# Patient Record
Sex: Female | Born: 1965 | Race: White | Hispanic: No | Marital: Married | Smoking: Never smoker
Health system: Southern US, Community
[De-identification: ages and names within clinical notes are randomized; demographics above are authoritative.]

## PROBLEM LIST (undated history)

## (undated) DIAGNOSIS — I10 Essential (primary) hypertension: Secondary | ICD-10-CM

## (undated) DIAGNOSIS — T7840XA Allergy, unspecified, initial encounter: Secondary | ICD-10-CM

## (undated) DIAGNOSIS — Z8619 Personal history of other infectious and parasitic diseases: Secondary | ICD-10-CM

## (undated) DIAGNOSIS — R9430 Abnormal result of cardiovascular function study, unspecified: Secondary | ICD-10-CM

## (undated) DIAGNOSIS — B977 Papillomavirus as the cause of diseases classified elsewhere: Secondary | ICD-10-CM

## (undated) DIAGNOSIS — E119 Type 2 diabetes mellitus without complications: Secondary | ICD-10-CM

## (undated) DIAGNOSIS — G4730 Sleep apnea, unspecified: Secondary | ICD-10-CM

## (undated) DIAGNOSIS — F988 Other specified behavioral and emotional disorders with onset usually occurring in childhood and adolescence: Secondary | ICD-10-CM

## (undated) DIAGNOSIS — F32A Depression, unspecified: Secondary | ICD-10-CM

## (undated) DIAGNOSIS — F329 Major depressive disorder, single episode, unspecified: Secondary | ICD-10-CM

## (undated) DIAGNOSIS — M8580 Other specified disorders of bone density and structure, unspecified site: Secondary | ICD-10-CM

## (undated) DIAGNOSIS — O24419 Gestational diabetes mellitus in pregnancy, unspecified control: Secondary | ICD-10-CM

## (undated) DIAGNOSIS — IMO0002 Reserved for concepts with insufficient information to code with codable children: Secondary | ICD-10-CM

## (undated) DIAGNOSIS — E785 Hyperlipidemia, unspecified: Secondary | ICD-10-CM

## (undated) DIAGNOSIS — J189 Pneumonia, unspecified organism: Secondary | ICD-10-CM

## (undated) DIAGNOSIS — R202 Paresthesia of skin: Secondary | ICD-10-CM

## (undated) DIAGNOSIS — R200 Anesthesia of skin: Secondary | ICD-10-CM

## (undated) HISTORY — DX: Essential (primary) hypertension: I10

## (undated) HISTORY — DX: Sleep apnea, unspecified: G47.30

## (undated) HISTORY — DX: Hyperlipidemia, unspecified: E78.5

## (undated) HISTORY — DX: Major depressive disorder, single episode, unspecified: F32.9

## (undated) HISTORY — DX: Papillomavirus as the cause of diseases classified elsewhere: B97.7

## (undated) HISTORY — DX: Anesthesia of skin: R20.0

## (undated) HISTORY — DX: Allergy, unspecified, initial encounter: T78.40XA

## (undated) HISTORY — DX: Anesthesia of skin: R20.2

## (undated) HISTORY — DX: Other specified behavioral and emotional disorders with onset usually occurring in childhood and adolescence: F98.8

## (undated) HISTORY — DX: Personal history of other infectious and parasitic diseases: Z86.19

## (undated) HISTORY — DX: Abnormal result of cardiovascular function study, unspecified: R94.30

## (undated) HISTORY — DX: Other specified disorders of bone density and structure, unspecified site: M85.80

## (undated) HISTORY — DX: Reserved for concepts with insufficient information to code with codable children: IMO0002

## (undated) HISTORY — DX: Depression, unspecified: F32.A

---

## 1988-09-30 HISTORY — PX: SHOULDER ARTHROSCOPY W/ ROTATOR CUFF REPAIR: SHX2400

## 1997-11-03 ENCOUNTER — Ambulatory Visit (HOSPITAL_COMMUNITY): Admission: RE | Admit: 1997-11-03 | Discharge: 1997-11-03 | Payer: Self-pay | Admitting: Obstetrics and Gynecology

## 1997-11-24 ENCOUNTER — Inpatient Hospital Stay (HOSPITAL_COMMUNITY): Admission: AD | Admit: 1997-11-24 | Discharge: 1997-11-24 | Payer: Self-pay | Admitting: Obstetrics and Gynecology

## 1997-11-25 ENCOUNTER — Inpatient Hospital Stay (HOSPITAL_COMMUNITY): Admission: AD | Admit: 1997-11-25 | Discharge: 1997-11-25 | Payer: Self-pay | Admitting: Obstetrics and Gynecology

## 1997-12-04 ENCOUNTER — Encounter: Admission: RE | Admit: 1997-12-04 | Discharge: 1998-03-04 | Payer: Self-pay | Admitting: Obstetrics and Gynecology

## 1997-12-10 ENCOUNTER — Inpatient Hospital Stay (HOSPITAL_COMMUNITY): Admission: AD | Admit: 1997-12-10 | Discharge: 1997-12-10 | Payer: Self-pay | Admitting: Obstetrics and Gynecology

## 1997-12-17 ENCOUNTER — Inpatient Hospital Stay (HOSPITAL_COMMUNITY): Admission: AD | Admit: 1997-12-17 | Discharge: 1997-12-21 | Payer: Self-pay | Admitting: Obstetrics and Gynecology

## 1997-12-21 ENCOUNTER — Encounter (HOSPITAL_COMMUNITY): Admission: RE | Admit: 1997-12-21 | Discharge: 1998-03-21 | Payer: Self-pay | Admitting: Obstetrics and Gynecology

## 1999-02-02 ENCOUNTER — Other Ambulatory Visit: Admission: RE | Admit: 1999-02-02 | Discharge: 1999-02-02 | Payer: Self-pay | Admitting: Obstetrics and Gynecology

## 1999-11-23 ENCOUNTER — Other Ambulatory Visit: Admission: RE | Admit: 1999-11-23 | Discharge: 1999-11-23 | Payer: Self-pay | Admitting: Obstetrics and Gynecology

## 2000-03-08 ENCOUNTER — Ambulatory Visit (HOSPITAL_COMMUNITY): Admission: RE | Admit: 2000-03-08 | Discharge: 2000-03-08 | Payer: Self-pay | Admitting: Obstetrics and Gynecology

## 2000-03-08 ENCOUNTER — Encounter: Payer: Self-pay | Admitting: Obstetrics and Gynecology

## 2000-05-15 ENCOUNTER — Inpatient Hospital Stay (HOSPITAL_COMMUNITY): Admission: AD | Admit: 2000-05-15 | Discharge: 2000-05-18 | Payer: Self-pay | Admitting: Obstetrics and Gynecology

## 2000-06-26 ENCOUNTER — Other Ambulatory Visit: Admission: RE | Admit: 2000-06-26 | Discharge: 2000-06-26 | Payer: Self-pay | Admitting: Obstetrics and Gynecology

## 2001-07-01 ENCOUNTER — Other Ambulatory Visit: Admission: RE | Admit: 2001-07-01 | Discharge: 2001-07-01 | Payer: Self-pay | Admitting: Obstetrics and Gynecology

## 2002-07-31 ENCOUNTER — Other Ambulatory Visit: Admission: RE | Admit: 2002-07-31 | Discharge: 2002-07-31 | Payer: Self-pay | Admitting: Obstetrics and Gynecology

## 2003-09-24 ENCOUNTER — Other Ambulatory Visit: Admission: RE | Admit: 2003-09-24 | Discharge: 2003-09-24 | Payer: Self-pay | Admitting: Obstetrics and Gynecology

## 2003-12-08 ENCOUNTER — Ambulatory Visit: Payer: Self-pay | Admitting: Internal Medicine

## 2003-12-23 ENCOUNTER — Ambulatory Visit: Payer: Self-pay | Admitting: Family Medicine

## 2004-02-02 ENCOUNTER — Ambulatory Visit: Payer: Self-pay | Admitting: Otolaryngology

## 2004-10-13 ENCOUNTER — Other Ambulatory Visit: Admission: RE | Admit: 2004-10-13 | Discharge: 2004-10-13 | Payer: Self-pay | Admitting: Obstetrics and Gynecology

## 2005-04-19 ENCOUNTER — Ambulatory Visit: Payer: Self-pay | Admitting: Family Medicine

## 2005-05-01 ENCOUNTER — Ambulatory Visit: Payer: Self-pay | Admitting: Family Medicine

## 2005-07-19 ENCOUNTER — Ambulatory Visit: Payer: Self-pay | Admitting: Family Medicine

## 2005-09-25 ENCOUNTER — Ambulatory Visit: Payer: Self-pay | Admitting: Family Medicine

## 2005-10-10 ENCOUNTER — Ambulatory Visit: Payer: Self-pay | Admitting: Family Medicine

## 2005-11-15 ENCOUNTER — Ambulatory Visit: Payer: Self-pay | Admitting: Family Medicine

## 2005-12-15 ENCOUNTER — Ambulatory Visit: Payer: Self-pay | Admitting: Family Medicine

## 2006-08-20 ENCOUNTER — Emergency Department (HOSPITAL_COMMUNITY): Admission: EM | Admit: 2006-08-20 | Discharge: 2006-08-20 | Payer: Self-pay | Admitting: Emergency Medicine

## 2006-08-20 IMAGING — CR DG CHEST 1V PORT
1 series · 1 of 1 positions shown · non-contrast
Comparison: None.

CLINICAL DATA: Chest pain, nausea, and chest tightness.
 PORTABLE CHEST - 1 VIEW:

[view not recorded]
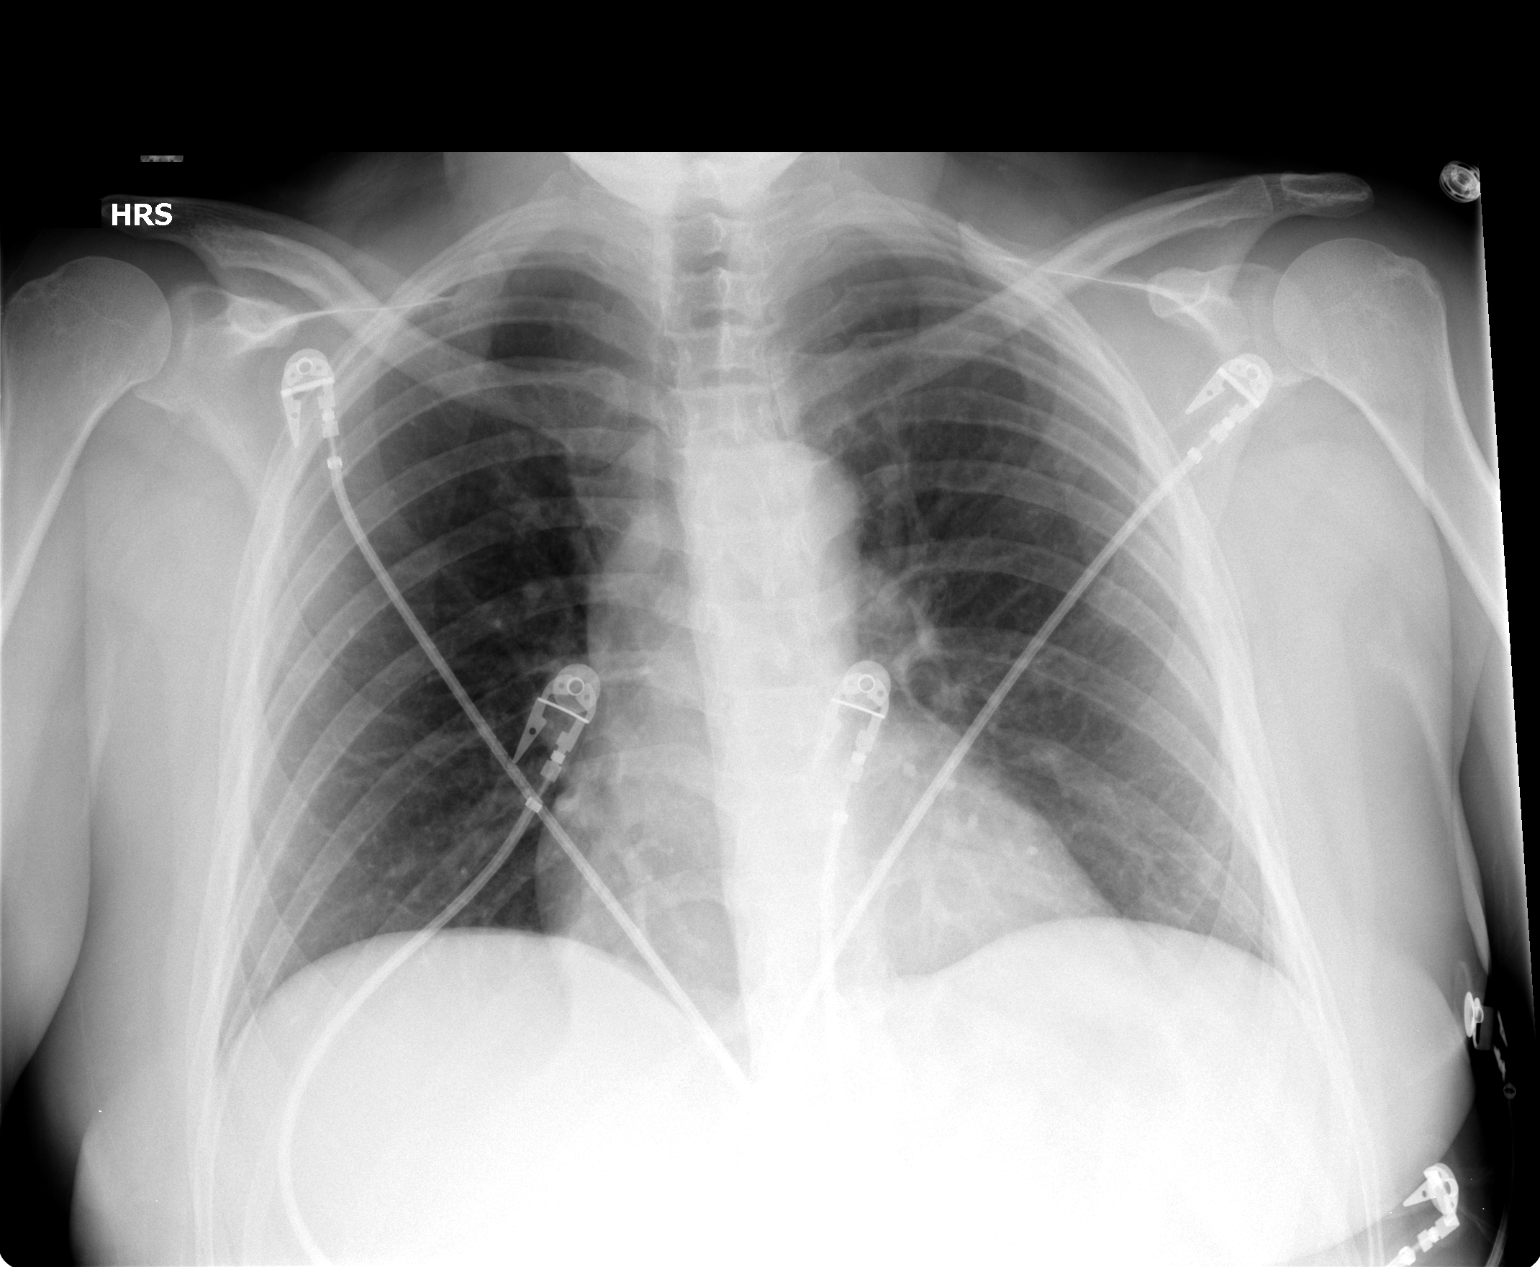

[1 of 1 positions shown; findings below may reference images not displayed]

FINDINGS: The trachea is midline. The heart size is accentuated by technique. The lungs are clear. No pleural fluid.
IMPRESSION: No acute findings.

## 2006-08-21 ENCOUNTER — Telehealth (INDEPENDENT_AMBULATORY_CARE_PROVIDER_SITE_OTHER): Payer: Self-pay | Admitting: Internal Medicine

## 2006-08-30 ENCOUNTER — Ambulatory Visit: Payer: Self-pay | Admitting: Cardiology

## 2006-08-30 LAB — CONVERTED CEMR LAB
Cholesterol: 238 mg/dL (ref 0–200)
Direct LDL: 162.1 mg/dL
HDL: 51.5 mg/dL (ref >39.0)
Total CHOL/HDL Ratio: 4.6
Triglycerides: 111 mg/dL (ref 0–149)
VLDL: 22 mg/dL (ref 0–40)

## 2006-08-31 ENCOUNTER — Ambulatory Visit: Payer: Self-pay

## 2006-08-31 ENCOUNTER — Encounter (INDEPENDENT_AMBULATORY_CARE_PROVIDER_SITE_OTHER): Payer: Self-pay | Admitting: Internal Medicine

## 2006-09-06 ENCOUNTER — Ambulatory Visit: Payer: Self-pay | Admitting: Cardiology

## 2006-09-25 ENCOUNTER — Ambulatory Visit: Payer: Self-pay | Admitting: Family Medicine

## 2006-09-25 DIAGNOSIS — F3289 Other specified depressive episodes: Secondary | ICD-10-CM | POA: Insufficient documentation

## 2006-09-25 DIAGNOSIS — J309 Allergic rhinitis, unspecified: Secondary | ICD-10-CM | POA: Insufficient documentation

## 2006-09-25 DIAGNOSIS — F329 Major depressive disorder, single episode, unspecified: Secondary | ICD-10-CM | POA: Insufficient documentation

## 2006-10-02 ENCOUNTER — Ambulatory Visit: Payer: Self-pay | Admitting: Licensed Clinical Social Worker

## 2006-10-30 ENCOUNTER — Encounter: Admission: RE | Admit: 2006-10-30 | Discharge: 2006-10-30 | Payer: Self-pay | Admitting: Obstetrics and Gynecology

## 2006-10-30 IMAGING — US UNKNOWN PR STUDY
1 series · 2 of 2 positions shown · non-contrast
Comparison: none

DG DIAGNOSTIC BILATERAL
Bilateral CC and MLO view(s) were taken.

RIGHT BREAST ULTRASOUND
DIGITAL BILATERAL DIAGNOSTIC MAMMOGRAM WITH CAD AND RIGHT BREAST ULTRASOUND:
CLINICAL DATA: Patient was feeling tenderness around the right nipple at the time of her recent 
physical exam.  Her physician palpated a lump at approximately 10-12 o'clock near the nipple.  The 
patient does not feel a lump today.

[Series 1: unknown pr study · 2 of 2 slices shown]
[im 1/2]
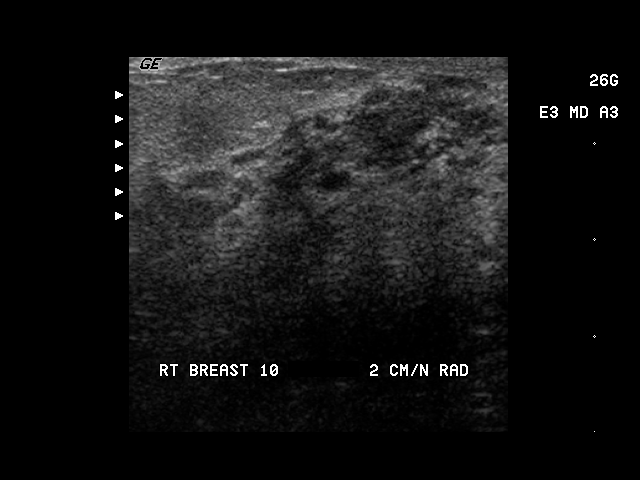
[im 2/2]
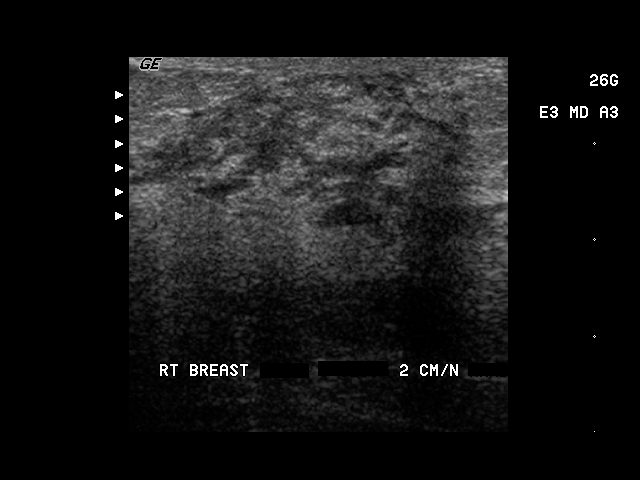

[2 of 2 positions shown; findings below may reference images not displayed]

heterogeneously dense.  There is no dominant mass, architectural distortion or calcification to 
suggest malignancy.

On physical examination, no mass is palpated in the right periareolar region from 10-12 o'clock.  
Sonography demonstrates dense fibroglandular tissue with no mass, distortion or shadowing to 
suggest malignancy.
IMPRESSION: No mammographic or sonographic evidence of malignancy.  Yearly screening mammography is suggested.

ASSESSMENT: Negative - BI-RADS 1

Screening mammogram of both breasts in 1 year.
ANALYZED BY COMPUTER AIDED DETECTION. ,

## 2006-10-30 IMAGING — MG MM DIAGNOSTIC BILATERAL
4 series · 4 of 4 positions shown · non-contrast
Comparison: none

DG DIAGNOSTIC BILATERAL
Bilateral CC and MLO view(s) were taken.

RIGHT BREAST ULTRASOUND
DIGITAL BILATERAL DIAGNOSTIC MAMMOGRAM WITH CAD AND RIGHT BREAST ULTRASOUND:
CLINICAL DATA: Patient was feeling tenderness around the right nipple at the time of her recent 
physical exam.  Her physician palpated a lump at approximately 10-12 o'clock near the nipple.  The 
patient does not feel a lump today.

[R CC]
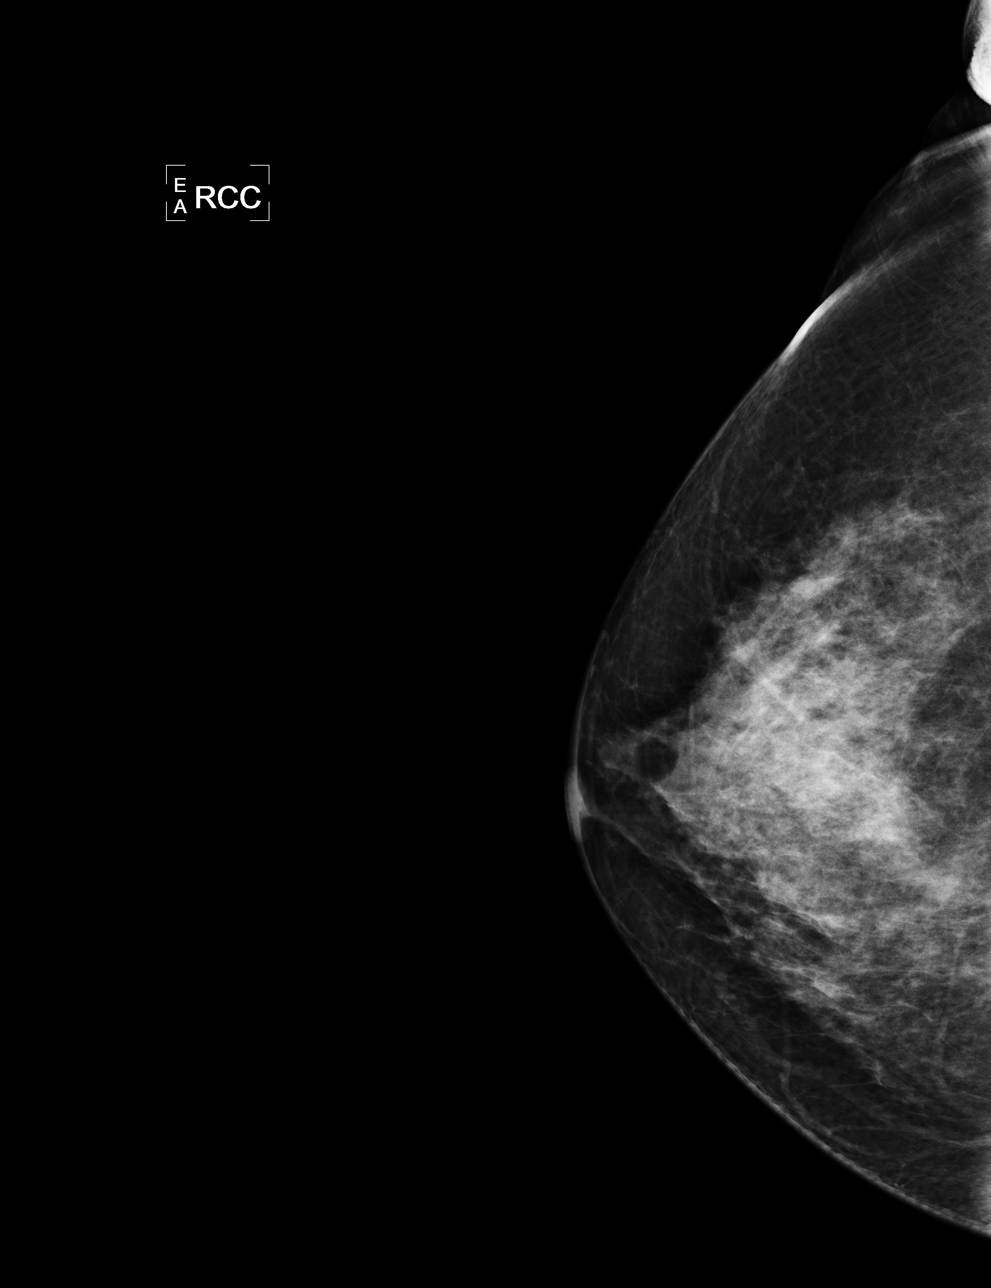

[L CC]
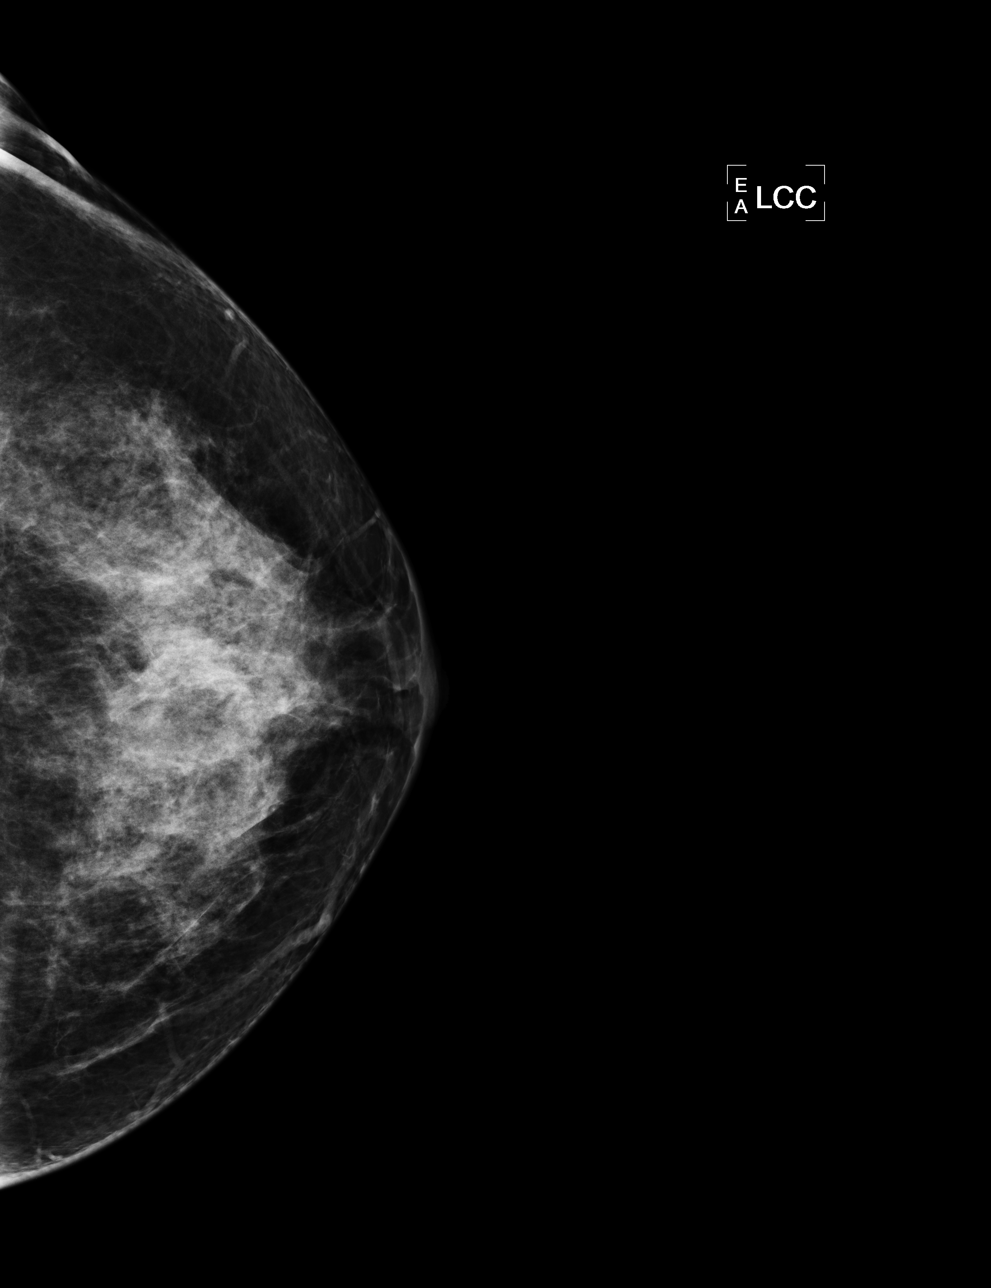

[L MLO]
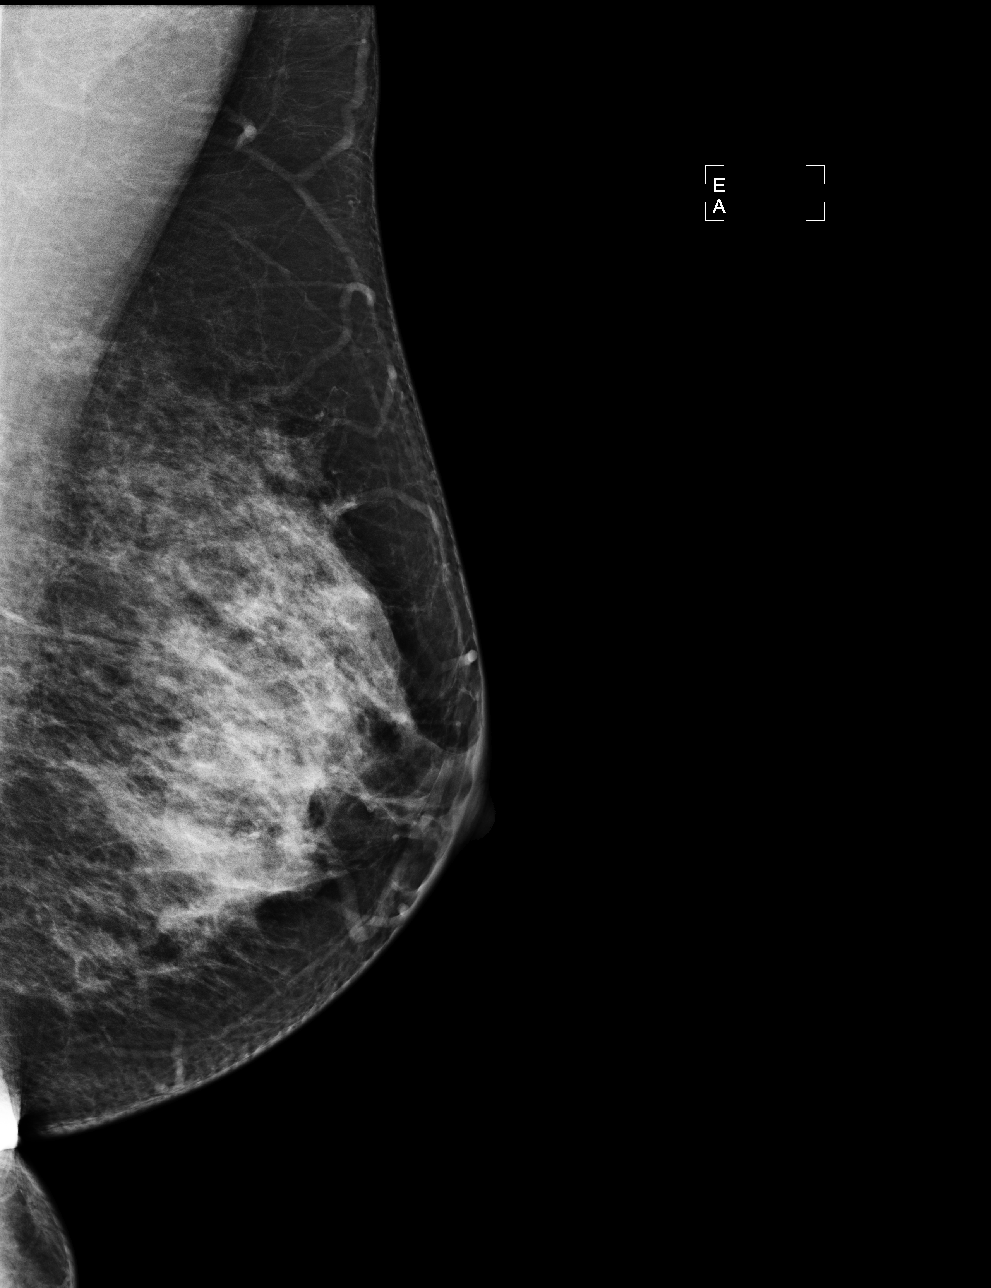

[R MLO]
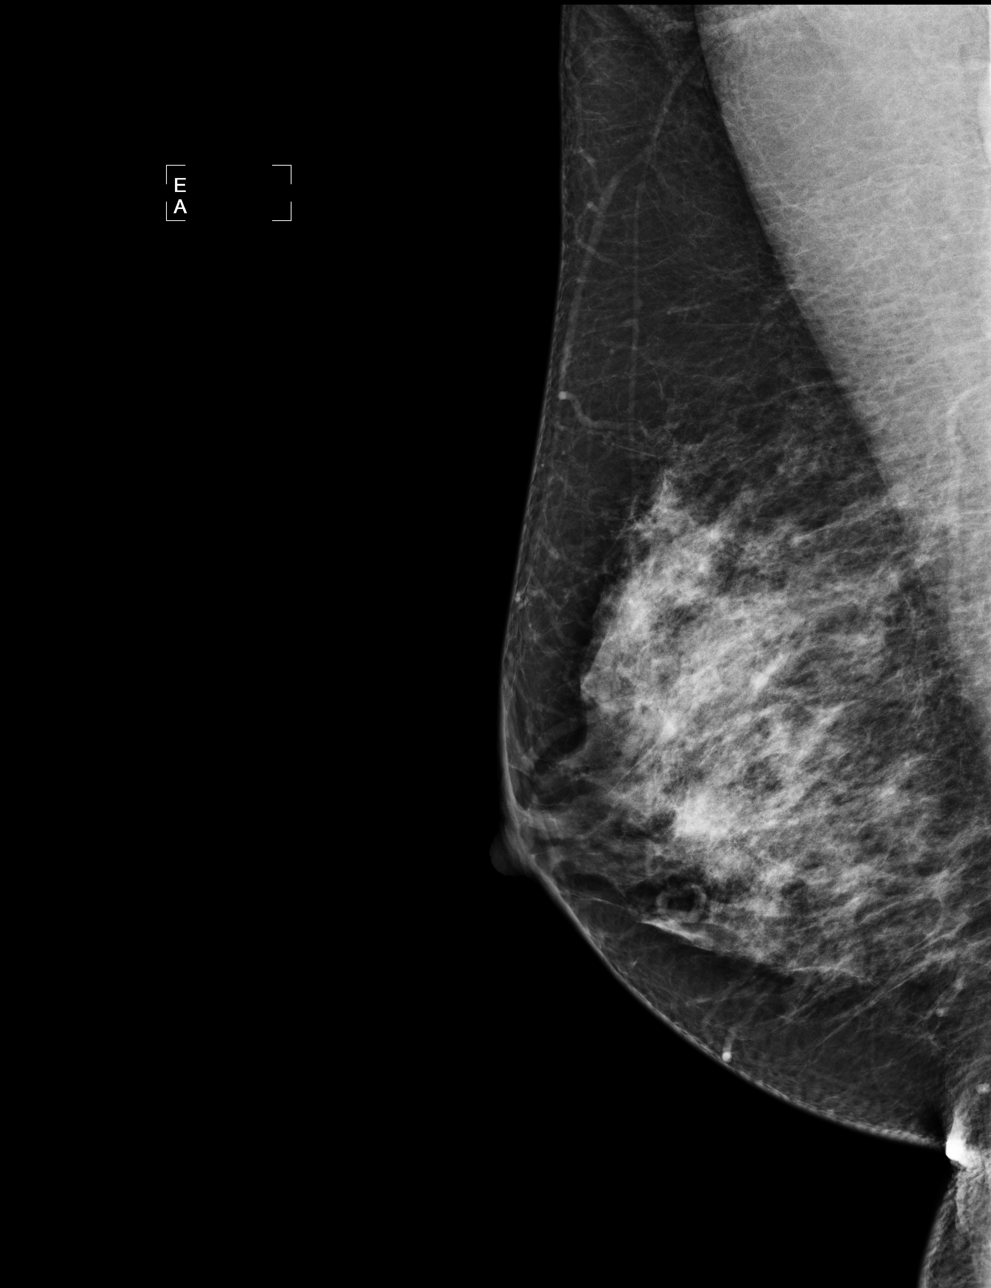

[4 of 4 positions shown; findings below may reference images not displayed]

heterogeneously dense.  There is no dominant mass, architectural distortion or calcification to 
suggest malignancy.

On physical examination, no mass is palpated in the right periareolar region from 10-12 o'clock.  
Sonography demonstrates dense fibroglandular tissue with no mass, distortion or shadowing to 
suggest malignancy.
IMPRESSION: No mammographic or sonographic evidence of malignancy.  Yearly screening mammography is suggested.

ASSESSMENT: Negative - BI-RADS 1

Screening mammogram of both breasts in 1 year.
ANALYZED BY COMPUTER AIDED DETECTION. ,

## 2006-12-26 ENCOUNTER — Ambulatory Visit: Payer: Self-pay | Admitting: Family Medicine

## 2006-12-26 ENCOUNTER — Encounter (INDEPENDENT_AMBULATORY_CARE_PROVIDER_SITE_OTHER): Payer: Self-pay | Admitting: Internal Medicine

## 2007-05-06 DIAGNOSIS — R195 Other fecal abnormalities: Secondary | ICD-10-CM | POA: Insufficient documentation

## 2007-10-15 ENCOUNTER — Encounter (INDEPENDENT_AMBULATORY_CARE_PROVIDER_SITE_OTHER): Payer: Self-pay | Admitting: Internal Medicine

## 2008-11-19 ENCOUNTER — Ambulatory Visit: Payer: Self-pay | Admitting: Family Medicine

## 2010-01-02 ENCOUNTER — Emergency Department (HOSPITAL_COMMUNITY)
Admission: EM | Admit: 2010-01-02 | Discharge: 2010-01-02 | Payer: Self-pay | Source: Home / Self Care | Admitting: Emergency Medicine

## 2010-01-02 IMAGING — CR DG CHEST 2V
2 series · 2 of 2 positions shown · non-contrast
Comparison: [DATE]

CLINICAL DATA: Shortness of breath.  Cough, cold and congestion.
Fevers.

CHEST - 2 VIEW

[w chest pa]
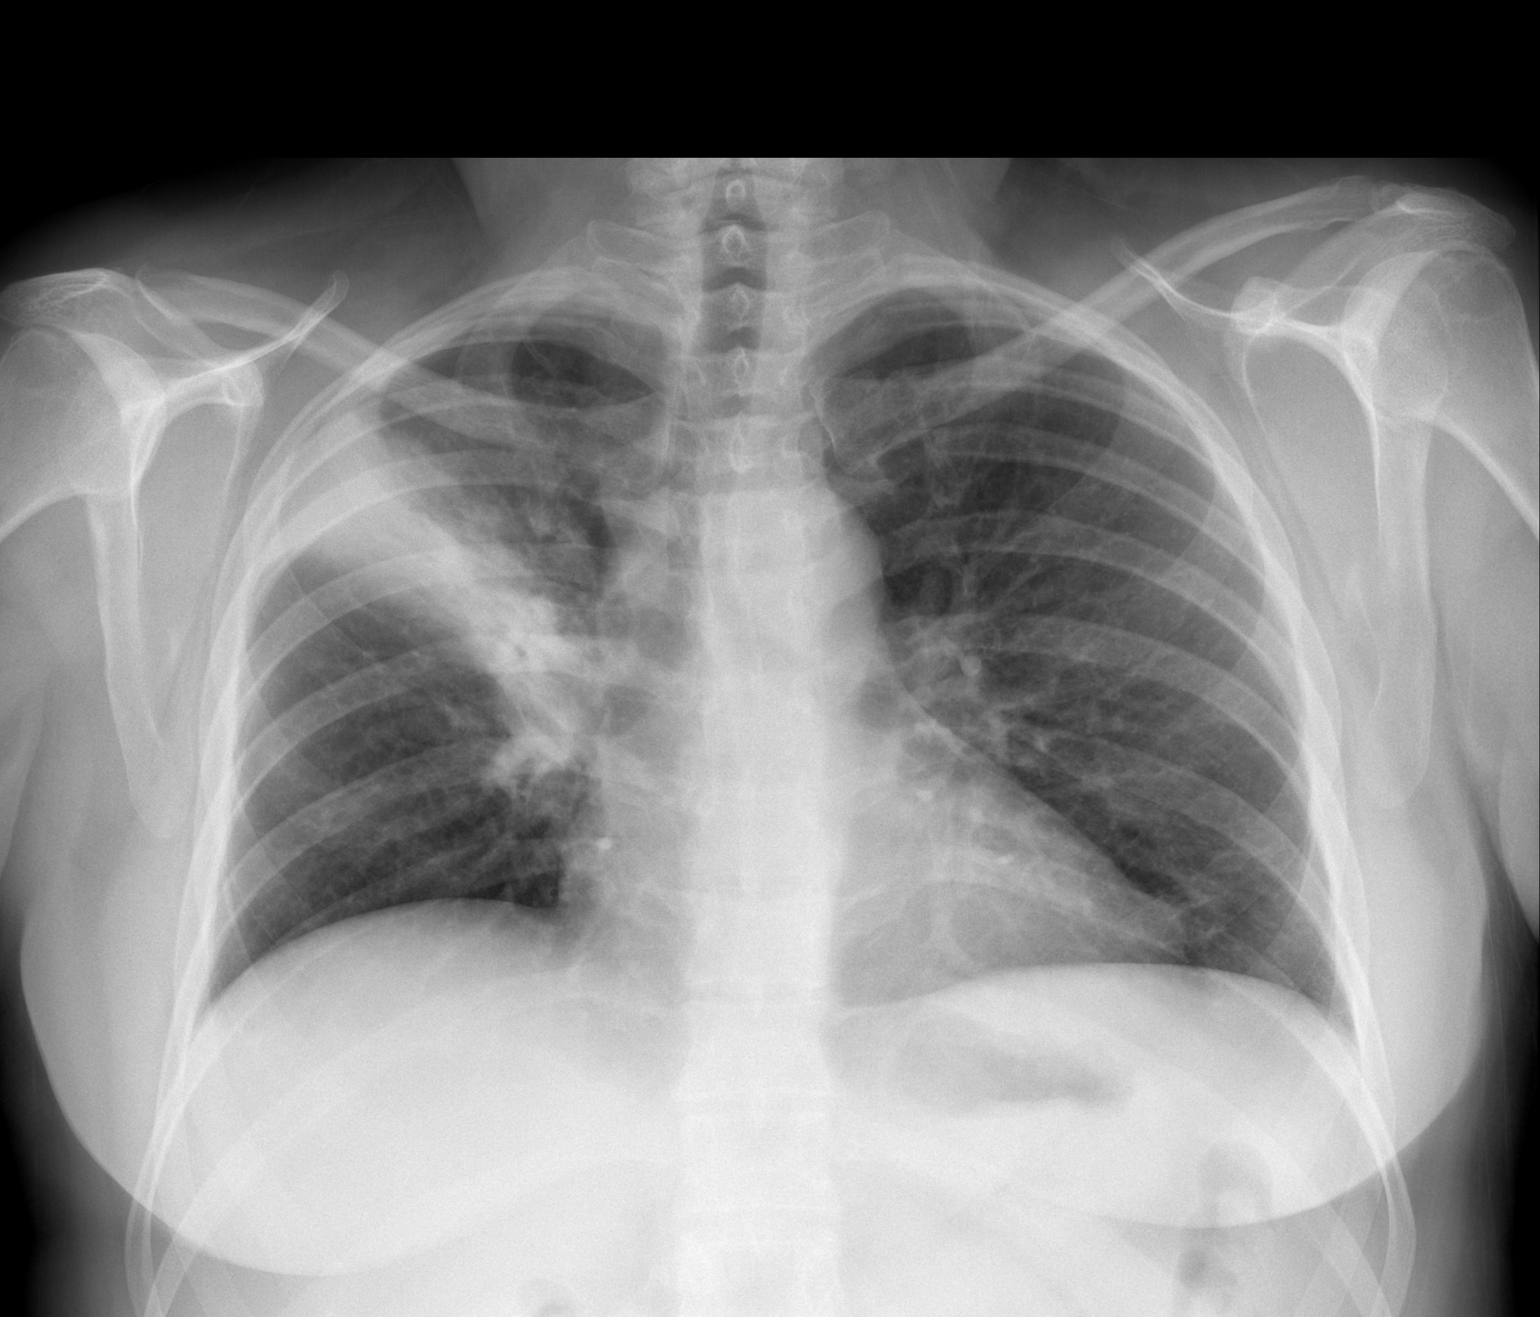

[w chest lat]
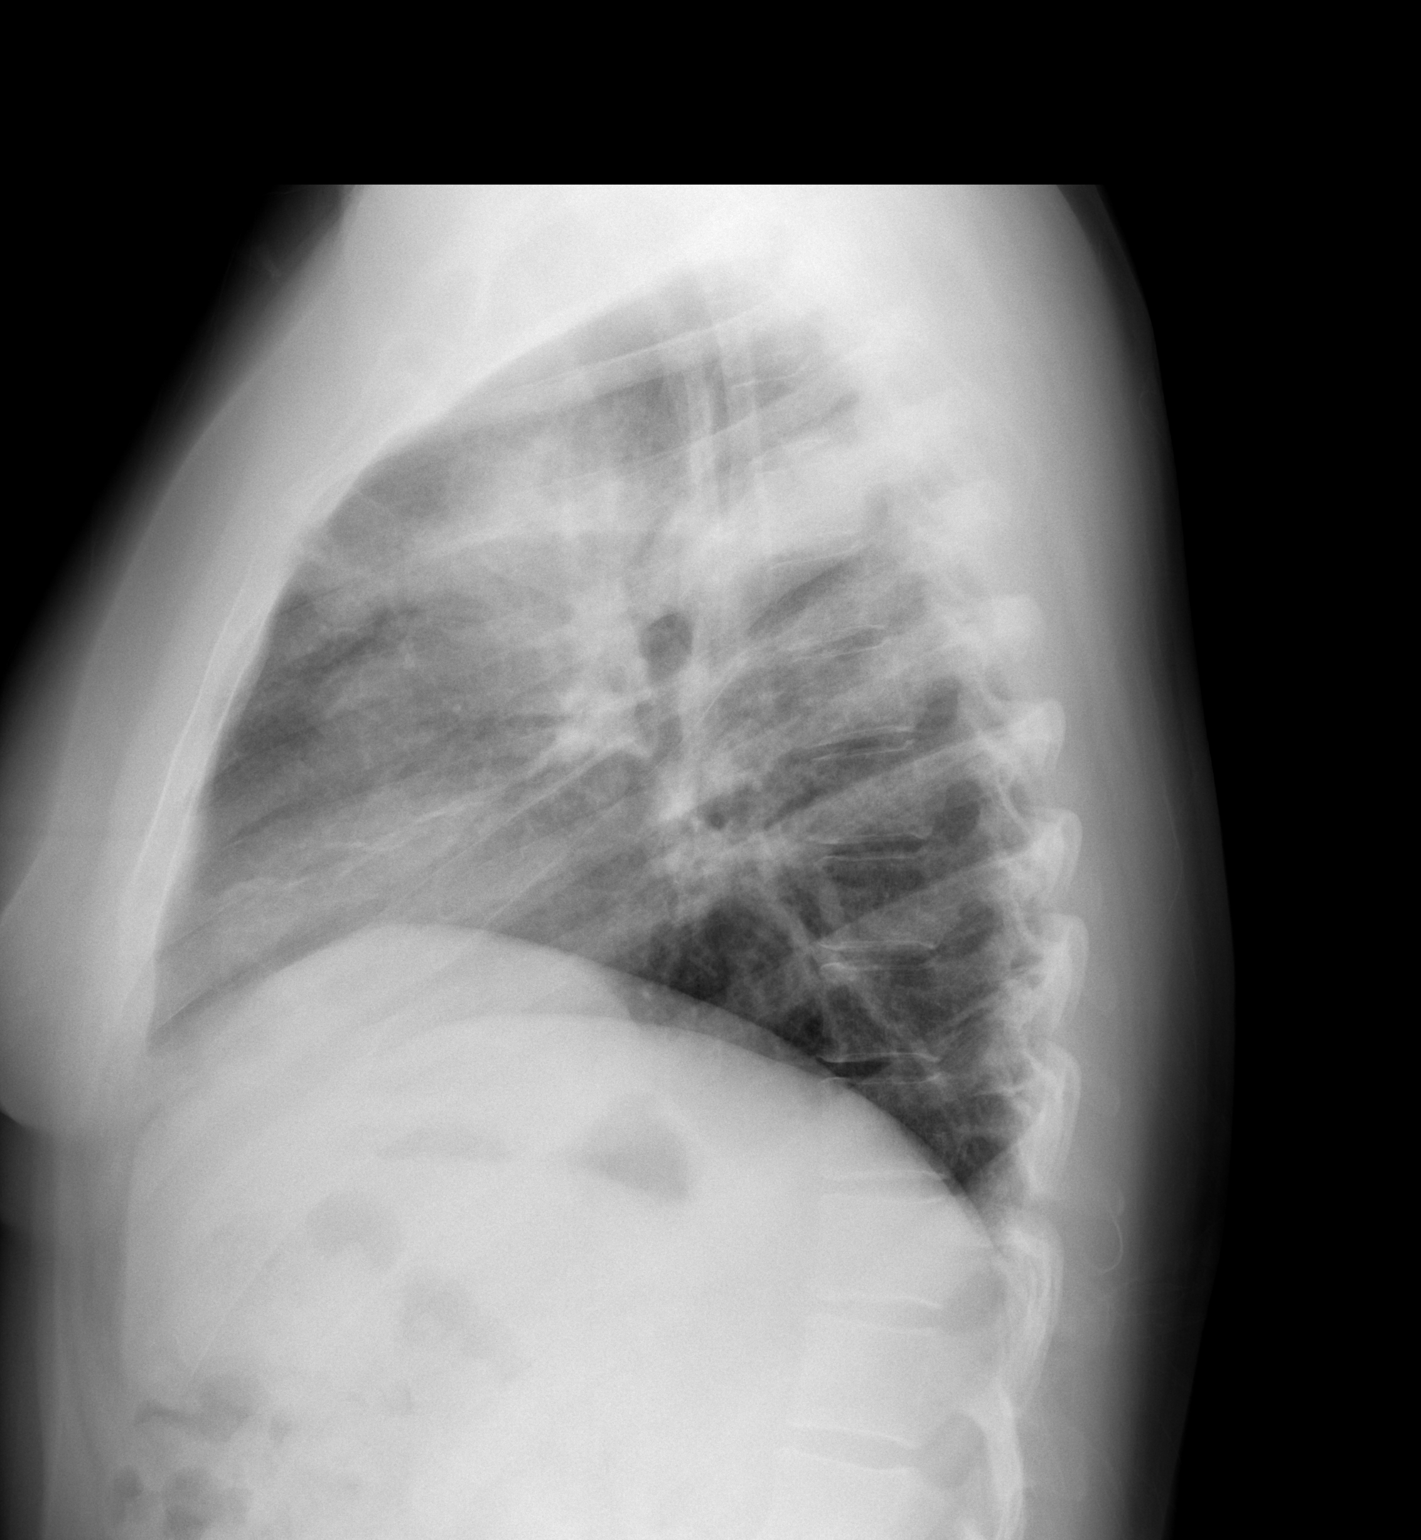

[2 of 2 positions shown; findings below may reference images not displayed]

FINDINGS: Heart size is normal.

No pleural effusion or pulmonary edema identified.

There is dense airspace consolidation identified within the right
upper lobe.

The left lung is clear.
IMPRESSION: 1.  Dense right upper lobe airspace consolidation consistent with
pneumonia.
2.  Suggest follow up imaging to ensure resolution.

## 2010-01-30 DIAGNOSIS — J189 Pneumonia, unspecified organism: Secondary | ICD-10-CM

## 2010-01-30 HISTORY — DX: Pneumonia, unspecified organism: J18.9

## 2010-03-03 NOTE — Progress Notes (Signed)
Summary: request your call  Phone Note Call from Patient Call back at Home Phone [phone] Call back at cell [phone]   Caller: Patient Call For: bean Summary of Call: pt want to speake with you, she went to er last night for cp. pt wants to know if you can refer her to her fathers cardiologist dr Myrtis Ser. Initial call taken by: Liane Comber,  August 21, 2006 9:47 AM  Follow-up for Phone Call        need her chart and get ER notes -------------------------- Was seen 7/21 /08 at [facility] for chest pain of unknown etiiology.  Has  increased risks of CAD due to family hx.    Can refer to Dr Myrtis Ser, cardio for eval -----------noted Follow-up by: Gildardo Griffes FNP,  August 21, 2006 1:40 PM  Additional Follow-up for Phone Call Additional follow up Details #1::        PT HAD ALREADY MADE HER OWN APPT WITH DR Myrtis Ser FOR AUGUST 27TH  AT 10:15. NO SOONER APPTS ARE AVAILABLE. ...................................................................Carlton Adam  August 22, 2006 11:23 AM  Additional Follow-up by: Carlton Adam,  August 22, 2006 11:23 AM

## 2010-03-03 NOTE — Assessment & Plan Note (Signed)
Summary: FLU SHOT/BILLIE/CLE  Nurse Visit    Prior Medications: ZYRTEC ALLERGY 10 MG  TABS (CETIRIZINE HCL) 1 qd Current Allergies: ! SULFA   Influenza Vaccine    Vaccine Type: Fluvax 3+    Site: left deltoid    Mfr: Sanofi Pasteur    Dose: 0.5 ml    Route: IM    Given by: Cooper Render    Exp. Date: 07/30/2007    Lot #: [id number]    VIS given: 08/23/06 version given December 26, 2006.  Flu Vaccine Consent Questions    Do you have a history of severe allergic reactions to this vaccine? no    Any prior history of allergic reactions to egg and/or gelatin? no    Do you have a sensitivity to the preservative Thimersol? no    Do you have a past history of Guillan-Barre Syndrome? no    Do you currently have an acute febrile illness? no    Have you ever had a severe reaction to latex? no    Vaccine information given and explained to patient? yes    Are you currently pregnant? no   Orders Added: 1)  Flu Vaccine 23yrs + [90658] 2)  Admin 1st Vaccine [email or web address]    ]

## 2010-03-03 NOTE — Assessment & Plan Note (Signed)
Summary: Lindsay Rios - FLU SHOT / LFW  Nurse Visit   Allergies: 1)  ! Sulfa  Immunizations Administered:  Influenza Vaccine # 1:    Vaccine Type: Fluvax 3+    Site: left deltoid    Mfr: GlaxoSmithKline    Dose: 0.5 ml    Route: IM    Given by: Mervin Hack CMA (AAMA)    Exp. Date: 07/29/2009    Lot #: [id number]    VIS given: 08/23/06 version given November 19, 2008.  Flu Vaccine Consent Questions:    Do you have a history of severe allergic reactions to this vaccine? no    Any prior history of allergic reactions to egg and/or gelatin? no    Do you have a sensitivity to the preservative Thimersol? no    Do you have a past history of Guillan-Barre Syndrome? no    Do you currently have an acute febrile illness? no    Have you ever had a severe reaction to latex? no    Vaccine information given and explained to patient? yes    Are you currently pregnant? no  Orders Added: 1)  Flu Vaccine 80yrs + [90658] 2)  Admin 1st Vaccine [08657]

## 2010-03-03 NOTE — Assessment & Plan Note (Signed)
Summary: JAW PAIN,THROAT/CLE   Vital Signs:  Patient Profile:   45 Years Old Female Weight:      158 pounds Temp:     98.3 degrees F oral Pulse rate:   67 / minute BP sitting:   143 / 106  (right arm)  Vitals Entered By: Cooper Render (September 25, 2006 12:24 PM)               Chief Complaint:  jaw pain and increased stress.  History of Present Illness: Here due to increased stress at home, marital problems since 2/08.  GYN prescribed Effexor 4-5/08 which made her drunk, so D/Ced.  Took Lexapro x 1 mo--too expensive.  Took Celexa  ~2-3wks, stopped due to mother's neg comments. " I'm about a the end of my rope."  Agrees to counseling.  Has regained most of the 20 lbs that she had talken off over the  last 6 mo or so.  Having pain in both jaws on and off.  Son told her that she is snoring loudly.  Feels like something in her throat.  Not aware of grinding her teeth, dentist did not comment last visit.  Has used Claritin in lthe past--helped.  Has been on Zyrtec most recently--helped.  Wants Rx--agreed to generic.  Understands that the insurance co may not cover.  Current Allergies (reviewed today): ! SULFA     Review of Systems      See HPI   Physical Exam  General:     alert, well-developed, well-nourished, and well-hydrated.   Head:     normocephalic and atraumatic.   Eyes:     pupils equal, pupils round, and no injection.   Ears:     TMs retracted wit increased fluid Nose:     boggy and edematus Mouth:     injected, no exudate Lungs:     normal respiratory effort, normal breath sounds, no crackles, and no wheezes.   Neurologic:     alert & oriented X3, sensation intact to light touch, and gait normal.   Skin:     turgor normal and color normal.   Axillary Nodes:     no R axillary adenopathy and no L axillary adenopathy.   Psych:     normally interactive, good eye contact, not anxious appearing, and not depressed appearing.      Impression &  Recommendations:  Problem # 1:  ALLERGIC RHINITIS (ICD-477.9) Assessment: New  Her updated medication list for this problem includes:    Zyrtec Allergy 10 Mg Tabs (Cetirizine hcl) .Marland Kitchen... 1 once daily--generic   Problem # 2:  DISORDER, DEPRESSIVE NEC (ICD-311) Assessment: New refer to Laruth Bouchard will discuss addition of med--I will be notified  Complete Medication List: 1)  Zyrtec Allergy 10 Mg Tabs (Cetirizine hcl) .Marland Kitchen.. 1 qd   Patient Instructions: 1)  refer to Judithe Modest    Prescriptions: ZYRTEC ALLERGY 10 MG  TABS (CETIRIZINE HCL) 1 qd  #30 x 6   Entered and Authorized by:   Gildardo Griffes FNP   Signed by:   Gildardo Griffes FNP on 09/25/2006   Method used:   Print then Give to Patient   RxID:   [id number]

## 2010-03-03 NOTE — Consult Note (Signed)
Summary: [facility] - OFFICE VISIT / DR. DAHARI BROOKS  [facility] - OFFICE VISIT / DR. DAHARI BROOKS   Imported By: Carin Primrose 10/17/2007 14:35:58  _____________________________________________________________________  External Attachment:    Type:   Image     Comment:   External Document

## 2010-03-03 NOTE — Miscellaneous (Signed)
  Clinical Lists Changes BP CK:  L) 119/90  P:  85

## 2010-06-14 NOTE — Assessment & Plan Note (Signed)
[facility]                            CARDIOLOGY OFFICE NOTE   NAME:WILLIAMSON, RAJVI ARMENTOR               MRN:          [id number]  DATE:09/06/2006                            DOB:          08-Feb-1965    Ms. Lindsay Rios was seen in consultation on August 30, 2006.  She had some  chest discomfort and was seen in the emergency room.  I take care of her  father, Mr. Jari Favre, who had a heart attack at a young age.  She has  high cholesterol.  She smoked in the past.  We decided to proceed with  exercise testing.  The patient had a stress Myoview scan on August 31, 2006.  She exercised for eight minutes.  There was no EKG change.  There  was no chest pain.  The nuclear images are normal.  There was no  evidence of scar or ischemia.  She had a fasting lipid profile.  Total  cholesterol was 238, triglycerides 111, HDL 51, LDL cholesterol 162.  The patient is now back for followup.  She is here with her husband.   She has not had any significant recurring pain.   PAST MEDICAL HISTORY:  All recorded in my note of August 30, 2006.   FAMILY HISTORY:  All recorded in my note of August 30, 2006.   SOCIAL HISTORY:  All recorded in my note of August 30, 2006.   I reviewed the data with the patient at length.   PHYSICAL EXAMINATION:  Today, blood pressure is 118/80 with a pulse of  73.  LUNGS:  Clear.  CARDIAC:  An S1 with an S2.  There are no clicks or significant murmurs.   PROBLEMS:  1. Strong family history of coronary disease.  2. A smoking history in the past.  3. Elevated cholesterol.  Her current LDL is 160.  4. Borderline blood pressure.  5. Chest discomfort.  I believe her chest discomfort was not cardiac      in origin.   I have strongly encouraged the patient to start an exercise program,  lose weight, and begin to watch her diet.  I have asked her to see  Everrett Coombe for early followup.  I have not started aspirin as of today,  but this can be  strongly considered.  I would strongly recommend that  the patient's LDL be watched very carefully and after the patient begins  vigorous exercise and weight loss, if her LDL is not brought down to  100, she should be started on a statin.   She does not need cardiology followup with me, but I will be happy to  see her at any time.     Luis Abed, MD, [facility]  Electronically Signed    JDK/MedQ  DD: 09/06/2006  DT: 09/06/2006  Job #: [id number]   cc:   Billie D. Bean, FNP

## 2010-06-14 NOTE — Assessment & Plan Note (Signed)
[facility]                            CARDIOLOGY OFFICE NOTE   NAME:WILLIAMSON, TONIA AVINO               MRN:          [id number]  DATE:08/30/2006                            DOB:          1965/07/08    Ms. Clinton Sawyer is referred for cardiac evaluation by Dr.  Effie Shy from the  [facility] Emergency Room. She had some chest discomfort and was seen in  the emergency room recently. There was no evidence of myocardial  infarction and she was encouraged to see a cardiologist. I take care of  her father, Mr. Luster Landsberg, who had an myocardial infarction at a young age.  She does have a history of a high cholesterol. She smoked in the past,  but quit. She is active. She is mildly overweight.   PAST MEDICAL HISTORY:   ALLERGIES:  No known drug allergies.   MEDICATIONS:  The patient is currently on no medications.   OTHER MEDICAL PROBLEMS:  See the list below.   SOCIAL HISTORY:  The patient is married with two children.   FAMILY HISTORY:  There is a positive family history of coronary disease.   REVIEW OF SYSTEMS:  The patient historically has had some allergies and  some constipation. There has also been some variation in her sugar over  time and some anxiety and depression. Otherwise, her review of systems  is negative.   PHYSICAL EXAMINATION:  Blood pressure 140/94 with a pulse of 80. The  patient is oriented to person, time and place. Affect is normal.  HEENT: Reveals no xanthelasma. She has normal extraocular motion. There  are no carotid bruits. There is no jugular venous distention.  LUNGS:  Are clear. Respiratory effort is not labored.  CARDIAC: Reveals an S1, with an S2. There are no clicks or significant  murmurs.  ABDOMEN: Soft. There are no masses or bruits.  There is no significant peripheral edema.   EKG reveals nonspecific ST-T wave changes that are very mild.   PROBLEM LIST:  1. Strong family history of coronary disease.  2. Smoking  history in the past.  3. Elevated cholesterol by history. She will have a fasting lipid      profile done today.  4. Borderline blood pressure.  5. * Recent chest discomfort. I am not convinced that this represented      significant ischemia. However, it is concerning with her family      history and her cholesterol history. We will      proceed with a fasting lipid profile and a stress Myoview scan and      then I will see her for followup.     Luis Abed, MD, [facility]  Electronically Signed    JDK/MedQ  DD: 08/30/2006  DT: 08/30/2006  Job #: [id number]   cc:   Billie D. Bean, FNP  [place]ham Jordan L. Effie Shy, M.D.

## 2010-06-17 NOTE — Discharge Summary (Signed)
[facility]  Patient:    Lindsay Rios, Lindsay Rios               MRN: [id number] Adm. Date:  [id number] Disc. Date: [id number] Attending:  Frederich Balding Dictator:   Danie Chandler, R.N.                           Discharge Summary  ADMISSION DIAGNOSIS:          Intrauterine pregnancy at 37-1/[redacted] weeks gestation and pregnancy-induced hypertension and favorable cervix.  DISCHARGE DIAGNOSIS:          Intrauterine pregnancy at 37-1/[redacted] weeks gestation and pregnancy-induced hypertension and favorable cervix with spontaneous vaginal delivery of a viable female infant with Apgars of 8 at one minute and 9 at five minutes over a second degree midline episiotomy on May 15, 2000, at 2343 hours.  PROCEDURES:                   On May 15, 2000, at 2343 hours, a spontaneous vaginal delivery of a viable female infant over a second degree midline episiotomy with Apgars of 8 at one minute and 9 at five minutes.  REASON FOR ADMISSION:         The patient is a 45 year old, gravida 2, para 1, with an estimated date of confinement of Jun 02, 2000, placing her at approximately 37-1/[redacted] weeks gestation.  The patient was sent to triage for increased blood pressure.  In triage, her blood pressure was 160-172/80-90 despite rest.  She was admitted with PIH.  HOSPITAL COURSE:              The patient had artificial rupture of membranes carried out, revealing clear fluid.  The patient denied headache, visual change, or right upper quadrant pain.  Her cervix was 2-3 cm, 50%, vertex, and -1.  Her deep tendon reflexes were 2+ without clonus.  The fetal heart rate was reactive.  The SGOT was 22 and SGPT 20.  Platelets 148,000.  HDL 148. Uric acid 6.9.  ___________.  The patient received Pitocin for augmentation of labor.  She was also started on magnesium.  She had a spontaneous vaginal delivery on May 15, 2000, at 2343 hours of a viable female infant with Apgars of 8 at one  minute and 9 at five minutes over a second degree midline episiotomy.  The patient was continued on magnesium sulfate and on postpartum day #1, she was without complaint.  Urine output was 325 cc/hr.  The deep tendon reflexes were 1+ without clonus.  Her labs were within normal limits.  She was continued on magnesium sulfate for 24 hours. On postpartum day #1, the patients blood pressures were 150-170/80-90.  Her urine output was good.  Deep tendon reflexes were w+ without clonus.  She was transferred to the mother/baby unit.  On postpartum day #3, her blood pressures were 152-162/82-90.  She denied headache, visual change, or right upper quadrant pain.  She was discharged home on this day.  CONDITION ON DISCHARGE:       Good.  DIET:                         Regular as tolerated.  ACTIVITY:                     As tolerated.  FOLLOW-UP:  She is to follow up in the office in one week for a blood pressure check and in six weeks for her postpartum exam.  She is to call for temperature greater than 100 degrees, persistent nausea or vomiting, heavy vaginal bleeding, and/or any signs or symptoms of PIH.  DISCHARGE MEDICATIONS:        1. Prenatal vitamins one p.o. q.d.                               2. Darvocet as directed by M.D. DD:  05/18/00 TD:  05/19/00 Job: [id number]

## 2010-09-13 ENCOUNTER — Ambulatory Visit: Payer: Self-pay | Admitting: Family Medicine

## 2010-11-14 LAB — I-STAT 8, (EC8 V) (CONVERTED LAB)
Acid-Base Excess: 1
BUN: 14
Bicarbonate: 26 — ABNORMAL HIGH
Chloride: 105
Glucose, Bld: 92
HCT: 42
Hemoglobin: 14.3
Operator id: 257131
Potassium: 3.7
Sodium: 139
TCO2: 27
pCO2, Ven: 41.1 — ABNORMAL LOW
pH, Ven: 7.409 — ABNORMAL HIGH

## 2010-11-14 LAB — POCT I-STAT CREATININE
Creatinine, Ser: 0.9
Operator id: 257131

## 2010-11-14 LAB — POCT CARDIAC MARKERS
CKMB, poc: <1 — ABNORMAL LOW
Myoglobin, poc: 45.5
Operator id: 257131
Troponin i, poc: <0.05

## 2010-11-14 LAB — D-DIMER, QUANTITATIVE: D-Dimer, Quant: <0.22

## 2011-01-31 HISTORY — PX: VAGINAL HYSTERECTOMY: SUR661

## 2011-03-31 ENCOUNTER — Other Ambulatory Visit: Payer: Self-pay | Admitting: Obstetrics and Gynecology

## 2011-03-31 DIAGNOSIS — R928 Other abnormal and inconclusive findings on diagnostic imaging of breast: Secondary | ICD-10-CM

## 2011-04-10 ENCOUNTER — Ambulatory Visit
Admission: RE | Admit: 2011-04-10 | Discharge: 2011-04-10 | Disposition: A | Discharge status: Home / Self Care | Payer: 59 | Source: Ambulatory Visit | Attending: Obstetrics and Gynecology | Admitting: Obstetrics and Gynecology

## 2011-04-10 DIAGNOSIS — R928 Other abnormal and inconclusive findings on diagnostic imaging of breast: Secondary | ICD-10-CM

## 2011-08-31 LAB — BASIC METABOLIC PANEL
BUN: 17 mg/dL (ref 4–21)
Creatinine: 0.8 mg/dL (ref 0.5–1.1)
Glucose: 95 mg/dL
Potassium: 3.9 mmol/L (ref 3.4–5.3)
Sodium: 140 mmol/L (ref 137–147)

## 2011-08-31 LAB — LIPID PANEL
Cholesterol: 156 mg/dL (ref 0–200)
HDL: 47 mg/dL (ref 35–70)
LDL Cholesterol: 79 mg/dL
Triglycerides: 150 mg/dL (ref 40–160)

## 2011-08-31 LAB — TSH: TSH: 0.75 u[IU]/mL (ref 0.41–5.90)

## 2011-08-31 LAB — HEPATIC FUNCTION PANEL
ALT: 19 U/L (ref 7–35)
AST: 15 U/L (ref 13–35)
Alkaline Phosphatase: 69 U/L (ref 25–125)
Bilirubin, Total: 0.6 mg/dL

## 2011-08-31 LAB — CBC AND DIFFERENTIAL
HCT: 42 % (ref 36–46)
Hemoglobin: 14.5 g/dL (ref 12.0–16.0)
Platelets: 301 10*3/uL (ref 150–399)
WBC: 10 10^3/mL

## 2012-05-15 ENCOUNTER — Encounter: Payer: Self-pay | Admitting: *Deleted

## 2012-05-23 ENCOUNTER — Encounter: Payer: Self-pay | Admitting: Family Medicine

## 2012-05-23 ENCOUNTER — Ambulatory Visit (INDEPENDENT_AMBULATORY_CARE_PROVIDER_SITE_OTHER): Payer: 59 | Admitting: Family Medicine

## 2012-05-23 VITALS — BP 128/86 | HR 71 | Ht 62.0 in | Wt 155.0 lb

## 2012-05-23 DIAGNOSIS — Z23 Encounter for immunization: Secondary | ICD-10-CM

## 2012-05-23 DIAGNOSIS — Z0000 Encounter for general adult medical examination without abnormal findings: Secondary | ICD-10-CM

## 2012-05-23 LAB — POCT URINALYSIS DIPSTICK
Bilirubin, UA: NEGATIVE
Blood, UA: NEGATIVE
Glucose, UA: NEGATIVE
Ketones, UA: NEGATIVE
Leukocytes, UA: NEGATIVE
Nitrite, UA: NEGATIVE
Protein, UA: NEGATIVE
Spec Grav, UA: <=1.005
Urobilinogen, UA: NEGATIVE
pH, UA: 7

## 2012-05-23 MED ORDER — TETANUS-DIPHTH-ACELL PERTUSSIS 5-2.5-18.5 LF-MCG/0.5 IM SUSP
0.5000 mL | Freq: Once | INTRAMUSCULAR | Status: AC
  Administered 2012-05-23: 0.5 mL via INTRAMUSCULAR

## 2012-05-23 NOTE — Progress Notes (Signed)
Subjective:     Patient ID: Lindsay Rios, female   DOB: 05-Jun-1965, 46 y.o.   MRN: [id number]  HPI Lindsay Rios is here today for her annual CPE.  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  She has done well since her last visit.  She has a follow up appointment with her GYN for a pap. She is doing fine on all of her medications.       Review of Systems  Constitutional: Negative for activity change, appetite change, fatigue and unexpected weight change.  HENT: Negative for hearing loss, ear pain, congestion, trouble swallowing, neck pain, dental problem and voice change.   Eyes: Negative for pain, redness and visual disturbance.  Respiratory: Negative for cough and shortness of breath.   Cardiovascular: Negative for chest pain, palpitations and leg swelling.  Gastrointestinal: Negative for nausea, vomiting, abdominal pain, diarrhea, constipation and blood in stool.  Endocrine: Negative for cold intolerance, heat intolerance, polydipsia, polyphagia and polyuria.  Genitourinary: Negative for dysuria, urgency, frequency, hematuria, vaginal discharge and pelvic pain.  Musculoskeletal: Negative for myalgias, back pain, joint swelling and arthralgias.  Skin: Negative for rash.  Neurological: Negative for dizziness, weakness and headaches.  Hematological: Negative for adenopathy. Does not bruise/bleed easily.  Psychiatric/Behavioral: Negative for sleep disturbance, dysphoric mood and decreased concentration. The patient is not nervous/anxious.    Past Medical History  Diagnosis Date  . Hypertension   . Depression   . Hyperlipidemia   . ADD (attention deficit disorder)   . Hx gestational diabetes   . Osteopenia   . 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 Hyperlipidemia Mother   . Hyperlipidemia Father   . Hypertension Father   . Diabetes Father   . Heart disease Father   . Cancer Maternal Grandmother      Colon Cancer  . Alzheimer's disease Maternal Grandmother    History   Social History Narrative   Marital Status: Separated Psychologist, sport and exercise) Significant Other Secretary/administrator)    Children:  Son (1) Daughter (1)    Pets: Dogs (2), Cat (1)    Living Situation: Lives with children.   Occupation: Full- Time Student    Education: [employer] (CMA Program)    Tobacco Use: She has never smoked.     Alcohol Use:  Rarely   Drug Use:  None   Diet:  Regular   Exercise:  None   Hobbies: Shopping             Objective:   Physical Exam  Constitutional: She is oriented to person, place, and time. She appears well-developed and well-nourished.  HENT:  Head: Normocephalic and atraumatic.  Right Ear: External ear normal.  Left Ear: External ear normal.  Nose: Nose normal.  Mouth/Throat: Oropharynx is clear and moist.  Eyes: Conjunctivae and EOM are normal. Pupils are equal, round, and reactive to light.  Neck: Normal range of motion. No thyromegaly present.  Cardiovascular: Normal rate, regular rhythm, normal heart sounds and intact distal pulses.  Exam reveals no gallop and no friction rub.   No murmur heard. Pulmonary/Chest: Effort normal and breath sounds normal.  Abdominal: Soft. Bowel sounds are normal.  Musculoskeletal: Normal range of motion. She exhibits no edema and no tenderness.  Lymphadenopathy:    She has no cervical adenopathy.  Neurological: She is alert and oriented to person, place, and time. She has normal reflexes.  Skin: Skin is warm and dry.  Psychiatric: She has a normal mood and affect. Her behavior is normal. Judgment and thought content normal.       Assessment:     Well Woman Exam    Plan:     1)  Tdap was given. 2)  PPD will be placed on Monday.

## 2012-05-23 NOTE — Patient Instructions (Addendum)
Preventive Care for Adults, Female A healthy lifestyle and preventive care can promote health and wellness. Preventive health guidelines for women include the following key practices.  A routine yearly physical is a good way to check with your caregiver about your health and preventive screening. It is a chance to share any concerns and updates on your health, and to receive a thorough exam.  Visit your dentist for a routine exam and preventive care every 6 months. Brush your teeth twice a day and floss once a day. Good oral hygiene prevents tooth decay and gum disease.  The frequency of eye exams is based on your age, health, family medical history, use of contact lenses, and other factors. Follow your caregiver's recommendations for frequency of eye exams.  Eat a healthy diet. Foods like vegetables, fruits, whole grains, low-fat dairy products, and lean protein foods contain the nutrients you need without too many calories. Decrease your intake of foods high in solid fats, added sugars, and salt. Eat the right amount of calories for you.Get information about a proper diet from your caregiver, if necessary.  Regular physical exercise is one of the most important things you can do for your health. Most adults should get at least 150 minutes of moderate-intensity exercise (any activity that increases your heart rate and causes you to sweat) each week. In addition, most adults need muscle-strengthening exercises on 2 or more days a week.  Maintain a healthy weight. The body mass index (BMI) is a screening tool to identify possible weight problems. It provides an estimate of body fat based on height and weight. Your caregiver can help determine your BMI, and can help you achieve or maintain a healthy weight.For adults 20 years and older:  A BMI below 18.5 is considered underweight.  A BMI of 18.5 to 24.9 is normal.  A BMI of 25 to 29.9 is considered overweight.  A BMI of 30 and above is  considered obese.  Maintain normal blood lipids and cholesterol levels by exercising and minimizing your intake of saturated fat. Eat a balanced diet with plenty of fruit and vegetables. Blood tests for lipids and cholesterol should begin at age 20 and be repeated every 5 years. If your lipid or cholesterol levels are high, you are over 50, or you are at high risk for heart disease, you may need your cholesterol levels checked more frequently.Ongoing high lipid and cholesterol levels should be treated with medicines if diet and exercise are not effective.  If you smoke, find out from your caregiver how to quit. If you do not use tobacco, do not start.  If you are pregnant, do not drink alcohol. If you are breastfeeding, be very cautious about drinking alcohol. If you are not pregnant and choose to drink alcohol, do not exceed 1 drink per day. One drink is considered to be 12 ounces (355 mL) of beer, 5 ounces (148 mL) of wine, or 1.5 ounces (44 mL) of liquor.  Avoid use of street drugs. Do not share needles with anyone. Ask for help if you need support or instructions about stopping the use of drugs.  High blood pressure causes heart disease and increases the risk of stroke. Your blood pressure should be checked at least every 1 to 2 years. Ongoing high blood pressure should be treated with medicines if weight loss and exercise are not effective.  If you are 55 to 45 years old, ask your caregiver if you should take aspirin to prevent strokes.  Diabetes   screening involves taking a blood sample to check your fasting blood sugar level. This should be done once every 3 years, after age 45, if you are within normal weight and without risk factors for diabetes. Testing should be considered at a younger age or be carried out more frequently if you are overweight and have at least 1 risk factor for diabetes.  Breast cancer screening is essential preventive care for women. You should practice "breast  self-awareness." This means understanding the normal appearance and feel of your breasts and may include breast self-examination. Any changes detected, no matter how small, should be reported to a caregiver. Women in their 20s and 30s should have a clinical breast exam (CBE) by a caregiver as part of a regular health exam every 1 to 3 years. After age 40, women should have a CBE every year. Starting at age 40, women should consider having a mammography (breast X-ray test) every year. Women who have a family history of breast cancer should talk to their caregiver about genetic screening. Women at a high risk of breast cancer should talk to their caregivers about having magnetic resonance imaging (MRI) and a mammography every year.  The Pap test is a screening test for cervical cancer. A Pap test can show cell changes on the cervix that might become cervical cancer if left untreated. A Pap test is a procedure in which cells are obtained and examined from the lower end of the uterus (cervix).  Women should have a Pap test starting at age 21.  Between ages 21 and 29, Pap tests should be repeated every 2 years.  Beginning at age 30, you should have a Pap test every 3 years as long as the past 3 Pap tests have been normal.  Some women have medical problems that increase the chance of getting cervical cancer. Talk to your caregiver about these problems. It is especially important to talk to your caregiver if a new problem develops soon after your last Pap test. In these cases, your caregiver may recommend more frequent screening and Pap tests.  The above recommendations are the same for women who have or have not gotten the vaccine for human papillomavirus (HPV).  If you had a hysterectomy for a problem that was not cancer or a condition that could lead to cancer, then you no longer need Pap tests. Even if you no longer need a Pap test, a regular exam is a good idea to make sure no other problems are  starting.  If you are between ages 65 and 70, and you have had normal Pap tests going back 10 years, you no longer need Pap tests. Even if you no longer need a Pap test, a regular exam is a good idea to make sure no other problems are starting.  If you have had past treatment for cervical cancer or a condition that could lead to cancer, you need Pap tests and screening for cancer for at least 20 years after your treatment.  If Pap tests have been discontinued, risk factors (such as a new sexual partner) need to be reassessed to determine if screening should be resumed.  The HPV test is an additional test that may be used for cervical cancer screening. The HPV test looks for the virus that can cause the cell changes on the cervix. The cells collected during the Pap test can be tested for HPV. The HPV test could be used to screen women aged 30 years and older, and should   be used in women of any age who have unclear Pap test results. After the age of 30, women should have HPV testing at the same frequency as a Pap test.  Colorectal cancer can be detected and often prevented. Most routine colorectal cancer screening begins at the age of 50 and continues through age 75. However, your caregiver may recommend screening at an earlier age if you have risk factors for colon cancer. On a yearly basis, your caregiver may provide home test kits to check for hidden blood in the stool. Use of a small camera at the end of a tube, to directly examine the colon (sigmoidoscopy or colonoscopy), can detect the earliest forms of colorectal cancer. Talk to your caregiver about this at age 50, when routine screening begins. Direct examination of the colon should be repeated every 5 to 10 years through age 75, unless early forms of pre-cancerous polyps or small growths are found.  Hepatitis C blood testing is recommended for all people born from 1945 through 1965 and any individual with known risks for hepatitis C.  Practice  safe sex. Use condoms and avoid high-risk sexual practices to reduce the spread of sexually transmitted infections (STIs). STIs include gonorrhea, chlamydia, syphilis, trichomonas, herpes, HPV, and human immunodeficiency virus (HIV). Herpes, HIV, and HPV are viral illnesses that have no cure. They can result in disability, cancer, and death. Sexually active women aged 25 and younger should be checked for chlamydia. Older women with new or multiple partners should also be tested for chlamydia. Testing for other STIs is recommended if you are sexually active and at increased risk.  Osteoporosis is a disease in which the bones lose minerals and strength with aging. This can result in serious bone fractures. The risk of osteoporosis can be identified using a bone density scan. Women ages 65 and over and women at risk for fractures or osteoporosis should discuss screening with their caregivers. Ask your caregiver whether you should take a calcium supplement or vitamin D to reduce the rate of osteoporosis.  Menopause can be associated with physical symptoms and risks. Hormone replacement therapy is available to decrease symptoms and risks. You should talk to your caregiver about whether hormone replacement therapy is right for you.  Use sunscreen with sun protection factor (SPF) of 30 or more. Apply sunscreen liberally and repeatedly throughout the day. You should seek shade when your shadow is shorter than you. Protect yourself by wearing long sleeves, pants, a wide-brimmed hat, and sunglasses year round, whenever you are outdoors.  Once a month, do a whole body skin exam, using a mirror to look at the skin on your back. Notify your caregiver of new moles, moles that have irregular borders, moles that are larger than a pencil eraser, or moles that have changed in shape or color.  Stay current with required immunizations.  Influenza. You need a dose every fall (or winter). The composition of the flu vaccine  changes each year, so being vaccinated once is not enough.  Pneumococcal polysaccharide. You need 1 to 2 doses if you smoke cigarettes or if you have certain chronic medical conditions. You need 1 dose at age 65 (or older) if you have never been vaccinated.  Tetanus, diphtheria, pertussis (Tdap, Td). Get 1 dose of Tdap vaccine if you are younger than age 65, are over 65 and have contact with an infant, are a healthcare worker, are pregnant, or simply want to be protected from whooping cough. After that, you need a Td   booster dose every 10 years. Consult your caregiver if you have not had at least 3 tetanus and diphtheria-containing shots sometime in your life or have a deep or dirty wound.  HPV. You need this vaccine if you are a woman age 26 or younger. The vaccine is given in 3 doses over 6 months.  Measles, mumps, rubella (MMR). You need at least 1 dose of MMR if you were born in 1957 or later. You may also need a second dose.  Meningococcal. If you are age 19 to 21 and a first-year college student living in a residence hall, or have one of several medical conditions, you need to get vaccinated against meningococcal disease. You may also need additional booster doses.  Zoster (shingles). If you are age 60 or older, you should get this vaccine.  Varicella (chickenpox). If you have never had chickenpox or you were vaccinated but received only 1 dose, talk to your caregiver to find out if you need this vaccine.  Hepatitis A. You need this vaccine if you have a specific risk factor for hepatitis A virus infection or you simply wish to be protected from this disease. The vaccine is usually given as 2 doses, 6 to 18 months apart.  Hepatitis B. You need this vaccine if you have a specific risk factor for hepatitis B virus infection or you simply wish to be protected from this disease. The vaccine is given in 3 doses, usually over 6 months. Preventive Services / Frequency Ages 19 to 39  Blood  pressure check.** / Every 1 to 2 years.  Lipid and cholesterol check.** / Every 5 years beginning at age 20.  Clinical breast exam.** / Every 3 years for women in their 20s and 30s.  Pap test.** / Every 2 years from ages 21 through 29. Every 3 years starting at age 30 through age 65 or 70 with a history of 3 consecutive normal Pap tests.  HPV screening.** / Every 3 years from ages 30 through ages 65 to 70 with a history of 3 consecutive normal Pap tests.  Hepatitis C blood test.** / For any individual with known risks for hepatitis C.  Skin self-exam. / Monthly.  Influenza immunization.** / Every year.  Pneumococcal polysaccharide immunization.** / 1 to 2 doses if you smoke cigarettes or if you have certain chronic medical conditions.  Tetanus, diphtheria, pertussis (Tdap, Td) immunization. / A one-time dose of Tdap vaccine. After that, you need a Td booster dose every 10 years.  HPV immunization. / 3 doses over 6 months, if you are 26 and younger.  Measles, mumps, rubella (MMR) immunization. / You need at least 1 dose of MMR if you were born in 1957 or later. You may also need a second dose.  Meningococcal immunization. / 1 dose if you are age 19 to 21 and a first-year college student living in a residence hall, or have one of several medical conditions, you need to get vaccinated against meningococcal disease. You may also need additional booster doses.  Varicella immunization.** / Consult your caregiver.  Hepatitis A immunization.** / Consult your caregiver. 2 doses, 6 to 18 months apart.  Hepatitis B immunization.** / Consult your caregiver. 3 doses usually over 6 months. Ages 40 to 64  Blood pressure check.** / Every 1 to 2 years.  Lipid and cholesterol check.** / Every 5 years beginning at age 20.  Clinical breast exam.** / Every year after age 40.  Mammogram.** / Every year beginning at age 40   and continuing for as long as you are in good health. Consult with your  caregiver.  Pap test.** / Every 3 years starting at age 30 through age 65 or 70 with a history of 3 consecutive normal Pap tests.  HPV screening.** / Every 3 years from ages 30 through ages 65 to 70 with a history of 3 consecutive normal Pap tests.  Fecal occult blood test (FOBT) of stool. / Every year beginning at age 50 and continuing until age 75. You may not need to do this test if you get a colonoscopy every 10 years.  Flexible sigmoidoscopy or colonoscopy.** / Every 5 years for a flexible sigmoidoscopy or every 10 years for a colonoscopy beginning at age 50 and continuing until age 75.  Hepatitis C blood test.** / For all people born from 1945 through 1965 and any individual with known risks for hepatitis C.  Skin self-exam. / Monthly.  Influenza immunization.** / Every year.  Pneumococcal polysaccharide immunization.** / 1 to 2 doses if you smoke cigarettes or if you have certain chronic medical conditions.  Tetanus, diphtheria, pertussis (Tdap, Td) immunization.** / A one-time dose of Tdap vaccine. After that, you need a Td booster dose every 10 years.  Measles, mumps, rubella (MMR) immunization. / You need at least 1 dose of MMR if you were born in 1957 or later. You may also need a second dose.  Varicella immunization.** / Consult your caregiver.  Meningococcal immunization.** / Consult your caregiver.  Hepatitis A immunization.** / Consult your caregiver. 2 doses, 6 to 18 months apart.  Hepatitis B immunization.** / Consult your caregiver. 3 doses, usually over 6 months. Ages 65 and over  Blood pressure check.** / Every 1 to 2 years.  Lipid and cholesterol check.** / Every 5 years beginning at age 20.  Clinical breast exam.** / Every year after age 40.  Mammogram.** / Every year beginning at age 40 and continuing for as long as you are in good health. Consult with your caregiver.  Pap test.** / Every 3 years starting at age 30 through age 65 or 70 with a 3  consecutive normal Pap tests. Testing can be stopped between 65 and 70 with 3 consecutive normal Pap tests and no abnormal Pap or HPV tests in the past 10 years.  HPV screening.** / Every 3 years from ages 30 through ages 65 or 70 with a history of 3 consecutive normal Pap tests. Testing can be stopped between 65 and 70 with 3 consecutive normal Pap tests and no abnormal Pap or HPV tests in the past 10 years.  Fecal occult blood test (FOBT) of stool. / Every year beginning at age 50 and continuing until age 75. You may not need to do this test if you get a colonoscopy every 10 years.  Flexible sigmoidoscopy or colonoscopy.** / Every 5 years for a flexible sigmoidoscopy or every 10 years for a colonoscopy beginning at age 50 and continuing until age 75.  Hepatitis C blood test.** / For all people born from 1945 through 1965 and any individual with known risks for hepatitis C.  Osteoporosis screening.** / A one-time screening for women ages 65 and over and women at risk for fractures or osteoporosis.  Skin self-exam. / Monthly.  Influenza immunization.** / Every year.  Pneumococcal polysaccharide immunization.** / 1 dose at age 65 (or older) if you have never been vaccinated.  Tetanus, diphtheria, pertussis (Tdap, Td) immunization. / A one-time dose of Tdap vaccine if you are over   65 and have contact with an infant, are a healthcare worker, or simply want to be protected from whooping cough. After that, you need a Td booster dose every 10 years.  Varicella immunization.** / Consult your caregiver.  Meningococcal immunization.** / Consult your caregiver.  Hepatitis A immunization.** / Consult your caregiver. 2 doses, 6 to 18 months apart.  Hepatitis B immunization.** / Check with your caregiver. 3 doses, usually over 6 months. ** Family history and personal history of risk and conditions may change your caregiver's recommendations. Document Released: 03/14/2001 Document Revised: 04/10/2011  Document Reviewed: 06/13/2010 [employer] Patient Information 2013 [employer].  

## 2012-05-25 ENCOUNTER — Encounter: Payer: Self-pay | Admitting: Family Medicine

## 2012-05-25 DIAGNOSIS — Z0000 Encounter for general adult medical examination without abnormal findings: Secondary | ICD-10-CM | POA: Insufficient documentation

## 2012-05-25 DIAGNOSIS — Z23 Encounter for immunization: Secondary | ICD-10-CM | POA: Insufficient documentation

## 2012-05-26 NOTE — Addendum Note (Signed)
Addended by: Birdena Jubilee on: 05/26/2012 12:53 PM   Modules accepted: Level of Service

## 2012-05-27 ENCOUNTER — Ambulatory Visit (INDEPENDENT_AMBULATORY_CARE_PROVIDER_SITE_OTHER): Payer: 59 | Admitting: *Deleted

## 2012-05-27 DIAGNOSIS — Z111 Encounter for screening for respiratory tuberculosis: Secondary | ICD-10-CM

## 2012-05-27 MED ORDER — TUBERCULIN PPD 5 UNIT/0.1ML ID SOLN
5.0000 [IU] | Freq: Once | INTRADERMAL | Status: AC
  Administered 2012-05-27: 5 [IU] via INTRADERMAL

## 2012-05-29 ENCOUNTER — Encounter: Payer: Self-pay | Admitting: Family Medicine

## 2012-05-29 ENCOUNTER — Ambulatory Visit (INDEPENDENT_AMBULATORY_CARE_PROVIDER_SITE_OTHER): Payer: 59 | Admitting: Family Medicine

## 2012-05-29 VITALS — BP 130/85 | HR 76 | Ht 62.0 in | Wt 155.0 lb

## 2012-05-29 DIAGNOSIS — Z111 Encounter for screening for respiratory tuberculosis: Secondary | ICD-10-CM

## 2012-05-29 DIAGNOSIS — R41840 Attention and concentration deficit: Secondary | ICD-10-CM

## 2012-05-29 LAB — READ PPD: TB Skin Test: NEGATIVE

## 2012-05-29 NOTE — Progress Notes (Deleted)
Subjective:     Patient ID: Lindsay Rios, female   DOB: 1965/03/12, 47 y.o.   MRN: [id number]  HPI   Review of Systems     Objective:   Physical Exam     Assessment:     ***    Plan:     ***

## 2012-05-29 NOTE — Progress Notes (Signed)
  Subjective:    Patient ID: Lindsay Rios, female    DOB: Oct 01, 1965, 47 y.o.   MRN: [id number]  HPI  Lindsay Rios is in today to have her ADD medication (Vyvanse) refilled. She is doing well on her current dosage. She is eating and sleeping fine.  The medication seems to be controlling her concentration issues.  She also needs to have her PPD read.    Review of Systems  Constitutional: Positive for appetite change (Less hungry on ADD medication ). Negative for fatigue and unexpected weight change.  Cardiovascular: Negative for chest pain and palpitations.  Neurological: Negative for dizziness and headaches.  Psychiatric/Behavioral: Positive for decreased concentration (Improved on ADD medication ). Negative for behavioral problems, sleep disturbance, dysphoric mood and agitation. The patient is not nervous/anxious and is not hyperactive.        Objective:   Physical Exam  Constitutional: She appears well-nourished. No distress.  HENT:  Head: Normocephalic.  Eyes: No scleral icterus.  Neck: No thyromegaly present.  Cardiovascular: Normal rate, regular rhythm and normal heart sounds.   Pulmonary/Chest: Effort normal and breath sounds normal.  Abdominal: There is no tenderness.  Musculoskeletal: She exhibits no edema and no tenderness.  Neurological: She is alert.  Skin: Skin is warm and dry.  Psychiatric: She has a normal mood and affect. Her behavior is normal. Judgment and thought content normal.      Assessment & Plan:   1)  ADD - Refilled her Vyvanse for 3 months.    2)  PPD- Negative

## 2012-06-02 ENCOUNTER — Encounter: Payer: Self-pay | Admitting: Family Medicine

## 2012-06-02 DIAGNOSIS — R41840 Attention and concentration deficit: Secondary | ICD-10-CM | POA: Insufficient documentation

## 2012-06-02 MED ORDER — LISDEXAMFETAMINE DIMESYLATE 40 MG PO CAPS: 40.0000 mg | ORAL_CAPSULE | ORAL | Status: DC

## 2012-06-02 NOTE — Patient Instructions (Addendum)
Tuberculin Skin Test  he PPD skin test is a method used to help with the diagnosis of a disease called tuberculosis (TB). HOW THE TEST IS DONE  The test site (usually the forearm) is cleansed. The PPD extract is then injected under the top layer of skin, causing a blister to form on the skin. The reaction will take 48 - 72 hours to develop. You must return to your health care provider within that time to have the area checked. This will determine whether you have had a significant reaction to the PPD test. A reaction is measured in millimeters of hard swelling (induration) at the site. PREPARATION FOR TEST  There is no special preparation for this test. People with a skin rash or other skin irritations on their arms may need to have the test performed at a different spot on the body. Tell your health care provider if you have ever had a positive PPD skin test. If so, you should not have a repeat PPD test. Tell your doctor if you have a medical condition or if you take certain drugs, such as steroids, that can affect your immune system. These situations may lead to inaccurate test results. NORMAL FINDINGS -  YOUR RESULT WAS NORMAL (05/29/12)  A negative reaction (no induration) or a level of hard swelling that falls below a certain cutoff may mean that a person has not been infected with the bacteria that cause TB. There are different cutoffs for children, people with HIV, and other risk groups. Unfortunately, this is not a perfect test, and up to 20% of people infected with tuberculosis may not have a reaction on the PPD skin test. In addition, certain conditions that affect the immune system (cancer, recent chemotherapy, late-stage AIDS) may cause a false-negative test result.  The reaction will take 48 - 72 hours to develop. You must return to your health care provider within that time to have the area checked. Follow your caregiver's instructions as to where and when to report for this to be  done. Ranges for normal findings may vary among different laboratories and hospitals. You should always check with your doctor after having lab work or other tests done to discuss the meaning of your test results and whether your values are considered within normal limits. WHAT ABNORMAL RESULTS MEAN  The results of the test depend on the size of the skin reaction and on the person being tested.  A small reaction (5 mm of hard swelling at the site) is considered to be positive in people who have HIV, who are taking steroid therapy, or who have been in close contact with a person who has active tuberculosis. Larger reactions (greater than or equal to 10 mm) are considered positive in people with diabetes or kidney failure, and in health care workers, among others. In people with no known risks for tuberculosis, a positive reaction requires 15 mm or more of hard swelling at the site. RISKS AND COMPLICATIONS There is a very small risk of severe redness and swelling of the arm in people who have had a previous positive PPD test and who have the test again. There also have been a few rare cases of this reaction in people who have not been tested before. CONSIDERATIONS  A positive skin test does not necessarily mean that a person has active tuberculosis. More tests will be done to check whether active disease is present. Many people who were born outside the [place] may have had a vaccine  called "BCG," which can lead to a false-positive test result. MEANING OF TEST  Your caregiver will go over the test results with you and discuss the importance and meaning of your results, as well as treatment options and the need for additional tests if necessary. OBTAINING THE TEST RESULTS It is your responsibility to obtain your test results. Ask the lab or department performing the test when and how you will get your results. Document Released: 10/26/2004 Document Revised: 04/10/2011 Document Reviewed:  12/29/2007 [facility] Patient Information 2013 [place], [place].

## 2012-06-03 MED ORDER — TUBERCULIN PPD 5 UNIT/0.1ML ID SOLN
5.0000 [IU] | Freq: Once | INTRADERMAL | Status: AC
  Administered 2012-05-27: 5 [IU] via INTRADERMAL

## 2012-06-03 NOTE — Addendum Note (Signed)
Addended by: Clint Bolder D on: 06/03/2012 03:23 PM   Modules accepted: Orders

## 2012-06-05 NOTE — Addendum Note (Signed)
Addended by: Clint Bolder D on: 06/05/2012 02:25 PM   Modules accepted: Orders

## 2012-07-04 ENCOUNTER — Ambulatory Visit: Payer: 59

## 2012-07-11 ENCOUNTER — Ambulatory Visit: Payer: 59 | Admitting: *Deleted

## 2012-07-11 DIAGNOSIS — E663 Overweight: Secondary | ICD-10-CM

## 2012-07-12 ENCOUNTER — Other Ambulatory Visit: Payer: Self-pay | Admitting: Obstetrics and Gynecology

## 2012-07-12 DIAGNOSIS — R928 Other abnormal and inconclusive findings on diagnostic imaging of breast: Secondary | ICD-10-CM

## 2012-07-18 ENCOUNTER — Encounter: Payer: 59 | Admitting: Family Medicine

## 2012-07-25 ENCOUNTER — Encounter: Payer: 59 | Admitting: Family Medicine

## 2012-07-25 ENCOUNTER — Ambulatory Visit (INDEPENDENT_AMBULATORY_CARE_PROVIDER_SITE_OTHER): Payer: 59 | Admitting: *Deleted

## 2012-07-25 VITALS — Wt 157.0 lb

## 2012-07-25 DIAGNOSIS — E663 Overweight: Secondary | ICD-10-CM

## 2012-07-29 ENCOUNTER — Ambulatory Visit
Admission: RE | Admit: 2012-07-29 | Discharge: 2012-07-29 | Disposition: A | Discharge status: Home / Self Care | Payer: 59 | Source: Ambulatory Visit | Attending: Obstetrics and Gynecology | Admitting: Obstetrics and Gynecology

## 2012-07-29 DIAGNOSIS — R928 Other abnormal and inconclusive findings on diagnostic imaging of breast: Secondary | ICD-10-CM

## 2012-07-31 ENCOUNTER — Ambulatory Visit: Payer: 59 | Admitting: Family Medicine

## 2012-10-02 ENCOUNTER — Ambulatory Visit: Payer: 59 | Admitting: Internal Medicine

## 2012-11-28 ENCOUNTER — Encounter: Payer: Self-pay | Admitting: *Deleted

## 2013-01-01 ENCOUNTER — Encounter: Payer: Self-pay | Admitting: Family Medicine

## 2013-01-01 ENCOUNTER — Ambulatory Visit (INDEPENDENT_AMBULATORY_CARE_PROVIDER_SITE_OTHER): Payer: 59 | Admitting: Family Medicine

## 2013-01-01 VITALS — BP 125/86 | HR 71 | Resp 16 | Ht 62.0 in | Wt 176.0 lb

## 2013-01-01 DIAGNOSIS — IMO0001 Reserved for inherently not codable concepts without codable children: Secondary | ICD-10-CM

## 2013-01-01 DIAGNOSIS — E669 Obesity, unspecified: Secondary | ICD-10-CM

## 2013-01-01 DIAGNOSIS — R41840 Attention and concentration deficit: Secondary | ICD-10-CM

## 2013-01-01 DIAGNOSIS — E785 Hyperlipidemia, unspecified: Secondary | ICD-10-CM

## 2013-01-01 DIAGNOSIS — F411 Generalized anxiety disorder: Secondary | ICD-10-CM

## 2013-01-01 DIAGNOSIS — I10 Essential (primary) hypertension: Secondary | ICD-10-CM

## 2013-01-01 MED ORDER — PHENTERMINE-TOPIRAMATE ER 7.5-46 MG PO CP24: 1.0000 | ORAL_CAPSULE | Freq: Every day | ORAL | Status: DC

## 2013-01-01 MED ORDER — LISDEXAMFETAMINE DIMESYLATE 20 MG PO CAPS: 20.0000 mg | ORAL_CAPSULE | Freq: Every day | ORAL | Status: DC

## 2013-01-01 MED ORDER — ROSUVASTATIN CALCIUM 40 MG PO TABS: ORAL_TABLET | ORAL | Status: DC

## 2013-01-01 MED ORDER — FLUOXETINE HCL 20 MG PO CAPS: 20.0000 mg | ORAL_CAPSULE | Freq: Every day | ORAL | Status: DC

## 2013-01-01 MED ORDER — OLMESARTAN MEDOXOMIL-HCTZ 40-25 MG PO TABS: 1.0000 | ORAL_TABLET | Freq: Every day | ORAL | Status: DC

## 2013-01-01 MED ORDER — PHENTERMINE-TOPIRAMATE ER 3.75-23 MG PO CP24: 1.0000 | ORAL_CAPSULE | Freq: Every day | ORAL | Status: DC

## 2013-01-01 NOTE — Progress Notes (Signed)
Subjective:    Patient ID: Lindsay Rios, female    DOB: [birth year], 47 y.o.   MRN: [id number]  HPI  Lindsay Rios is here today for medication refills and to discuss the conditions listed below:   1)  ADD - She is currently taking Vyvanse 20 mg (1/2 of a 40 mg).  She feels that this dosage helps her in her school work and it does not make her shaky.  She would prefer to have a prescription for 20 mg capsule so she does not have to split it.    2)  Hyperlipidemia - She has been taking Crestor 20 mg (1/2 of a 40 mg) for her cholesterol and needs to have it refilled.    3)  Hypertension - Her pressure is controlled on Benicar HCT 40/25.    4)  Weight - She continues to struggle with her weight.  She has done the HCG diet a couple of times.  She has been reading about Belviq and would like to know what we think about this medication.     Review of Systems  Constitutional: Positive for unexpected weight change (weight gain ). Negative for activity change, appetite change and fatigue.  HENT: Negative for mouth sores.   Respiratory: Negative.   Cardiovascular: Negative for chest pain and palpitations.  Musculoskeletal: Negative for myalgias.  Neurological: Negative for dizziness and headaches.  Psychiatric/Behavioral: Negative for behavioral problems, sleep disturbance, dysphoric mood, decreased concentration and agitation. The patient is not nervous/anxious and is not hyperactive.   All other systems reviewed and are negative.     Past Medical History  Diagnosis Date  . Hypertension   . Depression   . Hyperlipidemia   . ADD (attention deficit disorder)   . Hx gestational diabetes   . Osteopenia   . 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 Abdominal hysterectomy      HPV/Cervical Dysplasia     History   Social History Narrative   Marital Status: Separated Arlys John) Significant Other Duanne Guess)    Children:  Son (1)  Daughter (1)    Pets: Dogs (2), Cat (1)    Living Situation: Lives with children.   Occupation: Full- Time Student    Education: [employer] (C[employer] Program)    Tobacco Use: She has never smoked.     Alcohol Use:  Rarely   Drug Use:  None   Diet:  Regular   Exercise:  None   Hobbies: Shopping           Family History  Problem Relation Age of Onset  . Hyperlipidemia Mother   . Hyperlipidemia Father   . Hypertension Father   . Diabetes Father   . Heart disease Father   . Cancer Maternal Grandmother     Colon Cancer  . Alzheimer's disease Maternal Grandmother      Current Outpatient Prescriptions on File Prior to Visit  Medication Sig Dispense Refill  . lisdexamfetamine (VYVANSE) 40 MG capsule Take 1 capsule (40 mg total) by mouth every morning.  90 capsule  0   No current facility-administered medications on file prior to visit.     Allergies  Allergen Reactions  . Sulfonamide Derivatives     REACTION: nausea     Immunization History  Administered Date(s) Administered  . Influenza Whole 12/26/2006, 11/19/2008  . Influenza-Unspecified 11/27/2012  . PPD Test 05/27/2012, 05/27/2012  . Tdap 05/23/2012  Objective:   Physical Exam  Nursing note and vitals reviewed. Constitutional: She is oriented to person, place, and time.  Eyes: Conjunctivae are normal. No scleral icterus.  Neck: Neck supple. No thyromegaly present.  Cardiovascular: Normal rate, regular rhythm and normal heart sounds.   Pulmonary/Chest: Effort normal and breath sounds normal.  Musculoskeletal: She exhibits no edema and no tenderness.  Lymphadenopathy:    She has no cervical adenopathy.  Neurological: She is alert and oriented to person, place, and time.  Skin: Skin is warm and dry.  Psychiatric: She has a normal mood and affect. Her behavior is normal. Judgment and thought content normal.      Assessment & Plan:    Lindsay Rios was seen today for medication management.  Diagnoses and  associated orders for this visit:  Attention/concentration deficit, non-ADHD - lisdexamfetamine (VYVANSE) 20 MG capsule; Take 1 capsule (20 mg total) by mouth daily. (Refilled x 3 months)   Other and unspecified hyperlipidemia - rosuvastatin (CRESTOR) 40 MG tablet; Take 1/2 - 1 tab daily  Essential hypertension, benign - olmesartan-hydrochlorothiazide (BENICAR HCT) 40-25 MG per tablet; Take 1 tablet by mouth daily.  Anxiety state, unspecified - FLUoxetine (PROZAC) 20 MG capsule; Take 1 capsule (20 mg total) by mouth daily.  Obesity, unspecified - Phentermine-Topiramate (QSYMIA) 7.5-46 MG CP24; Take 1 capsule by mouth daily.   TIME SPENT "FACE TO FACE" WITH PATIENT -  30 MINS

## 2013-03-17 ENCOUNTER — Encounter: Payer: Self-pay | Admitting: Family Medicine

## 2013-03-26 ENCOUNTER — Encounter: Payer: Self-pay | Admitting: Family Medicine

## 2013-03-26 ENCOUNTER — Ambulatory Visit (INDEPENDENT_AMBULATORY_CARE_PROVIDER_SITE_OTHER): Payer: 59 | Admitting: Family Medicine

## 2013-03-26 VITALS — BP 114/80 | HR 90 | Resp 16 | Wt 170.0 lb

## 2013-03-26 DIAGNOSIS — E785 Hyperlipidemia, unspecified: Secondary | ICD-10-CM

## 2013-03-26 DIAGNOSIS — IMO0001 Reserved for inherently not codable concepts without codable children: Secondary | ICD-10-CM

## 2013-03-26 DIAGNOSIS — E559 Vitamin D deficiency, unspecified: Secondary | ICD-10-CM

## 2013-03-26 DIAGNOSIS — R5383 Other fatigue: Principal | ICD-10-CM

## 2013-03-26 DIAGNOSIS — F988 Other specified behavioral and emotional disorders with onset usually occurring in childhood and adolescence: Secondary | ICD-10-CM

## 2013-03-26 DIAGNOSIS — R7301 Impaired fasting glucose: Secondary | ICD-10-CM

## 2013-03-26 DIAGNOSIS — R079 Chest pain, unspecified: Secondary | ICD-10-CM

## 2013-03-26 DIAGNOSIS — R5381 Other malaise: Secondary | ICD-10-CM

## 2013-03-26 LAB — COMPLETE METABOLIC PANEL WITH GFR
ALT: 74 U/L — ABNORMAL HIGH (ref 0–35)
AST: 54 U/L — ABNORMAL HIGH (ref 0–37)
Albumin: 4.6 g/dL (ref 3.5–5.2)
Alkaline Phosphatase: 63 U/L (ref 39–117)
BUN: 16 mg/dL (ref 6–23)
CO2: 29 mEq/L (ref 19–32)
Calcium: 10 mg/dL (ref 8.4–10.5)
Chloride: 100 mEq/L (ref 96–112)
Creat: 0.81 mg/dL (ref 0.50–1.10)
GFR, Est African American: >89 mL/min
GFR, Est Non African American: 88 mL/min
Glucose, Bld: 90 mg/dL (ref 70–99)
Potassium: 3.9 mEq/L (ref 3.5–5.3)
Sodium: 139 mEq/L (ref 135–145)
Total Bilirubin: 0.7 mg/dL (ref 0.2–1.2)
Total Protein: 7.6 g/dL (ref 6.0–8.3)

## 2013-03-26 LAB — CBC WITH DIFFERENTIAL/PLATELET
Basophils Absolute: 0 10*3/uL (ref 0.0–0.1)
Basophils Relative: 0 % (ref 0–1)
Eosinophils Absolute: 0.1 10*3/uL (ref 0.0–0.7)
Eosinophils Relative: 1 % (ref 0–5)
HCT: 44.7 % (ref 36.0–46.0)
Hemoglobin: 15.6 g/dL — ABNORMAL HIGH (ref 12.0–15.0)
Lymphocytes Relative: 24 % (ref 12–46)
Lymphs Abs: 2.4 10*3/uL (ref 0.7–4.0)
MCH: 29.3 pg (ref 26.0–34.0)
MCHC: 34.9 g/dL (ref 30.0–36.0)
MCV: 83.9 fL (ref 78.0–100.0)
Monocytes Absolute: 0.7 10*3/uL (ref 0.1–1.0)
Monocytes Relative: 7 % (ref 3–12)
Neutro Abs: 6.7 10*3/uL (ref 1.7–7.7)
Neutrophils Relative %: 68 % (ref 43–77)
Platelets: 306 10*3/uL (ref 150–400)
RBC: 5.33 MIL/uL — ABNORMAL HIGH (ref 3.87–5.11)
RDW: 13.8 % (ref 11.5–15.5)
WBC: 9.9 10*3/uL (ref 4.0–10.5)

## 2013-03-26 MED ORDER — LISDEXAMFETAMINE DIMESYLATE 20 MG PO CAPS: 20.0000 mg | ORAL_CAPSULE | Freq: Every day | ORAL | Status: DC

## 2013-03-26 NOTE — Progress Notes (Signed)
Subjective:    Patient ID: Lindsay Rios, female    DOB: 28-Nov-1965, 48 y.o.   MRN: [id number]  HPI  Lindsay Rios is here today to discuss a couple of issues.   1)  ADD - She needs a refill on her ADD medication (Vyvanse 20 mg).  She has not been taking it as often as before because she is not in school this semester.  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    2)  Cardiac Evaluation - She is concerned about her cardiac risk factors.  Her 45 year old brother recently had a heart attack which has her very worried and she would like to be referred to a cardiologist to see what type of evaluation she should have.  She says that she has had some mild chest discomfort in the past.    3)  Blood Sugar - She is concerned about her blood sugar and would like to have it checked.  She did have gestational diabetes.      Review of Systems  Constitutional: Negative for activity change, fatigue and unexpected weight change.  HENT: Negative.   Eyes: Negative.   Respiratory: Negative for shortness of breath.   Cardiovascular: Negative for chest pain, palpitations and leg swelling.  Gastrointestinal: Negative for diarrhea and constipation.  Endocrine: Negative.   Genitourinary: Negative for difficulty urinating.  Musculoskeletal: Negative.   Skin: Negative.   Neurological: Negative.   Hematological: Negative for adenopathy. Does not bruise/bleed easily.  Psychiatric/Behavioral: Negative for sleep disturbance, dysphoric mood and decreased concentration. The patient is not nervous/anxious.      Past Medical History  Diagnosis Date  . Hypertension   . Depression   . Hyperlipidemia   . ADD (attention deficit disorder)   . Hx gestational diabetes   . Osteopenia   . 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 Abdominal hysterectomy      HPV/Cervical Dysplasia     History   Social History Narrative   Marital Status: Separated [name]) Significant Other [name])    Children:  Son (1) Daughter (1)    Pets: Dogs (2), Cat (1)    Living Situation: Lives with children.   Occupation: Full- Time Student    Education: [employer] ([place])    Tobacco Use: She has never smoked.     Alcohol Use:  Rarely   Drug Use:  None   Diet:  Regular   Exercise:  None   Hobbies: Shopping           Family History  Problem Relation Age of Onset  . Hyperlipidemia Mother   . Hyperlipidemia Father   . Hypertension Father   . Diabetes Father   . Heart disease Father   . Cancer Maternal Grandmother     Colon Cancer  . Alzheimer's disease Maternal Grandmother   . Heart disease Brother 40    Myocardial Infarction     Current Outpatient Prescriptions on File Prior to Visit  Medication Sig Dispense Refill  . FLUoxetine (PROZAC) 20 MG capsule Take 1 capsule (20 mg total) by mouth daily.  90 capsule  1  . olmesartan-hydrochlorothiazide (BENICAR HCT) 40-25 MG per tablet Take 1 tablet by mouth daily.  [place]  tablet  11  . rosuvastatin (CRESTOR) 40 MG tablet Take 1/2 - 1 tab daily  30 tablet  5   No current facility-administered medications on file prior to visit.     Allergies  Allergen Reactions  . Sulfonamide Derivatives     REACTION: nausea     Immunization History  Administered Date(s) Administered  . Influenza Whole 12/26/2006, 11/19/2008  . Influenza-Unspecified 11/27/2012  . PPD Test 05/27/2012, 05/27/2012  . Tdap 05/23/2012        Objective:   Physical Exam  Vitals reviewed. Constitutional: She is oriented to person, place, and time.  Eyes: Conjunctivae are normal. No scleral icterus.  Neck: Neck supple. No thyromegaly present.  Cardiovascular: Normal rate, regular rhythm and normal heart sounds.   Pulmonary/Chest: Effort normal and breath sounds  normal.  Musculoskeletal: She exhibits no edema and no tenderness.  Lymphadenopathy:    She has no cervical adenopathy.  Neurological: She is alert and oriented to person, place, and time.  Skin: Skin is warm and dry.  Psychiatric: She has a normal mood and affect. Her behavior is normal. Judgment and thought content normal.      Assessment & Plan:    Raven was seen today for medication management.  Diagnoses and associated orders for this visit:  Other malaise and fatigue - CBC w/Diff - TSH  Other and unspecified hyperlipidemia - NMR Lipoprofile with Lipids  Unspecified vitamin D deficiency - Vit D  25 hydroxy (rtn osteoporosis monitoring)  Impaired fasting glucose - COMPLETE METABOLIC PANEL WITH GFR - Hemoglobin A1C  Chest pain, unspecified - CRP High sensitivity - Ambulatory referral to Cardiology  Attention/concentration deficit, non-ADHD - lisdexamfetamine (VYVANSE) 20 MG capsule; Take 1 capsule (20 mg total) by mouth daily.   TIME SPENT "FACE TO FACE" WITH PATIENT -  30 MINS

## 2013-03-27 ENCOUNTER — Encounter: Payer: Self-pay | Admitting: Family Medicine

## 2013-03-27 LAB — HIGH SENSITIVITY CRP: CRP, High Sensitivity: 5.6 mg/L — ABNORMAL HIGH

## 2013-03-27 LAB — VITAMIN D 25 HYDROXY (VIT D DEFICIENCY, FRACTURES): Vit D, 25-Hydroxy: 36 ng/mL (ref 30–89)

## 2013-03-27 LAB — HEMOGLOBIN A1C
Hgb A1c MFr Bld: 6.5 % — ABNORMAL HIGH (ref <5.7)
Mean Plasma Glucose: 140 mg/dL — ABNORMAL HIGH (ref <117)

## 2013-03-27 LAB — TSH: TSH: 0.75 u[IU]/mL (ref 0.350–4.500)

## 2013-03-27 NOTE — Patient Instructions (Signed)
1)  Cardiac Risk Factors - We are checking several labs to help better evaluate Lindsay Rios's cardiac risk factors and am referring her to Dr. Stanford Breed to see what if any evaluation she needs to do.     Cardiac Risk Assessment Your doctor may do a number of procedures to determine if you are at risk for having a heart attack or having other problems with your heart. If you are aware of the risks, you can learn how to lower them. YOUR PERSONAL HEALTH HISTORY  Factors that you cannot change include:  Your age.  Your gender.  Your ethnicity.  Family history (people in your family who have health problems).  Problems you were born with.  Factors that you can change include:  How active you are.  Blood pressure.  Your weight.  Smoking.  Alcohol or drug abuse.  How well you control your blood sugar (if you are diabetic).  Lipid profile. PHYSICAL EXAM MAY INCLUDE:  Listening to your heart and lungs with a stethoscope.  Looking into your ears and mouth.  Touching pulses at your wrists, ankles and feet.  Feeling your belly.  Testing your reflexes with a little rubber hammer. NON-INVASIVE TESTS MAY INCLUDE:  EKG (cardiogram).  Chest x-ray.  Stress test.  CT scan.  Echocardiogram.  Weight and height measurements. INVASIVE TESTS MAY INCLUDE:  Blood analysis.  Angiogram or arteriogram. (Sometimes called cardiac cath).  Stress test with IV (in the vein) medicine. IMPORTANT LAB TESTS  Desired fats (lipids) results:  Total cholesterol less than 200 mg.  HDL (good cholesterol) greater than 50 mg. Inherited trait. May be raised through exercise and some medicines.  LDL (bad cholesterol) less than 100 mg.  Triglycerides less than 150 mg.  Uric acid level. (An early warning sign).  Special CRP. (Believed by some to indicate higher risk). TREATMENT  Treatment will be based on personal history and test results.  Most people will benefit from eating a healthy  diet, low in saturated fat and high in fiber.  Exercise may lower blood pressure and positively impact lipid levels.  There are also drugs that may be effective in lipid management. FOR MORE INFORMATION VISIT: [email or web address].org Document Released: 02/09/2004 Document Revised: 04/10/2011 Document Reviewed: 03/26/2007 [facility] Patient Information 2014 [place], [place].

## 2013-03-28 LAB — NMR LIPOPROFILE WITH LIPIDS
Cholesterol, Total: 132 mg/dL (ref <200)
HDL Particle Number: 40 umol/L (ref >=30.5)
HDL Size: 8.7 nm — ABNORMAL LOW (ref >=9.2)
HDL-C: 47 mg/dL (ref >=40)
LDL (calc): 58 mg/dL (ref <100)
LDL Particle Number: 1161 nmol/L — ABNORMAL HIGH (ref <1000)
LDL Size: 20 nm — ABNORMAL LOW (ref >20.5)
LP-IR Score: 71 — ABNORMAL HIGH (ref <=45)
Large HDL-P: 1.9 umol/L — ABNORMAL LOW (ref >=4.8)
Large VLDL-P: 4.7 nmol/L — ABNORMAL HIGH (ref <=2.7)
Small LDL Particle Number: 776 nmol/L — ABNORMAL HIGH (ref <=527)
Triglycerides: 135 mg/dL (ref <150)
VLDL Size: 48.6 nm — ABNORMAL HIGH (ref <=46.6)

## 2013-04-08 ENCOUNTER — Encounter: Payer: Self-pay | Admitting: Family Medicine

## 2013-04-08 ENCOUNTER — Ambulatory Visit (INDEPENDENT_AMBULATORY_CARE_PROVIDER_SITE_OTHER): Payer: 59 | Admitting: Family Medicine

## 2013-04-08 ENCOUNTER — Ambulatory Visit: Payer: 59 | Admitting: Family Medicine

## 2013-04-08 VITALS — BP 111/81 | HR 93 | Resp 16 | Wt 172.0 lb

## 2013-04-08 DIAGNOSIS — R740 Nonspecific elevation of levels of transaminase and lactic acid dehydrogenase [LDH]: Secondary | ICD-10-CM

## 2013-04-08 DIAGNOSIS — R7402 Elevation of levels of lactic acid dehydrogenase (LDH): Secondary | ICD-10-CM

## 2013-04-08 DIAGNOSIS — R7301 Impaired fasting glucose: Secondary | ICD-10-CM

## 2013-04-08 DIAGNOSIS — F4329 Adjustment disorder with other symptoms: Secondary | ICD-10-CM

## 2013-04-08 DIAGNOSIS — R7401 Elevation of levels of liver transaminase levels: Secondary | ICD-10-CM

## 2013-04-08 DIAGNOSIS — F438 Other reactions to severe stress: Secondary | ICD-10-CM

## 2013-04-08 DIAGNOSIS — F4389 Other reactions to severe stress: Secondary | ICD-10-CM

## 2013-04-08 NOTE — Progress Notes (Signed)
Subjective:    Patient ID: Lindsay Rios, female    DOB: 1965/06/17, 48 y.o.   MRN: [id number]  HPI  Lindsay Rios is here today to go over her most recent lab results.  She is doing well since her last office visit.    1)  Stress - She has a lot of stress in her life and feels that she is becoming more depressed.  She takes fluoxetine and does not feel that it is helping her very much.  She admits to not taking Fluoxetine every day because she feels that she is taking too many medications.    2)  Blood Sugar - She has a history of gestational diabetes and has been worried about her blood sugar since she has regained weight.    Review of Systems  Constitutional: Positive for unexpected weight change.  Musculoskeletal:       Left hip pain.   Psychiatric/Behavioral:       Depressed     Past Medical History  Diagnosis Date  . Hypertension   . Depression   . Hyperlipidemia   . ADD (attention deficit disorder)   . Hx gestational diabetes   . Osteopenia   . 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 Abdominal hysterectomy      HPV/Cervical Dysplasia     History   Social History Narrative   Marital Status: Separated Lindsay Rios) Significant Other Lindsay Rios)    Children:  Son (1) Daughter (1)    Pets: Dogs (2), Cat (1)    Living Situation: Lives with children.   Occupation: Full- Time Student    Education: [place] ([place]    Tobacco Use: She has never smoked.     Alcohol Use:  Rarely   Drug Use:  None   Diet:  Regular   Exercise:  None   Hobbies: Shopping           Family History  Problem Relation Age of Onset  . Hyperlipidemia Mother   . Hyperlipidemia Father   . Hypertension Father   . Diabetes Father   . Heart disease Father   . Cancer Maternal Grandmother     Colon Cancer  . Alzheimer's disease Maternal Grandmother   . Heart disease Brother 40    Myocardial Infarction     Current Outpatient  Prescriptions on File Prior to Visit  Medication Sig Dispense Refill  . FLUoxetine (PROZAC) 20 MG capsule Take 1 capsule (20 mg total) by mouth daily.  90 capsule  1  . lisdexamfetamine (VYVANSE) 20 MG capsule Take 1 capsule (20 mg total) by mouth daily.  30 capsule  0  . olmesartan-hydrochlorothiazide (BENICAR HCT) 40-25 MG per tablet Take 1 tablet by mouth daily.  30 tablet  11  . rosuvastatin (CRESTOR) 40 MG tablet Take 1/2 - 1 tab daily  30 tablet  5   No current facility-administered medications on file prior to visit.     Allergies  Allergen Reactions  . Sulfonamide Derivatives     REACTION: nausea     Immunization History  Administered Date(s) Administered  . Influenza Whole 12/26/2006, 11/19/2008  . Influenza-Unspecified 11/27/2012  . PPD Test 05/27/2012, 05/27/2012  . Tdap 05/23/2012       Objective:   Physical Exam  Constitutional: She appears well-nourished. No distress.  HENT:  Head: Normocephalic.  Eyes: No scleral icterus.  Neck: No thyromegaly present.  Cardiovascular: Normal  rate, regular rhythm and normal heart sounds.   Pulmonary/Chest: Effort normal and breath sounds normal.  Abdominal: There is no tenderness.  Musculoskeletal: She exhibits no edema and no tenderness.  Neurological: She is alert.  Skin: Skin is warm and dry.  Psychiatric: She has a normal mood and affect. Her behavior is normal. Judgment and thought content normal.      Assessment & Plan:    Lindsay Rios was seen today for lab results.  Diagnoses and associated orders for this visit:  IFG (impaired fasting glucose) Comments: Her A1c is elevated at 6.5%.  She is going to start on metformin.   - Hemoglobin A1c; Future - metFORMIN (GLUCOPHAGE-XR) 500 MG 24 hr tablet; Take 2 tablets (1,000 mg total) by mouth at bedtime.  Elevated transaminase level Comments: Her LFTs are elevated. She is to start on Vitamin E and is to lower her intake of fat.  - COMPLETE METABOLIC PANEL WITH GFR;  Future  Stress and adjustment reaction Comments: She is to take her Prozac daily.     TIME SPENT "FACE TO FACE" WITH PATIENT -  30 MINS

## 2013-04-08 NOTE — Patient Instructions (Signed)
1)  Blood Sugar   Cinnamon/Chromium Exercise Low Carb/Low Fat  "The End of Diabetes" - Dr. Excell Seltzer Netflix - "Fat, Sick & Nearly Dead 1 & 2" Metformin ER 500 - 1000 mg per day    2)  Liver Enzymes -   Vitamin E 800 IU  Low Carb  3)  High CRP - Baby ASA     Insulin Resistance Blood sugar (glucose) levels are controlled by a hormone called insulin. Insulin is made by your pancreas. When your blood glucose goes up, insulin is released into your blood. Insulin is required for your body to function normally. However, your body can become resistant to your own insulin or to insulin given to treat diabetes. In either case, insulin resistance can lead to serious problems. These problems include:  Type 2 diabetes.  Heart disease.  High blood pressure.  Stroke.  Polycystic ovary syndrome.  Fatty liver. CAUSES  Insulin resistance can develop for many different reasons. It is more likely to happen in people with these conditions or characteristics:  Obesity.  Inactivity.  Pregnancy.  High blood pressure.  Stress.  Steroid use.  Infection or severe illness.  Increased levels of cholesterol and triglycerides. SYMPTOMS  There are no symptoms. You may have symptoms related to the various complications of insulin resistance.  DIAGNOSIS  Several different things can make your caregiver suspect you have insulin resistance. These include:  High blood glucose (hyperglycemia).  Abnormal cholesterol levels.  High uric acid levels.  Changes related to blood pressure.  Changes related to inflammation. Insulin resistance can be determined with blood tests. An elevated insulin level when you have not eaten might suggest resistance. Other more complicated tests are sometimes necessary. TREATMENT  Lifestyle changes are the most important treatment for insulin resistance.   If you are overweight and you have insulin resistance, you can improve your insulin sensitivity by  losing weight.  Moderate exercise for 30 40 minutes, 4 days a week, can improve insulin sensitivity. Some medicines can also help improve your insulin sensitivity. Your caregiver can discuss these with you if they are appropriate.  HOME CARE INSTRUCTIONS   Do not smoke.  Keep your weight at a healthy level.  Get exercise.  If you have diabetes, follow your caregiver's directions.  If you have high blood pressure, follow your caregiver's directions.  Only take prescription medicines for pain, fever, or discomfort as directed by your caregiver. SEEK MEDICAL CARE IF:   You are diabetic and you are having problems keeping your blood glucose levels at target range.  You are having episodes of low blood glucose (hypoglycemia).  You feel you might be having side effects from your medicines.  You have symptoms of an illness that is not improving after 3 4 days.  You have a sore or wound that is not healing.  You notice a change in vision or a new problem with your vision. SEEK IMMEDIATE MEDICAL CARE IF:   Your blood glucose goes below 70, especially if you have confusion, lightheadedness, or other symptoms with it.  Your blood glucose is very high (as advised by your caregiver) twice in a row.  You pass out.  You have chest pain or trouble breathing.  You have a sudden, severe headache.  You have sudden weakness in one arm or one leg.  You have sudden difficulty speaking or swallowing.  You develop vomiting or diarrhea that is getting worse or not improving after 1 day. Document Released: 03/07/2005 Document Revised:  07/18/2011 Document Reviewed: 06/27/2012 [employer] Patient Information 2014 [place], [place].

## 2013-04-09 MED ORDER — METFORMIN HCL ER 500 MG PO TB24: 1000.0000 mg | ORAL_TABLET | Freq: Every day | ORAL | Status: DC

## 2013-05-21 ENCOUNTER — Institutional Professional Consult (permissible substitution): Payer: 59 | Admitting: Cardiology

## 2013-06-14 DIAGNOSIS — R7301 Impaired fasting glucose: Secondary | ICD-10-CM | POA: Insufficient documentation

## 2013-06-14 DIAGNOSIS — F32A Depression, unspecified: Secondary | ICD-10-CM | POA: Insufficient documentation

## 2013-06-14 DIAGNOSIS — R7401 Elevation of levels of liver transaminase levels: Secondary | ICD-10-CM | POA: Insufficient documentation

## 2013-06-14 DIAGNOSIS — F329 Major depressive disorder, single episode, unspecified: Secondary | ICD-10-CM | POA: Insufficient documentation

## 2013-06-14 DIAGNOSIS — F4329 Adjustment disorder with other symptoms: Secondary | ICD-10-CM | POA: Insufficient documentation

## 2013-06-14 DIAGNOSIS — R740 Nonspecific elevation of levels of transaminase and lactic acid dehydrogenase [LDH]: Secondary | ICD-10-CM

## 2013-07-31 ENCOUNTER — Other Ambulatory Visit: Payer: Self-pay | Admitting: *Deleted

## 2013-07-31 DIAGNOSIS — R7301 Impaired fasting glucose: Secondary | ICD-10-CM

## 2013-07-31 DIAGNOSIS — R7401 Elevation of levels of liver transaminase levels: Secondary | ICD-10-CM

## 2013-07-31 DIAGNOSIS — R740 Nonspecific elevation of levels of transaminase and lactic acid dehydrogenase [LDH]: Principal | ICD-10-CM

## 2013-08-04 ENCOUNTER — Other Ambulatory Visit: Payer: 59

## 2013-08-04 LAB — COMPLETE METABOLIC PANEL WITH GFR
ALT: 68 U/L — ABNORMAL HIGH (ref 0–35)
AST: 63 U/L — ABNORMAL HIGH (ref 0–37)
Albumin: 4.3 g/dL (ref 3.5–5.2)
Alkaline Phosphatase: 59 U/L (ref 39–117)
BUN: 13 mg/dL (ref 6–23)
CO2: 30 mEq/L (ref 19–32)
Calcium: 9.6 mg/dL (ref 8.4–10.5)
Chloride: 99 mEq/L (ref 96–112)
Creat: 0.79 mg/dL (ref 0.50–1.10)
GFR, Est African American: >89 mL/min
GFR, Est Non African American: >89 mL/min
Glucose, Bld: 119 mg/dL — ABNORMAL HIGH (ref 70–99)
Potassium: 4 mEq/L (ref 3.5–5.3)
Sodium: 137 mEq/L (ref 135–145)
Total Bilirubin: 0.7 mg/dL (ref 0.2–1.2)
Total Protein: 6.9 g/dL (ref 6.0–8.3)

## 2013-08-04 LAB — HEMOGLOBIN A1C
Hgb A1c MFr Bld: 6.9 % — ABNORMAL HIGH (ref <5.7)
Mean Plasma Glucose: 151 mg/dL — ABNORMAL HIGH (ref <117)

## 2013-08-11 ENCOUNTER — Encounter: Payer: Self-pay | Admitting: Family Medicine

## 2013-08-11 ENCOUNTER — Ambulatory Visit (INDEPENDENT_AMBULATORY_CARE_PROVIDER_SITE_OTHER): Payer: 59 | Admitting: Family Medicine

## 2013-08-11 VITALS — BP 124/81 | HR 81 | Resp 16 | Ht 62.0 in | Wt 174.0 lb

## 2013-08-11 DIAGNOSIS — F411 Generalized anxiety disorder: Secondary | ICD-10-CM

## 2013-08-11 DIAGNOSIS — E119 Type 2 diabetes mellitus without complications: Secondary | ICD-10-CM

## 2013-08-11 DIAGNOSIS — I10 Essential (primary) hypertension: Secondary | ICD-10-CM

## 2013-08-11 DIAGNOSIS — E785 Hyperlipidemia, unspecified: Secondary | ICD-10-CM

## 2013-08-11 DIAGNOSIS — IMO0001 Reserved for inherently not codable concepts without codable children: Secondary | ICD-10-CM

## 2013-08-11 DIAGNOSIS — F988 Other specified behavioral and emotional disorders with onset usually occurring in childhood and adolescence: Secondary | ICD-10-CM

## 2013-08-11 MED ORDER — LISDEXAMFETAMINE DIMESYLATE 20 MG PO CAPS: 20.0000 mg | ORAL_CAPSULE | Freq: Every day | ORAL | Status: DC

## 2013-08-11 MED ORDER — METFORMIN HCL ER 500 MG PO TB24: 1000.0000 mg | ORAL_TABLET | Freq: Every day | ORAL | Status: DC

## 2013-08-11 MED ORDER — FLUOXETINE HCL 20 MG PO CAPS: 20.0000 mg | ORAL_CAPSULE | Freq: Every day | ORAL | Status: DC

## 2013-08-11 MED ORDER — EMPAGLIFLOZIN-LINAGLIPTIN 25-5 MG PO TABS: 1.0000 | ORAL_TABLET | Freq: Every day | ORAL | Status: AC

## 2013-08-11 MED ORDER — ROSUVASTATIN CALCIUM 40 MG PO TABS: ORAL_TABLET | ORAL | Status: DC

## 2013-08-11 NOTE — Progress Notes (Signed)
Subjective:    Patient ID: Lindsay Rios, female    DOB: 05-Dec-1965, 48 y.o.   MRN: [id number]  HPI  Vincy is here today to follow up on her recent labs. She is needing to get medication refills.  1)  Hypertension:  Her blood pressure is well controlled on the Benicar.  2)  Hyperlipidemia:  She is doing well on the Crestor.  3)  Type II DM:  She is taking the metformin and is doing well on it.  4)  ADD:  She is needing a refill on her Vyvanse. She is doing well on the current dose. She is eating and sleeping well.   5)  Mood:  She is doing well on the Prozac. She is needing a refill.   Review of Systems  Constitutional: Negative for activity change, appetite change and fatigue.  Cardiovascular: Negative for chest pain, palpitations and leg swelling.  Endocrine: Negative for polydipsia, polyphagia and polyuria.  Psychiatric/Behavioral: Negative for behavioral problems. The patient is not nervous/anxious.   All other systems reviewed and are negative.    Past Medical History  Diagnosis Date  . Hypertension   . Depression   . Hyperlipidemia   . ADD (attention deficit disorder)   . Hx gestational diabetes   . Osteopenia   . 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 Abdominal hysterectomy      HPV/Cervical Dysplasia     History   Social History Narrative   Marital Status: Separated [name]) Significant Other [name])    Children:  Son (1) Daughter (1)    Pets: Dogs (2), Cat (1)    Living Situation: Lives with children.   Occupation: Full- Time Student    Education: [place]    Tobacco Use: She has never smoked.     Alcohol Use:  Rarely   Drug Use:  None   Diet:  Regular   Exercise:  None   Hobbies: Shopping           Family History  Problem Relation Age of Onset  . Hyperlipidemia Mother   . Hyperlipidemia Father   . Hypertension Father   . Diabetes Father   . Heart disease  Father   . Cancer Maternal Grandmother     Colon Cancer  . Alzheimer's disease Maternal Grandmother   . Heart disease Brother 40    Myocardial Infarction     Current Outpatient Prescriptions on File Prior to Visit  Medication Sig Dispense Refill  . olmesartan-hydrochlorothiazide (BENICAR HCT) 40-25 MG per tablet Take 1 tablet by mouth daily.  30 tablet  11   No current facility-administered medications on file prior to visit.     Allergies  Allergen Reactions  . Sulfonamide Derivatives     REACTION: nausea     Immunization History  Administered Date(s) Administered  . Influenza Whole 12/26/2006, 11/19/2008  . Influenza-Unspecified 11/27/2012  . PPD Test 05/27/2012, 05/27/2012  . Tdap 05/23/2012       Objective:   Physical Exam  Vitals reviewed. Constitutional: She is oriented to person, place, and time. She appears well-nourished.  HENT:  Head: Normocephalic.  Eyes: Pupils are equal, round, and reactive to light.  Neck: Normal range of motion.  Cardiovascular: Normal rate.   Pulmonary/Chest: Effort normal.  Musculoskeletal: Normal range of motion.  Neurological: She is alert and oriented to person, place, and time.  Skin: Skin is warm  and dry.  Psychiatric: She has a normal mood and affect. Her behavior is normal. Judgment and thought content normal.       Assessment & Plan:    Alvetta was seen today for medication management.  Diagnoses and associated orders for this visit:  Essential hypertension, benign  Other and unspecified hyperlipidemia - rosuvastatin (CRESTOR) 40 MG tablet; Take 1/2 - 1 tab daily  Type II or unspecified type diabetes mellitus without mention of complication, not stated as uncontrolled - metFORMIN (GLUCOPHAGE-XR) 500 MG 24 hr tablet; Take 2 tablets (1,000 mg total) by mouth at bedtime. - Empagliflozin-Linagliptin (GLYXAMBI) 25-5 MG TABS; Take 1 tablet by mouth daily.  Anxiety state, unspecified - FLUoxetine (PROZAC) 20 MG  capsule; Take 1 capsule (20 mg total) by mouth daily.  Attention/concentration deficit, non-ADHD - Discontinue: lisdexamfetamine (VYVANSE) 20 MG capsule; Take 1 capsule (20 mg total) by mouth daily. - Discontinue: lisdexamfetamine (VYVANSE) 20 MG capsule; Take 1 capsule (20 mg total) by mouth daily. - lisdexamfetamine (VYVANSE) 20 MG capsule; Take 1 capsule (20 mg total) by mouth daily.   TIME SPENT "FACE TO FACE" WITH PATIENT -  30 MINS

## 2013-12-19 ENCOUNTER — Emergency Department (HOSPITAL_COMMUNITY): Payer: 59

## 2013-12-19 ENCOUNTER — Observation Stay (HOSPITAL_COMMUNITY)
Admission: EM | Admit: 2013-12-19 | Discharge: 2013-12-20 | Disposition: A | Discharge status: Home / Self Care | Payer: 59 | Attending: Cardiology | Admitting: Cardiology

## 2013-12-19 ENCOUNTER — Encounter (HOSPITAL_COMMUNITY): Payer: Self-pay | Admitting: *Deleted

## 2013-12-19 DIAGNOSIS — Z8619 Personal history of other infectious and parasitic diseases: Secondary | ICD-10-CM | POA: Insufficient documentation

## 2013-12-19 DIAGNOSIS — Z79899 Other long term (current) drug therapy: Secondary | ICD-10-CM | POA: Insufficient documentation

## 2013-12-19 DIAGNOSIS — R072 Precordial pain: Secondary | ICD-10-CM | POA: Diagnosis present

## 2013-12-19 DIAGNOSIS — F329 Major depressive disorder, single episode, unspecified: Secondary | ICD-10-CM | POA: Diagnosis not present

## 2013-12-19 DIAGNOSIS — M8580 Other specified disorders of bone density and structure, unspecified site: Secondary | ICD-10-CM | POA: Diagnosis not present

## 2013-12-19 DIAGNOSIS — M549 Dorsalgia, unspecified: Secondary | ICD-10-CM | POA: Diagnosis present

## 2013-12-19 DIAGNOSIS — I10 Essential (primary) hypertension: Secondary | ICD-10-CM | POA: Diagnosis not present

## 2013-12-19 DIAGNOSIS — Z7982 Long term (current) use of aspirin: Secondary | ICD-10-CM | POA: Insufficient documentation

## 2013-12-19 DIAGNOSIS — R110 Nausea: Secondary | ICD-10-CM | POA: Insufficient documentation

## 2013-12-19 DIAGNOSIS — R6884 Jaw pain: Secondary | ICD-10-CM | POA: Diagnosis not present

## 2013-12-19 DIAGNOSIS — F909 Attention-deficit hyperactivity disorder, unspecified type: Secondary | ICD-10-CM | POA: Diagnosis not present

## 2013-12-19 DIAGNOSIS — Z8632 Personal history of gestational diabetes: Secondary | ICD-10-CM | POA: Diagnosis not present

## 2013-12-19 DIAGNOSIS — R079 Chest pain, unspecified: Secondary | ICD-10-CM | POA: Diagnosis not present

## 2013-12-19 DIAGNOSIS — E785 Hyperlipidemia, unspecified: Secondary | ICD-10-CM | POA: Insufficient documentation

## 2013-12-19 HISTORY — DX: Gestational diabetes mellitus in pregnancy, unspecified control: O24.419

## 2013-12-19 HISTORY — DX: Type 2 diabetes mellitus without complications: E11.9

## 2013-12-19 HISTORY — DX: Pneumonia, unspecified organism: J18.9

## 2013-12-19 LAB — CBC WITH DIFFERENTIAL/PLATELET
Basophils Absolute: 0.1 10*3/uL (ref 0.0–0.1)
Basophils Relative: 1 % (ref 0–1)
Eosinophils Absolute: 0.1 10*3/uL (ref 0.0–0.7)
Eosinophils Relative: 1 % (ref 0–5)
HCT: 41.6 % (ref 36.0–46.0)
Hemoglobin: 14.1 g/dL (ref 12.0–15.0)
Lymphocytes Relative: 24 % (ref 12–46)
Lymphs Abs: 2.2 10*3/uL (ref 0.7–4.0)
MCH: 28 pg (ref 26.0–34.0)
MCHC: 33.9 g/dL (ref 30.0–36.0)
MCV: 82.7 fL (ref 78.0–100.0)
Monocytes Absolute: 0.5 10*3/uL (ref 0.1–1.0)
Monocytes Relative: 5 % (ref 3–12)
Neutro Abs: 6.2 10*3/uL (ref 1.7–7.7)
Neutrophils Relative %: 69 % (ref 43–77)
Platelets: 286 10*3/uL (ref 150–400)
RBC: 5.03 MIL/uL (ref 3.87–5.11)
RDW: 12.8 % (ref 11.5–15.5)
WBC: 9 10*3/uL (ref 4.0–10.5)

## 2013-12-19 LAB — BASIC METABOLIC PANEL
Anion gap: 15 (ref 5–15)
BUN: 11 mg/dL (ref 6–23)
CO2: 28 mEq/L (ref 19–32)
Calcium: 9.7 mg/dL (ref 8.4–10.5)
Chloride: 97 mEq/L (ref 96–112)
Creatinine, Ser: 0.72 mg/dL (ref 0.50–1.10)
GFR calc Af Amer: >90 mL/min (ref >90)
GFR calc non Af Amer: >90 mL/min (ref >90)
Glucose, Bld: 158 mg/dL — ABNORMAL HIGH (ref 70–99)
Potassium: 3.2 mEq/L — ABNORMAL LOW (ref 3.7–5.3)
Sodium: 140 mEq/L (ref 137–147)

## 2013-12-19 LAB — I-STAT TROPONIN, ED: Troponin i, poc: 0.04 ng/mL (ref 0.00–0.08)

## 2013-12-19 LAB — TROPONIN I: Troponin I: <0.3 ng/mL (ref <0.30)

## 2013-12-19 IMAGING — DX DG CHEST 2V
2 series · 2 of 2 positions shown · non-contrast
Comparison: [DATE] and [DATE]

CLINICAL DATA: Intermittent chest pain 2 months. Mid to upper back
pain between shoulder blades 2 weeks ago. Shortness of breath.

EXAM:
CHEST  2 VIEW

[chest pa]
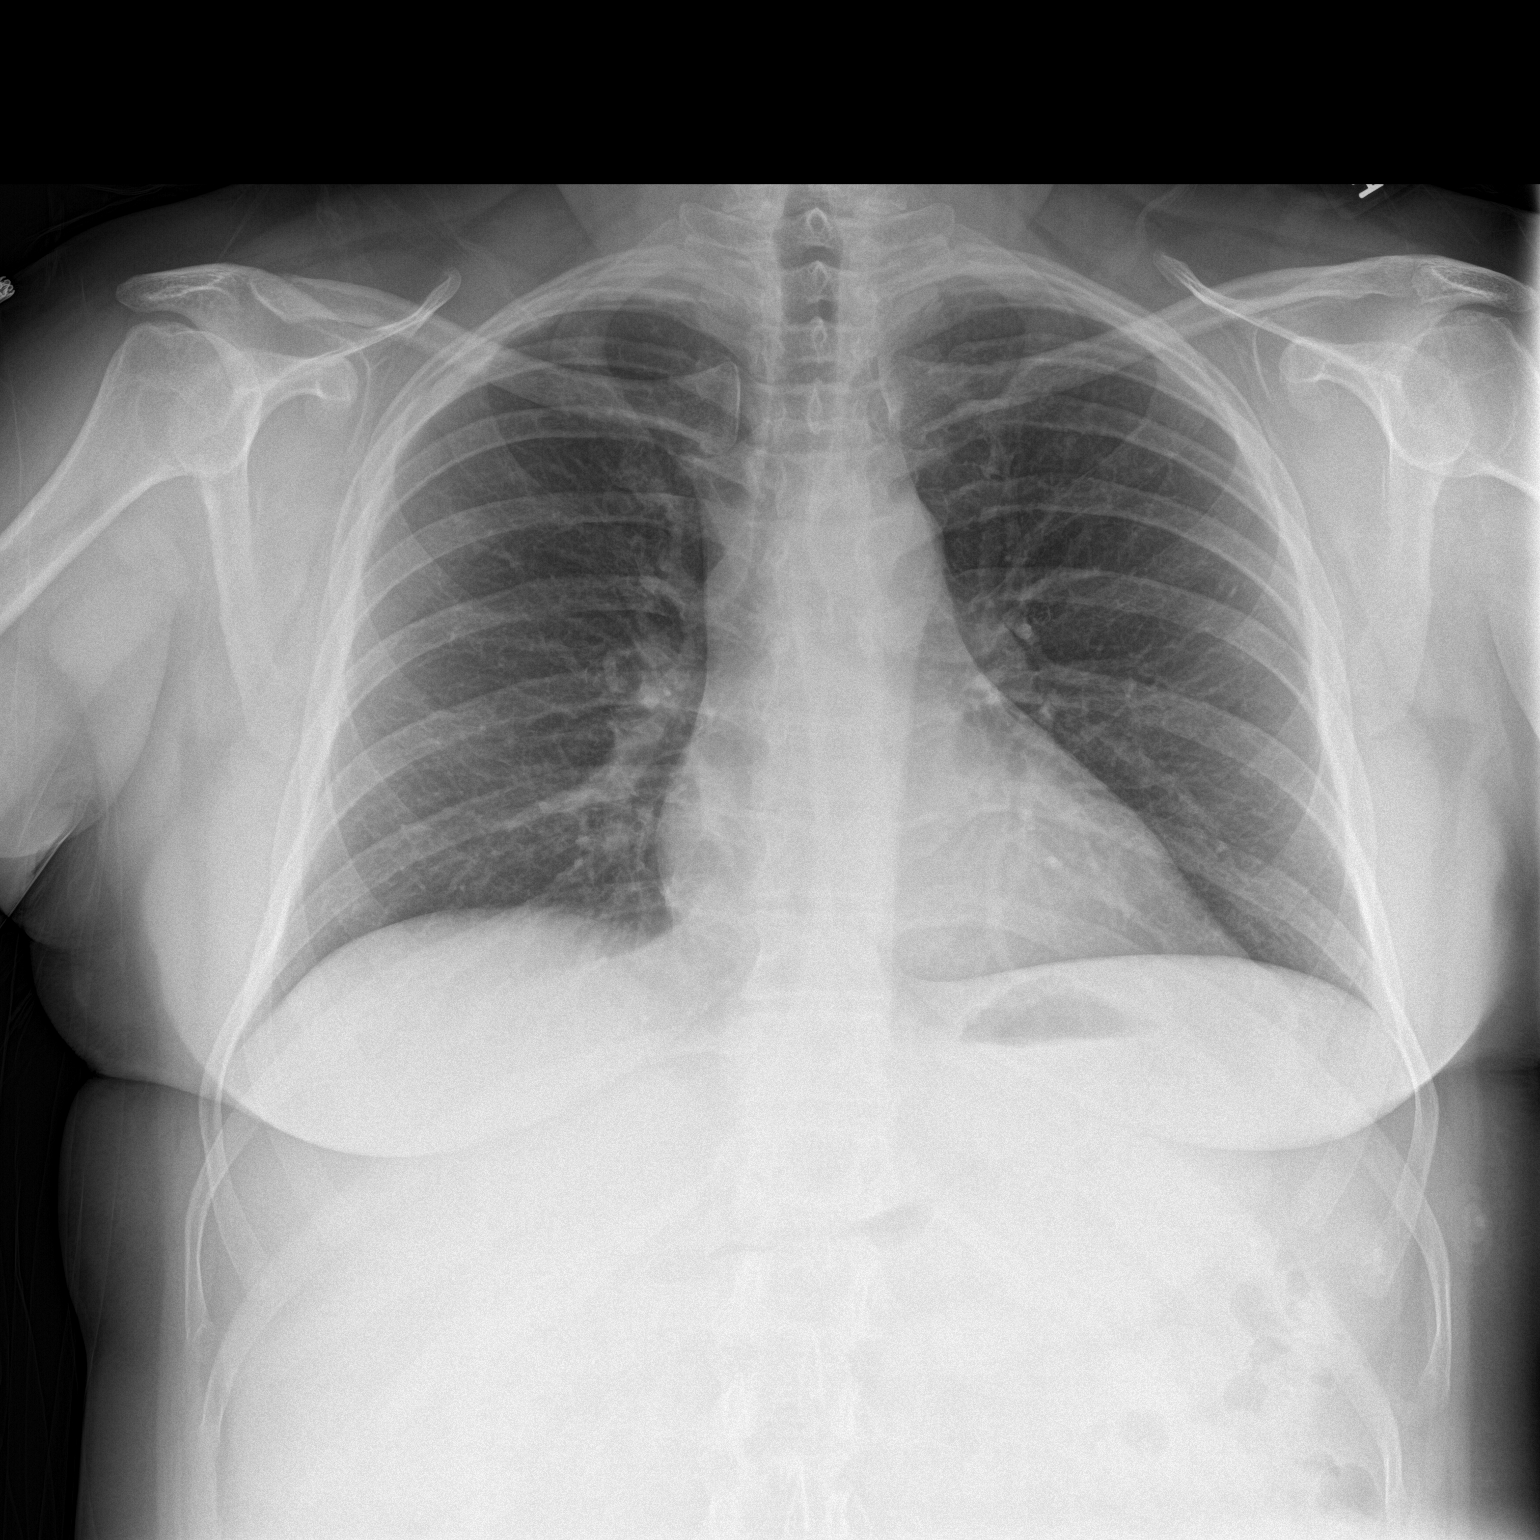

[chest lat]
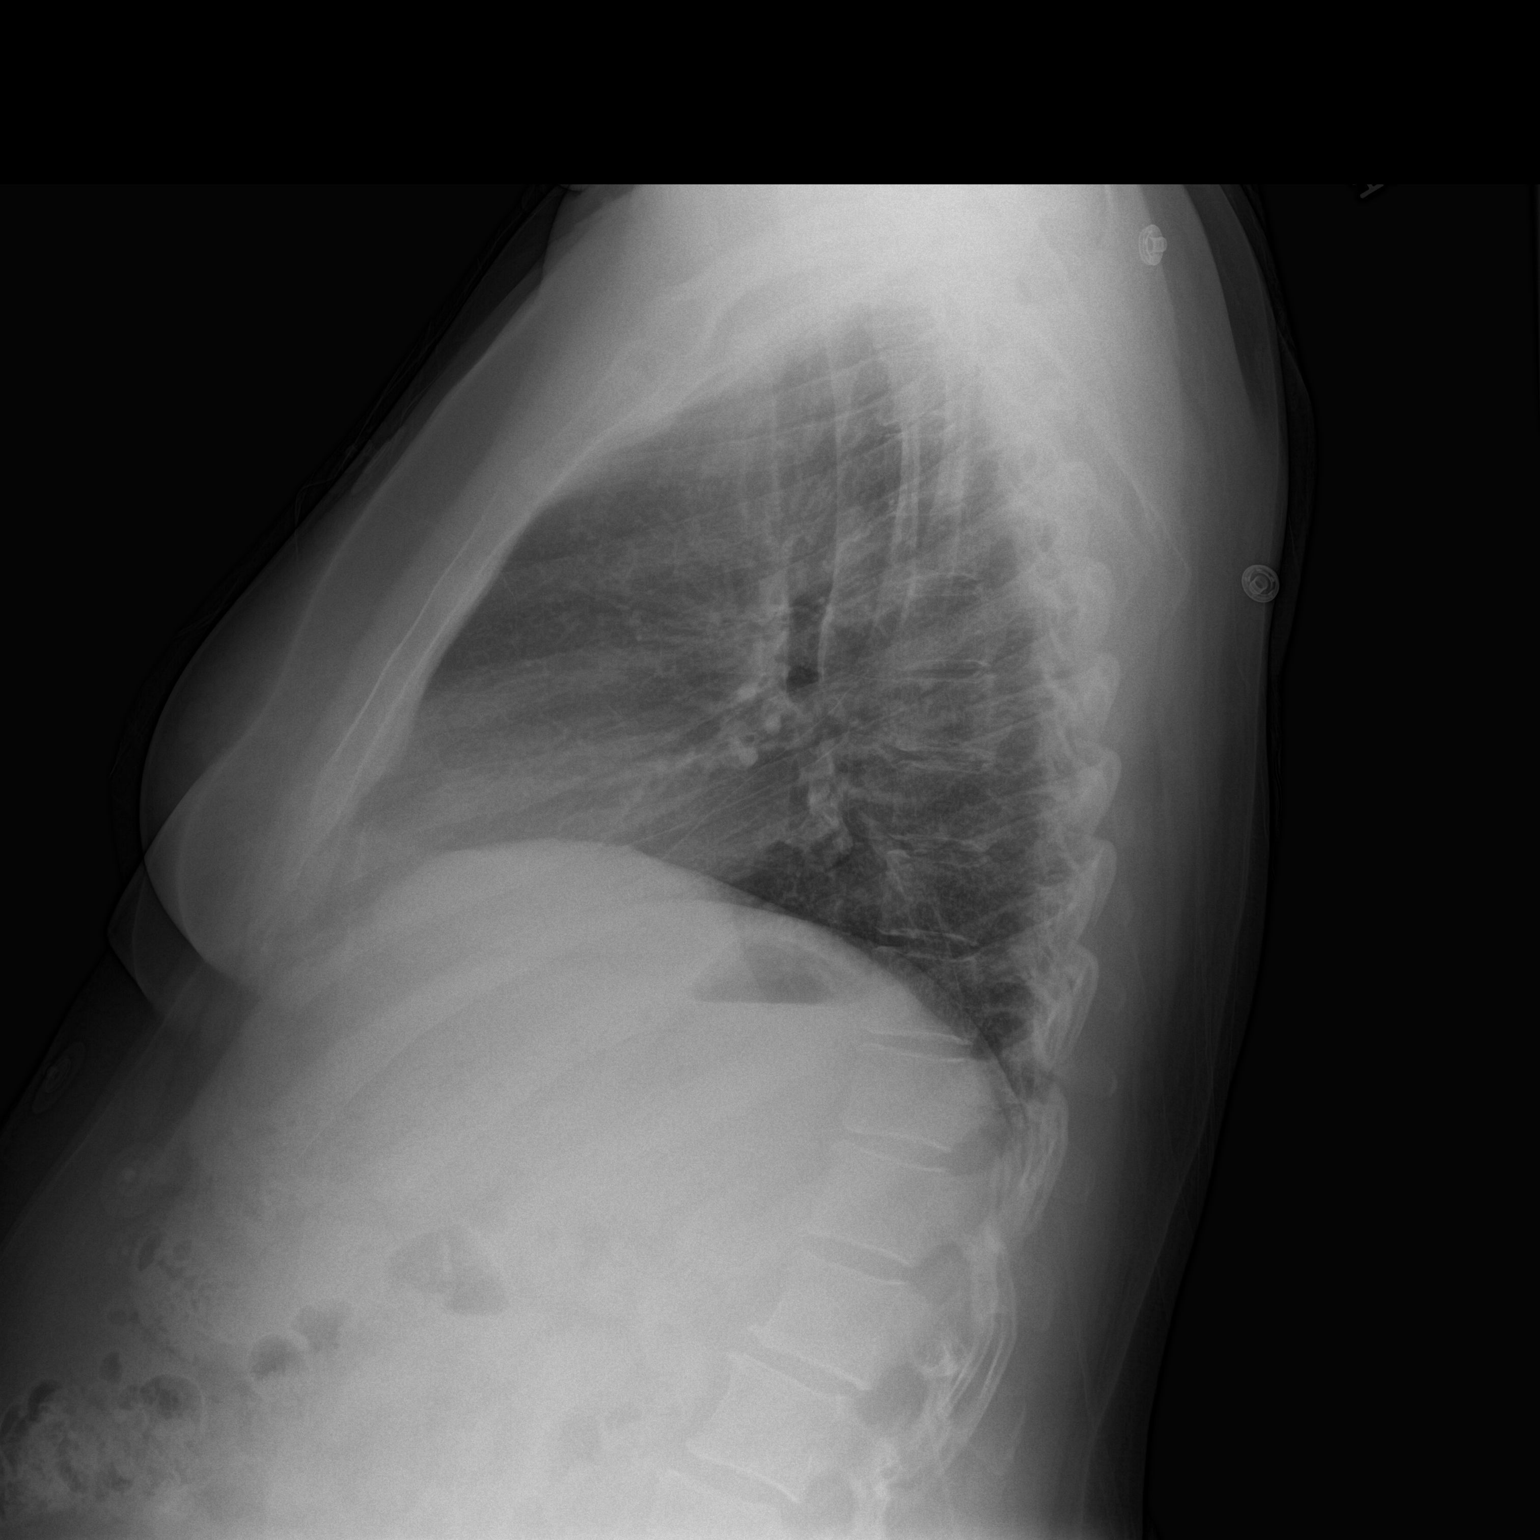

[2 of 2 positions shown; findings below may reference images not displayed]

FINDINGS: Lungs are somewhat hypoinflated without consolidation or effusion.
Cardiomediastinal silhouette and remainder of the exam is unchanged.
IMPRESSION: No active cardiopulmonary disease.

## 2013-12-19 MED ORDER — ASPIRIN 81 MG PO CHEW
324.0000 mg | CHEWABLE_TABLET | Freq: Once | ORAL | Status: AC
  Administered 2013-12-19: 324 mg via ORAL
  Filled 2013-12-19: qty 4

## 2013-12-19 MED ORDER — ACETAMINOPHEN 325 MG PO TABS
650.0000 mg | ORAL_TABLET | ORAL | Status: DC | PRN
  Administered 2013-12-19: 650 mg via ORAL
  Filled 2013-12-19: qty 2

## 2013-12-19 MED ORDER — ASPIRIN 81 MG PO CHEW: 81.0000 mg | CHEWABLE_TABLET | Freq: Every day | ORAL | Status: DC | PRN

## 2013-12-19 MED ORDER — IRBESARTAN 150 MG PO TABS
300.0000 mg | ORAL_TABLET | Freq: Every day | ORAL | Status: DC
  Administered 2013-12-20: 300 mg via ORAL
  Filled 2013-12-19: qty 2

## 2013-12-19 MED ORDER — LISDEXAMFETAMINE DIMESYLATE 20 MG PO CAPS: 20.0000 mg | ORAL_CAPSULE | Freq: Every day | ORAL | Status: DC

## 2013-12-19 MED ORDER — HEPARIN SODIUM (PORCINE) 5000 UNIT/ML IJ SOLN
5000.0000 [IU] | Freq: Three times a day (TID) | INTRAMUSCULAR | Status: DC
  Administered 2013-12-19 – 2013-12-20 (×2): 5000 [IU] via SUBCUTANEOUS
  Filled 2013-12-19 (×2): qty 1

## 2013-12-19 MED ORDER — HYDROCHLOROTHIAZIDE 25 MG PO TABS
25.0000 mg | ORAL_TABLET | Freq: Every day | ORAL | Status: DC
  Administered 2013-12-20: 25 mg via ORAL
  Filled 2013-12-19: qty 1

## 2013-12-19 MED ORDER — ONDANSETRON HCL 4 MG/2ML IJ SOLN: 4.0000 mg | Freq: Four times a day (QID) | INTRAMUSCULAR | Status: DC | PRN

## 2013-12-19 MED ORDER — NITROGLYCERIN 0.4 MG SL SUBL
0.4000 mg | SUBLINGUAL_TABLET | SUBLINGUAL | Status: DC | PRN
  Filled 2013-12-19: qty 1

## 2013-12-19 MED ORDER — OLMESARTAN MEDOXOMIL-HCTZ 40-25 MG PO TABS: 1.0000 | ORAL_TABLET | Freq: Every day | ORAL | Status: DC

## 2013-12-19 MED ORDER — ROSUVASTATIN CALCIUM 10 MG PO TABS: 40.0000 mg | ORAL_TABLET | Freq: Every day | ORAL | Status: DC

## 2013-12-19 MED ORDER — INSULIN ASPART 100 UNIT/ML ~~LOC~~ SOLN
0.0000 [IU] | Freq: Three times a day (TID) | SUBCUTANEOUS | Status: DC
  Administered 2013-12-20: 7 [IU] via SUBCUTANEOUS

## 2013-12-19 MED ORDER — FLUOXETINE HCL 20 MG PO CAPS
20.0000 mg | ORAL_CAPSULE | Freq: Every day | ORAL | Status: DC
  Administered 2013-12-20: 20 mg via ORAL
  Filled 2013-12-19: qty 1

## 2013-12-19 NOTE — ED Notes (Signed)
Report attempt x 1 made

## 2013-12-19 NOTE — H&P (Signed)
Patient ID: Lindsay Rios MRN: [id number], DOB/AGE: [birth year]   Admit date: 12/19/2013   Primary Physician: Ival Bible, MD Primary Cardiologist: New  Pt. Profile:  obese 48 year old Caucasian female with past medical history of hypertension, hyperlipidemia, diabetes, ADD presented with CP. Of note, she has no prior cardiac history, however has significant family history of early coronary disease. Her chest discomfort is concerning. She feels a tight sensation across her shoulder blades. This is followed by radiation to the jaw. When it is severe she feels it in her neck. This can occur both at rest and with stress. She says that it will occur repeatedly with any significant exercise.  Problem List  Past Medical History  Diagnosis Date  . Hypertension   . Depression   . Hyperlipidemia   . ADD (attention deficit disorder)   . Hx gestational diabetes   . Osteopenia   . 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 Abdominal hysterectomy      HPV/Cervical Dysplasia     Allergies  Allergies  Allergen Reactions  . Sulfonamide Derivatives     REACTION: nausea    HPI  The patient is obese 48 year old Caucasian Caucasian female with past medical history of hypertension, hyperlipidemia, diabetes, ADD, however no prior cardiac history. She has significant family history of early coronary disease with her father had first MI at age 67 and her brother had MI in her early 28s as well. 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 She denies any recent fever, chill, lower extremity edema, orthopnea or paroxysmal nocturnal dyspnea. She states for the past 2 month, she's been having intermittent pain between her shoulder blade, chest pressure radiating to bilateral neck and jaw. She states the symptoms usually last from a few minutes up to a hour. Associated symptoms  including nausea and diaphoresis. She states the chest discomfort occur both at rest and with exertion, however more common during exertion. She denies any significant exacerbating or alleviating factors. She states 2 weeks ago, while holding a friend setting up a curb side stand, she had significant chest pain, diaphoresis and dizziness where she had to lay on the side of the road for several minutes. She did not seek medical attention at that time as it was late at night.  Patient presented to [facility] in the morning of 12/19/2013 for further evaluation of recurrent chest pain. On arrival patient's blood pressure is 110/70. O2 saturation 95% on room air. Heart rate in the 80 to 90s. Significant laboratory finding include potassium of 3.2, creatinine of 0.72, negative initial troponin, normal CBC. Chest x-ray was negative for acute process. EKG showed normal sinus rhythm without significant ST-T wave changes. Cardiology has been consulted for chest pain.  Home Medications  Prior to Admission medications   Medication Sig Start Date End Date Taking? Authorizing Provider  aspirin 81 MG chewable tablet Chew 81 mg by mouth daily as needed for mild pain.   Yes Historical Provider, MD  FLUoxetine (PROZAC) 20 MG capsule Take 1 capsule (20 mg total) by mouth daily. 08/11/13 08/11/14 Yes Jonathon Resides, MD  lisdexamfetamine (VYVANSE) 20 MG capsule Take 1 capsule (20 mg total) by mouth daily. 08/11/13  Yes Jonathon Resides, MD  metFORMIN (GLUCOPHAGE-XR) 500 MG 24 hr tablet Take 500 mg by mouth daily.  Yes Historical Provider, MD  olmesartan-hydrochlorothiazide (BENICAR HCT) 40-25 MG per tablet Take 1 tablet by mouth daily. 01/01/13 01/01/14 Yes Jonathon Resides, MD  rosuvastatin (CRESTOR) 40 MG tablet Take 1/2 - 1 tab daily 08/11/13 08/11/14 Yes Jonathon Resides, MD  Empagliflozin-Linagliptin (GLYXAMBI) 25-5 MG TABS Take 1 tablet by mouth daily. Patient not taking: Reported on 12/19/2013 08/11/13 08/12/14  Jonathon Resides, MD  metFORMIN (GLUCOPHAGE-XR) 500 MG 24 hr tablet Take 2 tablets (1,000 mg total) by mouth at bedtime. 08/11/13 12/13/13  Jonathon Resides, MD    Family History  Family History  Problem Relation Age of Onset  . Hyperlipidemia Mother   . Hyperlipidemia Father   . Hypertension Father   . Diabetes Father   . Heart disease Father   . Cancer Maternal Grandmother     Colon Cancer  . Alzheimer's disease Maternal Grandmother   . Heart disease Brother 43    Myocardial Infarction    Social History  History   Social History  . Marital Status: Married    Spouse Name: [name] (Separated)     Number of Children: 2  . Years of Education: 13+    Occupational History  . Homemaker    . Student      [employer]    Social History Main Topics  . Smoking status: Never Smoker   . Smokeless tobacco: Never Used  . Alcohol Use: Yes     Comment: Rarely   . Drug Use: No  . Sexual Activity:    Partners: Male   Other Topics Concern  . Not on file   Social History Narrative   Marital Status: Separated [name]) Significant Other [name])    Children:  Son (1) Daughter (1)    Pets: Dogs (2), Cat (1)    Living Situation: Lives with children.   Occupation: Full- Time Student    Education: [employer]    Tobacco Use: She has never smoked.     Alcohol Use:  Rarely   Drug Use:  None   Diet:  Regular   Exercise:  None   Hobbies: Shopping           Review of Systems General:  No chills, fever, night sweats or weight changes.  Cardiovascular:  No dyspnea on exertion, edema, orthopnea, palpitations, paroxysmal nocturnal dyspnea.  +chest pain, diaphoresis, dizziness Dermatological: No rash, lesions/masses Respiratory: No cough, dyspnea Urologic: No hematuria, dysuria Abdominal:   No vomiting, diarrhea, bright red blood per rectum, melena, or hematemesis +nausea Neurologic:  No visual changes, wkns, changes in mental status. All other systems reviewed and are otherwise negative except as  noted above.  Physical Exam  Blood pressure 107/63, pulse 81, temperature 97.6 F (36.4 C), temperature source Oral, resp. rate 18, SpO2 92 %.  General: Pleasant, NAD Psych: Normal affect. Neuro: Alert and oriented X 3. Moves all extremities spontaneously. HEENT: Normal  Neck: Supple without bruits or JVD. Lungs:  Resp regular and unlabored, CTA. Heart: RRR no s3, s4, or murmurs. Abdomen: Soft, non-tender, non-distended, BS + x 4.  Extremities: No clubbing, cyanosis or edema. DP/PT/Radials 2+ and equal bilaterally.  Labs  Troponin (Point of Care Test)  Recent Labs  12/19/13 1120  TROPIPOC 0.04   No results for input(s): CKTOTAL, CKMB, TROPONINI in the last 72 hours. Lab Results  Component Value Date   WBC 9.0 12/19/2013   HGB 14.1 12/19/2013   HCT 41.6 12/19/2013   MCV 82.7 12/19/2013   PLT 286  12/19/2013    Recent Labs Lab 12/19/13 1034  NA 140  K 3.2*  CL 97  CO2 28  BUN 11  CREATININE 0.72  CALCIUM 9.7  GLUCOSE 158*   Lab Results  Component Value Date   CHOL 156 08/31/2011   HDL 47 08/31/2011   LDLCALC 58 03/26/2013   TRIG 135 03/26/2013   Lab Results  Component Value Date   DDIMER  08/20/2006    <0.22        AT THE INHOUSE ESTABLISHED CUTOFF VALUE OF 0.48 ug/mL FEU, THIS ASSAY HAS BEEN DOCUMENTED IN THE LITERATURE TO HAVE     Radiology/Studies  Dg Chest 2 View  12/19/2013   CLINICAL DATA:  Intermittent chest pain 2 months. Mid to upper back pain between shoulder blades 2 weeks ago. Shortness of breath.  EXAM: CHEST  2 VIEW  COMPARISON:  01/02/2010 and 08/20/2006  FINDINGS: Lungs are somewhat hypoinflated without consolidation or effusion. Cardiomediastinal silhouette and remainder of the exam is unchanged.  IMPRESSION: No active cardiopulmonary disease.   Electronically Signed   By: Marin Olp M.D.   On: 12/19/2013 11:33    ECG  Normal sinus rhythm. There are nonspecific ST-T wave changes.   ASSESSMENT AND PLAN  1. Chest pain  -  Cardiac risk factors include hypertension, hyperlipidemia, diabetes, early family history of coronary artery disease  - Presentation concerning for progressive angina, given cardiac risk factors, will discuss with Dr. Ron Parker regarding cardiac catheterization. (KATZ: I discussed fully with Mr. Eulas Post. I feel that we should proceed with cardiac catheterization.  - contacted cath lab, no opening for cath today. Dicussed with Dr. Ron Parker, will admit overnight, if serial enzyme neg, will need to set outpatient cath early next week. If serial enzyme positive, add IV heparin and cath on Monday. (KATZ: Please continue to read my extended note below.  KATZ  2. Hypertension 3. Hyperlipidemia 4. DM 5. ADD  Signed, Almyra Deforest, PA-C 12/19/2013, 2:55 PM Patient seen and examined. I agree with the assessment and plan as detailed above. See also my additional thoughts below.   I spent a long time talking with the patient. I am concerned about her symptoms. Her troponins are normal so far. She has a significantly positive family history and other significant risk factors. I decided to admit her overnight to check her enzymes further. Also cardiac meds will be added to her regimen. If her enzymes are negative and she is completely stable tomorrow morning, I feel she can be discharged with plans for her to return for cardiac catheterization on Monday, December 22, 2013. I have spoken with Wannetta Sender to make arrangements with the cath lab. I will call the team Saturday morning to be sure that they are aware of this plan.  Dola Argyle, MD, [facility] 12/19/2013 4:42 PM

## 2013-12-19 NOTE — ED Notes (Signed)
Pt reports episodes of back pain between her shoulder blades, mid chest tightness and pain into her jaw, generalized fatigue.  No acute distress noted at triage, ekg done.

## 2013-12-19 NOTE — ED Provider Notes (Signed)
CSN: [id number]     Arrival date & time 12/19/13  1025 History   First MD Initiated Contact with Patient 12/19/13 1036     Chief Complaint  Patient presents with  . Back Pain  . Jaw Pain     (Consider location/radiation/quality/duration/timing/severity/associated sxs/prior Treatment) HPI Comments: Patient states her symptoms have been waxing and waning over the last 2 months however they are becoming more frequent. She had an appointment to see her doctor for further evaluation however the appointment was canceled and she cannot see them until next week and the pain has worsened. She doesn't get it at night it will wake her up but she denies any relationship to eating or food. She denies any cough or fever. The pain always starts in her back but will radiate to her chest and into her jaw bilaterally. At various times shortness of breath and nausea. Approximate 2 weeks as she was helping a friend move and got severe pain at that time causing her to be near syncopal, diaphoretic and short of breath.  Patient is a 48 y.o. female presenting with chest pain. The history is provided by the patient.  Chest Pain Chest pain location: starts between the shoulder blades and will radiate to the front of chest  Pain quality: aching and pressure   Pain radiates to:  L jaw, R jaw and mid back Pain radiates to the back: yes   Pain severity:  Moderate Onset quality:  Gradual Duration:  30 minutes Timing:  Intermittent Progression:  Partially resolved Chronicity:  New Context comment:  Approximately about 2 months ago patient started to have symptoms of pain between her shoulder blades which would radiate to her chest and in her jaw. These episodes were infrequent but they have been becoming more regular. Relieved by:  Aspirin Worsened by:  Exertion Ineffective treatments:  None tried Associated symptoms: back pain, dizziness, nausea and shortness of breath   Associated symptoms: no abdominal pain, no  anorexia, no headache, no lower extremity edema and not vomiting   Risk factors: diabetes mellitus, high cholesterol and hypertension   Risk factors: no immobilization, no smoking and no surgery   Risk factors comment:  Family hx of dad with MI at 22 and brother with MI at 50 requiring 2 stents   Past Medical History  Diagnosis Date  . Hypertension   . Depression   . Hyperlipidemia   . ADD (attention deficit disorder)   . Hx gestational diabetes   . Osteopenia   . Human papilloma virus     High Risk    Past Surgical History  Procedure Laterality Date  . Abdominal hysterectomy      HPV/Cervical Dysplasia   Family History  Problem Relation Age of Onset  . Hyperlipidemia Mother   . Hyperlipidemia Father   . Hypertension Father   . Diabetes Father   . Heart disease Father   . Cancer Maternal Grandmother     Colon Cancer  . Alzheimer's disease Maternal Grandmother   . Heart disease Brother 40    Myocardial Infarction   History  Substance Use Topics  . Smoking status: Never Smoker   . Smokeless tobacco: Never Used  . Alcohol Use: Yes     Comment: Rarely    OB History    No data available     Review of Systems  Respiratory: Positive for shortness of breath.   Cardiovascular: Positive for chest pain.  Gastrointestinal: Positive for nausea. Negative for vomiting, abdominal pain  and anorexia.  Musculoskeletal: Positive for back pain.  Neurological: Positive for dizziness. Negative for headaches.  All other systems reviewed and are negative.     Allergies  Sulfonamide derivatives  Home Medications   Prior to Admission medications   Medication Sig Start Date End Date Taking? Authorizing Provider  aspirin 81 MG chewable tablet Chew 81 mg by mouth daily as needed for mild pain.   Yes Historical Provider, MD  FLUoxetine (PROZAC) 20 MG capsule Take 1 capsule (20 mg total) by mouth daily. 08/11/13 08/11/14 Yes Jonathon Resides, MD  lisdexamfetamine (VYVANSE) 20 MG capsule  Take 1 capsule (20 mg total) by mouth daily. 08/11/13  Yes Jonathon Resides, MD  metFORMIN (GLUCOPHAGE-XR) 500 MG 24 hr tablet Take 500 mg by mouth daily.   Yes Historical Provider, MD  olmesartan-hydrochlorothiazide (BENICAR HCT) 40-25 MG per tablet Take 1 tablet by mouth daily. 01/01/13 01/01/14 Yes Jonathon Resides, MD  rosuvastatin (CRESTOR) 40 MG tablet Take 1/2 - 1 tab daily 08/11/13 08/11/14 Yes Jonathon Resides, MD  Empagliflozin-Linagliptin (GLYXAMBI) 25-5 MG TABS Take 1 tablet by mouth daily. Patient not taking: Reported on 12/19/2013 08/11/13 08/12/14  Jonathon Resides, MD  metFORMIN (GLUCOPHAGE-XR) 500 MG 24 hr tablet Take 2 tablets (1,000 mg total) by mouth at bedtime. 08/11/13 12/13/13  Jonathon Resides, MD   BP 112/72 mmHg  Pulse 81  Temp(Src) 97.6 F (36.4 C) (Oral)  Resp 17  SpO2 94% Physical Exam  Constitutional: She is oriented to person, place, and time. She appears well-developed and well-nourished. No distress.  HENT:  Head: Normocephalic and atraumatic.  Mouth/Throat: Oropharynx is clear and moist.  Eyes: Conjunctivae and EOM are normal. Pupils are equal, round, and reactive to light.  Neck: Normal range of motion. Neck supple.  Cardiovascular: Normal rate, regular rhythm and intact distal pulses.   No murmur heard. Pulmonary/Chest: Effort normal and breath sounds normal. No respiratory distress. She has no wheezes. She has no rales. She exhibits no tenderness.  Abdominal: Soft. She exhibits no distension. There is no tenderness. There is no rebound and no guarding.  Musculoskeletal: Normal range of motion. She exhibits no edema or tenderness.  Neurological: She is alert and oriented to person, place, and time.  Skin: Skin is warm and dry. No rash noted. No erythema.  Psychiatric: She has a normal mood and affect. Her behavior is normal.  Nursing note and vitals reviewed.   ED Course  Procedures (including critical care time) Labs Review Labs Reviewed  BASIC METABOLIC PANEL -  Abnormal; Notable for the following:    Potassium 3.2 (*)    Glucose, Bld 158 (*)    All other components within normal limits  CBC WITH DIFFERENTIAL  Randolm Idol, ED    Imaging Review Dg Chest 2 View  12/19/2013   CLINICAL DATA:  Intermittent chest pain 2 months. Mid to upper back pain between shoulder blades 2 weeks ago. Shortness of breath.  EXAM: CHEST  2 VIEW  COMPARISON:  01/02/2010 and 08/20/2006  FINDINGS: Lungs are somewhat hypoinflated without consolidation or effusion. Cardiomediastinal silhouette and remainder of the exam is unchanged.  IMPRESSION: No active cardiopulmonary disease.   Electronically Signed   By: Marin Olp M.D.   On: 12/19/2013 11:33     EKG Interpretation   Date/Time:  Friday December 19 2013 10:28:06 EST Ventricular Rate:  98 PR Interval:  168 QRS Duration: 84 QT Interval:  360 QTC Calculation: 459 R Axis:   72 Text  Interpretation:  Normal sinus rhythm Low voltage QRS Nonspecific T  wave abnormality No significant change since last tracing Confirmed by  Maryan Rued  MD, Loree Fee ([id number]) on 12/19/2013 10:35:15 AM      MDM   Final diagnoses:  Chest pain    Pt with symptoms concerning for ACS.  Heart score of 4 making her moderate risk. Associated symptoms include nausea, SOB and breaking out in a cold sweat occassionally.  PERC neg. Currently having some jaw tightness and back pain here.  ASA and NTG given. EKG with nonspecific t-wave change, CXR wnl, CBC, BMP, troponin pending.  1:52 PM Pt with normal labs however given hx and concerning story will have cards see.  Currently pt pain free from ASA.   Blanchie Dessert, MD 12/19/13 1353

## 2013-12-19 NOTE — ED Notes (Signed)
Transporting patient to new room assignment. 

## 2013-12-19 NOTE — ED Notes (Signed)
Dr. Plunkett at bedside.  

## 2013-12-19 NOTE — Progress Notes (Signed)
Pt will be ruled out for MI over night. If neg trops and pain free pt will be discharged home and return on Monday for outpt cath . If rules in, pt will stay in the hospital  for cath on Monday. Pt is schedule for a Left Heart cath on Monday 12/22/13 @1300  with Dr Irish Lack. If pt is discharged over the weekend, she will need to return to the [facility] on Monday at 11am. Dr Ron Parker has discussed the cath including the risk and benefits with the pt. Pt agrees to proceed with cath on Monday either as an inpt or outpt.  Daryel November, MD

## 2013-12-20 DIAGNOSIS — R110 Nausea: Secondary | ICD-10-CM | POA: Diagnosis not present

## 2013-12-20 DIAGNOSIS — F329 Major depressive disorder, single episode, unspecified: Secondary | ICD-10-CM | POA: Diagnosis not present

## 2013-12-20 DIAGNOSIS — I10 Essential (primary) hypertension: Secondary | ICD-10-CM | POA: Diagnosis not present

## 2013-12-20 DIAGNOSIS — R079 Chest pain, unspecified: Secondary | ICD-10-CM | POA: Diagnosis not present

## 2013-12-20 LAB — BASIC METABOLIC PANEL
Anion gap: 14 (ref 5–15)
BUN: 15 mg/dL (ref 6–23)
CO2: 29 mEq/L (ref 19–32)
Calcium: 9.5 mg/dL (ref 8.4–10.5)
Chloride: 95 mEq/L — ABNORMAL LOW (ref 96–112)
Creatinine, Ser: 0.82 mg/dL (ref 0.50–1.10)
GFR calc Af Amer: >90 mL/min (ref >90)
GFR calc non Af Amer: 85 mL/min — ABNORMAL LOW (ref >90)
Glucose, Bld: 127 mg/dL — ABNORMAL HIGH (ref 70–99)
Potassium: 3.7 mEq/L (ref 3.7–5.3)
Sodium: 138 mEq/L (ref 137–147)

## 2013-12-20 LAB — GLUCOSE, CAPILLARY: Glucose-Capillary: 207 mg/dL — ABNORMAL HIGH (ref 70–99)

## 2013-12-20 LAB — HEMOGLOBIN A1C
Hgb A1c MFr Bld: 6.4 % — ABNORMAL HIGH (ref <5.7)
Mean Plasma Glucose: 137 mg/dL — ABNORMAL HIGH (ref <117)

## 2013-12-20 LAB — TROPONIN I
Troponin I: <0.3 ng/mL (ref <0.30)
Troponin I: <0.3 ng/mL (ref <0.30)

## 2013-12-20 MED ORDER — NITROGLYCERIN 0.4 MG SL SUBL: 0.4000 mg | SUBLINGUAL_TABLET | SUBLINGUAL | Status: DC | PRN

## 2013-12-20 MED ORDER — POTASSIUM CHLORIDE 20 MEQ/15ML (10%) PO SOLN
40.0000 meq | Freq: Once | ORAL | Status: AC
  Administered 2013-12-20: 40 meq via ORAL
  Filled 2013-12-20: qty 30

## 2013-12-20 NOTE — Progress Notes (Signed)
UR comleted.

## 2013-12-20 NOTE — Progress Notes (Signed)
  Echocardiogram 2D Echocardiogram has been performed.  Lindsay Rios 12/20/2013, 11:00 AM

## 2013-12-20 NOTE — Progress Notes (Addendum)
Subjective:  No chest pain overnight.  All troponins are negative.  Objective:  Vital Signs in the last 24 hours: BP 113/62 mmHg  Pulse 71  Temp(Src) 98.5 F (36.9 C) (Oral)  Resp 18  Ht 5\' 2"  (1.575 m)  Wt 76.755 kg (169 lb 3.4 oz)  BMI 30.94 kg/m2  SpO2 93%  Physical Exam: Pleasant female in no acute distress Lungs:  Clear  Cardiac:  Regular rhythm, normal S1 and S2, no S3  Intake/Output from previous day:   Weight Filed Weights   12/19/13 2007 12/20/13 0434  Weight: 76.839 kg (169 lb 6.4 oz) 76.755 kg (169 lb 3.4 oz)    Lab Results: Basic Metabolic Panel:  Recent Labs  12/19/13 1034  NA 140  K 3.2*  CL 97  CO2 28  GLUCOSE 158*  BUN 11  CREATININE 0.72    CBC:  Recent Labs  12/19/13 1034  WBC 9.0  NEUTROABS 6.2  HGB 14.1  HCT 41.6  MCV 82.7  PLT 286   Telemetry: Sinus rhythm  Assessment/Plan:  1.  Chest pain-possible angina 2.  Hypertension. 3.  Diabetes mellitus. 4.  Hypokalemia  Recommendations:  Okay for discharge with plans to return for catheterization on Monday.  Will replete potassium today and may need potassium on discharge.      Kerry Hough  MD [facility]  12/20/2013, 8:35 AM

## 2013-12-20 NOTE — Plan of Care (Signed)
Problem: Phase I Progression Outcomes Goal: Hemodynamically stable Outcome: Completed/Met Date Met:  12/20/13 Goal: Anginal pain relieved Outcome: Completed/Met Date Met:  12/20/13

## 2013-12-20 NOTE — Discharge Summary (Signed)
Physician Discharge Summary  Patient ID: Lindsay Rios MRN: [id number] DOB/AGE: Oct 30, 1965 48 y.o.  Admit date: 12/19/2013 Discharge date: 12/20/2013  Primary Discharge Diagnosis: 1. Unstable Angina    A: Plan for cath on 12/22/2013 2. Hypertension:   Secondary Discharge Diagnosis 1. Diabetes 2. Hypokalemia  Primary Cardiologist: Cleatis Polka MD  Significant Diagnostic Studies: None-Cath planned for 12/22/2013   Consults: None  Hospital Course:   Mrs.Williamson is a 48 y/o obese female with no prior cardiac history but multiple CVRF to include diabetes, hypertension, hyperlipidemia, and FH who presented to ER with complaints of chest tightness across shoulder blades with radiation to the jaw and neck. This occurred at rest. Cardiac enzymes were cycled and heparin was started with plan to schedule cardiac cath on Monday 12/22/2013.    Cardiac enzymes were found to be normal X 3. EKG demonstrated NSR with prolonged QT interval of 448 ms Potassium was 3.7. Mg+ not evaluated. CXR was found to be negative for CM, CHF, or pneumonia. She was kept overnight for observation and was pain free. Seen by Dr. Wynonia Lawman and found to be stable for discharge. She is pre-scheduled for cardiac cath with Dr.Varanosi on Monday 12/22/2013 at 1pm. She will be sent home with SL NTG tablets. Educated that if chest pain returns and becomes severe to return to hospital. She will need to hold metformin.   Discharge Exam: Blood pressure 113/62, pulse 71, temperature 98.5 F (36.9 C), temperature source Oral, resp. rate 18, height 5\' 2"  (1.575 m), weight 169 lb 3.4 oz (76.755 kg), SpO2 93 %.   Labs:   Lab Results  Component Value Date   WBC 9.0 12/19/2013   HGB 14.1 12/19/2013   HCT 41.6 12/19/2013   MCV 82.7 12/19/2013   PLT 286 12/19/2013     Recent Labs Lab 12/20/13 0740  NA 138  K 3.7  CL 95*  CO2 29  BUN 15  CREATININE 0.82  CALCIUM 9.5  GLUCOSE 127*   Lab Results    Component Value Date   TROPONINI <0.30 12/20/2013    Lab Results  Component Value Date   CHOL 156 08/31/2011   CHOL 238* 08/30/2006   Lab Results  Component Value Date   HDL 47 08/31/2011   HDL 51.5 08/30/2006   Lab Results  Component Value Date   LDLCALC 58 03/26/2013   LDLCALC 79 08/31/2011   Lab Results  Component Value Date   TRIG 135 03/26/2013   TRIG 150 08/31/2011   TRIG 111 08/30/2006   Lab Results  Component Value Date   CHOLHDL 4.6 CALC 08/30/2006   Lab Results  Component Value Date   LDLDIRECT 162.1 08/30/2006      Radiology: Dg Chest 2 View  12/19/2013   CLINICAL DATA:  Intermittent chest pain 2 months. Mid to upper back pain between shoulder blades 2 weeks ago. Shortness of breath.  EXAM: CHEST  2 VIEW  COMPARISON:  01/02/2010 and 08/20/2006  FINDINGS: Lungs are somewhat hypoinflated without consolidation or effusion. Cardiomediastinal silhouette and remainder of the exam is unchanged.  IMPRESSION: No active cardiopulmonary disease.   Electronically Signed   By: Marin Olp M.D.   On: 12/19/2013 11:33    EKG:NSR with prolonged QTc.   FOLLOW UP PLANS AND APPOINTMENTS    Medication List    ASK your doctor about these medications        aspirin 81 MG chewable tablet  Chew 81 mg by mouth daily as needed for  mild pain.     Empagliflozin-Linagliptin 25-5 MG Tabs  Commonly known as:  GLYXAMBI  Take 1 tablet by mouth daily.     FLUoxetine 20 MG capsule  Commonly known as:  PROZAC  Take 1 capsule (20 mg total) by mouth daily.     lisdexamfetamine 20 MG capsule  Commonly known as:  VYVANSE  Take 1 capsule (20 mg total) by mouth daily.     metFORMIN 500 MG 24 hr tablet  Commonly known as:  GLUCOPHAGE-XR  Take 500 mg by mouth daily.     metFORMIN 500 MG 24 hr tablet  Commonly known as:  GLUCOPHAGE-XR  Take 2 tablets (1,000 mg total) by mouth at bedtime.     olmesartan-hydrochlorothiazide 40-25 MG per tablet  Commonly known as:  BENICAR HCT   Take 1 tablet by mouth daily.     rosuvastatin 40 MG tablet  Commonly known as:  CRESTOR  Take 1/2 - 1 tab daily       Follow-up Information    Follow up with Tina On 12/22/2013.   Why:  11 am. Nothing to eat or drink after midnight prior to cath procedure. DO NOT take metformin that am.    Contact information:   [address] [id number] [place] [phone]      Time spent with patient to include physician time: 35 minutes  Signed: Phill Myron. Omesha Bowerman NP [facility]  12/20/2013, 9:42 AM Co-Sign MD

## 2013-12-22 ENCOUNTER — Ambulatory Visit (HOSPITAL_COMMUNITY)
Admission: RE | Admit: 2013-12-22 | Discharge: 2013-12-22 | Disposition: A | Discharge status: Home / Self Care | Payer: 59 | Source: Ambulatory Visit | Attending: Interventional Cardiology | Admitting: Interventional Cardiology

## 2013-12-22 ENCOUNTER — Encounter (HOSPITAL_COMMUNITY)
Admission: RE | Disposition: A | Discharge status: Home / Self Care | Payer: Self-pay | Source: Ambulatory Visit | Attending: Interventional Cardiology

## 2013-12-22 DIAGNOSIS — R079 Chest pain, unspecified: Secondary | ICD-10-CM | POA: Insufficient documentation

## 2013-12-22 DIAGNOSIS — Z8249 Family history of ischemic heart disease and other diseases of the circulatory system: Secondary | ICD-10-CM | POA: Insufficient documentation

## 2013-12-22 DIAGNOSIS — I200 Unstable angina: Secondary | ICD-10-CM | POA: Diagnosis not present

## 2013-12-22 DIAGNOSIS — I2510 Atherosclerotic heart disease of native coronary artery without angina pectoris: Secondary | ICD-10-CM

## 2013-12-22 HISTORY — PX: LEFT HEART CATHETERIZATION WITH CORONARY ANGIOGRAM: SHX5451

## 2013-12-22 LAB — PROTIME-INR
INR: 1.01 (ref 0.00–1.49)
Prothrombin Time: 13.4 seconds (ref 11.6–15.2)

## 2013-12-22 LAB — CK TOTAL AND CKMB (NOT AT ARMC)
CK, MB: <1 ng/mL (ref 0.3–4.0)
Total CK: 54 U/L (ref 7–177)

## 2013-12-22 LAB — GLUCOSE, CAPILLARY: Glucose-Capillary: 88 mg/dL (ref 70–99)

## 2013-12-22 SURGERY — LEFT HEART CATHETERIZATION WITH CORONARY ANGIOGRAM
Anesthesia: LOCAL

## 2013-12-22 MED ORDER — HEPARIN (PORCINE) IN NACL 2-0.9 UNIT/ML-% IJ SOLN
INTRAMUSCULAR | Status: AC
  Filled 2013-12-22: qty 1000

## 2013-12-22 MED ORDER — ACETAMINOPHEN 325 MG PO TABS: 650.0000 mg | ORAL_TABLET | ORAL | Status: DC | PRN

## 2013-12-22 MED ORDER — OXYCODONE-ACETAMINOPHEN 5-325 MG PO TABS: 1.0000 | ORAL_TABLET | ORAL | Status: DC | PRN

## 2013-12-22 MED ORDER — SODIUM CHLORIDE 0.9 % IV SOLN: 1.0000 mL/kg/h | INTRAVENOUS | Status: DC

## 2013-12-22 MED ORDER — MIDAZOLAM HCL 2 MG/2ML IJ SOLN
INTRAMUSCULAR | Status: AC
  Filled 2013-12-22: qty 2

## 2013-12-22 MED ORDER — SODIUM CHLORIDE 0.9 % IJ SOLN: 3.0000 mL | Freq: Two times a day (BID) | INTRAMUSCULAR | Status: DC

## 2013-12-22 MED ORDER — SODIUM CHLORIDE 0.9 % IJ SOLN
3.0000 mL | INTRAMUSCULAR | Status: DC | PRN
Start: 2013-12-22 — End: 2013-12-22

## 2013-12-22 MED ORDER — ONDANSETRON HCL 4 MG/2ML IJ SOLN: 4.0000 mg | Freq: Four times a day (QID) | INTRAMUSCULAR | Status: DC | PRN

## 2013-12-22 MED ORDER — ASPIRIN 81 MG PO CHEW
81.0000 mg | CHEWABLE_TABLET | ORAL | Status: AC
  Administered 2013-12-22: 81 mg via ORAL

## 2013-12-22 MED ORDER — LIDOCAINE HCL (PF) 1 % IJ SOLN
INTRAMUSCULAR | Status: AC
  Filled 2013-12-22: qty 30

## 2013-12-22 MED ORDER — ASPIRIN 81 MG PO CHEW
CHEWABLE_TABLET | ORAL | Status: AC
  Administered 2013-12-22: 81 mg via ORAL
  Filled 2013-12-22: qty 1

## 2013-12-22 MED ORDER — VERAPAMIL HCL 2.5 MG/ML IV SOLN
INTRAVENOUS | Status: AC
  Filled 2013-12-22: qty 2

## 2013-12-22 MED ORDER — SODIUM CHLORIDE 0.9 % IV SOLN
INTRAVENOUS | Status: DC
  Administered 2013-12-22: 12:00:00 via INTRAVENOUS

## 2013-12-22 MED ORDER — FENTANYL CITRATE 0.05 MG/ML IJ SOLN
INTRAMUSCULAR | Status: AC
  Filled 2013-12-22: qty 2

## 2013-12-22 MED ORDER — SODIUM CHLORIDE 0.9 % IV SOLN
250.0000 mL | INTRAVENOUS | Status: DC | PRN
Start: 2013-12-22 — End: 2013-12-22

## 2013-12-22 MED ORDER — NITROGLYCERIN 1 MG/10 ML FOR IR/CATH LAB
INTRA_ARTERIAL | Status: AC
  Filled 2013-12-22: qty 10

## 2013-12-22 MED ORDER — HEPARIN SODIUM (PORCINE) 1000 UNIT/ML IJ SOLN
INTRAMUSCULAR | Status: AC
  Filled 2013-12-22: qty 1

## 2013-12-22 NOTE — Interval H&P Note (Signed)
History and Physical Interval Note:  12/22/2013 1:55 PM  Lindsay Rios  has presented today for surgery, with the diagnosis of unstable angina/cp  The various methods of treatment have been discussed with the patient and family. After consideration of risks, benefits and other options for treatment, the patient has consented to  Procedure(s): LEFT HEART CATHETERIZATION WITH CORONARY ANGIOGRAM (N/A) as a surgical intervention .  The patient's history has been reviewed, patient examined, no change in status, stable for surgery.  I have reviewed the patient's chart and labs.  Questions were answered to the patient's satisfaction.     Sherren Mocha

## 2013-12-22 NOTE — Progress Notes (Signed)
TR BAND REMOVAL  LOCATION:    right radial  DEFLATED PER PROTOCOL:    Yes.    TIME BAND OFF / DRESSING APPLIED:    1545   SITE UPON ARRIVAL:    Level 0  SITE AFTER BAND REMOVAL:    Level 0  CIRCULATION SENSATION AND MOVEMENT:    Within Normal Limits   Yes.    COMMENTS:   TRB removed/ tegaderm dsg applied

## 2013-12-22 NOTE — CV Procedure (Signed)
    Cardiac Catheterization Procedure Note  Name: Lindsay Rios MRN: [id number] DOB: 04-Oct-1965  Procedure: Left Heart Cath, Selective Coronary Angiography, LV angiography  Indication: Chest pain, multiple CV risk factors with very strong family hx of premature CAD   Procedural Details: The right wrist was prepped, draped, and anesthetized with 1% lidocaine. Using the modified Seldinger technique, a 5/6 French Slender sheath was introduced into the right radial artery. 3 mg of verapamil was administered through the sheath, weight-based unfractionated heparin was administered intravenously. Standard Judkins catheters were used for selective coronary angiography and left ventriculography. Catheter exchanges were performed over an exchange length guidewire. There were no immediate procedural complications. A TR band was used for radial hemostasis at the completion of the procedure.  The patient was transferred to the post catheterization recovery area for further monitoring.  Procedural Findings: Hemodynamics: AO 110/64 LV 109/11  Coronary angiography: Coronary dominance: right  Left mainstem: Widely patent left main without obstructive  Left anterior descending (LAD): Patent to the LV apex. The proximal vessel and first diagonal are widely patent. The mid-vessel has very mild narrowing that I suspect is related to an intramyocardial segment but could also be atherosclerotic with approximately 40% associated stenosis.  Left circumflex (LCx): Widely patent, smooth vessel without obstruction. The first OM is widely patent.  Right coronary artery (RCA): Minor proximal irregularity without stenosis. The vessel is large, dominant, without significant stenosis. The PDA and PLA branches are patent.   Left ventriculography: Left ventricular systolic function is normal, LVEF is estimated at 55-65%, there is no significant mitral regurgitation   Estimated Blood Loss: minimal  Final  Conclusions:   1. Patent coronary arteries with nonobstructive stenosis of the mid-LAD, minimal plaque in the proximal RCA, and otherwise no disease identified. 2. Normal LV systolic function  Recommendations: Suspect noncardiac chest pain. Risk reduction measures.  Sherren Mocha MD, [facility] 12/22/2013, 1:55 PM

## 2013-12-22 NOTE — Research (Signed)
Bioflow Study Informed Consent   Subject Name: Lindsay Rios  Subject met inclusion and exclusion criteria.  The informed consent form, study requirements and expectations were reviewed with the subject and questions and concerns were addressed prior to the signing of the consent form.  The subject verbalized understanding of the trail requirements.  The subject agreed to participate in the Bioflow trial and signed the informed consent.  The informed consent was obtained prior to performance of any protocol-specific procedures for the subject.  A copy of the signed informed consent was given to the subject and a copy was placed in the subject's medical record.  Sandie Ano 12/22/2013, 11:24

## 2013-12-22 NOTE — H&P (View-Only) (Signed)
Pt will be ruled out for MI over night. If neg trops and pain free pt will be discharged home and return on Monday for outpt cath . If rules in, pt will stay in the hospital  for cath on Monday. Pt is schedule for a Left Heart cath on Monday 12/22/13 @1300  with Dr Irish Lack. If pt is discharged over the weekend, she will need to return to the [facility] on Monday at 11am. Dr Ron Parker has discussed the cath including the risk and benefits with the pt. Pt agrees to proceed with cath on Monday either as an inpt or outpt.  Daryel November, MD

## 2013-12-22 NOTE — Discharge Instructions (Signed)
Radial Site Care °Refer to this sheet in the next few weeks. These instructions provide you with information on caring for yourself after your procedure. Your caregiver may also give you more specific instructions. Your treatment has been planned according to current medical practices, but problems sometimes occur. Call your caregiver if you have any problems or questions after your procedure. °HOME CARE INSTRUCTIONS °· You may shower the day after the procedure. Remove the bandage (dressing) and gently wash the site with plain soap and water. Gently pat the site dry. °· Do not apply powder or lotion to the site. °· Do not submerge the affected site in water for 3 to 5 days. °· Inspect the site at least twice daily. °· Do not flex or bend the affected arm for 24 hours. °· No lifting over 5 pounds (2.3 kg) for 5 days after your procedure. °· Do not drive home if you are discharged the same day of the procedure. Have someone else drive you. °· You may drive 24 hours after the procedure unless otherwise instructed by your caregiver. °· Do not operate machinery or power tools for 24 hours. °· A responsible adult should be with you for the first 24 hours after you arrive home. °What to expect: °· Any bruising will usually fade within 1 to 2 weeks. °· Blood that collects in the tissue (hematoma) may be painful to the touch. It should usually decrease in size and tenderness within 1 to 2 weeks. °SEEK IMMEDIATE MEDICAL CARE IF: °· You have unusual pain at the radial site. °· You have redness, warmth, swelling, or pain at the radial site. °· You have drainage (other than a small amount of blood on the dressing). °· You have chills. °· You have a fever or persistent symptoms for more than 72 hours. °· You have a fever and your symptoms suddenly get worse. °· Your arm becomes pale, cool, tingly, or numb. °· You have heavy bleeding from the site. Hold pressure on the site. °Document Released: 02/18/2010 Document Revised:  04/10/2011 Document Reviewed: 02/18/2010 °ExitCare® Patient Information ©2015 [employer]. This information is not intended to replace advice given to you by your health care provider. Make sure you discuss any questions you have with your health care provider. ° °

## 2014-01-08 ENCOUNTER — Encounter (HOSPITAL_COMMUNITY): Payer: Self-pay | Admitting: Interventional Cardiology

## 2014-02-06 ENCOUNTER — Encounter: Payer: Self-pay | Admitting: Cardiology

## 2014-02-06 DIAGNOSIS — R9430 Abnormal result of cardiovascular function study, unspecified: Secondary | ICD-10-CM | POA: Insufficient documentation

## 2014-02-09 ENCOUNTER — Encounter: Payer: Self-pay | Admitting: Cardiology

## 2014-02-09 ENCOUNTER — Ambulatory Visit (INDEPENDENT_AMBULATORY_CARE_PROVIDER_SITE_OTHER): Payer: 59 | Admitting: Cardiology

## 2014-02-09 VITALS — BP 100/60 | HR 88 | Ht 62.0 in | Wt 170.4 lb

## 2014-02-09 DIAGNOSIS — R9430 Abnormal result of cardiovascular function study, unspecified: Secondary | ICD-10-CM

## 2014-02-09 DIAGNOSIS — R0989 Other specified symptoms and signs involving the circulatory and respiratory systems: Secondary | ICD-10-CM

## 2014-02-09 DIAGNOSIS — Z0000 Encounter for general adult medical examination without abnormal findings: Secondary | ICD-10-CM

## 2014-02-09 DIAGNOSIS — R072 Precordial pain: Secondary | ICD-10-CM

## 2014-02-09 DIAGNOSIS — F4329 Adjustment disorder with other symptoms: Secondary | ICD-10-CM

## 2014-02-09 DIAGNOSIS — E119 Type 2 diabetes mellitus without complications: Secondary | ICD-10-CM | POA: Insufficient documentation

## 2014-02-09 NOTE — Assessment & Plan Note (Signed)
Wall motion is normal and there are no valvular abnormalities.

## 2014-02-09 NOTE — Assessment & Plan Note (Signed)
The patient is doing well. We know from catheterization that she has mild coronary disease that is not causing symptoms. However she has significant risks. She is on Crestor 40 mg. She does have diabetes. No further workup is needed now. We had a careful discussion about long-term careful secondary prevention. I will arrange to have a fasting lipid profile to be sure that she makes goal on her Crestor dosing.

## 2014-02-09 NOTE — Patient Instructions (Signed)
**Note De-Identified Lenise Jr Obfuscation** Your physician recommends that you continue on your current medications as directed. Please refer to the Current Medication list given to you today.  Your physician recommends that you return for lab work in: 02/16/14 (please do not eat or drink after midnight the night before labs are drawn).  Your physician wants you to follow-up in: 6 months. You will receive a reminder letter in the mail two months in advance. If you don't receive a letter, please call our office to schedule the follow-up appointment.

## 2014-02-09 NOTE — Progress Notes (Signed)
Patient ID: Lindsay Rios, female   DOB: Aug 15, 1965, 49 y.o.   MRN: [id number]    HPI Patient is seen to follow-up hospitalization for chest pain. She was hospitalized and her enzymes were negative. She has a strong family history and diabetes and her story was concerning. Therefore she was discharged home to be brought back as an outpatient for catheterization several days later. This study was done showing some mild nonobstructive disease. She has good left ventricular function. She is now here for follow-up. She notes that she has the discomfort in her back when she is anxious about school. She has not had any recurring symptoms other than one day on return to school when she was again anxious.  Allergies  Allergen Reactions  . Sulfonamide Derivatives     REACTION: nausea    Current Outpatient Prescriptions  Medication Sig Dispense Refill  . aspirin 81 MG chewable tablet Chew 81 mg by mouth daily.     . Empagliflozin-Linagliptin (GLYXAMBI) 25-5 MG TABS Take 1 tablet by mouth daily. (Patient taking differently: Take 1 tablet by mouth daily. BY MOUTH DAILY) 30 tablet 5  . FLUoxetine (PROZAC) 20 MG capsule Take 1 capsule (20 mg total) by mouth daily. 90 capsule 1  . lisdexamfetamine (VYVANSE) 20 MG capsule Take 1 capsule (20 mg total) by mouth daily. 30 capsule 0  . metFORMIN (GLUCOPHAGE-XR) 500 MG 24 hr tablet Take 500 mg by mouth 2 (two) times daily. BY MOUTH    . nitroGLYCERIN (NITROSTAT) 0.4 MG SL tablet Place 1 tablet (0.4 mg total) under the tongue every 5 (five) minutes as needed for chest pain. 30 tablet 12  . rosuvastatin (CRESTOR) 40 MG tablet Take 1/2 - 1 tab daily (Patient taking differently: Take 1/2 - 1 tab daily BY MOUTH) 30 tablet 5  . metFORMIN (GLUCOPHAGE-XR) 500 MG 24 hr tablet Take 2 tablets (1,000 mg total) by mouth at bedtime. 180 tablet 0  . olmesartan-hydrochlorothiazide (BENICAR HCT) 40-25 MG per tablet Take 1 tablet by mouth daily. 30 tablet 11   No current  facility-administered medications for this visit.    History   Social History  . Marital Status: Divorced    Spouse Name: [name] (Separated)     Number of Children: 2  . Years of Education: 13+    Occupational History  . Homemaker    . Student      [employer]    Social History Main Topics  . Smoking status: Never Smoker   . Smokeless tobacco: Never Used  . Alcohol Use: Yes     Comment: 12/19/2013 "might have a drink a couple times/year"  . Drug Use: No  . Sexual Activity:    Partners: Male   Other Topics Concern  . Not on file   Social History Narrative   Marital Status: Separated [name]) Significant Other [name])    Children:  Son (1) Daughter (1)    Pets: Dogs (2), Cat (1)    Living Situation: Lives with children.   Occupation: Full- Time Student    Education: [employer] ([place])    Tobacco Use: She has never smoked.     Alcohol Use:  Rarely   Drug Use:  None   Diet:  Regular   Exercise:  None   Hobbies: Shopping          Family History  Problem Relation Age of Onset  . Hyperlipidemia Mother   . Hyperlipidemia Father   . Hypertension Father   . Diabetes Father   .  Heart disease Father   . Cancer Maternal Grandmother     Colon Cancer  . Alzheimer's disease Maternal Grandmother   . Heart disease Brother 40    Myocardial Infarction    Past Medical History  Diagnosis Date  . Hypertension   . Depression   . Hyperlipidemia   . ADD (attention deficit disorder)   . Osteopenia   . 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 Pneumonia 2012  . Gestational diabetes 1999; 2002  . Type II diabetes mellitus     "borderline; I'm on Metformin"  . Ejection fraction     Past Surgical History  Procedure Laterality Date  . Shoulder arthroscopy w/ rotator cuff repair Right 1990's  . Vaginal hysterectomy  2013    HPV/Cervical Dysplasia; partial  . Left heart catheterization with coronary angiogram N/A 12/22/2013    Procedure: LEFT HEART CATHETERIZATION WITH CORONARY  ANGIOGRAM;  Surgeon: Jettie Booze, MD;  [facility] C[facility] LAB;  Service: Cardiovascular;  Laterality: N/A;    Patient Active Problem List   Diagnosis Date Noted  . Diabetes mellitus 02/09/2014  . Ejection fraction   . Chest pain at rest 12/22/2013  . Chest pain 12/19/2013  . IFG (impaired fasting glucose) 06/14/2013  . Elevated transaminase level 06/14/2013  . Stress and adjustment reaction 06/14/2013  . Concentration deficit 06/02/2012  . Routine general medical examination at a health care facility 05/25/2012  . Need for prophylactic vaccination with combined diphtheria-tetanus-pertussis (DTP) vaccine 05/25/2012  . NONSPECIFIC ABNORMAL FINDING IN STOOL CONTENTS 05/06/2007  . DISORDER, DEPRESSIVE NEC 09/25/2006  . ALLERGIC RHINITIS 09/25/2006    ROS  Patient denies fever, chills, headache, sweats, rash, change in vision, change in hearing, cough, nausea or vomiting, urinary symptoms. All other systems are reviewed and are negative.  PHYSICAL EXAM Patient is oriented to person time and place. Affect is normal. Head is atraumatic. Sclera and conjunctiva are normal. She is overweight. Lungs are clear. Respiratory effort is nonlabored. Cardiac exam reveals S1 and S2. Abdomen is soft. There is no peripheral edema. There are no musculoskeletal deformities. There are no skin rashes.  Filed Vitals:   02/09/14 1434  BP: 100/60  Pulse: 88  Height: 5\' 2"  (1.575 m)  Weight: 170 lb 6.4 oz (77.293 kg)  SpO2: 96%    ASSESSMENT & PLAN

## 2014-02-09 NOTE — Assessment & Plan Note (Signed)
The patient is on metformin. At this time she seems motivated to try to lose some weight. This may help with her overall management.

## 2014-02-09 NOTE — Assessment & Plan Note (Signed)
The patient is aware that she has her shoulder discomfort when she is stressed. At this point we feel that the shoulder discomfort is not from epicardial coronary disease.

## 2014-02-16 ENCOUNTER — Other Ambulatory Visit (INDEPENDENT_AMBULATORY_CARE_PROVIDER_SITE_OTHER): Payer: 59 | Admitting: *Deleted

## 2014-02-16 DIAGNOSIS — Z0000 Encounter for general adult medical examination without abnormal findings: Secondary | ICD-10-CM

## 2014-02-16 LAB — LIPID PANEL
Cholesterol: 153 mg/dL (ref 0–200)
HDL: 48 mg/dL (ref >39.00)
LDL Cholesterol: 75 mg/dL (ref 0–99)
NonHDL: 105
Total CHOL/HDL Ratio: 3
Triglycerides: 149 mg/dL (ref 0.0–149.0)
VLDL: 29.8 mg/dL (ref 0.0–40.0)

## 2014-02-22 ENCOUNTER — Encounter: Payer: Self-pay | Admitting: Cardiology

## 2014-02-22 DIAGNOSIS — E785 Hyperlipidemia, unspecified: Secondary | ICD-10-CM | POA: Insufficient documentation

## 2014-07-02 LAB — LIPID PANEL
Cholesterol: 194 mg/dL (ref 0–200)
HDL: 48 mg/dL (ref 35–70)
LDL Cholesterol: 107 mg/dL
Triglycerides: 193 mg/dL — AB (ref 40–160)

## 2014-07-02 LAB — HEMOGLOBIN A1C: Hgb A1c MFr Bld: 6.5 % — AB (ref 4.0–6.0)

## 2014-07-30 ENCOUNTER — Institutional Professional Consult (permissible substitution): Payer: 59 | Admitting: Pulmonary Disease

## 2014-08-11 ENCOUNTER — Ambulatory Visit (INDEPENDENT_AMBULATORY_CARE_PROVIDER_SITE_OTHER): Payer: 59

## 2014-08-11 ENCOUNTER — Ambulatory Visit (INDEPENDENT_AMBULATORY_CARE_PROVIDER_SITE_OTHER): Payer: 59 | Admitting: Podiatry

## 2014-08-11 ENCOUNTER — Encounter: Payer: Self-pay | Admitting: Podiatry

## 2014-08-11 VITALS — BP 122/78 | HR 89 | Resp 12

## 2014-08-11 DIAGNOSIS — M722 Plantar fascial fibromatosis: Secondary | ICD-10-CM | POA: Diagnosis not present

## 2014-08-11 DIAGNOSIS — M779 Enthesopathy, unspecified: Secondary | ICD-10-CM | POA: Diagnosis not present

## 2014-08-11 MED ORDER — DICLOFENAC SODIUM 75 MG PO TBEC
75.0000 mg | DELAYED_RELEASE_TABLET | Freq: Two times a day (BID) | ORAL | Status: DC
Start: 2014-08-11 — End: 2014-12-10

## 2014-08-11 MED ORDER — TRIAMCINOLONE ACETONIDE 10 MG/ML IJ SUSP
10.0000 mg | Freq: Once | INTRAMUSCULAR | Status: AC
  Administered 2014-08-11: 10 mg

## 2014-08-11 MED ORDER — DICLOFENAC SODIUM 75 MG PO TBEC: 75.0000 mg | DELAYED_RELEASE_TABLET | Freq: Two times a day (BID) | ORAL | Status: DC

## 2014-08-11 NOTE — Patient Instructions (Signed)
Plantar Fasciitis  Plantar fasciitis is a common condition that causes foot pain. It is soreness (inflammation) of the band of tough fibrous tissue on the bottom of the foot that runs from the heel bone (calcaneus) to the ball of the foot. The cause of this soreness may be from excessive standing, poor fitting shoes, running on hard surfaces, being overweight, having an abnormal walk, or overuse (this is common in runners) of the painful foot or feet. It is also common in aerobic exercise dancers and ballet dancers.  SYMPTOMS   Most people with plantar fasciitis complain of:   Severe pain in the morning on the bottom of their foot especially when taking the first steps out of bed. This pain recedes after a few minutes of walking.   Severe pain is experienced also during walking following a long period of inactivity.   Pain is worse when walking barefoot or up stairs  DIAGNOSIS    Your caregiver will diagnose this condition by examining and feeling your foot.   Special tests such as X-rays of your foot, are usually not needed.  PREVENTION    Consult a sports medicine professional before beginning a new exercise program.   Walking programs offer a good workout. With walking there is a lower chance of overuse injuries common to runners. There is less impact and less jarring of the joints.   Begin all new exercise programs slowly. If problems or pain develop, decrease the amount of time or distance until you are at a comfortable level.   Wear good shoes and replace them regularly.   Stretch your foot and the heel cords at the back of the ankle (Achilles tendon) both before and after exercise.   Run or exercise on even surfaces that are not hard. For example, asphalt is better than pavement.   Do not run barefoot on hard surfaces.   If using a treadmill, vary the incline.   Do not continue to workout if you have foot or joint problems. Seek professional help if they do not improve.  HOME CARE INSTRUCTIONS     Avoid activities that cause you pain until you recover.   Use ice or cold packs on the problem or painful areas after working out.   Only take over-the-counter or prescription medicines for pain, discomfort, or fever as directed by your caregiver.   Soft shoe inserts or athletic shoes with air or gel sole cushions may be helpful.   If problems continue or become more severe, consult a sports medicine caregiver or your own health care provider. Cortisone is a potent anti-inflammatory medication that may be injected into the painful area. You can discuss this treatment with your caregiver.  MAKE SURE YOU:    Understand these instructions.   Will watch your condition.   Will get help right away if you are not doing well or get worse.  Document Released: 10/11/2000 Document Revised: 04/10/2011 Document Reviewed: 12/11/2007  ExitCare Patient Information 2015 [employer]. This information is not intended to replace advice given to you by your health care provider. Make sure you discuss any questions you have with your health care provider.

## 2014-08-11 NOTE — Progress Notes (Signed)
   Subjective:    Patient ID: Lindsay Rios, female    DOB: 1965-11-08, 49 y.o.   MRN: [id number]  HPI  PT STATED B/L HEEL BEEN PAINFUL AND TENDER FOR 6 MONTHS. FEET ARE GETTING WORSE ESPECIALLY FIRST STEP IN THE MORNING. TRIED NO TREATMENT.  Review of Systems  Musculoskeletal: Positive for joint swelling and gait problem.  Allergic/Immunologic: Positive for environmental allergies.  All other systems reviewed and are negative.      Objective:   Physical Exam        Assessment & Plan:

## 2014-08-12 NOTE — Progress Notes (Signed)
Subjective:     Patient ID: Lindsay Rios, female   DOB: 04/26/1965, 49 y.o.   MRN: [id number]  HPI patient presents stating I been having a lot of pain in my around 6 months and the outside of my left foot has been hurting me for about that period of time. 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      Objective:   Physical Exam  Constitutional: She is oriented to person, place, and time.  Cardiovascular: Intact distal pulses.   Musculoskeletal: Normal range of motion.  Neurological: She is oriented to person, place, and time.  Skin: Skin is warm.  Nursing note and vitals reviewed.  neurovascular status intact with muscle strength adequate and range of motion subtalar midtarsal joint within normal limits. Patient's noted to have moderate depression of the arch and is found to have significant discomfort in the plantar fascia at the insertion tendon calcaneus right with inflammation the lateral side of the foot left around the peroneal insertion with fluid buildup noted     Assessment:     Inflammatory plantar fasciitis right along with tendinitis peroneal left    Plan:     H&P and x-rays performed. Injected the plantar fascia right 3 mg Kenalog 5 mg Xylocaine in the peroneal insertion left 3 mg Kenalog 5 mg Xylocaine and discussed long-term orthotic therapy which is necessary. Patient wants to orthotics made but we will wait to the next visit and make that decision and actual brace also dispensed right along with prescription for diclofenac 75 mg twice a day

## 2014-08-27 ENCOUNTER — Emergency Department (HOSPITAL_COMMUNITY): Payer: 59

## 2014-08-27 ENCOUNTER — Emergency Department (HOSPITAL_COMMUNITY)
Admission: EM | Admit: 2014-08-27 | Discharge: 2014-08-27 | Disposition: A | Discharge status: Home / Self Care | Payer: 59 | Attending: Emergency Medicine | Admitting: Emergency Medicine

## 2014-08-27 ENCOUNTER — Encounter (HOSPITAL_COMMUNITY): Payer: Self-pay | Admitting: Emergency Medicine

## 2014-08-27 DIAGNOSIS — E785 Hyperlipidemia, unspecified: Secondary | ICD-10-CM | POA: Insufficient documentation

## 2014-08-27 DIAGNOSIS — R42 Dizziness and giddiness: Secondary | ICD-10-CM | POA: Diagnosis not present

## 2014-08-27 DIAGNOSIS — Z79899 Other long term (current) drug therapy: Secondary | ICD-10-CM | POA: Diagnosis not present

## 2014-08-27 DIAGNOSIS — E119 Type 2 diabetes mellitus without complications: Secondary | ICD-10-CM | POA: Insufficient documentation

## 2014-08-27 DIAGNOSIS — Z8701 Personal history of pneumonia (recurrent): Secondary | ICD-10-CM | POA: Diagnosis not present

## 2014-08-27 DIAGNOSIS — Z7982 Long term (current) use of aspirin: Secondary | ICD-10-CM | POA: Insufficient documentation

## 2014-08-27 DIAGNOSIS — R202 Paresthesia of skin: Secondary | ICD-10-CM | POA: Diagnosis not present

## 2014-08-27 DIAGNOSIS — Z8739 Personal history of other diseases of the musculoskeletal system and connective tissue: Secondary | ICD-10-CM | POA: Insufficient documentation

## 2014-08-27 DIAGNOSIS — Z9889 Other specified postprocedural states: Secondary | ICD-10-CM | POA: Diagnosis not present

## 2014-08-27 DIAGNOSIS — I10 Essential (primary) hypertension: Secondary | ICD-10-CM | POA: Insufficient documentation

## 2014-08-27 DIAGNOSIS — Z8632 Personal history of gestational diabetes: Secondary | ICD-10-CM | POA: Diagnosis not present

## 2014-08-27 DIAGNOSIS — Z791 Long term (current) use of non-steroidal anti-inflammatories (NSAID): Secondary | ICD-10-CM | POA: Diagnosis not present

## 2014-08-27 DIAGNOSIS — Z8619 Personal history of other infectious and parasitic diseases: Secondary | ICD-10-CM | POA: Insufficient documentation

## 2014-08-27 DIAGNOSIS — R200 Anesthesia of skin: Secondary | ICD-10-CM | POA: Diagnosis present

## 2014-08-27 LAB — CBC WITH DIFFERENTIAL/PLATELET
Basophils Absolute: 0.1 10*3/uL (ref 0.0–0.1)
Basophils Relative: 1 % (ref 0–1)
Eosinophils Absolute: 0.2 10*3/uL (ref 0.0–0.7)
Eosinophils Relative: 2 % (ref 0–5)
HCT: 42.8 % (ref 36.0–46.0)
Hemoglobin: 14.6 g/dL (ref 12.0–15.0)
Lymphocytes Relative: 24 % (ref 12–46)
Lymphs Abs: 2.3 10*3/uL (ref 0.7–4.0)
MCH: 29 pg (ref 26.0–34.0)
MCHC: 34.1 g/dL (ref 30.0–36.0)
MCV: 84.9 fL (ref 78.0–100.0)
Monocytes Absolute: 0.6 10*3/uL (ref 0.1–1.0)
Monocytes Relative: 6 % (ref 3–12)
Neutro Abs: 6.4 10*3/uL (ref 1.7–7.7)
Neutrophils Relative %: 67 % (ref 43–77)
Platelets: 228 10*3/uL (ref 150–400)
RBC: 5.04 MIL/uL (ref 3.87–5.11)
RDW: 12.7 % (ref 11.5–15.5)
WBC: 9.4 10*3/uL (ref 4.0–10.5)

## 2014-08-27 LAB — COMPREHENSIVE METABOLIC PANEL
ALT: 51 U/L (ref 14–54)
AST: 31 U/L (ref 15–41)
Albumin: 3.7 g/dL (ref 3.5–5.0)
Alkaline Phosphatase: 57 U/L (ref 38–126)
Anion gap: 9 (ref 5–15)
BUN: 14 mg/dL (ref 6–20)
CO2: 28 mmol/L (ref 22–32)
Calcium: 9.4 mg/dL (ref 8.9–10.3)
Chloride: 102 mmol/L (ref 101–111)
Creatinine, Ser: 0.71 mg/dL (ref 0.44–1.00)
GFR calc Af Amer: >60 mL/min (ref >60)
GFR calc non Af Amer: >60 mL/min (ref >60)
Glucose, Bld: 112 mg/dL — ABNORMAL HIGH (ref 65–99)
Potassium: 3.7 mmol/L (ref 3.5–5.1)
Sodium: 139 mmol/L (ref 135–145)
Total Bilirubin: 0.6 mg/dL (ref 0.3–1.2)
Total Protein: 6.5 g/dL (ref 6.5–8.1)

## 2014-08-27 IMAGING — CT CT HEAD W/O CM
1 series · 16 of 30 positions shown, 20 images · non-contrast
Comparison: None.

CLINICAL DATA: Left hand numbness

EXAM:
CT HEAD WITHOUT CONTRAST
TECHNIQUE: Contiguous axial images were obtained from the base of the skull
through the vertex without intravenous contrast.

[Series 2: head 5.0 h30s · axial · 0.41mm/px · z∈[-55,+80]mm · 16 of 31 slices shown, 20 images]
[im 2/31  brain]
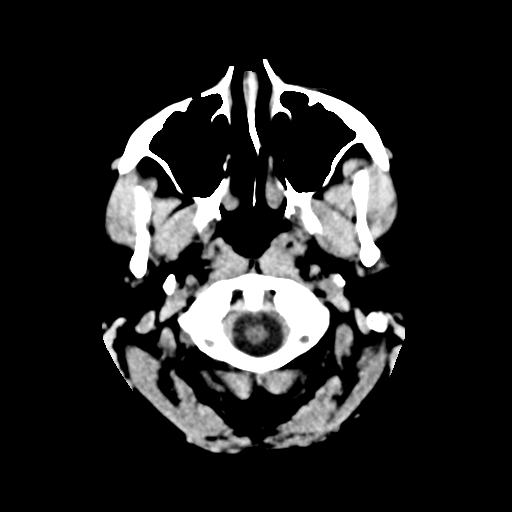
[im 2/31  bone]
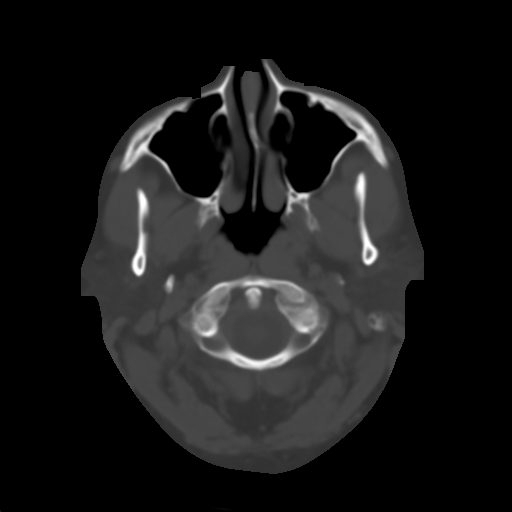
[im 4/31  brain]
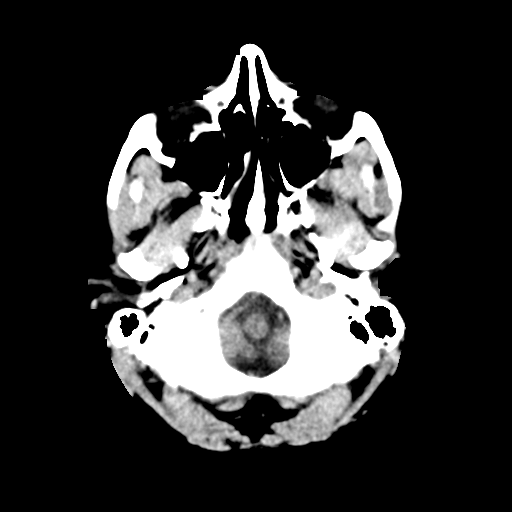
[im 6/31  brain]
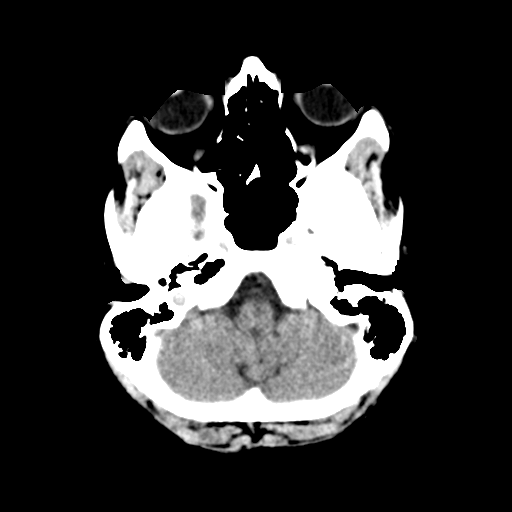
[im 8/31  brain]
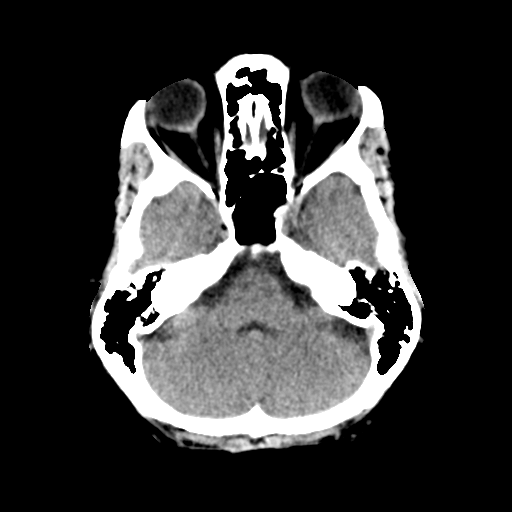
[im 9/31  brain]
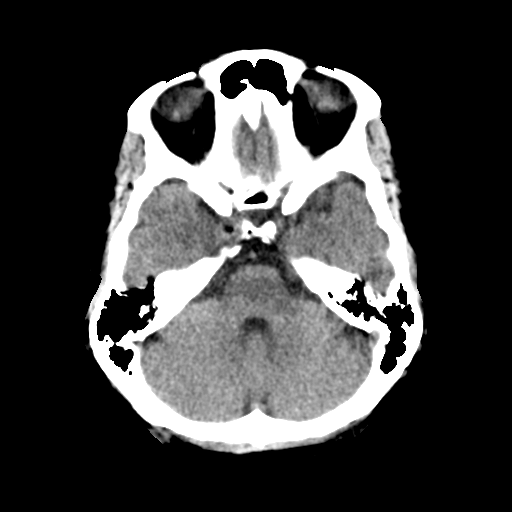
[im 9/31  bone]
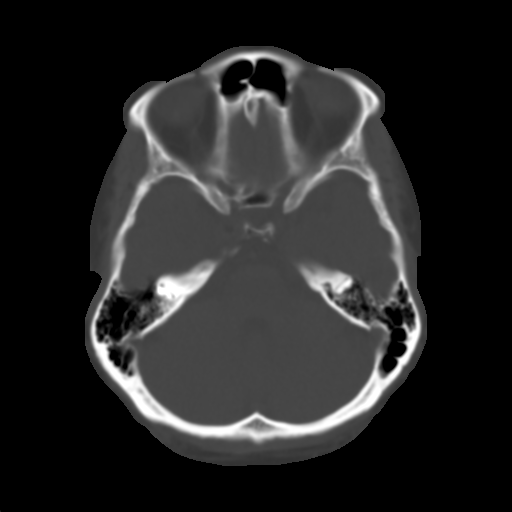
[im 11/31  brain]
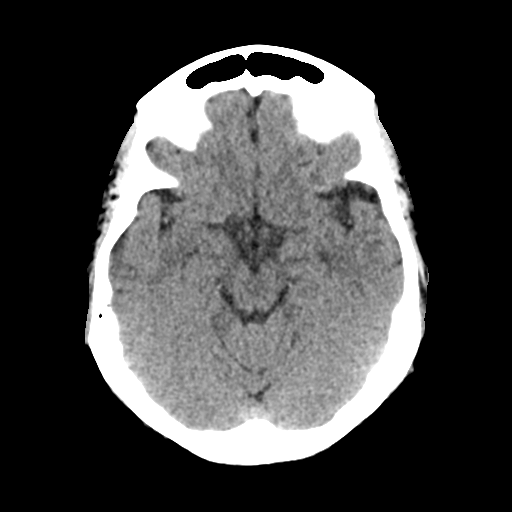
[im 13/31  brain]
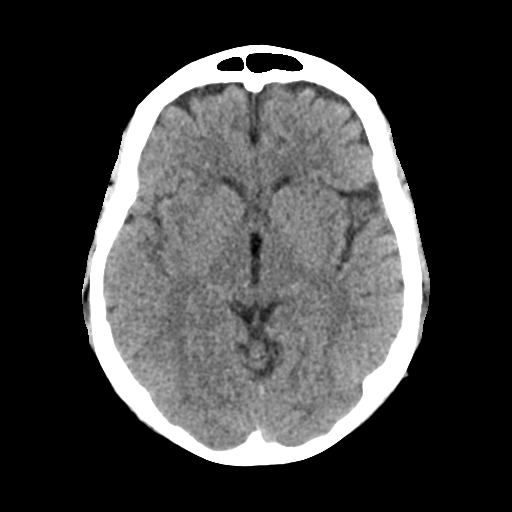
[im 15/31  brain]
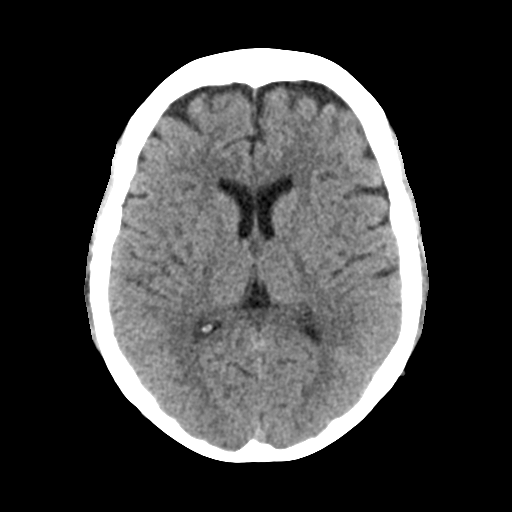
[im 16/31  brain]
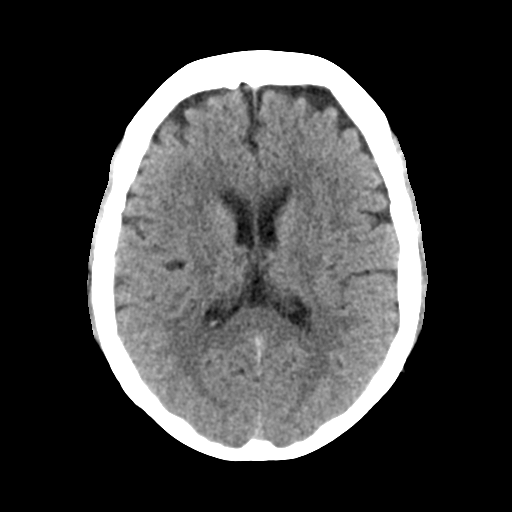
[im 16/31  bone]
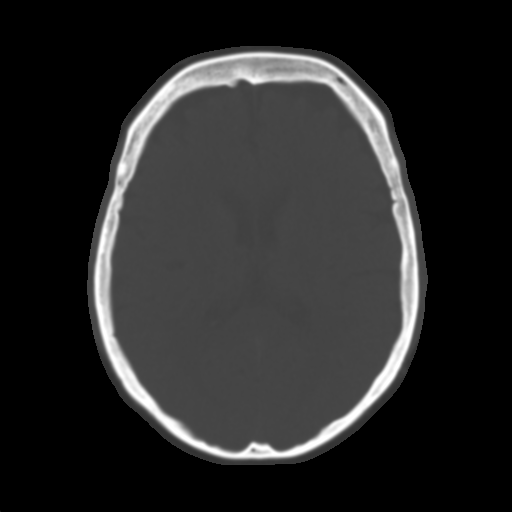
[im 18/31  brain]
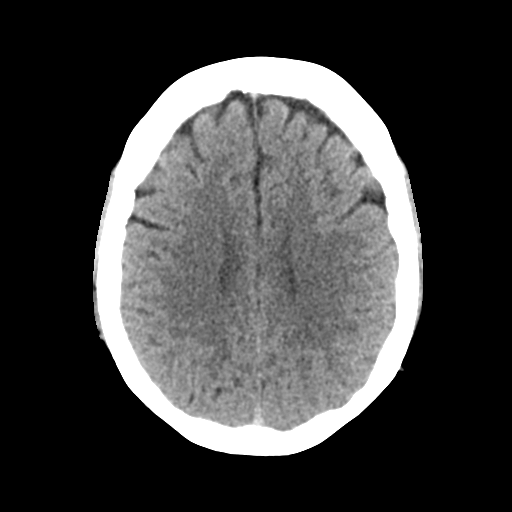
[im 20/31  brain]
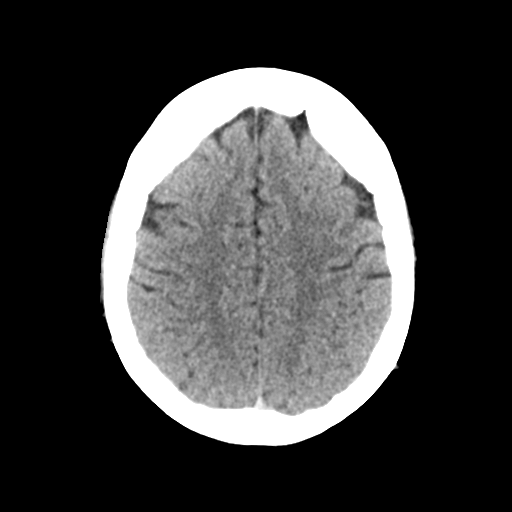
[im 22/31  brain]
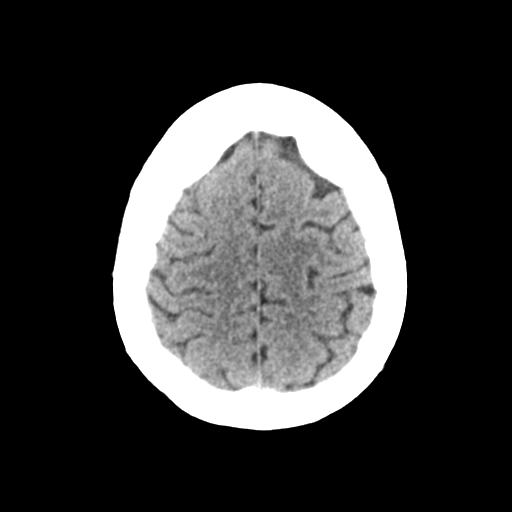
[im 23/31  brain]
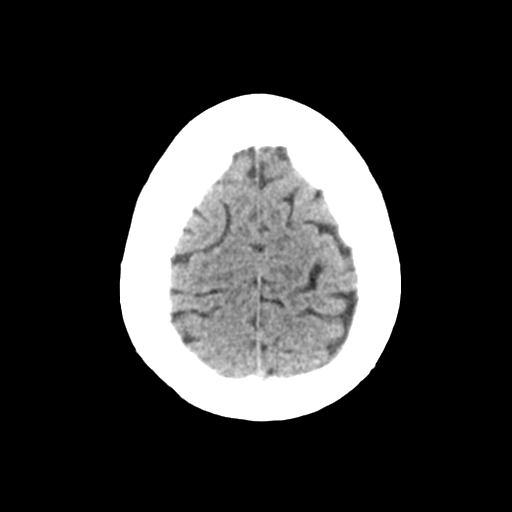
[im 23/31  bone]
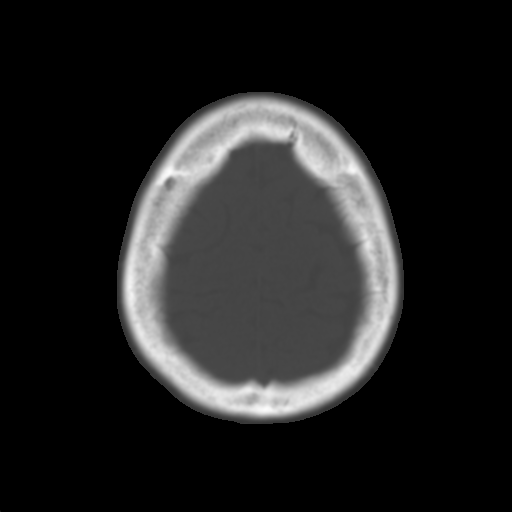
[im 25/31  brain]
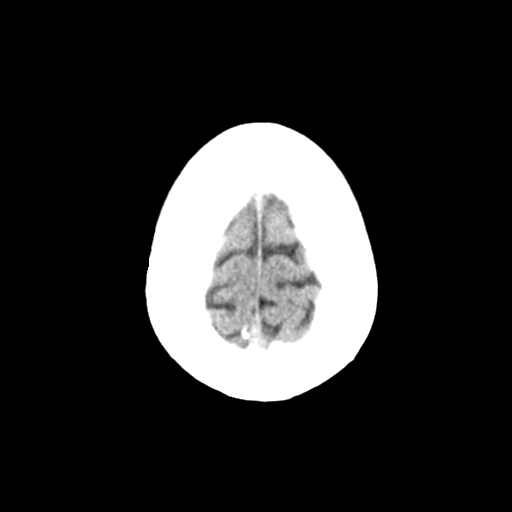
[im 27/31  brain]
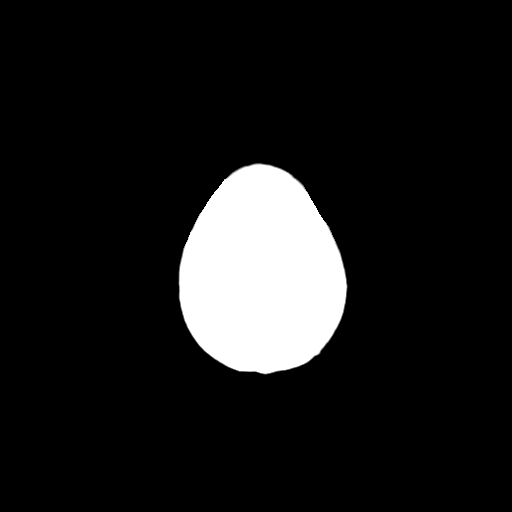
[im 29/31  brain]
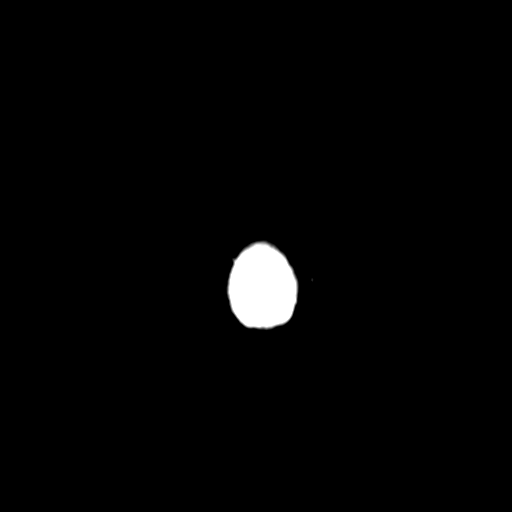

[16 of 30 positions shown; findings below may reference images not displayed]

FINDINGS: Bony calvarium is intact. No gross soft tissue abnormality is noted.
The paranasal sinuses and mastoid air cells are well aerated.

No findings to suggest acute hemorrhage, acute infarction or
space-occupying mass lesion are noted.
IMPRESSION: No acute intracranial abnormality seen.

## 2014-08-27 NOTE — ED Notes (Signed)
Pt transporting to CT in NAD 

## 2014-08-27 NOTE — ED Notes (Signed)
Pt ambulatory with steady gait on departure. A/O x4. VSS. Denies pain. Verbalizes understanding of discharge instructions.

## 2014-08-27 NOTE — Discharge Instructions (Signed)
Paresthesia Follow up with your primary care physician. Paresthesia is an abnormal burning or prickling sensation. This sensation is generally felt in the hands, arms, legs, or feet. However, it may occur in any part of the body. It is usually not painful. The feeling may be described as:  Tingling or numbness.  "Pins and needles."  Skin crawling.  Buzzing.  Limbs "falling asleep."  Itching. Most people experience temporary (transient) paresthesia at some time in their lives. CAUSES  Paresthesia may occur when you breathe too quickly (hyperventilation). It can also occur without any apparent cause. Commonly, paresthesia occurs when pressure is placed on a nerve. The feeling quickly goes away once the pressure is removed. For some people, however, paresthesia is a long-lasting (chronic) condition caused by an underlying disorder. The underlying disorder may be:  A traumatic, direct injury to nerves. Examples include a:  Broken (fractured) neck.  Fractured skull.  A disorder affecting the brain and spinal cord (central nervous system). Examples include:  Transverse myelitis.  Encephalitis.  Transient ischemic attack.  Multiple sclerosis.  Stroke.  Tumor or blood vessel problems, such as an arteriovenous malformation pressing against the brain or spinal cord.  A condition that damages the peripheral nerves (peripheral neuropathy). Peripheral nerves are not part of the brain and spinal cord. These conditions include:  Diabetes.  Peripheral vascular disease.  Nerve entrapment syndromes, such as carpal tunnel syndrome.  Shingles.  Hypothyroidism.  Vitamin B12 deficiencies.  Alcoholism.  Heavy metal poisoning (lead, arsenic).  Rheumatoid arthritis.  Systemic lupus erythematosus. DIAGNOSIS  Your caregiver will attempt to find the underlying cause of your paresthesia. Your caregiver may:  Take your medical history.  Perform a physical exam.  Order various lab  tests.  Order imaging tests. TREATMENT  Treatment for paresthesia depends on the underlying cause. HOME CARE INSTRUCTIONS  Avoid drinking alcohol.  You may consider massage or acupuncture to help relieve your symptoms.  Keep all follow-up appointments as directed by your caregiver. SEEK IMMEDIATE MEDICAL CARE IF:   You feel weak.  You have trouble walking or moving.  You have problems with speech or vision.  You feel confused.  You cannot control your bladder or bowel movements.  You feel numbness after an injury.  You faint.  Your burning or prickling feeling gets worse when walking.  You have pain, cramps, or dizziness.  You develop a rash. MAKE SURE YOU:  Understand these instructions.  Will watch your condition.  Will get help right away if you are not doing well or get worse. Document Released: 01/06/2002 Document Revised: 04/10/2011 Document Reviewed: 10/07/2010 [facility] Patient Information 2015 [place], [place]. This information is not intended to replace advice given to you by your health care provider. Make sure you discuss any questions you have with your health care provider.

## 2014-08-27 NOTE — ED Provider Notes (Signed)
CSN: [id number]     Arrival date & time 08/27/14  1020 History   First MD Initiated Contact with Patient 08/27/14 1033     Chief Complaint  Patient presents with  . Numbness     (Consider location/radiation/quality/duration/timing/severity/associated sxs/prior Treatment) The history is provided by the patient. No language interpreter was used.   Mrs. Maricela Bo is a 49 year old female with a history of hypertension, hyperlipidemia, diabetes type 2, depression who presents for left hand tingling and numbness since yesterday morning when she woke up. She states that she thought that it was due to her falling asleep on that hand but it continued today. He also states that she has been dizzy with change in position for the past 3 months. She was recently started on metformin for diabetes. No treatment prior to arrival. Nothing makes her symptoms better or worse. She denies any recent illness, allergies, tinnitus, previous inner ear problems, headache, chest pain, shortness breath, diarrhea. LMP July 12. Past Medical History  Diagnosis Date  . Hypertension   . Depression   . Hyperlipidemia   . ADD (attention deficit disorder)   . Osteopenia   . 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 Pneumonia 2012  . Gestational diabetes 1999; 2002  . Type II diabetes mellitus     "borderline; I'm on Metformin"  . Ejection fraction    Past Surgical History  Procedure Laterality Date  . Shoulder arthroscopy w/ rotator cuff repair Right 1990's  . Vaginal hysterectomy  2013    HPV/Cervical Dysplasia; partial  . Left heart catheterization with coronary angiogram N/A 12/22/2013    Procedure: LEFT HEART CATHETERIZATION WITH CORONARY ANGIOGRAM;  Surgeon: Jettie Booze, MD;  Location: [facility] CATH LAB;  Service: Cardiovascular;  Laterality: N/A;   Family History  Problem Relation Age of Onset  . Hyperlipidemia Mother   . Hyperlipidemia Father   . Hypertension Father   . Diabetes Father   . Heart  disease Father   . Cancer Maternal Grandmother     Colon Cancer  . Alzheimer's disease Maternal Grandmother   . Heart disease Brother 40    Myocardial Infarction   History  Substance Use Topics  . Smoking status: Never Smoker   . Smokeless tobacco: Never Used  . Alcohol Use: Yes     Comment: 12/19/2013 "might have a drink a couple times/year"   OB History    No data available     Review of Systems  Constitutional: Negative for fever.  Neurological: Positive for dizziness and numbness. Negative for syncope, weakness, light-headedness and headaches.  All other systems reviewed and are negative.     Allergies  Sulfa antibiotics  Home Medications   Prior to Admission medications   Medication Sig Start Date End Date Taking? Authorizing Provider  aspirin 81 MG chewable tablet Chew 81 mg by mouth daily.     Historical Provider, MD  diclofenac (VOLTAREN) 75 MG EC tablet Take 1 tablet (75 mg total) by mouth 2 (two) times daily. 08/11/14   Wallene Huh, DPM  lisdexamfetamine (VYVANSE) 20 MG capsule Take 1 capsule (20 mg total) by mouth daily. 08/11/13   Jonathon Resides, MD  metFORMIN (GLUCOPHAGE-XR) 500 MG 24 hr tablet Take 500 mg by mouth 2 (two) times daily. BY MOUTH    Historical Provider, MD  nitroGLYCERIN (NITROSTAT) 0.4 MG SL tablet Place 1 tablet (0.4 mg total) under the tongue every 5 (five) minutes as needed for chest pain. 12/20/13  Lendon Colonel, NP  rosuvastatin (CRESTOR) 40 MG tablet Take 1/2 - 1 tab daily Patient taking differently: Take 1/2 - 1 tab daily BY MOUTH 08/11/13 08/11/14  Jonathon Resides, MD   BP 118/74 mmHg  Pulse 77  Temp(Src) 99 F (37.2 C) (Oral)  Resp 16  SpO2 93% Physical Exam  Constitutional: She is oriented to person, place, and time. She appears well-developed and well-nourished.  HENT:  Head: Normocephalic and atraumatic.  Eyes: Conjunctivae are normal.  Neck: Neck supple.  Cardiovascular: Normal rate, regular rhythm and normal heart  sounds.   Pulmonary/Chest: Effort normal and breath sounds normal. No respiratory distress. She has no wheezes. She has no rales.  Abdominal: Soft.  Musculoskeletal: Normal range of motion. She exhibits no edema or tenderness.  Left arm: No snuffbox tenderness. 2+ radial pulse. Full ROM of left arm. No pain or change in symptoms with flexion or extension of the left wrist.  Neurological: She is alert and oriented to person, place, and time. She has normal strength. No sensory deficit. She displays a negative Romberg sign. GCS eye subscore is 4. GCS verbal subscore is 5. GCS motor subscore is 6.  Normal finger to nose exam. Cranial nerves III through XII intact. 5/5 strength in left arm. 2+ radial pulse. No pallor to the hand. Good grip strength.   Skin: Skin is warm and dry.  Psychiatric: She has a normal mood and affect. Her behavior is normal.  Nursing note and vitals reviewed.   ED Course  Procedures (including critical care time) Labs Review Labs Reviewed  COMPREHENSIVE METABOLIC PANEL - Abnormal; Notable for the following:    Glucose, Bld 112 (*)    All other components within normal limits  CBC WITH DIFFERENTIAL/PLATELET    Imaging Review Ct Head Wo Contrast  08/27/2014   CLINICAL DATA:  Left hand numbness  EXAM: CT HEAD WITHOUT CONTRAST  TECHNIQUE: Contiguous axial images were obtained from the base of the skull through the vertex without intravenous contrast.  COMPARISON:  None.  FINDINGS: Bony calvarium is intact. No gross soft tissue abnormality is noted. The paranasal sinuses and mastoid air cells are well aerated.  No findings to suggest acute hemorrhage, acute infarction or space-occupying mass lesion are noted.  IMPRESSION: No acute intracranial abnormality seen.   Electronically Signed   By: Inez Catalina M.D.   On: 08/27/2014 14:11     EKG Interpretation None      MDM   Final diagnoses:  Left hand paresthesia   Patient presents for 3 months of dizziness and left  hand numbness since yesterday. She has no focal neurological deficit on exam. She is resting comfortably in bed and in no acute distress. Her vitals are stable. I'm not concerned for ACS, patient has no chest pain or shortness of breath. Her labs are unremarkable. Due to her dizziness I ordered a CT head which showed no acute intracranial abnormality. I feel comfortable discharging the patient home with close follow-up. Patient verbally agrees with the plan and I discussed strict return precautions.    Ottie Glazier, PA-C 08/27/14 1438  Merrily Pew, MD 08/27/14 1735

## 2014-08-27 NOTE — ED Notes (Signed)
Pt ambulatory with steady gait, independent. Denies dizziness. Denies sob or chest pain. NAD.

## 2014-08-27 NOTE — ED Notes (Signed)
Pt to ED for evaluation of left hand numbness. Denies chest pain or shortness of breath. Recently diagnosed with diabetes. Reports this started yesterday when she woke up but she thought she may have slept on her left arm so she didn't think anything of it. However it has not resolved and has only gotten worse. A/O x4. VSS.

## 2014-08-27 NOTE — ED Provider Notes (Signed)
Medical screening examination/treatment/procedure(s) were conducted as a shared visit with non-physician practitioner(s) and myself.  I personally evaluated the patient during the encounter.  49 yo F w/ isolated left hand paresthesias. No other neuro symptoms. Started after waking up yesterday. One episode of left arm feeling heavy while walking into the hospital today but no other neurologic complaints. Exam with normal neuro. Ambulates normall. L>R strength, no clonus or hyperreflexia. Sensation to light touch intact. Lungs CTAB, Heart RRR, no m/r/g. Likely peripheral in nature however patient is overly concerned about head bleed or stroke and wants a CT scan. I feel like these etiologies are unlikely however a CT scan is not unreasonable so one was done which was unremarkable. I still think she needs to see a neurologist for her peripheral paresthesias.    EKG Interpretation None        Merrily Pew, MD 08/27/14 2030

## 2014-09-01 ENCOUNTER — Ambulatory Visit: Payer: 59 | Admitting: Podiatry

## 2014-09-02 ENCOUNTER — Ambulatory Visit: Payer: 59 | Admitting: Podiatry

## 2014-09-09 ENCOUNTER — Encounter: Payer: Self-pay | Admitting: Internal Medicine

## 2014-09-09 ENCOUNTER — Ambulatory Visit (INDEPENDENT_AMBULATORY_CARE_PROVIDER_SITE_OTHER): Payer: 59 | Admitting: Internal Medicine

## 2014-09-09 VITALS — BP 120/90 | HR 81 | Temp 98.1°F | Ht 62.0 in | Wt 167.0 lb

## 2014-09-09 DIAGNOSIS — E1165 Type 2 diabetes mellitus with hyperglycemia: Secondary | ICD-10-CM | POA: Diagnosis not present

## 2014-09-09 DIAGNOSIS — I10 Essential (primary) hypertension: Secondary | ICD-10-CM

## 2014-09-09 DIAGNOSIS — M722 Plantar fascial fibromatosis: Secondary | ICD-10-CM

## 2014-09-09 DIAGNOSIS — R202 Paresthesia of skin: Secondary | ICD-10-CM

## 2014-09-09 DIAGNOSIS — R41840 Attention and concentration deficit: Secondary | ICD-10-CM

## 2014-09-09 DIAGNOSIS — E559 Vitamin D deficiency, unspecified: Secondary | ICD-10-CM

## 2014-09-09 DIAGNOSIS — E785 Hyperlipidemia, unspecified: Secondary | ICD-10-CM

## 2014-09-09 DIAGNOSIS — F329 Major depressive disorder, single episode, unspecified: Secondary | ICD-10-CM | POA: Diagnosis not present

## 2014-09-09 DIAGNOSIS — F32A Depression, unspecified: Secondary | ICD-10-CM

## 2014-09-09 NOTE — Assessment & Plan Note (Signed)
Continue Voltaren daily

## 2014-09-09 NOTE — Assessment & Plan Note (Signed)
A1C just checked last month- will request labs from previous PCP Continue Metformin and Tanzeum at this time Reinforced low carb diet No microalbumin secondary to ARB therapy Foot exam today Eye exam UTD Encouraged her to get a flu shot in the Fall 2016 Will discuss pneumovax/prevnar at her next visit

## 2014-09-09 NOTE — Assessment & Plan Note (Signed)
She never had a true diagnosis of ADD She will continue Vyvanse for now

## 2014-09-09 NOTE — Patient Instructions (Signed)

## 2014-09-09 NOTE — Progress Notes (Signed)
HPI  Pt presents to the clinic today to establish care and for management of the conditions listed below. She is transferring care from Dr. Dion Saucier.  Flu: 2015 Tetanus: 2014 Pneumovax: never Prevnar: never Pap Smear: 07/2014 Mammogram: 07/2014 Vision Screening: yearly Dentist: biannually  She does c/o tingling in her left hand. This has been constant since 7/27. She was seen at [facility] ER 7/28. Head CT normal and advised to follow up with PCP. She is left handed and it affects her hand writing and ability to grip things. She denies any injury to the wrist. She has not taken anything OTC.  Plantar Fasciitis: She is taking Voltaren daily. It does help.  Depression: Symptoms controlled on Prozac daily. She denies SI/HI.  ADD: She was diagnosed by Dr. Dion Saucier. She was never diagnosed by a psychiatrist. Thea Alken only as needed, about 2-3 times per week.  DM2: Her last A1C was checked 07/2014. She does not remember what it was but reports it is "bad". She is prescribed Metformin 500 mg BID but reports she only takes it once daily. She does have some associated diarrhea. She was recently started on Tanzeum once weekly. She does try to consume a low carb diet but does feel like she could be better at it.  HTN: BP well controlled on Olmesasrtan-HCTZ. Her last ECG was 11/2013.  HLD: She denies myalgias on Crestor. She does consume a low fat diet.  Past Medical History  Diagnosis Date  . Hypertension   . Depression   . Hyperlipidemia   . ADD (attention deficit disorder)   . Osteopenia   . 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 Pneumonia 2012  . Gestational diabetes 1999; 2002  . Type II diabetes mellitus     "borderline; I'm on Metformin"  . Ejection fraction   . History of chicken pox   . Allergy     Current Outpatient Prescriptions  Medication Sig Dispense Refill  . aspirin 81 MG chewable tablet Chew 81 mg by mouth daily.     . diclofenac (VOLTAREN) 75 MG EC tablet Take 1 tablet  (75 mg total) by mouth 2 (two) times daily. 50 tablet 2  . FLUoxetine (PROZAC) 40 MG capsule Take 40 mg by mouth daily.    Marland Kitchen lisdexamfetamine (VYVANSE) 20 MG capsule Take 1 capsule (20 mg total) by mouth daily. 30 capsule 0  . metFORMIN (GLUCOPHAGE-XR) 500 MG 24 hr tablet Take 500 mg by mouth 2 (two) times daily. BY MOUTH    . nitroGLYCERIN (NITROSTAT) 0.4 MG SL tablet Place 1 tablet (0.4 mg total) under the tongue every 5 (five) minutes as needed for chest pain. 30 tablet 12  . olmesartan-hydrochlorothiazide (BENICAR HCT) 40-25 MG per tablet Take 1 tablet by mouth daily.    Marland Kitchen TANZEUM 50 MG PEN Inject 1 each as directed once a week.  0  . rosuvastatin (CRESTOR) 40 MG tablet Take 1/2 - 1 tab daily (Patient taking differently: Take 1/2 - 1 tab daily BY MOUTH) 30 tablet 5   No current facility-administered medications for this visit.    Allergies  Allergen Reactions  . Sulfa Antibiotics     REACTION: nausea    Family History  Problem Relation Age of Onset  . Hyperlipidemia Mother   . Hyperlipidemia Father   . Hypertension Father   . Diabetes Father   . Heart disease Father   . Stroke Father   . Cancer Maternal Grandmother  Colon Cancer  . Alzheimer's disease Maternal Grandmother   . Heart disease Brother 48    Myocardial Infarction    Social History   Social History  . Marital Status: Divorced    Spouse Name: [name] (Separated)   . Number of Children: 2  . Years of Education: 13+    Occupational History  . Homemaker    . Student      [employer]    Social History Main Topics  . Smoking status: Never Smoker   . Smokeless tobacco: Never Used  . Alcohol Use: No     Comment: 12/19/2013 "might have a drink a couple times/year"  . Drug Use: No  . Sexual Activity:    Partners: Male   Other Topics Concern  . Not on file   Social History Narrative   Marital Status: Separated [name]) Significant Other [name])    Children:  Son (1) Daughter (1)    Pets: Dogs (2), Cat (1)     Living Situation: Lives with children.   Occupation: Full- Time Student    Education: [employer]    Tobacco Use: She has never smoked.     Alcohol Use:  Rarely   Drug Use:  None   Diet:  Regular   Exercise:  None   Hobbies: Shopping          ROS:  Constitutional: Denies fever, malaise, fatigue, headache or abrupt weight changes.  HEENT: Denies eye pain, eye redness, ear pain, ringing in the ears, wax buildup, runny nose, nasal congestion, bloody nose, or sore throat. Respiratory: Denies difficulty breathing, shortness of breath, cough or sputum production.   Cardiovascular: Denies chest pain, chest tightness, palpitations or swelling in the hands or feet.  Gastrointestinal: Denies abdominal pain, bloating, constipation, diarrhea or blood in the stool.  GU: Denies frequency, urgency, pain with urination, blood in urine, odor or discharge. Musculoskeletal: Denies decrease in range of motion, difficulty with gait, muscle pain or joint pain and swelling.  Skin: Denies redness, rashes, lesions or ulcercations.  Neurological: Pt reports tinglings in left hand. Denies dizziness, difficulty with memory, difficulty with speech or problems with balance and coordination.  Psych: Pt reports depression. Denies SI/HI.  No other specific complaints in a complete review of systems (except as listed in HPI above).  PE:  BP 120/90 mmHg  Pulse 81  Temp(Src) 98.1 F (36.7 C) (Oral)  Ht 5\' 2"  (1.575 m)  Wt 167 lb (75.751 kg)  BMI 30.54 kg/m2  SpO2 97% Wt Readings from Last 3 Encounters:  09/09/14 167 lb (75.751 kg)  02/09/14 170 lb 6.4 oz (77.293 kg)  12/22/13 169 lb (76.658 kg)    General: Appears her stated age, obese well developed, well nourished in NAD. HEENT: Head: normal shape and size; Eyes: sclera white, no icterus, conjunctiva pink, PERRLA and EOMs intact;  Cardiovascular: Normal rate and rhythm. S1,S2 noted.  No murmur, rubs or gallops noted. No JVD or BLE edema. No  carotid bruits noted. Pulmonary/Chest: Normal effort and positive vesicular breath sounds. No respiratory distress. No wheezes, rales or ronchi noted.  Abdomen: Soft and nontender. Normal bowel sounds, no bruits noted. No distention or masses noted. Liver, spleen and kidneys non palpable. Musculoskeletal: Normal flexion, extension and rotation of the wrists bilaterally. Equal grip strength. No joint swelling noted. Neurological: Alert and oriented. Sensation intact to bilateral hands. Negative Tinels. Positive Phalens. Psychiatric: Mood and affect normal. Behavior is normal. Judgment and thought content normal.     BMET  Component Value Date/Time   NA 139 08/27/2014 1153   NA 140 08/31/2011   K 3.7 08/27/2014 1153   CL 102 08/27/2014 1153   CO2 28 08/27/2014 1153   GLUCOSE 112* 08/27/2014 1153   BUN 14 08/27/2014 1153   BUN 17 08/31/2011   CREATININE 0.71 08/27/2014 1153   CREATININE 0.79 08/04/2013 0955   CREATININE 0.8 08/31/2011   CALCIUM 9.4 08/27/2014 1153   GFRNONAA >60 08/27/2014 1153   GFRNONAA >89 08/04/2013 0955   GFRAA >60 08/27/2014 1153   GFRAA >89 08/04/2013 0955    Lipid Panel     Component Value Date/Time   CHOL 153 02/16/2014 1045   CHOL 132 03/26/2013 1138   TRIG 149.0 02/16/2014 1045   TRIG 135 03/26/2013 1138   HDL 48.00 02/16/2014 1045   HDL 47 03/26/2013 1138   CHOLHDL 3 02/16/2014 1045   VLDL 29.8 02/16/2014 1045   LDLCALC 75 02/16/2014 1045   LDLCALC 58 03/26/2013 1138    CBC    Component Value Date/Time   WBC 9.4 08/27/2014 1153   WBC 10.0 08/31/2011   RBC 5.04 08/27/2014 1153   HGB 14.6 08/27/2014 1153   HCT 42.8 08/27/2014 1153   PLT 228 08/27/2014 1153   MCV 84.9 08/27/2014 1153   MCH 29.0 08/27/2014 1153   MCHC 34.1 08/27/2014 1153   RDW 12.7 08/27/2014 1153   LYMPHSABS 2.3 08/27/2014 1153   MONOABS 0.6 08/27/2014 1153   EOSABS 0.2 08/27/2014 1153   BASOSABS 0.1 08/27/2014 1153    Hgb A1C Lab Results  Component Value  Date   HGBA1C 6.4* 12/19/2013     Assessment and Plan:  Paresthesias of left hand:  ? Carpal Tunnel Will check TSH, B12 and Folate Advised her to get a cock up splint If pain persist, consider referral to neurology for EMG  RTC in 3 months to follow up chronic conditions

## 2014-09-09 NOTE — Assessment & Plan Note (Signed)
Continue current medication at this time

## 2014-09-09 NOTE — Assessment & Plan Note (Signed)
Will get most recent labs from previous PCP Continue Crestor at this time

## 2014-09-09 NOTE — Progress Notes (Signed)
Pre visit review using our clinic review tool, if applicable. No additional management support is needed unless otherwise documented below in the visit note. 

## 2014-09-09 NOTE — Assessment & Plan Note (Signed)
Continue Prozac at this time

## 2014-09-10 ENCOUNTER — Telehealth: Payer: Self-pay

## 2014-09-10 LAB — VITAMIN D 25 HYDROXY (VIT D DEFICIENCY, FRACTURES): VITD: 18.48 ng/mL — ABNORMAL LOW (ref 30.00–100.00)

## 2014-09-10 LAB — TSH: TSH: 0.98 u[IU]/mL (ref 0.35–4.50)

## 2014-09-10 LAB — VITAMIN B12: Vitamin B-12: 344 pg/mL (ref 211–911)

## 2014-09-10 LAB — FOLATE: Folate: 10.9 ng/mL (ref >5.9)

## 2014-09-10 NOTE — Telephone Encounter (Signed)
Pt left v/m requesting cb when 09/09/14 lab results are done.

## 2014-09-10 NOTE — Telephone Encounter (Signed)
See result note.  

## 2014-09-11 MED ORDER — VITAMIN D (ERGOCALCIFEROL) 1.25 MG (50000 UNIT) PO CAPS: 50000.0000 [IU] | ORAL_CAPSULE | ORAL | Status: DC

## 2014-09-11 NOTE — Addendum Note (Signed)
Addended by: Lurlean Nanny on: 09/11/2014 11:09 AM   Modules accepted: Orders

## 2014-09-17 ENCOUNTER — Ambulatory Visit: Payer: 59 | Admitting: Podiatry

## 2014-09-21 ENCOUNTER — Ambulatory Visit (INDEPENDENT_AMBULATORY_CARE_PROVIDER_SITE_OTHER): Payer: 59 | Admitting: Podiatry

## 2014-09-21 ENCOUNTER — Encounter: Payer: Self-pay | Admitting: Podiatry

## 2014-09-21 VITALS — BP 106/66 | HR 77 | Resp 16

## 2014-09-21 DIAGNOSIS — M722 Plantar fascial fibromatosis: Secondary | ICD-10-CM | POA: Diagnosis not present

## 2014-09-21 MED ORDER — TRIAMCINOLONE ACETONIDE 10 MG/ML IJ SUSP
10.0000 mg | Freq: Once | INTRAMUSCULAR | Status: AC
  Administered 2014-09-21: 10 mg

## 2014-09-21 NOTE — Patient Instructions (Signed)

## 2014-09-22 NOTE — Progress Notes (Signed)
Subjective:     Patient ID: Lindsay Rios, female   DOB: [birth year], 49 y.o.   MRN: [id number]  HPI patient states my right foot is still given me some trouble and I know on getting need something to lift my arches   Review of Systems     Objective:   Physical Exam Neurovascular status intact muscle strength adequate with moderate discomfort upon palpation to the plantar fascia that has improved with previous visit but is still present    Assessment:     Plantar fasciitis right improved but painful    Plan:     Reinjected the plantar fascia 3 mg Kenalog 5 mg Xylocaine advised on physical therapy supportive shoe and scanned for custom orthotics to reduce plantar pressure. Reappoint when returned

## 2014-09-24 ENCOUNTER — Institutional Professional Consult (permissible substitution): Payer: 59 | Admitting: Pulmonary Disease

## 2014-10-02 ENCOUNTER — Encounter: Payer: Self-pay | Admitting: Internal Medicine

## 2014-10-13 ENCOUNTER — Ambulatory Visit: Payer: 59 | Admitting: *Deleted

## 2014-10-13 DIAGNOSIS — M722 Plantar fascial fibromatosis: Secondary | ICD-10-CM

## 2014-10-13 NOTE — Patient Instructions (Signed)

## 2014-10-14 NOTE — Progress Notes (Signed)
Patient ID: Lindsay Rios, female   DOB: [birth year], 49 y.o.   MRN: [id number] Patient presents for orthotic pick up.  Verbal and written break in and wear instructions given.  Patient will follow up in 4 weeks if symptoms worsen or fail to improve.

## 2014-11-05 ENCOUNTER — Institutional Professional Consult (permissible substitution): Payer: 59 | Admitting: Pulmonary Disease

## 2014-11-16 ENCOUNTER — Ambulatory Visit (INDEPENDENT_AMBULATORY_CARE_PROVIDER_SITE_OTHER): Payer: 59 | Admitting: Podiatry

## 2014-11-16 ENCOUNTER — Encounter: Payer: Self-pay | Admitting: Podiatry

## 2014-11-16 VITALS — BP 127/75 | HR 61 | Resp 16

## 2014-11-16 DIAGNOSIS — M779 Enthesopathy, unspecified: Secondary | ICD-10-CM

## 2014-11-16 MED ORDER — DICLOFENAC SODIUM 75 MG PO TBEC: 75.0000 mg | DELAYED_RELEASE_TABLET | Freq: Two times a day (BID) | ORAL | Status: DC

## 2014-11-16 MED ORDER — TRIAMCINOLONE ACETONIDE 10 MG/ML IJ SUSP
10.0000 mg | Freq: Once | INTRAMUSCULAR | Status: AC
  Administered 2014-11-16: 10 mg

## 2014-11-16 NOTE — Progress Notes (Signed)
Subjective:     Patient ID: Lindsay Rios, female   DOB: 1965/08/24, 49 y.o.   MRN: [id number]  HPI patient states that she was on her feet all day and that her left outside of her ankle started to hurt her quite a bit and make it hard for her to walk   Review of Systems     Objective:   Physical Exam Neurovascular status intact muscle strength was adequate with no other pain noted with patient having tendinitis-like symptoms left outer side ankle with minimal discomfort on the right    Assessment:     Inflammatory tendinitis left over right    Plan:     Injected the left tendon sheath 3 mg Kenalog 5 mill grams Xylocaine and advised on heat stretch and ice therapy and reappoint to recheck

## 2014-12-02 ENCOUNTER — Telehealth: Payer: Self-pay

## 2014-12-02 DIAGNOSIS — E785 Hyperlipidemia, unspecified: Secondary | ICD-10-CM

## 2014-12-02 NOTE — Telephone Encounter (Signed)
Pt left v/m requesting refill crestor 40 mg; pt was seen 09/09/14 and advised to cont crestor; pt has appt 12/10/14 scheduled; left v/m for pt to cb with which pharmacy wants med sent to .

## 2014-12-03 MED ORDER — ROSUVASTATIN CALCIUM 40 MG PO TABS: ORAL_TABLET | ORAL | Status: DC

## 2014-12-03 NOTE — Telephone Encounter (Signed)
Pt called back and notified refill crestor # 30 to [facility] and will discuss refills at next appt.

## 2014-12-07 ENCOUNTER — Encounter: Payer: Self-pay | Admitting: Podiatry

## 2014-12-07 ENCOUNTER — Ambulatory Visit (INDEPENDENT_AMBULATORY_CARE_PROVIDER_SITE_OTHER): Payer: 59 | Admitting: Podiatry

## 2014-12-07 VITALS — BP 126/85 | HR 78 | Resp 16

## 2014-12-07 DIAGNOSIS — M779 Enthesopathy, unspecified: Secondary | ICD-10-CM

## 2014-12-08 NOTE — Progress Notes (Signed)
Subjective:     Patient ID: Lindsay Rios, female   DOB: [birth year], 49 y.o.   MRN: [id number]  HPI patient states that my foot is feeling quite a bit better and still having mild pain but improved   Review of Systems     Objective:   Physical Exam Neurovascular status intact muscle strength adequate with discomfort around the peroneal brevis insertion left that's moderate in intensity with mild to moderate plantar fasciitis bilateral    Assessment:     Improving of tendinitis-like symptoms bilateral    Plan:     Dispensed orthotics with instructions on usage and advised on physical therapy stretching exercises and supportive shoe gear. Reappoint to recheck in about 6 weeks

## 2014-12-10 ENCOUNTER — Ambulatory Visit (INDEPENDENT_AMBULATORY_CARE_PROVIDER_SITE_OTHER): Payer: 59 | Admitting: Internal Medicine

## 2014-12-10 ENCOUNTER — Encounter: Payer: Self-pay | Admitting: Internal Medicine

## 2014-12-10 VITALS — BP 122/74 | HR 85 | Temp 97.7°F | Wt 166.5 lb

## 2014-12-10 DIAGNOSIS — E119 Type 2 diabetes mellitus without complications: Secondary | ICD-10-CM

## 2014-12-10 DIAGNOSIS — M722 Plantar fascial fibromatosis: Secondary | ICD-10-CM | POA: Diagnosis not present

## 2014-12-10 DIAGNOSIS — I10 Essential (primary) hypertension: Secondary | ICD-10-CM

## 2014-12-10 DIAGNOSIS — Z23 Encounter for immunization: Secondary | ICD-10-CM

## 2014-12-10 DIAGNOSIS — F329 Major depressive disorder, single episode, unspecified: Secondary | ICD-10-CM

## 2014-12-10 DIAGNOSIS — F988 Other specified behavioral and emotional disorders with onset usually occurring in childhood and adolescence: Secondary | ICD-10-CM

## 2014-12-10 DIAGNOSIS — E785 Hyperlipidemia, unspecified: Secondary | ICD-10-CM | POA: Diagnosis not present

## 2014-12-10 DIAGNOSIS — IMO0001 Reserved for inherently not codable concepts without codable children: Secondary | ICD-10-CM

## 2014-12-10 DIAGNOSIS — R202 Paresthesia of skin: Secondary | ICD-10-CM

## 2014-12-10 DIAGNOSIS — F32A Depression, unspecified: Secondary | ICD-10-CM

## 2014-12-10 DIAGNOSIS — R200 Anesthesia of skin: Secondary | ICD-10-CM

## 2014-12-10 DIAGNOSIS — F909 Attention-deficit hyperactivity disorder, unspecified type: Secondary | ICD-10-CM

## 2014-12-10 LAB — LIPID PANEL
Cholesterol: 199 mg/dL (ref 0–200)
HDL: 49.3 mg/dL (ref >39.00)
LDL Cholesterol: 122 mg/dL — ABNORMAL HIGH (ref 0–99)
NonHDL: 149.95
Total CHOL/HDL Ratio: 4
Triglycerides: 139 mg/dL (ref 0.0–149.0)
VLDL: 27.8 mg/dL (ref 0.0–40.0)

## 2014-12-10 LAB — COMPREHENSIVE METABOLIC PANEL
ALT: 64 U/L — ABNORMAL HIGH (ref 0–35)
AST: 39 U/L — ABNORMAL HIGH (ref 0–37)
Albumin: 4.4 g/dL (ref 3.5–5.2)
Alkaline Phosphatase: 57 U/L (ref 39–117)
BUN: 20 mg/dL (ref 6–23)
CO2: 32 mEq/L (ref 19–32)
Calcium: 10.4 mg/dL (ref 8.4–10.5)
Chloride: 98 mEq/L (ref 96–112)
Creatinine, Ser: 0.9 mg/dL (ref 0.40–1.20)
GFR: 71.23 mL/min (ref >60.00)
Glucose, Bld: 118 mg/dL — ABNORMAL HIGH (ref 70–99)
Potassium: 3.7 mEq/L (ref 3.5–5.1)
Sodium: 139 mEq/L (ref 135–145)
Total Bilirubin: 0.6 mg/dL (ref 0.2–1.2)
Total Protein: 7.6 g/dL (ref 6.0–8.3)

## 2014-12-10 LAB — HEMOGLOBIN A1C: Hgb A1c MFr Bld: 6 % (ref 4.6–6.5)

## 2014-12-10 MED ORDER — TANZEUM 50 MG ~~LOC~~ PEN: 1.0000 | PEN_INJECTOR | SUBCUTANEOUS | Status: DC

## 2014-12-10 MED ORDER — ROSUVASTATIN CALCIUM 40 MG PO TABS: ORAL_TABLET | ORAL | Status: DC

## 2014-12-10 MED ORDER — OLMESARTAN MEDOXOMIL-HCTZ 40-25 MG PO TABS: 1.0000 | ORAL_TABLET | Freq: Every day | ORAL | Status: DC

## 2014-12-10 MED ORDER — LISDEXAMFETAMINE DIMESYLATE 10 MG PO CAPS: 1.0000 | ORAL_CAPSULE | Freq: Every day | ORAL | Status: DC

## 2014-12-10 MED ORDER — DICLOFENAC SODIUM 75 MG PO TBEC: 75.0000 mg | DELAYED_RELEASE_TABLET | Freq: Two times a day (BID) | ORAL | Status: DC

## 2014-12-10 MED ORDER — FLUOXETINE HCL 40 MG PO CAPS: 40.0000 mg | ORAL_CAPSULE | Freq: Every day | ORAL | Status: DC

## 2014-12-10 NOTE — Progress Notes (Signed)
Pre visit review using our clinic review tool, if applicable. No additional management support is needed unless otherwise documented below in the visit note. 

## 2014-12-10 NOTE — Progress Notes (Addendum)
Subjective:    Patient ID: Lindsay Rios, female    DOB: 1965-02-04, 49 y.o.   MRN: [id number]  HPI  Pt presents to the clinic today for 3 month follow up of chronic conditions:  Plantar Fasciitis: She is taking Voltaren daily. It does help.  Depression: Symptoms controlled on Prozac daily. She denies SI/HI.  ADD: She was diagnosed by Dr. Dion Saucier. She was never diagnosed by a psychiatrist. Thea Alken only as needed, about 2-3 times per week. She took it mostly while she was in school. She has graduated and she wants to decrease stopping it.  DM2: She does not check her sugars at home. Her last A1C was checked 07/2014, 6.5%. She is prescribed Metformin 500 mg BID but reports she stopped taking it secondary to diarrhea. She is also on Tanzeum once weekly. She does try to consume a low carb diet but does feel like she could be better at it. Her last eye exam was 1 year ago. Flu 09/2013. Pneumovax, never. Prevnar 07/2014.  HTN: BP well controlled on Olmesasrtan-HCTZ. Her BP today is 122/74. Her last ECG was 11/2013.  HLD: Her last LDL was 107. She denies myalgias on Crestor, but she has been out of her medication x 1 month. She does take ASA daily. She does consume a low fat diet.  Vit D deficiency: She is on Ergocalciferol. She has 3 weeks left. She continues to feel tired and have numbness in her fingers. Her TSH and B12 are both normal.  Review of Systems      Past Medical History  Diagnosis Date  . Hypertension   . Depression   . Hyperlipidemia   . ADD (attention deficit disorder)   . Osteopenia   . 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 Pneumonia 2012  . Gestational diabetes 1999; 2002  . Type II diabetes mellitus ([place])     "borderline; I'm on Metformin"  . Ejection fraction   . History of chicken pox   . Allergy     Current Outpatient Prescriptions  Medication Sig Dispense Refill  . aspirin 81 MG chewable tablet Chew 81 mg by mouth daily.     . diclofenac  (VOLTAREN) 75 MG EC tablet Take 1 tablet (75 mg total) by mouth 2 (two) times daily. 50 tablet 2  . diclofenac (VOLTAREN) 75 MG EC tablet Take 1 tablet (75 mg total) by mouth 2 (two) times daily. 50 tablet 2  . FLUoxetine (PROZAC) 40 MG capsule Take 40 mg by mouth daily.    Marland Kitchen lisdexamfetamine (VYVANSE) 20 MG capsule Take 1 capsule (20 mg total) by mouth daily. 30 capsule 0  . metFORMIN (GLUCOPHAGE-XR) 500 MG 24 hr tablet Take 500 mg by mouth 2 (two) times daily. BY MOUTH    . nitroGLYCERIN (NITROSTAT) 0.4 MG SL tablet Place 1 tablet (0.4 mg total) under the tongue every 5 (five) minutes as needed for chest pain. 30 tablet 12  . olmesartan-hydrochlorothiazide (BENICAR HCT) 40-25 MG per tablet Take 1 tablet by mouth daily.    . rosuvastatin (CRESTOR) 40 MG tablet Take 1/2 - 1 tab daily BY MOUTH 30 tablet 0  . TANZEUM 50 MG PEN Inject 1 each as directed once a week.  0  . Vitamin D, Ergocalciferol, (DRISDOL) 50000 UNITS CAPS capsule Take 1 capsule (50,000 Units total) by mouth every 7 (seven) days. 12 capsule 0   No current facility-administered medications for this visit.    Allergies  Allergen Reactions  . Sulfa Antibiotics     REACTION: nausea    Family History  Problem Relation Age of Onset  . Hyperlipidemia Mother   . Hyperlipidemia Father   . Hypertension Father   . Diabetes Father   . Heart disease Father   . Stroke Father   . Cancer Maternal Grandmother     Colon Cancer  . Alzheimer's disease Maternal Grandmother   . Heart disease Brother 24    Myocardial Infarction    Social History   Social History  . Marital Status: Divorced    Spouse Name: [name] (Separated)   . Number of Children: 2  . Years of Education: 13+    Occupational History  . Homemaker    . Student      [employer]    Social History Main Topics  . Smoking status: Never Smoker   . Smokeless tobacco: Never Used  . Alcohol Use: No     Comment: 12/19/2013 "might have a drink a couple times/year"  . Drug  Use: No  . Sexual Activity:    Partners: Male   Other Topics Concern  . Not on file   Social History Narrative   Marital Status: Separated [name]) Significant Other [name])    Children:  Son (1) Daughter (1)    Pets: Dogs (2), Cat (1)    Living Situation: Lives with children.   Occupation: Full- Time Student    Education: [employer] ([place])    Tobacco Use: She has never smoked.     Alcohol Use:  Rarely   Drug Use:  None   Diet:  Regular   Exercise:  None   Hobbies: Shopping           Constitutional: Denies fever, malaise, fatigue, headache or abrupt weight changes.  HEENT: Denies eye pain, eye redness, ear pain, ringing in the ears, wax buildup, runny nose, nasal congestion, bloody nose, or sore throat. Respiratory: Denies difficulty breathing, shortness of breath, cough or sputum production.   Cardiovascular: Denies chest pain, chest tightness, palpitations or swelling in the hands or feet.  Gastrointestinal: Denies abdominal pain, bloating, constipation, diarrhea or blood in the stool.  GU: Denies frequency, urgency, pain with urination, blood in urine, odor or discharge. Musculoskeletal: Denies decrease in range of motion, difficulty with gait, muscle pain or joint pain and swelling.  Skin: Denies redness, rashes, lesions or ulcercations.  Neurological: Pt reports tinglings in left hand, mainly 4th and 5th fingers. Denies dizziness, difficulty with memory, difficulty with speech or problems with balance and coordination.  Psych: Pt reports depression. Denies SI/HI.   No other specific complaints in a complete review of systems (except as listed in HPI above).  Objective:   Physical Exam  BP 122/74 mmHg  Pulse 85  Temp(Src) 97.7 F (36.5 C) (Oral)  Wt 166 lb 8 oz (75.524 kg)  SpO2 98%   General: Appears her stated age, obese well developed, well nourished in NAD. HEENT: Head: normal shape and size; Eyes: sclera white, no icterus, conjunctiva pink, PERRLA and EOMs  intact;   Cardiovascular: Normal rate and rhythm. S1,S2 noted.  No murmur, rubs or gallops noted. No JVD or BLE edema. No carotid bruits noted. Pulmonary/Chest: Normal effort and positive vesicular breath sounds. No respiratory distress. No wheezes, rales or ronchi noted.  Abdomen: Soft and nontender. Normal bowel sounds, no bruits noted. No distention or masses noted. Liver, spleen and kidneys non palpable. Musculoskeletal: Normal flexion, extension and rotation of the wrists bilaterally.  Equal grip strength. No joint swelling noted. Neurological: Alert and oriented. Sensation intact to bilateral hands. Negative Tinels. Negative Phalens. Psychiatric: Mood and affect normal. Behavior is normal. Judgment and thought content normal.        Assessment & Plan:   Plantar Fasciitis: -Continue Voltaren Daily  Depression: -Continue Prozac  ADD: -Continue Vyvanse  DM 2: -Will repeat A1C today -No microalbumin secondary to ARB therapy -Advised her to make an appt for an eye exam -Flu shot today -Prevnar UTD- -Pneumovax due 07/2015 -Continue Metformin and Tanzeum  HTN:  -Continue Olmesartan-HCT  HLD: -Will check LDL and CMET today -Will adjust Crestor if needed -Advised her to consume a low fat diet -Foot exam today  Tingling in left hand: -Does not appear to be CTS -Will refer to Dr. Leonie Man for EMG  RTC in 6 months for annual exam/followup

## 2014-12-10 NOTE — Progress Notes (Signed)
Subjective:    Patient ID: Lindsay Rios, female    DOB: November 16, 1965, 49 y.o.   MRN: [id number]  HPI Lindsay Rios is a 49 year old female who presents today for 3 month follow up of her chronic conditions.  She feels very tired today.  1. HTN -  Currently takes Benicar HCT.  Blood pressures within desirable range. Todays BP is 122/74. Does not take home BP's. No chest pain.  2. DM- Currently takes Tanzeum. Quit taking Metformin on her own because she was having severe diarrhea. Does not take home blood sugars. No hypoglycemic events.  3. HL- Currently takes Crestor, hasn't taken in a month.  Takes a 81mg  ASA daily. Denies myalgias.  4. Depression - Currently on Prozac. Denies SI/HI. She feels symptoms are controlled with meds.  5. ADD - Takes Vyvanse. Has not taken it in the past few days, would like to consider weaning off of it or reducing the dose.  6. Plantar Fasciitis - Sees podiatry.  7. Vitamin D deficiency - Takes weekly vitamin D.  8. Fatigue - Feels tired all the time. TSH checked in August and was within range.  9. Carpal Tunnel - Has consistent numbness in the left hand, ring finger and pinky.  Review of Systems  Constitutional: Positive for fatigue. Negative for fever and chills.  HENT: Negative.   Respiratory: Negative for cough, shortness of breath and wheezing.   Cardiovascular: Negative for chest pain, palpitations and leg swelling.  Gastrointestinal: Negative.   Endocrine: Negative for polydipsia, polyphagia and polyuria.  Genitourinary: Negative for dysuria, frequency and hematuria.  Musculoskeletal: Negative for myalgias, joint swelling and arthralgias.  Skin: Negative.   Neurological: Negative for dizziness, light-headedness and headaches.  Psychiatric/Behavioral: Negative for self-injury and decreased concentration. The patient is not nervous/anxious.    Family History  Problem Relation Age of Onset  . Hyperlipidemia Mother   . Hyperlipidemia Father     . Hypertension Father   . Diabetes Father   . Heart disease Father   . Stroke Father   . Cancer Maternal Grandmother     Colon Cancer  . Alzheimer's disease Maternal Grandmother   . Heart disease Brother 40    Myocardial Infarction   Current Outpatient Prescriptions on File Prior to Visit  Medication Sig Dispense Refill  . aspirin 81 MG chewable tablet Chew 81 mg by mouth daily.     . diclofenac (VOLTAREN) 75 MG EC tablet Take 1 tablet (75 mg total) by mouth 2 (two) times daily. 50 tablet 2  . FLUoxetine (PROZAC) 40 MG capsule Take 40 mg by mouth daily.    Marland Kitchen lisdexamfetamine (VYVANSE) 20 MG capsule Take 1 capsule (20 mg total) by mouth daily. 30 capsule 0  . nitroGLYCERIN (NITROSTAT) 0.4 MG SL tablet Place 1 tablet (0.4 mg total) under the tongue every 5 (five) minutes as needed for chest pain. 30 tablet 12  . olmesartan-hydrochlorothiazide (BENICAR HCT) 40-25 MG per tablet Take 1 tablet by mouth daily.    . rosuvastatin (CRESTOR) 40 MG tablet Take 1/2 - 1 tab daily BY MOUTH 30 tablet 0  . TANZEUM 50 MG PEN Inject 1 each as directed once a week.  0  . Vitamin D, Ergocalciferol, (DRISDOL) 50000 UNITS CAPS capsule Take 1 capsule (50,000 Units total) by mouth every 7 (seven) days. 12 capsule 0  . metFORMIN (GLUCOPHAGE-XR) 500 MG 24 hr tablet Take 500 mg by mouth 2 (two) times daily. BY MOUTH     No  current facility-administered medications on file prior to visit.       Objective:   Physical Exam  Constitutional: She is oriented to person, place, and time. She appears well-developed and well-nourished. No distress.  HENT:  Head: Normocephalic and atraumatic.  Eyes: Pupils are equal, round, and reactive to light.  Neck: Normal range of motion. Neck supple.  Cardiovascular: Normal rate, regular rhythm, normal heart sounds and intact distal pulses.   No murmur heard. Pulmonary/Chest: Effort normal and breath sounds normal. No respiratory distress.  Abdominal: Soft. Bowel sounds are  normal.  Musculoskeletal: Normal range of motion.  Neurological: She is alert and oriented to person, place, and time.  Skin: Skin is warm and dry. She is not diaphoretic.  Psychiatric: She has a normal mood and affect.          Assessment & Plan:  1. HTN - Continue Benicar HCT. CMP today. Encouraged low salt diet.  2. DM - Will check A1C today. Continue Tanzeum. Stay off Metformin.  If A1c is high, start glipizide.  3. HL - Continue Crestor. Will check lipids today. CMP today 4. Depression - Continue Prozac.  5. ADD - Will reduce Vyvanse dose.  6. Plantar Fasciitis  - Continue with podiatry.  7. Vitamin D deficiency - Continue weekly supplement.  8. Fatigue - b12, Vitamin D within range. Encouraged her to quit taking late afternoon naps, and go to bed earlier to ensure that she gets a full nights sleep. Also encouraged her to start an exercise plan, and continue to try to lose weight.  9. Carpal Tunnel - Will refer to Neuro, wants to see Dr. Leonie Man.

## 2014-12-10 NOTE — Patient Instructions (Signed)

## 2014-12-21 ENCOUNTER — Encounter: Payer: Self-pay | Admitting: Neurology

## 2014-12-21 ENCOUNTER — Ambulatory Visit (INDEPENDENT_AMBULATORY_CARE_PROVIDER_SITE_OTHER): Payer: 59 | Admitting: Neurology

## 2014-12-21 VITALS — BP 122/85 | HR 76 | Ht 62.0 in | Wt 168.0 lb

## 2014-12-21 DIAGNOSIS — R208 Other disturbances of skin sensation: Secondary | ICD-10-CM | POA: Diagnosis not present

## 2014-12-21 DIAGNOSIS — M542 Cervicalgia: Secondary | ICD-10-CM

## 2014-12-21 DIAGNOSIS — R200 Anesthesia of skin: Secondary | ICD-10-CM | POA: Insufficient documentation

## 2014-12-21 DIAGNOSIS — M6289 Other specified disorders of muscle: Secondary | ICD-10-CM

## 2014-12-21 DIAGNOSIS — R29898 Other symptoms and signs involving the musculoskeletal system: Secondary | ICD-10-CM | POA: Insufficient documentation

## 2014-12-21 NOTE — Progress Notes (Addendum)
PATIENT: Lindsay Rios DOB: February 16, 1965  Chief Complaint  Patient presents with  . Numbness    She woke up with left-handed numbness in July.  She is now having numbness in just her left 4th and 5th fingers.  Her symptoms are constant and cause her to drop things frequently.       HISTORICAL  Lindsay Rios is a 49 years old left-handed female, seen in refer by her primary care nurse practitioner [name] in November 20 first 2016 for evaluation of left hand numbness.  She had a history of hypertension depression hyperlipidemia ADHD, woke up in August 26 2014, self left hand numbness, whole hand numbness last about 1 day, gradually disappeared, but ever since the event, she had a persistent left fourth, and fifth finger numbness, she also noticed mild weakness, drop things from her left hand,  She fell to neck muscle strain cross her bilateral shoulder, no radiating pain from neck to her left arm, she has chronic low back pain, no radiating pain to her back,  She also complains of mild unbalanced sensation, urinary and bowel urgency.  REVIEW OF SYSTEMS: Full 14 system review of systems performed and notable only for as above  ALLERGIES: Allergies  Allergen Reactions  . Sulfa Antibiotics     REACTION: nausea    HOME MEDICATIONS: Current Outpatient Prescriptions  Medication Sig Dispense Refill  . aspirin 81 MG chewable tablet Chew 81 mg by mouth daily.     . diclofenac (VOLTAREN) 75 MG EC tablet Take 1 tablet (75 mg total) by mouth 2 (two) times daily. 50 tablet 2  . FLUoxetine (PROZAC) 40 MG capsule Take 1 capsule (40 mg total) by mouth daily. 30 capsule 5  . lisdexamfetamine (VYVANSE) 20 MG capsule Take 1 capsule (20 mg total) by mouth daily. 30 capsule 0  . Lisdexamfetamine Dimesylate (VYVANSE) 10 MG CAPS Take 1 capsule by mouth daily. 30 capsule 0  . nitroGLYCERIN (NITROSTAT) 0.4 MG SL tablet Place 1 tablet (0.4 mg total) under the tongue every 5 (five)  minutes as needed for chest pain. 30 tablet 12  . olmesartan-hydrochlorothiazide (BENICAR HCT) 40-25 MG tablet Take 1 tablet by mouth daily. 30 tablet 5  . rosuvastatin (CRESTOR) 40 MG tablet Take 1/2 - 1 tab daily BY MOUTH 30 tablet 5  . TANZEUM 50 MG PEN Inject 1 each as directed once a week. 4 each 5  . Vitamin D, Ergocalciferol, (DRISDOL) 50000 UNITS CAPS capsule Take 1 capsule (50,000 Units total) by mouth every 7 (seven) days. 12 capsule 0   No current facility-administered medications for this visit.    PAST MEDICAL HISTORY: Past Medical History  Diagnosis Date  . Hypertension   . Depression   . Hyperlipidemia   . ADD (attention deficit disorder)   . Osteopenia   . 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 Pneumonia 2012  . Gestational diabetes 1999; 2002  . Type II diabetes mellitus ([place])     "borderline; I'm on Metformin"  . Ejection fraction   . History of chicken pox   . Allergy   . Numbness and tingling     PAST SURGICAL HISTORY: Past Surgical History  Procedure Laterality Date  . Shoulder arthroscopy w/ rotator cuff repair Right 1990's  . Vaginal hysterectomy  2013    HPV/Cervical Dysplasia; partial  . Left heart catheterization with coronary angiogram N/A 12/22/2013    Procedure: LEFT HEART CATHETERIZATION WITH CORONARY ANGIOGRAM;  Surgeon: Jettie Booze, MD;  Location: [facility] CATH LAB;  Service: Cardiovascular;  Laterality: N/A;    FAMILY HISTORY: Family History  Problem Relation Age of Onset  . Hyperlipidemia Mother   . Hyperlipidemia Father   . Hypertension Father   . Diabetes Father   . Heart disease Father   . Stroke Father   . Cancer Maternal Grandmother     Colon Cancer  . Alzheimer's disease Maternal Grandmother   . Heart disease Brother 49    Myocardial Infarction    SOCIAL HISTORY:  Social History   Social History  . Marital Status: Divorced    Spouse Name: [name] (Separated)   . Number of Children: 2  . Years of Education: 14    Occupational History  . 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 Smoking status: Never Smoker   . Smokeless tobacco: Never Used  . Alcohol Use: No     Comment: 12/19/2013 "might have a drink a couple times/year"  . Drug Use: No  . Sexual Activity:    Partners: Male   Other Topics Concern  . Not on file   Social History Narrative   Marital Status: Separated [name]) Significant Other [name])    Children:  Son (1) Daughter (1)    Pets: Dogs (2), Cat (1)    Living Situation: Lives with children.   Occupation: Full- Time Student    Education: [employer] ([place])    Tobacco Use: She has never smoked.     Alcohol Use:  Rarely   Drug Use:  None   Diet:  Regular   Exercise:  None   Hobbies: Shopping           PHYSICAL EXAM   Filed Vitals:   12/21/14 0749  BP: 122/85  Pulse: 76  Height: 5\' 2"  (1.575 m)  Weight: 168 lb (76.204 kg)    Not recorded      Body mass index is 30.72 kg/(m^2).  PHYSICAL EXAMNIATION:  Gen: NAD, conversant, well nourised, obese, well groomed                     Cardiovascular: Regular rate rhythm, no peripheral edema, warm, nontender. Eyes: Conjunctivae clear without exudates or hemorrhage Neck: Supple, no carotid bruise. Pulmonary: Clear to auscultation bilaterally   NEUROLOGICAL EXAM:  MENTAL STATUS: Speech:    Speech is normal; fluent and spontaneous with normal comprehension.  Cognition:     Orientation to time, place and person     Normal recent and remote memory     Normal Attention span and concentration     Normal Language, naming, repeating,spontaneous speech     Fund of knowledge   CRANIAL NERVES: CN II: Visual fields are full to confrontation. Fundoscopic exam is normal with sharp discs and no vascular changes. Pupils are round equal and briskly reactive to light. CN III, IV, VI: extraocular movement are normal. No ptosis. CN V: Facial sensation is intact to pinprick in  all 3 divisions bilaterally. Corneal responses are intact.  CN VII: Face is symmetric with normal eye closure and smile. CN VIII: Hearing is normal to rubbing fingers CN IX, X: Palate elevates symmetrically. Phonation is normal. CN XI: Head turning and shoulder shrug are intact CN XII: Tongue is midline with normal movements and no atrophy.  MOTOR: There is no pronator drift of out-stretched arms. Muscle bulk and tone are  normal. Muscle strength is normal, with exception of mild left finger abduction, grip weakness.  REFLEXES: Reflexes are 3 and symmetric at the biceps, triceps, knees, and ankles. Plantar responses are flexor.  SENSORY: Intact to light touch, pinprick, position sense, and vibration sense are intact in fingers and toes.  COORDINATION: Rapid alternating movements and fine finger movements are intact. There is no dysmetria on finger-to-nose and heel-knee-shin.    GAIT/STANCE: Posture is normal. Gait is steady with normal steps, base, arm swing, and turning. Heel and toe walking are normal. Tandem gait is normal.  Romberg is absent.   DIAGNOSTIC DATA (LABS, IMAGING, TESTING) - I reviewed patient records, labs, notes, testing and imaging myself where available.   ASSESSMENT AND PLAN  Lindsay Rios is a 49 y.o. 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 Ph.D.  [facility] [address], [place], [place] [address] Ph: [phone] Fax: [phone]  CC: [name], NP

## 2015-01-06 ENCOUNTER — Ambulatory Visit (INDEPENDENT_AMBULATORY_CARE_PROVIDER_SITE_OTHER): Payer: 59

## 2015-01-06 DIAGNOSIS — M6289 Other specified disorders of muscle: Secondary | ICD-10-CM | POA: Diagnosis not present

## 2015-01-06 DIAGNOSIS — R29898 Other symptoms and signs involving the musculoskeletal system: Secondary | ICD-10-CM

## 2015-01-06 DIAGNOSIS — R208 Other disturbances of skin sensation: Secondary | ICD-10-CM

## 2015-01-06 DIAGNOSIS — M542 Cervicalgia: Secondary | ICD-10-CM

## 2015-01-06 DIAGNOSIS — R200 Anesthesia of skin: Secondary | ICD-10-CM

## 2015-01-08 ENCOUNTER — Telehealth: Payer: Self-pay | Admitting: Neurology

## 2015-01-08 NOTE — Telephone Encounter (Signed)
MRI cervical  1. At C5-C6 there is spinal stenosis due to right greater than left uncovertebral spurring and right paramedian disc protrusion. There is moderately severe right and moderate left foraminal narrowing. This could lead to right C6 nerve root compression. 2. At C6-C7 there is broad disc bulging but there does not appear to be any nerve root impingement.  Will see patient in 01/11/2015 for EMG/NCS.

## 2015-01-11 ENCOUNTER — Ambulatory Visit (INDEPENDENT_AMBULATORY_CARE_PROVIDER_SITE_OTHER): Payer: 59 | Admitting: Neurology

## 2015-01-11 ENCOUNTER — Telehealth: Payer: Self-pay

## 2015-01-11 DIAGNOSIS — M542 Cervicalgia: Secondary | ICD-10-CM | POA: Diagnosis not present

## 2015-01-11 DIAGNOSIS — I10 Essential (primary) hypertension: Secondary | ICD-10-CM

## 2015-01-11 DIAGNOSIS — R29898 Other symptoms and signs involving the musculoskeletal system: Secondary | ICD-10-CM

## 2015-01-11 DIAGNOSIS — M4712 Other spondylosis with myelopathy, cervical region: Secondary | ICD-10-CM | POA: Diagnosis not present

## 2015-01-11 DIAGNOSIS — R200 Anesthesia of skin: Secondary | ICD-10-CM

## 2015-01-11 DIAGNOSIS — G5622 Lesion of ulnar nerve, left upper limb: Secondary | ICD-10-CM | POA: Insufficient documentation

## 2015-01-11 DIAGNOSIS — E119 Type 2 diabetes mellitus without complications: Secondary | ICD-10-CM | POA: Diagnosis not present

## 2015-01-11 NOTE — Progress Notes (Signed)
PATIENT: Lindsay Rios DOB: 1966-01-20  No chief complaint on file.    HISTORICAL  Lindsay Rios is a 49 years old left-handed female, seen in refer by her primary care nurse practitioner [name] in November 20 first 2016 for evaluation of left hand numbness.  She had a history of hypertension depression hyperlipidemia ADHD, woke up in August 26 2014, self left hand numbness, whole hand numbness last about 1 day, gradually disappeared, but ever since the event, she had a persistent left fourth, and fifth finger numbness, she also noticed mild weakness, drop things from her left hand,  She fell to neck muscle strain cross her bilateral shoulder, no radiating pain from neck to her left arm, she has chronic low back pain, no radiating pain to her back,  She also complains of mild unbalanced sensation, urinary and bowel urgency.  UPDATE Jan 11 2015: We have reviewed MRI cervical spine: At C5-C6 there is spinal stenosis due to right greater than left uncovertebral spurring and right paramedian disc protrusion. There is moderately severe right and moderate left foraminal narrowing. This could lead to right C6 nerve root compression. At C6-C7 there is broad disc bulging but there does not appear to be any nerve root impingement.  She complains of mild unsteady gait, urinary urgency, occasionally bowel urgency,  She returned for electrodiagnostic study today which demonstrate slight demyelinating left ulnar neuropathy most likely across left elbow, she does has left elbow Tinel sign  REVIEW OF SYSTEMS: Full 14 system review of systems performed and notable only for as above  ALLERGIES: Allergies  Allergen Reactions  . Sulfa Antibiotics     REACTION: nausea    HOME MEDICATIONS: Current Outpatient Prescriptions  Medication Sig Dispense Refill  . aspirin 81 MG chewable tablet Chew 81 mg by mouth daily.     . diclofenac (VOLTAREN) 75 MG EC tablet Take 1 tablet (75  mg total) by mouth 2 (two) times daily. 50 tablet 2  . FLUoxetine (PROZAC) 40 MG capsule Take 1 capsule (40 mg total) by mouth daily. 30 capsule 5  . lisdexamfetamine (VYVANSE) 20 MG capsule Take 1 capsule (20 mg total) by mouth daily. 30 capsule 0  . Lisdexamfetamine Dimesylate (VYVANSE) 10 MG CAPS Take 1 capsule by mouth daily. 30 capsule 0  . nitroGLYCERIN (NITROSTAT) 0.4 MG SL tablet Place 1 tablet (0.4 mg total) under the tongue every 5 (five) minutes as needed for chest pain. 30 tablet 12  . olmesartan-hydrochlorothiazide (BENICAR HCT) 40-25 MG tablet Take 1 tablet by mouth daily. 30 tablet 5  . rosuvastatin (CRESTOR) 40 MG tablet Take 1/2 - 1 tab daily BY MOUTH 30 tablet 5  . TANZEUM 50 MG PEN Inject 1 each as directed once a week. 4 each 5  . Vitamin D, Ergocalciferol, (DRISDOL) 50000 UNITS CAPS capsule Take 1 capsule (50,000 Units total) by mouth every 7 (seven) days. 12 capsule 0   No current facility-administered medications for this visit.    PAST MEDICAL HISTORY: Past Medical History  Diagnosis Date  . Hypertension   . Depression   . Hyperlipidemia   . ADD (attention deficit disorder)   . Osteopenia   . 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 Pneumonia 2012  . Gestational diabetes 1999; 2002  . Type II diabetes mellitus ([place])     "borderline; I'm on Metformin"  . Ejection fraction   . History of chicken pox   . Allergy   .  Numbness and tingling     PAST SURGICAL HISTORY: Past Surgical History  Procedure Laterality Date  . Shoulder arthroscopy w/ rotator cuff repair Right 1990's  . Vaginal hysterectomy  2013    HPV/Cervical Dysplasia; partial  . Left heart catheterization with coronary angiogram N/A 12/22/2013    Procedure: LEFT HEART CATHETERIZATION WITH CORONARY ANGIOGRAM;  Surgeon: Jettie Booze, MD;  Location[facility] CATH LAB;  Service: Cardiovascular;  Laterality: N/A;    FAMILY HISTORY: Family History  Problem Relation Age of Onset  . Hyperlipidemia  Mother   . Hyperlipidemia Father   . Hypertension Father   . Diabetes Father   . Heart disease Father   . Stroke Father   . Cancer Maternal Grandmother     Colon Cancer  . Alzheimer's disease Maternal Grandmother   . Heart disease Brother 1    Myocardial Infarction    SOCIAL HISTORY:  Social History   Social History  . Marital Status: Divorced    Spouse Name: [name] (Separated)   . Number of Children: 2  . Years of Education: 14   Occupational History  . 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 Smoking status: Never Smoker   . Smokeless tobacco: Never Used  . Alcohol Use: No     Comment: 12/19/2013 "might have a drink a couple times/year"  . Drug Use: No  . Sexual Activity:    Partners: Male   Other Topics Concern  . Not on file   Social History Narrative   Marital Status: Separated [name]) Significant Other [name])    Children:  Son (1) Daughter (1)    Pets: Dogs (2), Cat (1)    Living Situation: Lives with children.   Occupation: Full- Time Student    Education: [employer] ([employer])    Tobacco Use: She has never smoked.     Alcohol Use:  Rarely   Drug Use:  None   Diet:  Regular   Exercise:  None   Hobbies: Shopping           PHYSICAL EXAM   There were no vitals filed for this visit.  Not recorded      There is no weight on file to calculate BMI.  PHYSICAL EXAMNIATION:  Gen: NAD, conversant, well nourised, obese, well groomed                     Cardiovascular: Regular rate rhythm, no peripheral edema, warm, nontender. Eyes: Conjunctivae clear without exudates or hemorrhage Neck: Supple, no carotid bruise. Pulmonary: Clear to auscultation bilaterally   NEUROLOGICAL EXAM:  MENTAL STATUS: Speech:    Speech is normal; fluent and spontaneous with normal comprehension.  Cognition:     Orientation to time, place and person     Normal recent and remote memory     Normal Attention span and  concentration     Normal Language, naming, repeating,spontaneous speech     Fund of knowledge   CRANIAL NERVES: CN II: Visual fields are full to confrontation. Fundoscopic exam is normal with sharp discs and no vascular changes. Pupils are round equal and briskly reactive to light. CN III, IV, VI: extraocular movement are normal. No ptosis. CN V: Facial sensation is intact to pinprick in all 3 divisions bilaterally. Corneal responses are intact.  CN VII: Face is symmetric with normal eye closure and smile. CN VIII: Hearing is normal to rubbing  fingers CN IX, X: Palate elevates symmetrically. Phonation is normal. CN XI: Head turning and shoulder shrug are intact CN XII: Tongue is midline with normal movements and no atrophy.  MOTOR: There is no pronator drift of out-stretched arms. Muscle bulk and tone are normal. Muscle strength is normal, with exception of mild left finger abduction, grip weakness. She has left elbow Tinel sign  REFLEXES: Reflexes are 3 and symmetric at the biceps, triceps, knees, and ankles. Plantar responses are flexor.  SENSORY: Intact to light touch, pinprick, position sense, and vibration sense are intact in fingers and toes.  COORDINATION: Rapid alternating movements and fine finger movements are intact. There is no dysmetria on finger-to-nose and heel-knee-shin.    GAIT/STANCE: Posture is normal. Gait is steady with normal steps, base, arm swing, and turning. Heel and toe walking are normal. Tandem gait is normal.  Romberg is absent.   DIAGNOSTIC DATA (LABS, IMAGING, TESTING) - I reviewed patient records, labs, notes, testing and imaging myself where available.   ASSESSMENT AND PLAN  Lindsay Rios is a 49 y.o. female  left fourth and fifth finger paresthesia  Left ulnar neuropathy   Mild demyelinating nature, she has left elbow Tinel signs, not a candidate for surgical decompression  Cervical spondylitic myelopathy  MRI of cervical spine  showed evidence of mild to moderate C5-6 stenosis  Hyperreflexia on examination, she also complains of mild unsteady gait, urinary or bowel urgency  Referred to neurosurgeon for evaluation   Marcial Pacas, M.D. Ph.D.  [facility] [address] [place], [place] [address] Ph: [phone] Fax: [phone]  CC: [name], NP

## 2015-01-11 NOTE — Patient Instructions (Signed)
[facility]   [place] [address] [place], [place] [address] Phone: [phone]

## 2015-01-11 NOTE — Telephone Encounter (Signed)
Called patient to offer appt w/ NP-Megan Millikan. Patient f/u is scheduled fro 05/2015. Will remain on Dr. Rhea Belton schedule.

## 2015-01-11 NOTE — Procedures (Signed)
   NCS (NERVE CONDUCTION STUDY) WITH EMG (ELECTROMYOGRAPHY) REPORT   STUDY DATE: December 12th 2016 PATIENT NAME: Lindsay Rios DOB: 17-Dec-1965 MRN: [id number]    TECHNOLOGIST: Laretta Alstrom ELECTROMYOGRAPHER: Marcial Pacas M.D.  CLINICAL INFORMATION:  49 year old female with history of neck pain, acute onset of left fourth and fifth finger paresthesia since July 2016  On examination: She has mild left finger abduction, left fourth and fifth finger flexion weakness. Left elbow Tinel sign.  FINDINGS: NERVE CONDUCTION STUDY: Bilateral ulnar sensory and motor responses were normal. Bilateral median sensory and motor responses were normal.  NEEDLE ELECTROMYOGRAPHY: Selected needle examination was performed at left upper extremity muscles and left cervical paraspinal muscles.  Left abductor digitorum minimum: Normal insertion activity, no spontaneous activity, mild enlarged motor unit potential, with mildly decreased recruitment patterns.  Needle examination of left flexor carpi ulnaris, extensor digitorum communis, biceps, triceps, deltoid was normal.  There was no spontaneous activity at left cervical paraspinal muscles, left C5-C6 and 7.  IMPRESSION:   This is a slight abnormal study. There is evidence of slight left ulnar neuropathy demyelinating nature, most likely cross left elbow,   INTERPRETING PHYSICIAN:   Marcial Pacas M.D. Ph.D. [facility] [address], [place], [place] [address] [phone]

## 2015-01-18 ENCOUNTER — Other Ambulatory Visit: Payer: Self-pay | Admitting: Internal Medicine

## 2015-01-18 NOTE — Telephone Encounter (Signed)
Last filled 12/10/2014--please advise

## 2015-01-18 NOTE — Telephone Encounter (Signed)
Rx left in front office for pick up and pt is aware Left detailed msg on VM per HIPAA

## 2015-01-18 NOTE — Telephone Encounter (Signed)
RX printed and signed and placed in MYD box 

## 2015-01-19 ENCOUNTER — Telehealth: Payer: Self-pay

## 2015-01-19 ENCOUNTER — Other Ambulatory Visit: Payer: Self-pay | Admitting: Internal Medicine

## 2015-01-19 ENCOUNTER — Encounter: Payer: 59 | Admitting: Neurology

## 2015-01-19 MED ORDER — SIMVASTATIN 40 MG PO TABS: 40.0000 mg | ORAL_TABLET | Freq: Every day | ORAL | Status: DC

## 2015-01-19 MED ORDER — VALSARTAN-HYDROCHLOROTHIAZIDE 160-25 MG PO TABS: 1.0000 | ORAL_TABLET | Freq: Every day | ORAL | Status: DC

## 2015-01-19 MED ORDER — MELOXICAM 15 MG PO TABS: 15.0000 mg | ORAL_TABLET | Freq: Every day | ORAL | Status: DC

## 2015-01-19 NOTE — Telephone Encounter (Signed)
Pt left v/m; pt now has medicaid and [facility] told pt crestor, benicar and diclofenac are not covered. Pt request substitute meds that medicaid will pay for. Spoke with [place] at [facility] and advised to contact medicaid to see if PA needed or what med substitute might be more affordable.Pt request cb.

## 2015-01-19 NOTE — Telephone Encounter (Signed)
i looked on [place] medicaid formulary list--Preferred med tp replace Benicar is Losartan or Diovan. Preferred for Diclofenac is ibuprofen, Indocin, Meloxicam and Naproxen and preferred for Crestor is Atorvastatin, Pravastatin, Simvastatin or Lovastatin---please advise

## 2015-01-19 NOTE — Telephone Encounter (Signed)
Alternatives sent to pharmacy.

## 2015-01-20 MED ORDER — VALSARTAN-HYDROCHLOROTHIAZIDE 160-25 MG PO TABS: 1.0000 | ORAL_TABLET | Freq: Every day | ORAL | Status: DC

## 2015-01-20 NOTE — Telephone Encounter (Signed)
Amy at [facility] left v/m;valsartan HCTZ is preferred product for medicaid but does require a failure of ace inhibitor first. Can get valsartan HCTZ with appeal and office notes of why not trying ace inhibitor first. Amy request cb.

## 2015-01-20 NOTE — Telephone Encounter (Signed)
New Rx for brand name sent to pharmacy per 2016 drug formulary

## 2015-01-20 NOTE — Telephone Encounter (Signed)
Mel  Can we follow up on this. I sent in ones that you advised were alternatives.

## 2015-01-20 NOTE — Addendum Note (Signed)
Addended by: Lurlean Nanny on: 01/20/2015 12:18 PM   Modules accepted: Orders

## 2015-01-21 NOTE — Telephone Encounter (Signed)
Pt states she has been on any other medication other than Benifcar---insurance is requesting pt try and or fail an ACE inhibitor first--please advise pt only has 2 pills left

## 2015-01-22 ENCOUNTER — Other Ambulatory Visit: Payer: Self-pay | Admitting: Internal Medicine

## 2015-01-22 MED ORDER — LISINOPRIL 20 MG PO TABS: 20.0000 mg | ORAL_TABLET | Freq: Every day | ORAL | Status: DC

## 2015-01-22 MED ORDER — HYDROCHLOROTHIAZIDE 25 MG PO TABS: 25.0000 mg | ORAL_TABLET | Freq: Every day | ORAL | Status: DC

## 2015-01-22 NOTE — Telephone Encounter (Signed)
Sent Rx for Lisinopril and Rx for HCTZ to [facility]

## 2015-01-22 NOTE — Telephone Encounter (Signed)
Pt is aware.  

## 2015-01-29 ENCOUNTER — Ambulatory Visit (INDEPENDENT_AMBULATORY_CARE_PROVIDER_SITE_OTHER): Payer: 59 | Admitting: Family Medicine

## 2015-01-29 ENCOUNTER — Encounter: Payer: Self-pay | Admitting: Family Medicine

## 2015-01-29 VITALS — BP 130/80 | HR 74 | Temp 98.6°F | Ht 62.0 in | Wt 174.5 lb

## 2015-01-29 DIAGNOSIS — J069 Acute upper respiratory infection, unspecified: Secondary | ICD-10-CM | POA: Diagnosis not present

## 2015-01-29 DIAGNOSIS — B9789 Other viral agents as the cause of diseases classified elsewhere: Principal | ICD-10-CM

## 2015-01-29 MED ORDER — GUAIFENESIN-CODEINE 100-10 MG/5ML PO SYRP: 5.0000 mL | ORAL_SOLUTION | Freq: Every evening | ORAL | Status: DC | PRN

## 2015-01-29 NOTE — Patient Instructions (Signed)
Mucinex Dm during the day.  Cough suppressant prescription at night.  Call if not turning the corner in next 5-7 days. Will take likely  > 7-10 days for symptoms to resolve.

## 2015-01-29 NOTE — Assessment & Plan Note (Signed)
Symptomatic care. Cough suppressant prescription.

## 2015-01-29 NOTE — Progress Notes (Signed)
   Subjective:    Patient ID: Lindsay Rios, female    DOB: 28-Mar-1965, 49 y.o.   MRN: [id number]  Sore Throat  The current episode started in the past 7 days ( 3 days). Associated symptoms include congestion, coughing, headaches and shortness of breath. Pertinent negatives include no ear pain. She has had no exposure to strep or mono. She has tried NSAIDs for the symptoms. The treatment provided mild relief.  Cough This is a new problem. The current episode started in the past 7 days. The problem has been gradually worsening. The cough is non-productive. Associated symptoms include headaches, nasal congestion, rhinorrhea, a sore throat and shortness of breath. Pertinent negatives include no chills, ear congestion, ear pain, fever or wheezing. Associated symptoms comments: SOB when trying to sleep at night, mild. The symptoms are aggravated by lying down. Risk factors: nonsmoker. Treatments tried: nyquil. The treatment provided mild relief. There is no history of asthma, COPD, environmental allergies or pneumonia.    Social History /Family History/Past Medical History reviewed and updated if needed.   Review of Systems  Constitutional: Negative for fever and chills.  HENT: Positive for congestion, rhinorrhea and sore throat. Negative for ear pain.   Respiratory: Positive for cough and shortness of breath. Negative for wheezing.   Allergic/Immunologic: Negative for environmental allergies.  Neurological: Positive for headaches.       Objective:   Physical Exam  Constitutional: Vital signs are normal. She appears well-developed and well-nourished. She is cooperative.  Non-toxic appearance. She does not appear ill. No distress.  HENT:  Head: Normocephalic.  Right Ear: Hearing, tympanic membrane, external ear and ear canal normal. Tympanic membrane is not erythematous, not retracted and not bulging.  Left Ear: Hearing, tympanic membrane, external ear and ear canal normal. Tympanic  membrane is not erythematous, not retracted and not bulging.  Nose: Mucosal edema and rhinorrhea present. Right sinus exhibits no maxillary sinus tenderness and no frontal sinus tenderness. Left sinus exhibits no maxillary sinus tenderness and no frontal sinus tenderness.  Mouth/Throat: Uvula is midline, oropharynx is clear and moist and mucous membranes are normal.  Eyes: Conjunctivae, EOM and lids are normal. Pupils are equal, round, and reactive to light. Lids are everted and swept, no foreign bodies found.  Neck: Trachea normal and normal range of motion. Neck supple. Carotid bruit is not present. No thyroid mass and no thyromegaly present.  Cardiovascular: Normal rate, regular rhythm, S1 normal, S2 normal, normal heart sounds, intact distal pulses and normal pulses.  Exam reveals no gallop and no friction rub.   No murmur heard. Pulmonary/Chest: Effort normal and breath sounds normal. No tachypnea. No respiratory distress. She has no decreased breath sounds. She has no wheezes. She has no rhonchi. She has no rales.  Neurological: She is alert.  Skin: Skin is warm, dry and intact. No rash noted.  Psychiatric: Her speech is normal and behavior is normal. Judgment normal. Her mood appears not anxious. Cognition and memory are normal. She does not exhibit a depressed mood.          Assessment & Plan:

## 2015-01-29 NOTE — Progress Notes (Signed)
Pre visit review using our clinic review tool, if applicable. No additional management support is needed unless otherwise documented below in the visit note. 

## 2015-02-11 ENCOUNTER — Ambulatory Visit (INDEPENDENT_AMBULATORY_CARE_PROVIDER_SITE_OTHER): Payer: Medicaid Other | Admitting: Internal Medicine

## 2015-02-11 ENCOUNTER — Encounter: Payer: Self-pay | Admitting: Internal Medicine

## 2015-02-11 VITALS — BP 122/80 | HR 108 | Temp 98.2°F | Wt 176.0 lb

## 2015-02-11 DIAGNOSIS — R159 Full incontinence of feces: Secondary | ICD-10-CM

## 2015-02-11 DIAGNOSIS — R152 Fecal urgency: Secondary | ICD-10-CM

## 2015-02-11 NOTE — Patient Instructions (Signed)
Fecal Incontinence Fecal incontinence, also called accidental bowel leakage, is not being able to control your bowels. This condition happens because the nerves or muscles around the anus do not work the way they should. This affects their ability to hold stool. CAUSES  This condition may be caused by:  Damage to the muscles at the end of the rectum (sphincter).  Damage to the nerves that control bowel movements.  Diarrhea.  Chronic constipation.  Pelvic floor dysfunction. This means the muscles in the pelvis do not work well.  Loss of bowel storage capacity. RISK FACTORS This condition is more likely to develop in people who:   Are born with bowels or a pelvis that has not formed correctly.  Have had rectal surgery.  Have had radiation treatment for certain cancers.  Have irritable bowel syndrome (IBS).  Have an inflammatory bowel disease (IBD), such as Crohn disease.  Have been pregnant, had a vaginal delivery, or had surgery that damaged the pelvic floor muscles.  Have a complicated childbirth, spinal cord injury, or other trauma that causes nerve damage.  Have a condition that can affect nerve function, such as diabetes, Parkinson disease, or multiple sclerosis.  Have a condition where the rectum drops down into the anus or vagina (prolapse).  Are older. SYMPTOMS  The main symptom of this condition is not being able to control your bowels. You also might not be able get to the bathroom before a bowel movement. DIAGNOSIS  This condition is diagnosed with a medical history and physical exam. You may also have tests, including:   Blood tests.  Urine tests.  A rectal exam.  Ultrasound.  MRI.  Colonoscopy. This is an exam that looks at your large intestine (colon).  Anal manometry. This is a test that measures the strength of the anal sphincter.  Anal electromyogram (EMG). This is a test that uses small electrodes to check for nerve damage. TREATMENT    Treatment varies depending on the cause and severity of your condition. Treatment may also focus on addressing any underlying causes of this condition. Treatment may include:  Medicines. This may include medicines to:  Prevent diarrhea.  Help with constipation (laxatives).  Treat any underlying conditions.  Physical therapy.  Fiber supplements. These can help manage your bowel movements.  Nerve stimulation.  Injectable gel to promote tissue growth and better muscle control.  Surgery. You may need:  Sphincter repair surgery.  Diversion surgery. This procedure lets feces pass out of your body through a hole in your abdomen. HOME CARE INSTRUCTIONS  Diet  Follow instructions from your health care provider about any eating or drinking restrictions. Work with a dietitian to come up with a healthy diet and to help you avoid the foods that can make your condition worse. Keep a diet diary to find out which foods or drinks could be making your fecal incontinence worse.  Drink enough fluid to keep your urine clear or pale yellow. Lifestyle  If you smoke, talk to your health care provider about quitting. This may help your condition.  If you are overweight, talk to your health care provider about how to safely lose weight. This may help your condition.  Increase your physical activity as told by your health care provider. This may help your condition. Always talk to your health care provider before starting a new exercise program.  Carry a change of clothes and supplies to clean up quickly if you have an episode of fetal incontinence.  Consider joining a  fecal incontinence support group. You can find a support group online or in your local community. General Instructions  Take over-the-counter and prescription medicines only as told by your health care provider. This includes any supplements.  Apply a moisture barrier, such as petroleum jelly, to your rectum. This protects the skin  from irritation caused by ongoing leaking or diarrhea.  Tell your health care provider if you are upset or depressed about your condition.  SEEK MEDICAL CARE IF:   You have a fever.  You have redness, swelling, or pain around your rectum.  Your pain is getting worse or you lose feeling in your rectal area.  You have blood in your stool.  You feel sad or hopeless.  You avoid social or work situations. SEEK IMMEDIATE MEDICAL CARE IF:   You stop having bowel movements.  You cannot eat or drink without vomiting.  You have rectal bleeding that does not stop.  You have severe pain that is getting worse.  You have symptoms of dehydration, including:  Sleepiness or fatigue.  Producing little or no urine, tears, or sweat.  Dizziness.  Dry mouth.  Unusual irritability.  Headache.  Inability to think clearly. FOR MORE INFORMATION  American Academy of Family Physicians: www.AromatherapyParty.no International Foundation for Functional Gastrointestinal Disorders: www.iffgd.org   This information is not intended to replace advice given to you by your health care provider. Make sure you discuss any questions you have with your health care provider.   Document Released: 12/29/2003 Document Revised: 10/07/2014 Document Reviewed: 06/24/2014 Elsevier Interactive Patient Education [employer].

## 2015-02-11 NOTE — Progress Notes (Signed)
Subjective:    Patient ID: Lindsay Rios, female    DOB: [birth year], 50 y.o.   MRN: [id number]  HPI   Pt presents to the clinic today with c/o fecal urgency. She sometimes will have associated  bowel incontinence. She denies bloating or abdominal pain. She denies nausea, vomiting and reflux. She denies diarrhea or constipation. She has not really tried anything OTC. She is not taking any stool softeners or laxatives OTC. She had a colonoscopy several years ago, which was normal by her report. She does have a family history of colon cancer. She is requesting a referral to a GI doctor for further evaluation.  Review of Systems      Past Medical History  Diagnosis Date  . Hypertension   . Depression   . Hyperlipidemia   . ADD (attention deficit disorder)   . Osteopenia   . 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 Pneumonia 2012  . Gestational diabetes 1999; 2002  . Type II diabetes mellitus ([place])     "borderline; I'm on Metformin"  . Ejection fraction   . History of chicken pox   . Allergy   . Numbness and tingling     Current Outpatient Prescriptions  Medication Sig Dispense Refill  . aspirin 81 MG chewable tablet Chew 81 mg by mouth daily.     Marland Kitchen FLUoxetine (PROZAC) 40 MG capsule Take 1 capsule (40 mg total) by mouth daily. 30 capsule 5  . hydrochlorothiazide (HYDRODIURIL) 25 MG tablet Take 1 tablet (25 mg total) by mouth daily. 30 tablet 2  . lisinopril (PRINIVIL,ZESTRIL) 20 MG tablet Take 1 tablet (20 mg total) by mouth daily. 30 tablet 2  . meloxicam (MOBIC) 15 MG tablet Take 1 tablet (15 mg total) by mouth daily. 30 tablet 2  . nitroGLYCERIN (NITROSTAT) 0.4 MG SL tablet Place 1 tablet (0.4 mg total) under the tongue every 5 (five) minutes as needed for chest pain. 30 tablet 12  . simvastatin (ZOCOR) 40 MG tablet Take 1 tablet (40 mg total) by mouth at bedtime. 30 tablet 2  . TANZEUM 50 MG PEN Inject 1 each as directed once a week. 4 each 5  . VYVANSE 10 MG CAPS  TAKE ONE CAPSULE BY MOUTH DAILY 30 capsule 0   No current facility-administered medications for this visit.    Allergies  Allergen Reactions  . Sulfa Antibiotics     REACTION: nausea    Family History  Problem Relation Age of Onset  . Hyperlipidemia Mother   . Hyperlipidemia Father   . Hypertension Father   . Diabetes Father   . Heart disease Father   . Stroke Father   . Cancer Maternal Grandmother     Colon Cancer  . Alzheimer's disease Maternal Grandmother   . Heart disease Brother 53    Myocardial Infarction    Social History   Social History  . Marital Status: Divorced    Spouse Name: [name] (Separated)   . Number of Children: 2  . Years of Education: 14   Occupational History  . 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 Smoking status: Never Smoker   . Smokeless tobacco: Never Used  . Alcohol Use: No     Comment: 12/19/2013 "might have a drink a couple times/year"  . Drug Use: No  . Sexual Activity:    Partners: Male   Other  Topics Concern  . Not on file   Social History Narrative   Marital Status: Separated [name]) Significant Other [name])    Children:  Son (1) Daughter (1)    Pets: Dogs (2), Cat (1)    Living Situation: Lives with children.   Occupation: Full- Time Student    Education: [employer] [place]    Tobacco Use: She has never smoked.     Alcohol Use:  Rarely   Drug Use:  None   Diet:  Regular   Exercise:  None   Hobbies: Shopping           Constitutional: Denies fever, malaise, fatigue, headache or abrupt weight changes.  Respiratory: Denies difficulty breathing, shortness of breath, cough or sputum production.   Cardiovascular: Denies chest pain, chest tightness, palpitations or swelling in the hands or feet.  Gastrointestinal: Denies abdominal pain, bloating, constipation, diarrhea or blood in the stool.    No other specific complaints in a complete review of systems (except as  listed in HPI above).  Objective:   Physical Exam   BP 122/80 mmHg  Pulse 108  Temp(Src) 98.2 F (36.8 C) (Oral)  Wt 176 lb (79.833 kg)  SpO2 97% Wt Readings from Last 3 Encounters:  02/11/15 176 lb (79.833 kg)  01/29/15 174 lb 8 oz (79.153 kg)  01/04/15 168 lb (76.204 kg)    General: Appears her stated age, obese in NAD.Marland Kitchen  Cardiovascular: Normal rate and rhythm. S1,S2 noted.  No murmur, rubs or gallops noted.  Pulmonary/Chest: Normal effort and positive vesicular breath sounds. No respiratory distress. No wheezes, rales or ronchi noted.  Abdomen: Soft and nontender. Normal bowel sounds. Rectal: Normal external anatomy. Normal rectal tone. No internal mass noted.  BMET    Component Value Date/Time   NA 139 12/10/2014 1016   NA 140 08/31/2011   K 3.7 12/10/2014 1016   CL 98 12/10/2014 1016   CO2 32 12/10/2014 1016   GLUCOSE 118* 12/10/2014 1016   BUN 20 12/10/2014 1016   BUN 17 08/31/2011   CREATININE 0.90 12/10/2014 1016   CREATININE 0.79 08/04/2013 0955   CREATININE 0.8 08/31/2011   CALCIUM 10.4 12/10/2014 1016   GFRNONAA >60 08/27/2014 1153   GFRNONAA >89 08/04/2013 0955   GFRAA >60 08/27/2014 1153   GFRAA >89 08/04/2013 0955    Lipid Panel     Component Value Date/Time   CHOL 199 12/10/2014 1016   CHOL 132 03/26/2013 1138   TRIG 139.0 12/10/2014 1016   TRIG 135 03/26/2013 1138   HDL 49.30 12/10/2014 1016   HDL 47 03/26/2013 1138   CHOLHDL 4 12/10/2014 1016   VLDL 27.8 12/10/2014 1016   LDLCALC 122* 12/10/2014 1016   LDLCALC 58 03/26/2013 1138    CBC    Component Value Date/Time   WBC 9.4 08/27/2014 1153   WBC 10.0 08/31/2011   RBC 5.04 08/27/2014 1153   HGB 14.6 08/27/2014 1153   HCT 42.8 08/27/2014 1153   PLT 228 08/27/2014 1153   MCV 84.9 08/27/2014 1153   MCH 29.0 08/27/2014 1153   MCHC 34.1 08/27/2014 1153   RDW 12.7 08/27/2014 1153   LYMPHSABS 2.3 08/27/2014 1153   MONOABS 0.6 08/27/2014 1153   EOSABS 0.2 08/27/2014 1153   BASOSABS  0.1 08/27/2014 1153    Hgb A1C Lab Results  Component Value Date   HGBA1C 6.0 12/10/2014        Assessment & Plan:   Fecal urgency with bowel incontinence:  Referral placed to  GI per her request for further evaluation  RTC as needed

## 2015-02-11 NOTE — Progress Notes (Signed)
Pre visit review using our clinic review tool, if applicable. No additional management support is needed unless otherwise documented below in the visit note. 

## 2015-02-25 ENCOUNTER — Other Ambulatory Visit: Payer: Self-pay | Admitting: Internal Medicine

## 2015-02-26 ENCOUNTER — Other Ambulatory Visit: Payer: Self-pay | Admitting: Internal Medicine

## 2015-02-26 NOTE — Telephone Encounter (Signed)
Printed and in Kim's box 

## 2015-02-26 NOTE — Telephone Encounter (Signed)
Rx left in front office for pick up and pt is aware  

## 2015-02-26 NOTE — Telephone Encounter (Signed)
Lindsay Rios pt, R last filled 01/18/2015--please advise

## 2015-03-11 ENCOUNTER — Encounter: Payer: Self-pay | Admitting: Gastroenterology

## 2015-03-11 ENCOUNTER — Ambulatory Visit (INDEPENDENT_AMBULATORY_CARE_PROVIDER_SITE_OTHER): Payer: Medicaid Other | Admitting: Gastroenterology

## 2015-03-11 VITALS — BP 126/70 | HR 62 | Ht 62.0 in | Wt 174.0 lb

## 2015-03-11 DIAGNOSIS — R159 Full incontinence of feces: Secondary | ICD-10-CM | POA: Diagnosis not present

## 2015-03-11 NOTE — Patient Instructions (Addendum)
You have been scheduled to have an anorectal manometry at [facility] on Wednesday 03/17/15 at 8:30 am. Please arrive 30 minutes prior to your appointment time for registration (1st floor of the hospital-admissions).  Please make certain to use 1 Fleets enema 2 hours prior to coming for your appointment. You can purchase Fleets enemas from the laxative section at your drug store. You should not eat anything during the two hours prior to the procedure. You may take regular medications with small sips of water at least 2 hours prior to the study.  Anorectal manometry is a test performed to evaluate patients with constipation or fecal incontinence. This test measures the pressures of the anal sphincter muscles, the sensation in the rectum, and the neural reflexes that are needed for normal bowel movements.  THE PROCEDURE The test takes approximately 30 minutes to 1 hour. You will be asked to change into a hospital gown. A technician or nurse will explain the procedure to you, take a brief health history, and answer any questions you may have. The patient then lies on his or her left side. A small, flexible tube, about the size of a thermometer, with a balloon at the end is inserted into the rectum. The catheter is connected to a machine that measures the pressure. During the test, the small balloon attached to the catheter may be inflated in the rectum to assess the normal reflex pathways. The nurse or technician may also ask the person to squeeze, relax, and push at various times. The anal sphincter muscle pressures are measured during each of these maneuvers. To squeeze, the patient tightens the sphincter muscles as if trying to prevent anything from coming out. To push or bear down, the patient strains down as if trying to have a bowel movement. ________________________________________________________________________________________________________  We have given you a Kegel Exercise pamphlet to  look over and follow.

## 2015-03-11 NOTE — Progress Notes (Signed)
    History of Present Illness: This is a 50 year old female referred by Lindsay Fenton, NP for the evaluation of fecal incontinence. He underwent colonoscopy by Dr. Earlean Shawl 2 years ago. His records are not available. She reports the colonoscopy was normal. She did not return to Dr. Earlean Shawl as he does not accept her insurance coverage. Symptoms intermittent since childbirth 17 years ago however she has increasing frequency of episodes over the past 2 years. Occasional looser stool but mainly formed and normal bowel movements. Denies weight loss, abdominal pain, constipation, change in stool caliber, melena, hematochezia, nausea, vomiting, dysphagia, reflux symptoms, chest pain.  Review of Systems: Pertinent positive and negative review of systems were noted in the above HPI section. All other review of systems were otherwise negative.  Current Medications, Allergies, Past Medical History, Past Surgical History, Family History and Social History were reviewed in [employer] record.  Physical Exam: General: Well developed, well nourished, no acute distress Head: Normocephalic and atraumatic Eyes:  sclerae anicteric, EOMI Ears: Normal auditory acuity Mouth: No deformity or lesions Neck: Supple, no masses or thyromegaly Lungs: Clear throughout to auscultation Heart: Regular rate and rhythm; no murmurs, rubs or bruits Abdomen: Soft, non tender and non distended. No masses, hepatosplenomegaly or hernias noted. Normal Bowel sounds Rectal: slightly decreased sphincter tone, decreased anal squeeze Musculoskeletal: Symmetrical with no gross deformities  Skin: No lesions on visible extremities Pulses:  Normal pulses noted Extremities: No clubbing, cyanosis, edema or deformities noted Neurological: Alert oriented x 4, grossly nonfocal Cervical Nodes:  No significant cervical adenopathy Inguinal Nodes: No significant inguinal adenopathy Psychological:  Alert and cooperative. Normal  mood and affect  Assessment and Recommendations:  1. Fecal incontinence. Slightly decreased sphincter tone and decreased anal squeeze. Schedule anorectal manometry with Dr. Silverio Decamp for further evaluation. Begin Kegel exercises. Imodium twice a day when necessary diarrhea and loose stools. Request records from prior colonoscopy by Dr. Earlean Shawl.  cc: Lindsay Fenton, NP [address] [place], [place] [address]

## 2015-03-17 ENCOUNTER — Encounter (HOSPITAL_COMMUNITY)
Admission: RE | Disposition: A | Discharge status: Home / Self Care | Payer: Self-pay | Source: Ambulatory Visit | Attending: Gastroenterology

## 2015-03-17 ENCOUNTER — Ambulatory Visit (HOSPITAL_COMMUNITY)
Admission: RE | Admit: 2015-03-17 | Discharge: 2015-03-17 | Disposition: A | Discharge status: Home / Self Care | Payer: Medicaid Other | Source: Ambulatory Visit | Attending: Gastroenterology | Admitting: Gastroenterology

## 2015-03-17 DIAGNOSIS — R159 Full incontinence of feces: Secondary | ICD-10-CM | POA: Insufficient documentation

## 2015-03-17 DIAGNOSIS — K5900 Constipation, unspecified: Secondary | ICD-10-CM | POA: Diagnosis not present

## 2015-03-17 HISTORY — PX: ANAL RECTAL MANOMETRY: SHX6358

## 2015-03-17 SURGERY — MANOMETRY, ANORECTAL

## 2015-03-18 ENCOUNTER — Encounter (HOSPITAL_COMMUNITY): Payer: Self-pay | Admitting: Gastroenterology

## 2015-03-31 ENCOUNTER — Telehealth: Payer: Self-pay | Admitting: Gastroenterology

## 2015-03-31 DIAGNOSIS — M6289 Other specified disorders of muscle: Secondary | ICD-10-CM

## 2015-03-31 DIAGNOSIS — K5900 Constipation, unspecified: Secondary | ICD-10-CM | POA: Insufficient documentation

## 2015-03-31 NOTE — Telephone Encounter (Signed)
Dr. Silverio Decamp, have you had a chance to read this mano?  I do not see it scanned into EPIC

## 2015-03-31 NOTE — Telephone Encounter (Signed)
She has findings of pelvic floor dysfunction and weak external anal sphincter. Will need referral to pelvic floor PT to improve sphincter tone and also dyssynergia defecation

## 2015-04-01 ENCOUNTER — Ambulatory Visit: Payer: Medicaid Other | Admitting: Internal Medicine

## 2015-04-01 NOTE — Telephone Encounter (Signed)
Patient notified of the appt date and time.  I mailed her a letter also with the details.  She is scheduled for 04/08/15 2:30 arrival for 2:45 with Earlie Counts at the [facility]

## 2015-04-08 ENCOUNTER — Encounter: Payer: Self-pay | Admitting: Internal Medicine

## 2015-04-08 ENCOUNTER — Ambulatory Visit: Payer: Medicaid Other | Attending: Gastroenterology | Admitting: Physical Therapy

## 2015-04-08 ENCOUNTER — Encounter: Payer: Self-pay | Admitting: Physical Therapy

## 2015-04-08 ENCOUNTER — Ambulatory Visit (INDEPENDENT_AMBULATORY_CARE_PROVIDER_SITE_OTHER): Payer: Medicaid Other | Admitting: Internal Medicine

## 2015-04-08 VITALS — BP 132/86 | HR 92 | Temp 98.9°F | Wt 175.0 lb

## 2015-04-08 DIAGNOSIS — N8184 Pelvic muscle wasting: Secondary | ICD-10-CM | POA: Diagnosis present

## 2015-04-08 DIAGNOSIS — G4719 Other hypersomnia: Secondary | ICD-10-CM | POA: Diagnosis not present

## 2015-04-08 DIAGNOSIS — E559 Vitamin D deficiency, unspecified: Secondary | ICD-10-CM | POA: Diagnosis not present

## 2015-04-08 DIAGNOSIS — R635 Abnormal weight gain: Secondary | ICD-10-CM

## 2015-04-08 DIAGNOSIS — R0683 Snoring: Secondary | ICD-10-CM | POA: Diagnosis not present

## 2015-04-08 DIAGNOSIS — I10 Essential (primary) hypertension: Secondary | ICD-10-CM | POA: Diagnosis not present

## 2015-04-08 DIAGNOSIS — N907 Vulvar cyst: Secondary | ICD-10-CM | POA: Insufficient documentation

## 2015-04-08 DIAGNOSIS — N8189 Other female genital prolapse: Secondary | ICD-10-CM

## 2015-04-08 DIAGNOSIS — M6289 Other specified disorders of muscle: Secondary | ICD-10-CM

## 2015-04-08 LAB — VITAMIN D 25 HYDROXY (VIT D DEFICIENCY, FRACTURES): VITD: 21.38 ng/mL — ABNORMAL LOW (ref 30.00–100.00)

## 2015-04-08 NOTE — Patient Instructions (Signed)
Sleep Apnea  Sleep apnea is a sleep disorder characterized by abnormal pauses in breathing while you sleep. When your breathing pauses, the level of oxygen in your blood decreases. This causes you to move out of deep sleep and into light sleep. As a result, your quality of sleep is poor, and the system that carries your blood throughout your body (cardiovascular system) experiences stress. If sleep apnea remains untreated, the following conditions can develop:  High blood pressure (hypertension).  Coronary artery disease.  Inability to achieve or maintain an erection (impotence).  Impairment of your thought process (cognitive dysfunction). There are three types of sleep apnea: 1. Obstructive sleep apnea--Pauses in breathing during sleep because of a blocked airway. 2. Central sleep apnea--Pauses in breathing during sleep because the area of the brain that controls your breathing does not send the correct signals to the muscles that control breathing. 3. Mixed sleep apnea--A combination of both obstructive and central sleep apnea. RISK FACTORS The following risk factors can increase your risk of developing sleep apnea:  Being overweight.  Smoking.  Having narrow passages in your nose and throat.  Being of older age.  Being female.  Alcohol use.  Sedative and tranquilizer use.  Ethnicity. Among individuals younger than 35 years, African Americans are at increased risk of sleep apnea. SYMPTOMS   Difficulty staying asleep.  Daytime sleepiness and fatigue.  Loss of energy.  Irritability.  Loud, heavy snoring.  Morning headaches.  Trouble concentrating.  Forgetfulness.  Decreased interest in sex.  Unexplained sleepiness. DIAGNOSIS  In order to diagnose sleep apnea, your caregiver will perform a physical examination. A sleep study done in the comfort of your own home may be appropriate if you are otherwise healthy. Your caregiver may also recommend that you spend the  night in a sleep lab. In the sleep lab, several monitors record information about your heart, lungs, and brain while you sleep. Your leg and arm movements and blood oxygen level are also recorded. TREATMENT The following actions may help to resolve mild sleep apnea:  Sleeping on your side.   Using a decongestant if you have nasal congestion.   Avoiding the use of depressants, including alcohol, sedatives, and narcotics.   Losing weight and modifying your diet if you are overweight. There also are devices and treatments to help open your airway:  Oral appliances. These are custom-made mouthpieces that shift your lower jaw forward and slightly open your bite. This opens your airway.  Devices that create positive airway pressure. This positive pressure "splints" your airway open to help you breathe better during sleep. The following devices create positive airway pressure:  Continuous positive airway pressure (CPAP) device. The CPAP device creates a continuous level of air pressure with an air pump. The air is delivered to your airway through a mask while you sleep. This continuous pressure keeps your airway open.  Nasal expiratory positive airway pressure (EPAP) device. The EPAP device creates positive air pressure as you exhale. The device consists of single-use valves, which are inserted into each nostril and held in place by adhesive. The valves create very little resistance when you inhale but create much more resistance when you exhale. That increased resistance creates the positive airway pressure. This positive pressure while you exhale keeps your airway open, making it easier to breath when you inhale again.  Bilevel positive airway pressure (BPAP) device. The BPAP device is used mainly in patients with central sleep apnea. This device is similar to the CPAP device because   it also uses an air pump to deliver continuous air pressure through a mask. However, with the BPAP machine, the  pressure is set at two different levels. The pressure when you exhale is lower than the pressure when you inhale.  Surgery. Typically, surgery is only done if you cannot comply with less invasive treatments or if the less invasive treatments do not improve your condition. Surgery involves removing excess tissue in your airway to create a wider passage way.   This information is not intended to replace advice given to you by your health care provider. Make sure you discuss any questions you have with your health care provider.   Document Released: 01/06/2002 Document Revised: 02/06/2014 Document Reviewed: 05/25/2011 Elsevier Interactive Patient Education 2016 Elsevier Inc.   

## 2015-04-08 NOTE — Progress Notes (Signed)
Subjective:    Patient ID: Lindsay Rios, female    DOB: 11-05-1965, 50 y.o.   MRN: [id number]  HPI  Pt presents to the clinic today requesting a referral for a sleep study. She feels like a lot of her issues may be related to sleep apnea. She started noticing this when she was pregnant, but it has gotten worse over the las t 6 months. She snores heavily, even to the point where it wakes her up at night. She lives alone, so she has not had any witnessed episodes. she is extremely fatigued and sleepy during the day, even falling asleep while sitting at a stop light at times. She has gained 9 lbs in the last 6 months and does think that this may be a contributing factor. She has never been told that she has had sleep apnea.  Of note, she is due to have her Vit D level rechecked. It was 18. She took 12 weeks of Ergocalciferol. She has not been taking any OTC Vit D.  Review of Systems      Past Medical History  Diagnosis Date  . Hypertension   . Depression   . Hyperlipidemia   . ADD (attention deficit disorder)   . Osteopenia   . 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 Pneumonia 2012  . Gestational diabetes 1999; 2002  . Type II diabetes mellitus ([place])     "borderline; I'm on Metformin"  . Ejection fraction   . History of chicken pox   . Allergy   . Numbness and tingling     Current Outpatient Prescriptions  Medication Sig Dispense Refill  . aspirin 81 MG chewable tablet Chew 81 mg by mouth daily.     Marland Kitchen FLUoxetine (PROZAC) 40 MG capsule Take 1 capsule (40 mg total) by mouth daily. 30 capsule 5  . hydrochlorothiazide (HYDRODIURIL) 25 MG tablet Take 1 tablet (25 mg total) by mouth daily. 30 tablet 2  . lisinopril (PRINIVIL,ZESTRIL) 20 MG tablet Take 1 tablet (20 mg total) by mouth daily. 30 tablet 2  . meloxicam (MOBIC) 15 MG tablet Take 1 tablet (15 mg total) by mouth daily. 30 tablet 2  . nitroGLYCERIN (NITROSTAT) 0.4 MG SL tablet Place 1 tablet (0.4 mg total) under the  tongue every 5 (five) minutes as needed for chest pain. 30 tablet 12  . simvastatin (ZOCOR) 40 MG tablet Take 1 tablet (40 mg total) by mouth at bedtime. 30 tablet 2  . TANZEUM 50 MG PEN Inject 1 each as directed once a week. 4 each 5  . VYVANSE 10 MG CAPS TAKE ONE CAPSULE BY MOUTH DAILY 30 capsule 0   No current facility-administered medications for this visit.    Allergies  Allergen Reactions  . Sulfa Antibiotics     REACTION: nausea    Family History  Problem Relation Age of Onset  . Hyperlipidemia Mother   . Hyperlipidemia Father   . Hypertension Father   . Diabetes Father   . Heart disease Father   . Stroke Father   . Cancer Maternal Grandmother     Colon Cancer  . Alzheimer's disease Maternal Grandmother   . Heart disease Brother 62    Myocardial Infarction    Social History   Social History  . Marital Status: Divorced    Spouse Name: [name] (Separated)   . Number of Children: 2  . Years of Education: 14   Occupational History  . 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 Smoking status: Never Smoker   . Smokeless tobacco: Never Used  . Alcohol Use: No     Comment: 12/19/2013 "might have a drink a couple times/year"  . Drug Use: No  . Sexual Activity:    Partners: Male   Other Topics Concern  . Not on file   Social History Narrative   Marital Status: Separated [name]) Significant Other [name])    Children:  Son (1) Daughter (1)    Pets: Dogs (2), Cat (1)    Living Situation: Lives with children.   Occupation: Full- Time Student    Education: [employer] [place]    Tobacco Use: She has never smoked.     Alcohol Use:  Rarely   Drug Use:  None   Diet:  Regular   Exercise:  None   Hobbies: Shopping           Constitutional: Pt reports fatigue and weight gain. Denies fever, malaise, headache.  HEENT: Denies eye pain, eye redness, ear pain, ringing in the ears, wax buildup, runny nose, nasal  congestion, bloody nose, or sore throat. Respiratory: Denies difficulty breathing, shortness of breath, cough or sputum production.   Cardiovascular: Denies chest pain, chest tightness, palpitations or swelling in the hands or feet.   Neurological: Pt reports daytime fatigue. Denies dizziness, difficulty with memory, difficulty with speech or problems with balance and coordination.  Psych: Pt reports stress and depressoin. Denies anxiety, SI/HI.  No other specific complaints in a complete review of systems (except as listed in HPI above).  Objective:   Physical Exam   BP 132/86 mmHg  Pulse 92  Temp(Src) 98.9 F (37.2 C) (Oral)  Wt 175 lb (79.379 kg)  SpO2 98% Wt Readings from Last 3 Encounters:  04/08/15 175 lb (79.379 kg)  03/11/15 174 lb (78.926 kg)  02/11/15 176 lb (79.833 kg)    General: Appears her stated age, obese in NAD. Neck:  14.5 inches Cardiovascular: Normal rate and rhythm. S1,S2 noted.  No murmur, rubs or gallops noted.  Pulmonary/Chest: Normal effort and positive vesicular breath sounds. No respiratory distress. No wheezes, rales or ronchi noted.  Neurological: Alert and oriented.  Psychiatric: She is tearful at times during the interview.  BMET    Component Value Date/Time   NA 139 12/10/2014 1016   NA 140 08/31/2011   K 3.7 12/10/2014 1016   CL 98 12/10/2014 1016   CO2 32 12/10/2014 1016   GLUCOSE 118* 12/10/2014 1016   BUN 20 12/10/2014 1016   BUN 17 08/31/2011   CREATININE 0.90 12/10/2014 1016   CREATININE 0.79 08/04/2013 0955   CREATININE 0.8 08/31/2011   CALCIUM 10.4 12/10/2014 1016   GFRNONAA >60 08/27/2014 1153   GFRNONAA >89 08/04/2013 0955   GFRAA >60 08/27/2014 1153   GFRAA >89 08/04/2013 0955    Lipid Panel     Component Value Date/Time   CHOL 199 12/10/2014 1016   CHOL 132 03/26/2013 1138   TRIG 139.0 12/10/2014 1016   TRIG 135 03/26/2013 1138   HDL 49.30 12/10/2014 1016   HDL 47 03/26/2013 1138   CHOLHDL 4 12/10/2014 1016    VLDL 27.8 12/10/2014 1016   LDLCALC 122* 12/10/2014 1016   LDLCALC 58 03/26/2013 1138    CBC    Component Value Date/Time   WBC 9.4 08/27/2014 1153   WBC 10.0 08/31/2011   RBC 5.04 08/27/2014 1153   HGB  14.6 08/27/2014 1153   HCT 42.8 08/27/2014 1153   PLT 228 08/27/2014 1153   MCV 84.9 08/27/2014 1153   MCH 29.0 08/27/2014 1153   MCHC 34.1 08/27/2014 1153   RDW 12.7 08/27/2014 1153   LYMPHSABS 2.3 08/27/2014 1153   MONOABS 0.6 08/27/2014 1153   EOSABS 0.2 08/27/2014 1153   BASOSABS 0.1 08/27/2014 1153    Hgb A1C Lab Results  Component Value Date   HGBA1C 6.0 12/10/2014        Assessment & Plan:   Abnormal weight gain, excessive daytime sleepiness, snoring:  Scored 24 on Epworth Sleepiness Scale, scanned into chart Will refer to pulmonology for sleep study  Vit D deficiency:  Will check Vit D today

## 2015-04-08 NOTE — Therapy (Signed)
[facility] [address], [place], [place], [address] Phone: [phone]   Fax:  [phone]  Physical Therapy Evaluation  Patient Details  Name: Lindsay Rios MRN: [id number] Date of Birth: 1965/10/27 Referring Provider: Dr. Lucio Edward  Encounter Date: 04/08/2015      PT End of Session - 04/08/15 1519    Visit Number 1   Date for PT Re-Evaluation 10/09/15   Authorization Type Medicaid   PT Start Time 1445   PT Stop Time 1520   PT Time Calculation (min) 35 min   Activity Tolerance Patient tolerated treatment well   Behavior During Therapy [facility] for tasks assessed/performed      Past Medical History  Diagnosis Date  . Hypertension   . Depression   . Hyperlipidemia   . ADD (attention deficit disorder)   . Osteopenia   . 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 Pneumonia 2012  . Gestational diabetes 1999; 2002  . Type II diabetes mellitus ([place])     "borderline; I'm on Metformin"  . Ejection fraction   . History of chicken pox   . Allergy   . Numbness and tingling     Past Surgical History  Procedure Laterality Date  . Shoulder arthroscopy w/ rotator cuff repair Right 1990's  . Vaginal hysterectomy  2013    HPV/Cervical Dysplasia; partial  . Left heart catheterization with coronary angiogram N/A 12/22/2013    Procedure: LEFT HEART CATHETERIZATION WITH CORONARY ANGIOGRAM;  Surgeon: Jettie Booze, MD;  Location: [facility] CATH LAB;  Service: Cardiovascular;  Laterality: N/A;  . Anal rectal manometry N/A 03/17/2015    Procedure: ANO RECTAL MANOMETRY;  Surgeon: Mauri Pole, MD;  Location: [facility] ENDOSCOPY;  Service: Endoscopy;  Laterality: N/A;    There were no vitals filed for this visit.  Visit Diagnosis:  Pelvic floor dysfunction - Plan: PT plan of care cert/re-cert  Perineal floor weakness - Plan: PT plan of care cert/re-cert      Subjective Assessment - 04/08/15 1453    Subjective 17  years ago she tore when having her son and have accidents.  This improved over time. Last several years patient has had fecal leakage gotten worse.  Patient reports  she will be in the grocery store and get the urge to have a bowel  movement and have an accident.  Patient has thrown out many underwear.  Patient reports  during the study  she is unablle to hold it in.    Patient Stated Goals reduce fecal leakage   Currently in Pain? No/denies            [facility] PT Assessment - 04/08/15 0001    Assessment   Medical Diagnosis N81.84 pelvic floor dysfunction   Referring Provider Dr. Lucio Edward   Prior Therapy None   Precautions   Precautions None   Balance Screen   Has the patient fallen in the past 6 months No   Has the patient had a decrease in activity level because of a fear of falling?  No   Is the patient reluctant to leave their home because of a fear of falling?  No   Prior Function   Level of Independence Independent   Vocation Unemployed   ROM / Strength   AROM / PROM / Strength Strength   Strength   Overall Strength Comments abdominal strength is 2/5 and right hip abduction 3/5  Pelvic Floor Special Questions - 04/08/15 0001    Prior Pregnancies Yes   Number of Pregnancies 2   Number of Vaginal Deliveries 2   Episiotomy Performed No  bad tear   Fecal incontinence Yes  urgency   Skin Integrity Intact   Pelvic Floor Internal Exam Patient confirmed identitfication and approved physical therapy to assess muscle integrity and strength   Exam Type Rectal   Palpation thickness located on anterior rectal wall   Strength weak squeeze, no lift   Strength # of reps 3   Strength # of seconds 10                  PT Education - 04/08/15 1519    Education provided Yes   Education Details pelvic floor contraction, hip abduction, priiformis stretch   Person(s) Educated Patient   Methods Explanation;Demonstration;Verbal cues;Handout    Comprehension Returned demonstration;Verbalized understanding          PT Short Term Goals - 04/08/15 1524    PT SHORT TERM GOAL #1   Title independent with initial HeP   Time 4   Period Weeks   Status New   PT SHORT TERM GOAL #2   Title understand how to contract the anus without contracting the gluteals for 10 times   Time 4   Period Weeks   Status New   PT SHORT TERM GOAL #3   Title fecal leakage decreased >/= 25%   Time 4   Period Weeks   Status New   PT SHORT TERM GOAL #4   Title urgency to have a bowel movement improved >/= 25%   Time 4   Period Weeks   Status New           PT Long Term Goals - 04/08/15 1525    PT LONG TERM GOAL #1   Title independent with HEP and understand how to progress herself   Time 6   Period Months   Status New   PT LONG TERM GOAL #2   Title fecal leakage decreased >/= 75%   Time 6   Period Months   Status New   PT LONG TERM GOAL #3   Title urgency to have a bowel movement decresaed >/= 75%   Time 6   Period Months   Status New   PT LONG TERM GOAL #4   Title pelvic floor strength >/= 4/5   Time 6   Period Months   Status New               Plan - 04/08/15 1520    Clinical Impression Statement Patient is a 50 year old female with diagnosis of pelvic floor dysfunction for 17 years.  Patient reports in the past several years it hs been worse.  Patient reports when she had her vaginal delivery she tore and started fecal leakage.  patient reports no pain.  Pelvic floor strengt his 2/5 holding for 10 sec. 2 times.  Patient will tighten her buttocks instead of pelvic floor.  right hip abduction 3/5 and abdominal strength is 2/5.  Patient will have fecal leakage when she has the urge and unable to control gas.  Patient is not able to hold her bowel movements. Patient hs Medicaid so she is going to come 1 time per month for her exercise program to be adjusted.    Pt will benefit from skilled therapeutic intervention in order to  improve on the following deficits Decreased endurance;Decreased activity tolerance;Decreased strength;Impaired  flexibility   Rehab Potential Excellent   Clinical Impairments Affecting Rehab Potential None   PT Frequency Monthy   PT Duration Other (comment)  6 months   PT Treatment/Interventions Manual techniques;Neuromuscular re-education;Patient/family education;Therapeutic exercise;Therapeutic activities;Biofeedback   PT Next Visit Plan advance pelvic floor exercise in sitting, abdominal strength   PT Home Exercise Plan pelvic floor exercise; abdominal strength   Recommended Other Services None   Consulted and Agree with Plan of Care Patient         Problem List Patient Active Problem List   Diagnosis Date Noted  . Constipation   . Viral URI with cough 01/29/2015  . Spondylosis, cervical, with myelopathy 01/11/2015  . Ulnar neuropathy of left upper extremity 01/11/2015  . Neck pain 12/21/2014  . Numbness of left hand 12/21/2014  . Left hand weakness 12/21/2014  . HTN (hypertension) 09/09/2014  . Plantar fasciitis, bilateral 09/09/2014  . Hyperlipidemia 02/22/2014  . DM type 2 (diabetes mellitus, type 2) ([place]) 02/09/2014  . Depression 06/14/2013  . Concentration deficit 06/02/2012  . ALLERGIC RHINITIS 09/25/2006    Earlie Counts, PT 04/08/2015 3:28 PM   [facility] [address], [place], [place], [address] Phone: [phone]   Fax:  [phone]  Name: Lindsay Rios MRN: [id number] Date of Birth: 03-Jan-1966

## 2015-04-08 NOTE — Patient Instructions (Addendum)
Piriformis Stretch, Supine    Lie supine, legs bent, feet flat. Raise one bent leg and, grasping ankle with both hands, pull leg toward opposite shoulder. Hold _30__ seconds.  Repeat __2_ times per session. Do _1__ sessions per day. Perform with other leg straight.  Copyright  VHI. All rights reserved.  Strengthening: Hip Abduction (Side-Lying)    Tighten muscles on front of left thigh, then lift leg __6__ inches from surface, keeping knee locked.  Repeat _10___ times per set. Do ___1_ sets per session. Do __1__ sessions per day.  http://orth.exer.us/622   Copyright  VHI. All rights reserved.    Quick Contraction: Gravity Eliminated (Side-Lying)    Lie on left side, hips and knees slightly bent. Quickly squeeze then fully relax pelvic floor. Perform _1__ sets of _5_. Rest for _1__ seconds between sets. Do _4__ times a day.   Copyright  VHI. All rights reserved.  Slow Contraction: Gravity Eliminated (Side-Lying)    Lie on left side, hips and knees slightly bent. Slowly squeeze pelvic floor for _10__ seconds. Rest for _5__ seconds. Repeat _1__ times. Do _4__ times a day.   Copyright  VHI. All rights reserved.  [place] [address] [place], [place] [address] Phone # [phone] Fax [phone]

## 2015-04-15 ENCOUNTER — Other Ambulatory Visit: Payer: Self-pay | Admitting: *Deleted

## 2015-04-15 MED ORDER — LISDEXAMFETAMINE DIMESYLATE 10 MG PO CAPS: 1.0000 | ORAL_CAPSULE | Freq: Every day | ORAL | Status: DC

## 2015-04-15 NOTE — Telephone Encounter (Signed)
Pt left voicemail at Triage requesting refill of Rx, last refilled on 02/26/15 #30 with 0 refills

## 2015-04-15 NOTE — Telephone Encounter (Signed)
Spoke to pt. Rx up front to pickup 

## 2015-04-15 NOTE — Telephone Encounter (Signed)
RX printed and signed and given to Shannon 

## 2015-05-04 ENCOUNTER — Ambulatory Visit: Payer: Medicaid Other | Attending: Internal Medicine | Admitting: Physical Therapy

## 2015-05-04 DIAGNOSIS — N907 Vulvar cyst: Secondary | ICD-10-CM | POA: Diagnosis present

## 2015-05-04 DIAGNOSIS — N8184 Pelvic muscle wasting: Secondary | ICD-10-CM | POA: Insufficient documentation

## 2015-05-04 DIAGNOSIS — M6289 Other specified disorders of muscle: Secondary | ICD-10-CM

## 2015-05-04 DIAGNOSIS — N8189 Other female genital prolapse: Secondary | ICD-10-CM

## 2015-05-04 NOTE — Therapy (Addendum)
[facility] [address] [place], [place], [address] Phone: [phone]   Fax:  [phone]  Physical Therapy Treatment  Patient Details  Name: Lindsay Rios MRN: [id number] Date of Birth: 1965/04/17 Referring Provider: Dr. Lucio Edward  Encounter Date: 05/04/2015      PT End of Session - 05/04/15 1113    Visit Number 2   Date for PT Re-Evaluation 10/09/15   Authorization Type Medicaid   PT Start Time 8768   PT Stop Time 1105   PT Time Calculation (min) 50 min   Activity Tolerance Patient tolerated treatment well   Behavior During Therapy [facility] for tasks assessed/performed      Past Medical History  Diagnosis Date  . Hypertension   . Depression   . Hyperlipidemia   . ADD (attention deficit disorder)   . Osteopenia   . 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 Pneumonia 2012  . Gestational diabetes 1999; 2002  . Type II diabetes mellitus ([place])     "borderline; I'm on Metformin"  . Ejection fraction   . History of chicken pox   . Allergy   . Numbness and tingling     Past Surgical History  Procedure Laterality Date  . Shoulder arthroscopy w/ rotator cuff repair Right 1990's  . Vaginal hysterectomy  2013    HPV/Cervical Dysplasia; partial  . Left heart catheterization with coronary angiogram N/A 12/22/2013    Procedure: LEFT HEART CATHETERIZATION WITH CORONARY ANGIOGRAM;  Surgeon: Jettie Booze, MD;  Location: [facility] CATH LAB;  Service: Cardiovascular;  Laterality: N/A;  . Anal rectal manometry N/A 03/17/2015    Procedure: ANO RECTAL MANOMETRY;  Surgeon: Mauri Pole, MD;  Location: [facility] ENDOSCOPY;  Service: Endoscopy;  Laterality: N/A;    There were no vitals filed for this visit.  Visit Diagnosis:  Pelvic floor dysfunction  Perineal floor weakness      Subjective Assessment - 05/04/15 1018    Subjective I have been doing my exercises.  I had a fecal accident.  I had to stop and  clean up before I got here.  The bowels were more solid.    Patient Stated Goals reduce fecal leakage   Currently in Pain? No/denies                      Pelvic Floor Special Questions - 05/04/15 0001    Biofeedback rest 3.80 uv; 3 quick contractions max 16.16 uv, avg. 8.70 uv;l 10 second contraction 9.07 uv and 20 sec. contraction 11.32 uv, rest 4.19 uv.   sitting   Biofeedback sensor type Surface  rectum   Biofeedback Activity Quick contraction;10 second hold;Obturator/adductor assist  assessment           [facility] Adult PT Treatment/Exercise - 05/04/15 0001    Therapeutic Activites    Therapeutic Activities Other Therapeutic Activities  toileting technique to empty bowels fully   Lumbar Exercises: Seated   Other Seated Lumbar Exercises pelvic floor contraction hold 10 sec 10 x with pelvic EMG   Other Seated Lumbar Exercises quick pelvic floor contraction with pelvic EMG   Lumbar Exercises: Supine   Ab Set 10 reps;Other (comment)   AB Set Limitations used towel to wrap around waist, ball between legs                PT Education - 05/04/15 1110    Education provided Yes   Education Details pelvic floor  contraction in sitting and supine, toileting technique, abdominal contraction   Person(s) Educated Patient   Methods Explanation;Demonstration;Verbal cues;Handout   Comprehension Returned demonstration;Verbalized understanding          PT Short Term Goals - 05/04/15 1116    PT SHORT TERM GOAL #1   Title independent with initial HeP   Time 4   Period Weeks   Status Achieved   PT SHORT TERM GOAL #2   Title understand how to contract the anus without contracting the gluteals for 10 times   Time 4   Period Weeks   Status On-going   PT SHORT TERM GOAL #3   Title fecal leakage decreased >/= 25%   Time 4   Period Weeks   Status On-going   PT SHORT TERM GOAL #4   Title urgency to have a bowel movement improved >/= 25%   Time 4   Period Weeks    Status On-going           PT Long Term Goals - 04/08/15 1525    PT LONG TERM GOAL #1   Title independent with HEP and understand how to progress herself   Time 6   Period Months   Status New   PT LONG TERM GOAL #2   Title fecal leakage decreased >/= 75%   Time 6   Period Months   Status New   PT LONG TERM GOAL #3   Title urgency to have a bowel movement decresaed >/= 75%   Time 6   Period Months   Status New   PT LONG TERM GOAL #4   Title pelvic floor strength >/= 4/5   Time 6   Period Months   Status New               Plan - 05/04/15 1114    Clinical Impression Statement Patient is a 50 year old female with diagnosis of pelvic floor dysfunction for 17 years.  Patient will contract her gluteals and raise her chest  instead of isolating. Patient is able to do pelvic  floor contraction in sitting.  Patient has difficutly with  contraction her lower abdomen and needed assistance of towel wraped around her abdomen. Patient will benefit form physical therapy to improve strength and reduce fecal leakage.    Pt will benefit from skilled therapeutic intervention in order to improve on the following deficits Decreased endurance;Decreased activity tolerance;Decreased strength;Impaired flexibility   Rehab Potential Excellent   Clinical Impairments Affecting Rehab Potential None   PT Frequency Monthy   PT Duration Other (comment)  6 months   PT Treatment/Interventions Manual techniques;Neuromuscular re-education;Patient/family education;Therapeutic exercise;Therapeutic activities;Biofeedback   PT Next Visit Plan work on hip and core strength with pelvic floor   PT Home Exercise Plan pelvic floor exercise; abdominal strength   Consulted and Agree with Plan of Care Patient        Problem List Patient Active Problem List   Diagnosis Date Noted  . Constipation   . Spondylosis, cervical, with myelopathy 01/11/2015  . Ulnar neuropathy of left upper extremity 01/11/2015  .  HTN (hypertension) 09/09/2014  . Plantar fasciitis, bilateral 09/09/2014  . Hyperlipidemia 02/22/2014  . DM type 2 (diabetes mellitus, type 2) ([place]) 02/09/2014  . Depression 06/14/2013  . Concentration deficit 06/02/2012  . ALLERGIC RHINITIS 09/25/2006    Earlie Counts, PT 05/04/2015 11:18 AM   [facility] [address], [place], [place], [address] Phone: [phone]   Fax:  [phone]  Name: SALA TAGUE MRN: [id number] Date of Birth: 20-Apr-1965  PHYSICAL THERAPY DISCHARGE SUMMARY  Visits from Start of Care: 2  Current functional level related to goals / functional outcomes: See above. Not assessed due to patient not returning after last visit.    Remaining deficits: See above.    Education / Equipment: HEP Plan:                                                    Patient goals were not met. Patient is being discharged due to not returning since the last visit.  Thank you for the visit. Earlie Counts, PT 12/21/15 4:46 PM  ?????

## 2015-05-04 NOTE — Patient Instructions (Signed)
Quick Contraction: Gravity Resisted (Sitting)    Sitting, quickly squeeze then fully relax pelvic floor. Perform _1__ sets of _5__. Rest for __1_ seconds between sets. Do __3_ times a day.  Copyright  VHI. All rights reserved.  Slow Contraction: Gravity Resisted (Sitting)    Sitting, slowly squeeze pelvic floor for _10__ seconds. Rest for _5__ seconds. Repeat _8__ times. Do _3__ times a day.  Copyright  VHI. All rights reserved.  Diastasis Recti Correction With Towel (Hook-Lying)    Place ball between knees. Loop long towel or sheet under back and criss-cross over lower abdomen. Inhale. In one fluid movement: Pull in navel, pull towel tight across abdomen, Squeeze ball, pull up pelvic floor and Hold for __3_ seconds. Return, rest for _3__ seconds. Repeat __10_ times. Do _2__ times a day.   Copyright  VHI. All rights reserved.   Toileting Techniques for Bowel Movements (Defecation) Using your belly (abdomen) and pelvic floor muscles to have a bowel movement is usually instinctive.  Sometimes people can have problems with these muscles and have to relearn proper defecation (emptying) techniques.  If you have weakness in your muscles, organs that are falling out, decreased sensation in your pelvis, or ignore your urge to go, you may find yourself straining to have a bowel movement.  You are straining if you are: . holding your breath or taking in a huge gulp of air and holding it  . keeping your lips and jaw tensed and closed tightly . turning red in the face because of excessive pushing or forcing . developing or worsening your  hemorrhoids . getting faint while pushing . not emptying completely and have to defecate many times a day  If you are straining, you are actually making it harder for yourself to have a bowel movement.  Many people find they are pulling up with the pelvic floor muscles and closing off instead of opening the anus. Due to lack pelvic floor relaxation and  coordination the abdominal muscles, one has to work harder to push the feces out.  Many people have never been taught how to defecate efficiently and effectively.  Notice what happens to your body when you are having a bowel movement.  While you are sitting on the toilet pay attention to the following areas: . Jaw and mouth position . Angle of your hips   . Whether your feet touch the ground or not . Arm placement  . Spine position . Waist . Belly tension . Anus (opening of the anal canal)  An Evacuation/Defecation Plan   Here are the 4 basic points:  1. Lean forward enough for your elbows to rest on your knees 2. Support your feet on the floor or use a low stool if your feet don't touch the floor  3. Push out your belly as if you have swallowed a beach ball-you should feel a widening of your waist 4. Open and relax your pelvic floor muscles, rather than tightening around the anus      The following conditions my require modifications to your toileting posture:  . If you have had surgery in the past that limits your back, hip, pelvic, knee or ankle flexibility . Constipation   Your healthcare practitioner may make the following additional suggestions and adjustments:  1) Sit on the toilet  a) Make sure your feet are supported. b) Notice your hip angle and spine position-most people find it effective to lean forward or raise their knees, which can help the muscles around  the anus to relax  c) When you lean forward, place your forearms on your thighs for support  2) Relax suggestions a) Breath deeply in through your nose and out slowly through your mouth as if you are smelling the flowers and blowing out the candles. b) To become aware of how to relax your muscles, contracting and releasing muscles can be helpful.  Pull your pelvic floor muscles in tightly by using the image of holding back gas, or closing around the anus (visualize making a circle smaller) and lifting the anus up  and in.  Then release the muscles and your anus should drop down and feel open. Repeat 5 times ending with the feeling of relaxation. c) Keep your pelvic floor muscles relaxed; let your belly bulge out. d) The digestive tract starts at the mouth and ends at the anal opening, so be sure to relax both ends of the tube.  Place your tongue on the roof of your mouth with your teeth separated.  This helps relax your mouth and will help to relax the anus at the same time.  3) Empty (defecation) a) Keep your pelvic floor and sphincter relaxed, then bulge your anal muscles.  Make the anal opening wide.  b) Stick your belly out as if you have swallowed a beach ball. c) Make your belly wall hard using your belly muscles while continuing to breathe. Doing this makes it easier to open your anus. d) Breath out and give a grunt (or try using other sounds such as ahhhh, shhhhh, ohhhh or grrrrrrr).  4) Finish a) As you finish your bowel movement, pull the pelvic floor muscles up and in.  This will leave your anus in the proper place rather than remaining pushed out and down. If you leave your anus pushed out and down, it will start to feel as though that is normal and give you incorrect signals about needing to have a bowel movement.    Earlie Counts, PT [facility] [address] [place] [address] [place], [place] [address]

## 2015-05-10 ENCOUNTER — Other Ambulatory Visit: Payer: Self-pay | Admitting: Internal Medicine

## 2015-05-10 NOTE — Telephone Encounter (Signed)
Per Webb Silversmith, NP okay to fill meloxicam

## 2015-05-12 ENCOUNTER — Ambulatory Visit: Payer: 59 | Admitting: Neurology

## 2015-05-25 ENCOUNTER — Encounter: Payer: Self-pay | Admitting: Neurology

## 2015-05-25 ENCOUNTER — Ambulatory Visit (INDEPENDENT_AMBULATORY_CARE_PROVIDER_SITE_OTHER): Payer: Medicaid Other | Admitting: Neurology

## 2015-05-25 VITALS — BP 141/85 | HR 79 | Ht 62.0 in | Wt 176.8 lb

## 2015-05-25 DIAGNOSIS — F32A Depression, unspecified: Secondary | ICD-10-CM

## 2015-05-25 DIAGNOSIS — F329 Major depressive disorder, single episode, unspecified: Secondary | ICD-10-CM

## 2015-05-25 DIAGNOSIS — G5622 Lesion of ulnar nerve, left upper limb: Secondary | ICD-10-CM | POA: Insufficient documentation

## 2015-05-25 DIAGNOSIS — M4712 Other spondylosis with myelopathy, cervical region: Secondary | ICD-10-CM

## 2015-05-25 MED ORDER — LISINOPRIL 20 MG PO TABS: 20.0000 mg | ORAL_TABLET | Freq: Every day | ORAL | Status: DC

## 2015-05-25 MED ORDER — LISDEXAMFETAMINE DIMESYLATE 10 MG PO CAPS: 1.0000 | ORAL_CAPSULE | Freq: Every day | ORAL | Status: DC

## 2015-05-25 MED ORDER — MELOXICAM 15 MG PO TABS: 15.0000 mg | ORAL_TABLET | Freq: Every day | ORAL | Status: DC

## 2015-05-25 MED ORDER — HYDROCHLOROTHIAZIDE 25 MG PO TABS: 25.0000 mg | ORAL_TABLET | Freq: Every day | ORAL | Status: DC

## 2015-05-25 MED ORDER — SIMVASTATIN 40 MG PO TABS: 40.0000 mg | ORAL_TABLET | Freq: Every day | ORAL | Status: DC

## 2015-05-25 NOTE — Progress Notes (Signed)
Chief Complaint  Patient presents with  . Numbness    She is still wearing an elbow brace but continues to have numbness in her left 4th and 5th fingers. The numbness in her left hand has resolved.      PATIENT: Lindsay Rios DOB: 1965-10-12  Chief Complaint  Patient presents with  . Numbness    She is still wearing an elbow brace but continues to have numbness in her left 4th and 5th fingers. The numbness in her left hand has resolved.     HISTORICAL  Lindsay Rios is a 50 years old left-handed female, seen in refer by her primary care nurse practitioner [name] in November 48 first 2016 for evaluation of left hand numbness.  She had a history of hypertension depression hyperlipidemia ADHD, woke up in August 26 2014, self left hand numbness, whole hand numbness last about 1 day, gradually disappeared, but ever since the event, she had a persistent left fourth, and fifth finger numbness, she also noticed mild weakness, drop things from her left hand,  She fell to neck muscle strain cross her bilateral shoulder, no radiating pain from neck to her left arm, she has chronic low back pain, no radiating pain to her back,  She also complains of mild unbalanced sensation, urinary and bowel urgency.  UPDATE Jan 11 2015: We have reviewed MRI cervical spine: At C5-C6 there is spinal stenosis due to right greater than left uncovertebral spurring and right paramedian disc protrusion. There is moderately severe right and moderate left foraminal narrowing. This could lead to right C6 nerve root compression. At C6-C7 there is broad disc bulging but there does not appear to be any nerve root impingement.  She complains of mild unsteady gait, urinary urgency, occasionally bowel urgency,  She returned for electrodiagnostic study today which demonstrate slight demyelinating left ulnar neuropathy most likely across left elbow, she does has left elbow Tinel sign  UPDATE April 25th  2017: Patient was seen by neurosurgeon who have suggested elective cervical decompression surgery, patient decided to hold of surgery, because of her responsibility for her parents and her children, she complains of mild unsteady gait, worsening urinary and bowel urgency, occasionally incontinence, her left hand paresthesia has much improved, continue have persistent left fourth and half of fifth finger paresthesia, left elbow sensitivity She was seen by urologist, is receiving pelvic rehabilitation  We personally reviewed MRI cervical film in November 2016, x-ray of cervical December 2016, there is evidence of C5-6 moderate canal stenosis, moderate bilateral foraminal stenosis, left worse than right, no cord signal changes   REVIEW OF SYSTEMS: Full 14 system review of systems performed and notable only for as above  ALLERGIES: Allergies  Allergen Reactions  . Sulfa Antibiotics     REACTION: nausea    HOME MEDICATIONS: Current Outpatient Prescriptions  Medication Sig Dispense Refill  . aspirin 81 MG chewable tablet Chew 81 mg by mouth daily.     Marland Kitchen FLUoxetine (PROZAC) 40 MG capsule Take 1 capsule (40 mg total) by mouth daily. 30 capsule 5  . hydrochlorothiazide (HYDRODIURIL) 25 MG tablet TAKE 1 TABLET BY MOUTH DAILY 30 tablet 0  . Lisdexamfetamine Dimesylate (VYVANSE) 10 MG CAPS Take 1 capsule by mouth daily. 30 capsule 0  . lisinopril (PRINIVIL,ZESTRIL) 20 MG tablet TAKE 1 TABLET BY MOUTH DAILY 30 tablet 0  . meloxicam (MOBIC) 15 MG tablet TAKE 1 TABLET BY MOUTH DAILY 30 tablet 0  . nitroGLYCERIN (NITROSTAT) 0.4 MG SL tablet  Place 1 tablet (0.4 mg total) under the tongue every 5 (five) minutes as needed for chest pain. 30 tablet 12  . simvastatin (ZOCOR) 40 MG tablet TAKE ONE TABLET BY MOUTH EVERY NIGHT AT BEDTIME 30 tablet 0   No current facility-administered medications for this visit.    PAST MEDICAL HISTORY: Past Medical History  Diagnosis Date  . Hypertension   . Depression     . Hyperlipidemia   . ADD (attention deficit disorder)   . Osteopenia   . 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 Pneumonia 2012  . Gestational diabetes 1999; 2002  . Type II diabetes mellitus ([place])     "borderline; I'm on Metformin"  . Ejection fraction   . History of chicken pox   . Allergy   . Numbness and tingling     PAST SURGICAL HISTORY: Past Surgical History  Procedure Laterality Date  . Shoulder arthroscopy w/ rotator cuff repair Right 1990's  . Vaginal hysterectomy  2013    HPV/Cervical Dysplasia; partial  . Left heart catheterization with coronary angiogram N/A 12/22/2013    Procedure: LEFT HEART CATHETERIZATION WITH CORONARY ANGIOGRAM;  Surgeon: Jettie Booze, MD;  Location: [facility] CATH LAB;  Service: Cardiovascular;  Laterality: N/A;  . Anal rectal manometry N/A 03/17/2015    Procedure: ANO RECTAL MANOMETRY;  Surgeon: Mauri Pole, MD;  Location: [facility] ENDOSCOPY;  Service: Endoscopy;  Laterality: N/A;    FAMILY HISTORY: Family History  Problem Relation Age of Onset  . Hyperlipidemia Mother   . Hyperlipidemia Father   . Hypertension Father   . Diabetes Father   . Heart disease Father   . Stroke Father   . Cancer Maternal Grandmother     Colon Cancer  . Alzheimer's disease Maternal Grandmother   . Heart disease Brother 52    Myocardial Infarction    SOCIAL HISTORY:  Social History   Social History  . Marital Status: Divorced    Spouse Name: [name] (Separated)   . Number of Children: 2  . Years of Education: 14   Occupational History  . 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 Smoking status: Never Smoker   . Smokeless tobacco: Never Used  . Alcohol Use: No     Comment: 12/19/2013 "might have a drink a couple times/year"  . Drug Use: No  . Sexual Activity:    Partners: Male   Other Topics Concern  . Not on file   Social History Narrative   Marital Status: Separated [name]) Significant  Other [name])    Children:  Son (1) Daughter (1)    Pets: Dogs (2), Cat (1)    Living Situation: Lives with children.   Occupation: Full- Time Student    Education: [employer] ([place])    Tobacco Use: She has never smoked.     Alcohol Use:  Rarely   Drug Use:  None   Diet:  Regular   Exercise:  None   Hobbies: Shopping           PHYSICAL EXAM   Filed Vitals:   05/25/15 1053  BP: 141/85  Pulse: 79  Height: 5\' 2"  (1.575 m)  Weight: 176 lb 12 oz (80.173 kg)    Not recorded      Body mass index is 32.32 kg/(m^2).  PHYSICAL EXAMNIATION:  Gen: NAD, conversant, well nourised, obese, well groomed  Cardiovascular: Regular rate rhythm, no peripheral edema, warm, nontender. Eyes: Conjunctivae clear without exudates or hemorrhage Neck: Supple, no carotid bruise. Pulmonary: Clear to auscultation bilaterally   NEUROLOGICAL EXAM:  MENTAL STATUS: Speech:    Speech is normal; fluent and spontaneous with normal comprehension.  Cognition:     Orientation to time, place and person     Normal recent and remote memory     Normal Attention span and concentration     Normal Language, naming, repeating,spontaneous speech     Fund of knowledge   CRANIAL NERVES: CN II: Visual fields are full to confrontation. Fundoscopic exam is normal with sharp discs and no vascular changes. Pupils are round equal and briskly reactive to light. CN III, IV, VI: extraocular movement are normal. No ptosis. CN V: Facial sensation is intact to pinprick in all 3 divisions bilaterally. Corneal responses are intact.  CN VII: Face is symmetric with normal eye closure and smile. CN VIII: Hearing is normal to rubbing fingers CN IX, X: Palate elevates symmetrically. Phonation is normal. CN XI: Head turning and shoulder shrug are intact CN XII: Tongue is midline with normal movements and no atrophy.  MOTOR: There is no pronator drift of out-stretched arms. Muscle bulk and tone are normal.  Muscle strength is normal, with exception of mild left finger abduction, grip weakness. She has left elbow Tinel sign  REFLEXES: Reflexes are 3 and symmetric at the biceps, triceps, knees, and ankles. Plantar responses are flexor.  SENSORY: Intact to light touch, pinprick, position sense, and vibration sense are intact in fingers and toes.  COORDINATION: Rapid alternating movements and fine finger movements are intact. There is no dysmetria on finger-to-nose and heel-knee-shin.    GAIT/STANCE: Posture is normal. Gait is steady with normal steps, base, arm swing, and turning. Heel and toe walking are normal. Mild difficulty with tandem walking Romberg is absent.   DIAGNOSTIC DATA (LABS, IMAGING, TESTING) - I reviewed patient records, labs, notes, testing and imaging myself where available.   ASSESSMENT AND PLAN  TYWANDA LEHNERT is a 51 y.o. female  left fourth and fifth finger paresthesia  Left ulnar neuropathy   Mild demyelinating nature, she has left elbow Tinel signs, not a candidate for surgical decompression, continue left elbow protection  Cervical spondylitic myelopathy  Hyperreflexia on examination, she also complains of mild unsteady gait, urinary or bowel urgency, six-month history of worsening occasionally bowel incontinence,  She was evaluated by neurosurgeon, who has suggested elective cervical decompression surgery, patient decided to hold off surgery at this point,  I have advised her to avoid  Hyperextending/flexion of her neck, continue moderate exercise, pelvic strengthening exercise   I also went over her medication schedule, refilled lisinopril, hydrochlorothiazide, Zocor, meloxicam, Vyvanse for one month,     Marcial Pacas, M.D. Ph.D.  [facility] [address] [place], [place] [address] Ph: [phone] Fax: [phone]  CC: [name], NP

## 2015-05-27 ENCOUNTER — Encounter: Payer: Self-pay | Admitting: Internal Medicine

## 2015-05-27 ENCOUNTER — Ambulatory Visit (INDEPENDENT_AMBULATORY_CARE_PROVIDER_SITE_OTHER): Payer: Medicaid Other | Admitting: Internal Medicine

## 2015-05-27 VITALS — BP 132/84 | HR 85 | Ht 62.0 in | Wt 177.4 lb

## 2015-05-27 DIAGNOSIS — R0683 Snoring: Secondary | ICD-10-CM

## 2015-05-27 DIAGNOSIS — G4719 Other hypersomnia: Secondary | ICD-10-CM | POA: Diagnosis not present

## 2015-05-27 NOTE — Patient Instructions (Signed)
Sleep Apnea  Sleep apnea is a sleep disorder characterized by abnormal pauses in breathing while you sleep. When your breathing pauses, the level of oxygen in your blood decreases. This causes you to move out of deep sleep and into light sleep. As a result, your quality of sleep is poor, and the system that carries your blood throughout your body (cardiovascular system) experiences stress. If sleep apnea remains untreated, the following conditions can develop:  High blood pressure (hypertension).  Coronary artery disease.  Inability to achieve or maintain an erection (impotence).  Impairment of your thought process (cognitive dysfunction). There are three types of sleep apnea: 1. Obstructive sleep apnea--Pauses in breathing during sleep because of a blocked airway. 2. Central sleep apnea--Pauses in breathing during sleep because the area of the brain that controls your breathing does not send the correct signals to the muscles that control breathing. 3. Mixed sleep apnea--A combination of both obstructive and central sleep apnea. RISK FACTORS The following risk factors can increase your risk of developing sleep apnea:  Being overweight.  Smoking.  Having narrow passages in your nose and throat.  Being of older age.  Being female.  Alcohol use.  Sedative and tranquilizer use.  Ethnicity. Among individuals younger than 35 years, African Americans are at increased risk of sleep apnea. SYMPTOMS   Difficulty staying asleep.  Daytime sleepiness and fatigue.  Loss of energy.  Irritability.  Loud, heavy snoring.  Morning headaches.  Trouble concentrating.  Forgetfulness.  Decreased interest in sex.  Unexplained sleepiness. DIAGNOSIS  In order to diagnose sleep apnea, your caregiver will perform a physical examination. A sleep study done in the comfort of your own home may be appropriate if you are otherwise healthy. Your caregiver may also recommend that you spend the  night in a sleep lab. In the sleep lab, several monitors record information about your heart, lungs, and brain while you sleep. Your leg and arm movements and blood oxygen level are also recorded. TREATMENT The following actions may help to resolve mild sleep apnea:  Sleeping on your side.   Using a decongestant if you have nasal congestion.   Avoiding the use of depressants, including alcohol, sedatives, and narcotics.   Losing weight and modifying your diet if you are overweight. There also are devices and treatments to help open your airway:  Oral appliances. These are custom-made mouthpieces that shift your lower jaw forward and slightly open your bite. This opens your airway.  Devices that create positive airway pressure. This positive pressure "splints" your airway open to help you breathe better during sleep. The following devices create positive airway pressure:  Continuous positive airway pressure (CPAP) device. The CPAP device creates a continuous level of air pressure with an air pump. The air is delivered to your airway through a mask while you sleep. This continuous pressure keeps your airway open.  Nasal expiratory positive airway pressure (EPAP) device. The EPAP device creates positive air pressure as you exhale. The device consists of single-use valves, which are inserted into each nostril and held in place by adhesive. The valves create very little resistance when you inhale but create much more resistance when you exhale. That increased resistance creates the positive airway pressure. This positive pressure while you exhale keeps your airway open, making it easier to breath when you inhale again.  Bilevel positive airway pressure (BPAP) device. The BPAP device is used mainly in patients with central sleep apnea. This device is similar to the CPAP device because   it also uses an air pump to deliver continuous air pressure through a mask. However, with the BPAP machine, the  pressure is set at two different levels. The pressure when you exhale is lower than the pressure when you inhale.  Surgery. Typically, surgery is only done if you cannot comply with less invasive treatments or if the less invasive treatments do not improve your condition. Surgery involves removing excess tissue in your airway to create a wider passage way.   This information is not intended to replace advice given to you by your health care provider. Make sure you discuss any questions you have with your health care provider.   Document Released: 01/06/2002 Document Revised: 02/06/2014 Document Reviewed: 05/25/2011 Elsevier Interactive Patient Education 2016 Elsevier Inc.   

## 2015-05-27 NOTE — Progress Notes (Signed)
[facility] Consultation      Date: 05/27/2015,   MRN# [id number] FAITH REDDIX 30-Jan-1966 Code Status:  Code Status History    Date Active Date Inactive Code Status Order ID Comments User Context   12/22/2013  2:10 PM 12/22/2013  7:30 PM Full Code [id number]  Sherren Mocha, MD Inpatient   12/19/2013  8:28 PM 12/20/2013  3:08 PM Full Code [id number]  Almyra Deforest, [place] day:@[email or web address] @ Referring MD: @ATDPROV @     PCP:      AdmissionWeight: 177 lb 6.4 oz (80.468 kg)                 CurrentWeight: 177 lb 6.4 oz (80.468 kg) TAKEYA OKUMA is a 50 y.o. 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 Hypertension   . Depression   . Hyperlipidemia   . ADD (attention deficit disorder)   . Osteopenia   . 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 Pneumonia 2012  . Gestational diabetes 1999; 2002  . Type II diabetes mellitus ([place])     "borderline; I'm on Metformin"  . Ejection fraction   . History of chicken pox   . Allergy   . Numbness and tingling      SURGICAL HISTORY   Past Surgical History  Procedure Laterality Date  . Shoulder arthroscopy w/ rotator cuff repair Right  1990's  . Vaginal hysterectomy  2013    HPV/Cervical Dysplasia; partial  . Left heart catheterization with coronary angiogram N/A 12/22/2013    Procedure: LEFT HEART CATHETERIZATION WITH CORONARY ANGIOGRAM;  Surgeon: Jettie Booze, MD;  Location: [facility] CATH LAB;  Service: Cardiovascular;  Laterality: N/A;  . Anal rectal manometry N/A 03/17/2015    Procedure: ANO RECTAL MANOMETRY;  Surgeon: Mauri Pole, MD;  Location: [facility] ENDOSCOPY;  Service: Endoscopy;  Laterality: N/A;     FAMILY HISTORY   Family History  Problem Relation Age of Onset  . Hyperlipidemia Mother   . Hyperlipidemia Father   . Hypertension Father   . Diabetes Father   . Heart disease Father   . Stroke Father   . Cancer Maternal Grandmother     Colon Cancer  . Alzheimer's disease Maternal Grandmother   . Heart disease Brother 5    Myocardial Infarction     SOCIAL HISTORY   Social History  Substance Use Topics  . Smoking status: Never Smoker   . Smokeless tobacco: Never Used  . Alcohol Use: No     Comment: 12/19/2013 "might have a drink a couple times/year"     MEDICATIONS    Home Medication:  Current Outpatient Rx  Name  Route  Sig  Dispense  Refill  . aspirin 81 MG chewable tablet   Oral   Chew 81 mg by mouth daily.          Marland Kitchen FLUoxetine (PROZAC) 40 MG capsule   Oral   Take 1 capsule (40 mg total) by mouth daily.   30 capsule   5   . hydrochlorothiazide (HYDRODIURIL) 25 MG tablet   Oral   Take 1 tablet (25 mg total) by mouth daily.   90 tablet   4   . Lisdexamfetamine Dimesylate (VYVANSE) 10 MG CAPS   Oral   Take 1 capsule by mouth daily.   30 capsule   0   . lisinopril (PRINIVIL,ZESTRIL) 20 MG tablet   Oral   Take 1 tablet (20 mg total) by mouth daily.   90 tablet   4   . meloxicam (MOBIC) 15 MG tablet   Oral   Take 1 tablet (15 mg total) by mouth daily.   30 tablet   6   . nitroGLYCERIN (NITROSTAT) 0.4 MG SL tablet   Sublingual   Place 1 tablet (0.4 mg total)  under the tongue every 5 (five) minutes as needed for chest pain.   30 tablet   12   . simvastatin (ZOCOR) 40 MG tablet   Oral   Take 1 tablet (40 mg total) by mouth at bedtime.   90 tablet   4     Current Medication:  Current outpatient prescriptions:  .  aspirin 81 MG chewable tablet, Chew 81 mg by mouth daily. , Disp: , Rfl:  .  FLUoxetine (PROZAC) 40 MG capsule, Take 1 capsule (40 mg total) by mouth daily., Disp: 30 capsule, Rfl: 5 .  hydrochlorothiazide (HYDRODIURIL) 25 MG tablet, Take 1 tablet (25 mg total) by mouth daily., Disp: 90 tablet, Rfl: 4 .  Lisdexamfetamine Dimesylate (VYVANSE) 10 MG CAPS, Take 1 capsule by mouth daily., Disp: 30 capsule, Rfl: 0 .  lisinopril (PRINIVIL,ZESTRIL) 20 MG tablet, Take 1 tablet (20 mg total) by mouth daily., Disp: 90 tablet, Rfl: 4 .  meloxicam (MOBIC) 15 MG tablet, Take 1 tablet (15 mg total) by mouth daily., Disp: 30 tablet, Rfl: 6 .  nitroGLYCERIN (NITROSTAT) 0.4 MG SL tablet, Place 1 tablet (0.4 mg total) under the tongue every 5 (five) minutes as needed for chest pain., Disp: 30 tablet, Rfl: 12 .  simvastatin (ZOCOR) 40 MG tablet, Take 1 tablet (40 mg total) by mouth at bedtime., Disp: 90 tablet, Rfl: 4    ALLERGIES   Sulfa antibiotics     REVIEW OF SYSTEMS   Review of Systems  Constitutional: Positive for malaise/fatigue. Negative for fever, chills, weight loss and diaphoresis.  HENT: Negative for congestion and hearing loss.   Eyes: Negative for blurred vision and double vision.  Respiratory: Negative for cough, hemoptysis, sputum production, shortness of breath and wheezing.   Cardiovascular: Negative for chest pain, palpitations and orthopnea.  Gastrointestinal: Negative for heartburn, nausea, vomiting and abdominal pain.  Genitourinary: Negative for dysuria and urgency.  Musculoskeletal: Negative for myalgias, back pain and neck pain.  Skin: Negative for rash.  Neurological: Negative for dizziness, tingling, tremors,  weakness and headaches.  Endo/Heme/Allergies: Does not bruise/bleed easily.  Psychiatric/Behavioral: Negative for depression, suicidal ideas and substance abuse.  All other systems reviewed and are negative.    VS: BP 132/84 mmHg  Pulse 85  Ht 5\' 2"  (1.575 m)  Wt 177 lb 6.4 oz (80.468  kg)  BMI 32.44 kg/m2  SpO2 100%     PHYSICAL EXAM  Physical Exam  Constitutional: She is oriented to person, place, and time. She appears well-developed and well-nourished. No distress.  HENT:  Head: Normocephalic and atraumatic.  Mouth/Throat: No oropharyngeal exudate.  Eyes: EOM are normal. Pupils are equal, round, and reactive to light. No scleral icterus.  Neck: Normal range of motion. Neck supple.  Cardiovascular: Normal rate, regular rhythm and normal heart sounds.   No murmur heard. Pulmonary/Chest: No stridor. No respiratory distress. She has no wheezes.  Abdominal: Soft. Bowel sounds are normal. She exhibits no distension.  Musculoskeletal: Normal range of motion. She exhibits no edema.  Neurological: She is alert and oriented to person, place, and time. No cranial nerve deficit.  Skin: Skin is warm. She is not diaphoretic.  Psychiatric: She has a normal mood and affect.         IMAGING    CXR 11/2013 images reviewed 05/27/2015    ASSESSMENT/PLAN  50 yo white female seen today for severe signs and symptoms of sleep apnea Patient will need sleep Study ASAP  1.sleep study to be scheduled-will set up for split night study 2.weight loss advised 3.recommend GI consult for fatty liver  follow up after  sleep study completed  The Patient requires high complexity decision making for assessment and support, frequent evaluation and titration of therapies, application of advanced monitoring technologies and extensive interpretation of multiple databases.  Patient satisfied with Plan of action and management. All questions answered  Corrin Parker, M.D.  [facility]  Medical Director [place] Director [facility] Cardio-Pulmonary Department

## 2015-05-31 ENCOUNTER — Encounter: Payer: Medicaid Other | Admitting: Physical Therapy

## 2015-06-07 ENCOUNTER — Ambulatory Visit (INDEPENDENT_AMBULATORY_CARE_PROVIDER_SITE_OTHER): Payer: Medicaid Other | Admitting: Internal Medicine

## 2015-06-07 ENCOUNTER — Encounter: Payer: Self-pay | Admitting: Internal Medicine

## 2015-06-07 VITALS — BP 122/84 | HR 81 | Temp 98.9°F | Wt 176.0 lb

## 2015-06-07 DIAGNOSIS — E785 Hyperlipidemia, unspecified: Secondary | ICD-10-CM

## 2015-06-07 DIAGNOSIS — E559 Vitamin D deficiency, unspecified: Secondary | ICD-10-CM

## 2015-06-07 DIAGNOSIS — R41840 Attention and concentration deficit: Secondary | ICD-10-CM

## 2015-06-07 DIAGNOSIS — E119 Type 2 diabetes mellitus without complications: Secondary | ICD-10-CM

## 2015-06-07 DIAGNOSIS — I10 Essential (primary) hypertension: Secondary | ICD-10-CM

## 2015-06-07 DIAGNOSIS — F329 Major depressive disorder, single episode, unspecified: Secondary | ICD-10-CM

## 2015-06-07 DIAGNOSIS — F32A Depression, unspecified: Secondary | ICD-10-CM

## 2015-06-07 DIAGNOSIS — M722 Plantar fascial fibromatosis: Secondary | ICD-10-CM

## 2015-06-07 DIAGNOSIS — J301 Allergic rhinitis due to pollen: Secondary | ICD-10-CM

## 2015-06-07 LAB — COMPREHENSIVE METABOLIC PANEL
ALT: 129 U/L — ABNORMAL HIGH (ref 0–35)
AST: 93 U/L — ABNORMAL HIGH (ref 0–37)
Albumin: 4.2 g/dL (ref 3.5–5.2)
Alkaline Phosphatase: 73 U/L (ref 39–117)
BUN: 13 mg/dL (ref 6–23)
CO2: 30 mEq/L (ref 19–32)
Calcium: 9.3 mg/dL (ref 8.4–10.5)
Chloride: 100 mEq/L (ref 96–112)
Creatinine, Ser: 0.69 mg/dL (ref 0.40–1.20)
GFR: 96.59 mL/min (ref >60.00)
Glucose, Bld: 161 mg/dL — ABNORMAL HIGH (ref 70–99)
Potassium: 3.7 mEq/L (ref 3.5–5.1)
Sodium: 137 mEq/L (ref 135–145)
Total Bilirubin: 0.6 mg/dL (ref 0.2–1.2)
Total Protein: 7.2 g/dL (ref 6.0–8.3)

## 2015-06-07 LAB — CBC
HCT: 44 % (ref 36.0–46.0)
Hemoglobin: 15 g/dL (ref 12.0–15.0)
MCHC: 34 g/dL (ref 30.0–36.0)
MCV: 86.2 fl (ref 78.0–100.0)
Platelets: 275 10*3/uL (ref 150.0–400.0)
RBC: 5.11 Mil/uL (ref 3.87–5.11)
RDW: 13.3 % (ref 11.5–15.5)
WBC: 13.5 10*3/uL — ABNORMAL HIGH (ref 4.0–10.5)

## 2015-06-07 LAB — LIPID PANEL
Cholesterol: 208 mg/dL — ABNORMAL HIGH (ref 0–200)
HDL: 48.7 mg/dL (ref >39.00)
LDL Cholesterol: 128 mg/dL — ABNORMAL HIGH (ref 0–99)
NonHDL: 159.22
Total CHOL/HDL Ratio: 4
Triglycerides: 155 mg/dL — ABNORMAL HIGH (ref 0.0–149.0)
VLDL: 31 mg/dL (ref 0.0–40.0)

## 2015-06-07 LAB — HEMOGLOBIN A1C: Hgb A1c MFr Bld: 8.3 % — ABNORMAL HIGH (ref 4.6–6.5)

## 2015-06-07 LAB — VITAMIN D 25 HYDROXY (VIT D DEFICIENCY, FRACTURES): VITD: 19.99 ng/mL — ABNORMAL LOW (ref 30.00–100.00)

## 2015-06-07 MED ORDER — LISDEXAMFETAMINE DIMESYLATE 20 MG PO CAPS: 20.0000 mg | ORAL_CAPSULE | Freq: Every day | ORAL | Status: DC

## 2015-06-07 NOTE — Assessment & Plan Note (Signed)
Will check A1C today No microalbumin secondary to ACEI therapy Foot exam today Encouraged her to consume a low fat, low carb diet Encouraged yearly eye exams

## 2015-06-07 NOTE — Assessment & Plan Note (Signed)
Will increase Vyvanse to 20 mg daily Advised her that she can not be getting this filled by multiple providers, if it occurs again, I will no longer refill for her

## 2015-06-07 NOTE — Assessment & Plan Note (Signed)
Advised her to start Allegra and Flonase OTC

## 2015-06-07 NOTE — Assessment & Plan Note (Signed)
Continue Meloxicam daily 

## 2015-06-07 NOTE — Assessment & Plan Note (Signed)
Controlled on Lisinopril and HCTZ Will check CBC and CMET today

## 2015-06-07 NOTE — Progress Notes (Signed)
Pre visit review using our clinic review tool, if applicable. No additional management support is needed unless otherwise documented below in the visit note. 

## 2015-06-07 NOTE — Assessment & Plan Note (Signed)
Controlled on her current dose of Prozac She is not interested in taking this daily, I advised her it does not work prn

## 2015-06-07 NOTE — Progress Notes (Signed)
Subjective:    Patient ID: Lindsay Rios, female    DOB: 18-Jul-1965, 50 y.o.   MRN: [id number]  HPI  Pt presents to the clinic today for follow up of chronic conditions:  Plantar Fasciitis: She is taking Meloxicam daily. It does seem to help.  Depression: Symptoms controlled on Prozac. She does admit that she only takes this about 3 times per week. She denies SI/HI.  ADD: She was diagnosed by Dr. Dion Saucier. She was never diagnosed by a psychiatrist. She has started taking the Vyvanse daily. She does feel that the 10 mg dose is not effective as it used to be. She would like to try increasing it to 20 mg daily.  DM2: She does not check her sugars at home. Her last A1C was checked 12/2014, 6.0%. She is prescribed Metformin 500 mg BID but reports she stopped taking it secondary to diarrhea. She is stopped taking her Tanzeum. She does try to consume a low carb diet but does feel like she could be better at it. Her last eye exam was 1 year ago. Flu 12/2014. Pneumovax, never. Prevnar 07/2014.  HTN: BP well controlled on Lisinopril and HCTZ. Her BP today is 122/84. Her last ECG was 11/2013.  HLD: Her last LDL was 122. She denies myalgias on Simvastatin. She does take ASA daily. She does consume a low fat diet.  Vit D deficiency: She is done with her Ergocalciferol. She is not taking any Vit D supplement OTC.  She has seen the pulmonologist in reference to her concern for OSA. She is scheduled for a sleep study 07/01/15.  She also c/o a runny nose, sore throat and cough. This started 3 days ago. She is blowing clear mucous out of her nose. The cough is productive of red/brown mucous (she only noticed this when she was brushing her teeth). She denies difficulty swallowing. She denies fever, chills or body aches. She has taken Mucinex with minimal relief. She has no history of seasonal allergies. She has had sick contacts diagnosed with pneumonia.  Review of Systems      Past Medical History    Diagnosis Date  . Hypertension   . Depression   . Hyperlipidemia   . ADD (attention deficit disorder)   . Osteopenia   . 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 Pneumonia 2012  . Gestational diabetes 1999; 2002  . Type II diabetes mellitus ([place])     "borderline; I'm on Metformin"  . Ejection fraction   . History of chicken pox   . Allergy   . Numbness and tingling     Current Outpatient Prescriptions  Medication Sig Dispense Refill  . aspirin 81 MG chewable tablet Chew 81 mg by mouth daily.     Marland Kitchen FLUoxetine (PROZAC) 40 MG capsule Take 1 capsule (40 mg total) by mouth daily. 30 capsule 5  . hydrochlorothiazide (HYDRODIURIL) 25 MG tablet Take 1 tablet (25 mg total) by mouth daily. 90 tablet 4  . Lisdexamfetamine Dimesylate (VYVANSE) 10 MG CAPS Take 1 capsule by mouth daily. 30 capsule 0  . lisinopril (PRINIVIL,ZESTRIL) 20 MG tablet Take 1 tablet (20 mg total) by mouth daily. 90 tablet 4  . meloxicam (MOBIC) 15 MG tablet Take 1 tablet (15 mg total) by mouth daily. 30 tablet 6  . nitroGLYCERIN (NITROSTAT) 0.4 MG SL tablet Place 1 tablet (0.4 mg total) under the tongue every 5 (five) minutes as needed for chest pain. 30 tablet 12  .  simvastatin (ZOCOR) 40 MG tablet Take 1 tablet (40 mg total) by mouth at bedtime. 90 tablet 4   No current facility-administered medications for this visit.    Allergies  Allergen Reactions  . Sulfa Antibiotics     REACTION: nausea    Family History  Problem Relation Age of Onset  . Hyperlipidemia Mother   . Hyperlipidemia Father   . Hypertension Father   . Diabetes Father   . Heart disease Father   . Stroke Father   . Cancer Maternal Grandmother     Colon Cancer  . Alzheimer's disease Maternal Grandmother   . Heart disease Brother 65    Myocardial Infarction    Social History   Social History  . Marital Status: Divorced    Spouse Name: [name] (Separated)   . Number of Children: 2  . Years of Education: 14   Occupational History   . 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 Smoking status: Never Smoker   . Smokeless tobacco: Never Used  . Alcohol Use: No     Comment: 12/19/2013 "might have a drink a couple times/year"  . Drug Use: No  . Sexual Activity:    Partners: Male   Other Topics Concern  . Not on file   Social History Narrative   Marital Status: Separated [name]) Significant Other [name])    Children:  Son (1) Daughter (1)    Pets: Dogs (2), Cat (1)    Living Situation: Lives with children.   Occupation: Full- Time Student    Education: [place] [employer]    Tobacco Use: She has never smoked.     Alcohol Use:  Rarely   Drug Use:  None   Diet:  Regular   Exercise:  None   Hobbies: Shopping           Constitutional: Denies fever, malaise, fatigue, headache or abrupt weight changes.  HEENT: Pt reports runny nose, sore throat. Denies eye pain, eye redness, ear pain, ringing in the ears, wax buildup, nasal congestion, bloody nose. Respiratory: Denies difficulty breathing, shortness of breath, cough or sputum production.   Cardiovascular: Denies chest pain, chest tightness, palpitations or swelling in the hands or feet.  Gastrointestinal: Denies abdominal pain, bloating, constipation, diarrhea or blood in the stool.  GU: Denies frequency, urgency, pain with urination, blood in urine, odor or discharge. Musculoskeletal: Denies decrease in range of motion, difficulty with gait, muscle pain or joint pain and swelling.  Skin: Denies redness, rashes, lesions or ulcercations.  Neurological: Pt reports difficulty concentrating. Denies dizziness, difficulty with memory, difficulty with speech or problems with balance and coordination.  Psych: Pt reports depression. Denies SI/HI.   No other specific complaints in a complete review of systems (except as listed in HPI above).  Objective:   Physical Exam  BP 122/84 mmHg  Pulse 81  Temp(Src) 98.9 F  (37.2 C) (Oral)  Wt 176 lb (79.833 kg)  SpO2 98%   General: Appears her stated age, obese well developed, well nourished in NAD. HEENT: Head: normal shape and size; Eyes: sclera white, no icterus, conjunctiva pink, PERRLA and EOMs intact;  Ears: TM's grey and intact, normal light reflex, + serous effusion bilaterally. Nose: mucosa boggy and moist. Throat: + PND, mucosa pink and moist. No exudates or lesions noted. Cardiovascular: Normal rate and rhythm. S1,S2 noted.  No murmur, rubs or gallops noted. No JVD or BLE edema.  No carotid bruits noted. Pulmonary/Chest: Normal effort and positive vesicular breath sounds. No respiratory distress. No wheezes, rales or ronchi noted.  Neurological: Alert and oriented. Sensation intact to bilateral hands and feet.  Psychiatric: Mood and affect normal. Behavior is normal. Judgment and thought content normal.        Assessment & Plan:    RTC in 6 months for annual exam/followup

## 2015-06-07 NOTE — Patient Instructions (Signed)
Allergic Rhinitis Allergic rhinitis is when the mucous membranes in the nose respond to allergens. Allergens are particles in the air that cause your body to have an allergic reaction. This causes you to release allergic antibodies. Through a chain of events, these eventually cause you to release histamine into the blood stream. Although meant to protect the body, it is this release of histamine that causes your discomfort, such as frequent sneezing, congestion, and an itchy, runny nose.  CAUSES Seasonal allergic rhinitis (hay fever) is caused by pollen allergens that may come from grasses, trees, and weeds. Year-round allergic rhinitis (perennial allergic rhinitis) is caused by allergens such as house dust mites, pet dander, and mold spores. SYMPTOMS  Nasal stuffiness (congestion).  Itchy, runny nose with sneezing and tearing of the eyes. DIAGNOSIS Your health care provider can help you determine the allergen or allergens that trigger your symptoms. If you and your health care provider are unable to determine the allergen, skin or blood testing may be used. Your health care provider will diagnose your condition after taking your health history and performing a physical exam. Your health care provider may assess you for other related conditions, such as asthma, pink eye, or an ear infection. TREATMENT Allergic rhinitis does not have a cure, but it can be controlled by:  Medicines that block allergy symptoms. These may include allergy shots, nasal sprays, and oral antihistamines.  Avoiding the allergen. Hay fever may often be treated with antihistamines in pill or nasal spray forms. Antihistamines block the effects of histamine. There are over-the-counter medicines that may help with nasal congestion and swelling around the eyes. Check with your health care provider before taking or giving this medicine. If avoiding the allergen or the medicine prescribed do not work, there are many new medicines  your health care provider can prescribe. Stronger medicine may be used if initial measures are ineffective. Desensitizing injections can be used if medicine and avoidance does not work. Desensitization is when a patient is given ongoing shots until the body becomes less sensitive to the allergen. Make sure you follow up with your health care provider if problems continue. HOME CARE INSTRUCTIONS It is not possible to completely avoid allergens, but you can reduce your symptoms by taking steps to limit your exposure to them. It helps to know exactly what you are allergic to so that you can avoid your specific triggers. SEEK MEDICAL CARE IF:  You have a fever.  You develop a cough that does not stop easily (persistent).  You have shortness of breath.  You start wheezing.  Symptoms interfere with normal daily activities.   This information is not intended to replace advice given to you by your health care provider. Make sure you discuss any questions you have with your health care provider.   Document Released: 10/11/2000 Document Revised: 02/06/2014 Document Reviewed: 09/23/2012 Elsevier Interactive Patient Education 2016 Elsevier Inc.  

## 2015-06-07 NOTE — Assessment & Plan Note (Signed)
Will check Vit D today 

## 2015-06-07 NOTE — Assessment & Plan Note (Signed)
Will check CMET and Lipid Profile today Encouraged her to consume a low fat diet Advised her to continue her baby ASA daily

## 2015-06-08 ENCOUNTER — Encounter: Payer: Self-pay | Admitting: Physical Therapy

## 2015-06-09 ENCOUNTER — Ambulatory Visit: Payer: 59 | Admitting: Internal Medicine

## 2015-06-11 ENCOUNTER — Telehealth: Payer: Self-pay | Admitting: Internal Medicine

## 2015-06-11 MED ORDER — GLIPIZIDE 10 MG PO TABS
10.0000 mg | ORAL_TABLET | Freq: Two times a day (BID) | ORAL | Status: DC
Start: 2015-06-11 — End: 2015-10-22

## 2015-06-11 MED ORDER — VITAMIN D (ERGOCALCIFEROL) 1.25 MG (50000 UNIT) PO CAPS: 50000.0000 [IU] | ORAL_CAPSULE | ORAL | Status: DC

## 2015-06-11 NOTE — Telephone Encounter (Signed)
Patient went to [facility].  Patient said they didn't have the Vitamin D or Glipizide.  Please call in medication and call patient back when medication is called in to [facility].

## 2015-06-11 NOTE — Telephone Encounter (Signed)
Rx called in to pharmacy. 

## 2015-06-11 NOTE — Addendum Note (Signed)
Addended by: Lurlean Nanny on: 06/11/2015 04:42 PM   Modules accepted: Orders

## 2015-06-15 LAB — HM DIABETES EYE EXAM

## 2015-06-16 ENCOUNTER — Encounter: Payer: Self-pay | Admitting: Internal Medicine

## 2015-06-18 ENCOUNTER — Telehealth: Payer: Self-pay | Admitting: Internal Medicine

## 2015-06-18 NOTE — Telephone Encounter (Signed)
Routing this call to [employer]

## 2015-06-18 NOTE — Telephone Encounter (Signed)
Lindsay Rios came in saying the sleep study she was scheduled for tonight at [facility]) told her they don't accept Medicaid insurance. She's wondering if she can be referred to a location who accepts Medicaid. She'd like a phone call regarding this. She rescheduled her appt at the hospital for next week but said she'll cancel it since they absolutely don't accept Medicaid.   Pt's ph# [phone] Thank you.

## 2015-06-21 ENCOUNTER — Telehealth: Payer: Self-pay | Admitting: Internal Medicine

## 2015-06-21 NOTE — Telephone Encounter (Signed)
According to [facility] at [facility], what they stated [facility] was needing was a referral or NPI from her primary care physician due to pt having [place] [facility].  Pt is in the process of changing PCP and is not scheduled to see her until Nov 2017.  The NPI number and referral from PCP are no longer required by [facility] Medicaid.  Contacted patient back and explained what [facility] was telling her and that I contacted the sleep lab in [place] and [facility] no longer requires referrals or NPI from the primary care physicians either.  Pt asked if I would cancel her sleep study at [facility] and asked if I would contact [facility] and ask them to contact her, which I did.  Spoke with Terri at the [facility] and she will contact her to arrange Split Night there.  Pt is aware and nothing else needed at this point. Lindsay Rios

## 2015-06-21 NOTE — Telephone Encounter (Signed)
Patient wants to know if there is another place to refer her for a sleep study.. Patient says the sleep center and medicaid say she is not covered by insurance to have this done at [place].  Please call patient.

## 2015-06-21 NOTE — Telephone Encounter (Signed)
Per pt she received a call from [facility] stating that if she had her sleep study with [facility] that Medicaid would not cover her study there.  LMOAM for Jacki at [facility] to return my call in reference to this issue. Rhonda J Cobb

## 2015-06-22 NOTE — Telephone Encounter (Signed)
Spoke with Terri at the [place], [facility] for pt to return call to schedule study. Pt has not returned call to schedule.  Called patient and did not receive an answer. LMOAM for pt to contact Terri at the [facility] phone # [phone] to schedule study. Advised patient that currently the lab is booking into July, however, if patient ask, [facility] will also place her on a cancellation list for a sooner appointment. Rhonda J Cobb

## 2015-06-23 NOTE — Telephone Encounter (Signed)
Split Night Sleep Study scheduled for Friday 08/13/15 at [place]. No pre cert required. Appointment scheduled with patient, therefore, pt is aware of appointment date, time and location. Rhonda J Cobb

## 2015-07-09 ENCOUNTER — Other Ambulatory Visit: Payer: Self-pay | Admitting: Internal Medicine

## 2015-07-09 NOTE — Telephone Encounter (Signed)
Medication sent electronically 

## 2015-07-09 NOTE — Telephone Encounter (Signed)
Electronic refill request. Last Filled:    30 capsule 5 12/10/2014  Pt has CPE scheduled in November.  Okay to refill till then?

## 2015-07-13 ENCOUNTER — Other Ambulatory Visit: Payer: Self-pay

## 2015-07-13 NOTE — Telephone Encounter (Signed)
Pt left vm requesting rx vyvanse. Call when ready for pick up. Pt last seen and rx last printed # 30 on 06/07/15.

## 2015-07-14 MED ORDER — LISDEXAMFETAMINE DIMESYLATE 20 MG PO CAPS
20.0000 mg | ORAL_CAPSULE | Freq: Every day | ORAL | Status: DC
Start: 2015-07-14 — End: 2015-08-12

## 2015-07-14 NOTE — Telephone Encounter (Signed)
Rx left in front office for pick up and Left detailed msg on VM per HIPAA  

## 2015-07-14 NOTE — Telephone Encounter (Signed)
RX printed and signed and placed in MYD box 

## 2015-08-05 ENCOUNTER — Telehealth: Payer: Self-pay | Admitting: Internal Medicine

## 2015-08-05 ENCOUNTER — Other Ambulatory Visit: Payer: Self-pay | Admitting: Internal Medicine

## 2015-08-05 DIAGNOSIS — R748 Abnormal levels of other serum enzymes: Secondary | ICD-10-CM

## 2015-08-05 NOTE — Telephone Encounter (Signed)
Pt called asking about the ultrasound of the liver that was discussed after results of last lab visit a few months ago. Please call [phone], ok to leave detailed msg.

## 2015-08-05 NOTE — Telephone Encounter (Signed)
Ultrasound ordered, someone will call her to schedule

## 2015-08-05 NOTE — Telephone Encounter (Signed)
Spoke to pt and gave her more information to why the [place] is needed.... patient expressed understanding---pt reported that she had other things going on where it made her have to put it off---pt would like to get the [place] of liver per your recommendation from 05/2015 OV and labs

## 2015-08-12 ENCOUNTER — Other Ambulatory Visit: Payer: Self-pay

## 2015-08-12 NOTE — Telephone Encounter (Signed)
Pt left v/m requesting rx vyvanse. Call when ready for pick up. Last printed # 30 on 07/14/15. Last seen 06/07/15.

## 2015-08-13 ENCOUNTER — Ambulatory Visit (HOSPITAL_BASED_OUTPATIENT_CLINIC_OR_DEPARTMENT_OTHER): Payer: Medicaid Other | Attending: Internal Medicine | Admitting: Internal Medicine

## 2015-08-13 VITALS — Ht 62.0 in | Wt 180.0 lb

## 2015-08-13 DIAGNOSIS — G4733 Obstructive sleep apnea (adult) (pediatric): Secondary | ICD-10-CM | POA: Insufficient documentation

## 2015-08-13 DIAGNOSIS — G4719 Other hypersomnia: Secondary | ICD-10-CM | POA: Diagnosis not present

## 2015-08-13 DIAGNOSIS — R0683 Snoring: Secondary | ICD-10-CM | POA: Insufficient documentation

## 2015-08-13 MED ORDER — LISDEXAMFETAMINE DIMESYLATE 20 MG PO CAPS: 20.0000 mg | ORAL_CAPSULE | Freq: Every day | ORAL | Status: DC

## 2015-08-13 NOTE — Telephone Encounter (Signed)
RX printed and signed and placed in MYD box 

## 2015-08-16 NOTE — Telephone Encounter (Signed)
Rx left in front office for pick up and Left detailed msg on VM per HIPAA  

## 2015-08-18 ENCOUNTER — Other Ambulatory Visit: Payer: Medicaid Other

## 2015-08-21 DIAGNOSIS — R0683 Snoring: Secondary | ICD-10-CM

## 2015-08-21 DIAGNOSIS — G4719 Other hypersomnia: Secondary | ICD-10-CM

## 2015-08-21 NOTE — Procedures (Signed)
Patient Name: Lindsay Rios, Lindsay Rios Date: 08/13/2015 Gender: Female D.O.B: 08-03-1965 Age (years): 50 Referring Provider: Flora Lipps Height (inches): 62 Interpreting Physician: Baird Lyons MD, ABSM Weight (lbs): 180 RPSGT: Joni Reining BMI: 33 MRN: [id number] Neck Size: 15.50 CLINICAL INFORMATION Sleep Study Type: Split Night CPAP Indication for sleep study: Excessive Daytime Sleepiness, Snoring Epworth Sleepiness Score: 22  SLEEP STUDY TECHNIQUE As per the AASM Manual for the Scoring of Sleep and Associated Events v2.3 (April 2016) with a hypopnea requiring 4% desaturations. The channels recorded and monitored were frontal, central and occipital EEG, electrooculogram (EOG), submentalis EMG (chin), nasal and oral airflow, thoracic and abdominal wall motion, anterior tibialis EMG, snore microphone, electrocardiogram, and pulse oximetry. Continuous positive airway pressure (CPAP) was initiated when the patient met split night criteria and was titrated according to treat sleep-disordered breathing.  MEDICATIONS Medications taken by the patient : charted for review Medications administered by patient during sleep study : No sleep medicine administered.  RESPIRATORY PARAMETERS Diagnostic Total AHI (/hr): 37.3 RDI (/hr): 37.3 OA Index (/hr): 10.4 CA Index (/hr): 0.0 REM AHI (/hr): 71.2 NREM AHI (/hr): 28.1 Supine AHI (/hr): 51.4 Non-supine AHI (/hr): 35.66 Min O2 Sat (%): 71.00 Mean O2 (%): 91.44 Time below 88% (min): 14.3   Titration Optimal Pressure (cm): 8 AHI at Optimal Pressure (/hr): 1.1 Min O2 at Optimal Pressure (%): 87.0 Supine % at Optimal (%): 70 Sleep % at Optimal (%): 87    SLEEP ARCHITECTURE The recording time for the entire night was 392.6 minutes. During a baseline period of 194.4 minutes, the patient slept for 138.5 minutes in REM and nonREM, yielding a sleep efficiency of 71.3%. Sleep onset after lights out was 38.8 minutes with a REM latency of  125.0 minutes. The patient spent 1.81% of the night in stage N1 sleep, 50.18% in stage N2 sleep, 26.71% in stage N3 and 21.30% in REM. During the titration period of 195.0 minutes, the patient slept for 176.3 minutes in REM and nonREM, yielding a sleep efficiency of 90.4%. Sleep onset after CPAP initiation was 5.2 minutes with a REM latency of 67.5 minutes. The patient spent 12.19% of the night in stage N1 sleep, 29.38% in stage N2 sleep, 25.81% in stage N3 and 32.61% in REM.  CARDIAC DATA The 2 lead EKG demonstrated sinus rhythm. The mean heart rate was 80.07 beats per minute. Other EKG findings include: None.  LEG MOVEMENT DATA The total Periodic Limb Movements of Sleep (PLMS) were 13. The PLMS index was 2.48.  Arousal index 1.3 .  IMPRESSIONS - Severe obstructive sleep apnea occurred during the diagnostic portion of the study (AHI = 37.3/hour). An optimal PAP pressure was selected for this patient ( 8 cm of water) - No significant central sleep apnea occurred during the diagnostic portion of the study (CAI = 0.0/hour). - Moderate oxygen desaturation was noted during the diagnostic portion of the study (Min O2 =71.00%). - The patient snored with Moderate snoring volume during the diagnostic portion of the study. - No cardiac abnormalities were noted during this study. - Mild periodic limb movements of sleep occurred during the study.  DIAGNOSIS - Obstructive Sleep Apnea (327.23 [G47.33 ICD-10])  RECOMMENDATIONS - Trial of CPAP therapy on 8 cm H2O with a Small size Resmed Nasal Pillow Mask AirFit P10 mask and heated humidification. - Avoid alcohol, sedatives and other CNS depressants that may worsen sleep apnea and disrupt normal sleep architecture. - Sleep hygiene should be reviewed to assess factors that may improve sleep  quality. - Weight management and regular exercise should be initiated or continued.  [Electronically signed] 08/21/2015 11:38 AM  Baird Lyons MD, ABSM Diplomate,  American Board of Sleep Medicine   NPI: [id number] [place], American Board of Sleep Medicine  ELECTRONICALLY SIGNED ON:  08/21/2015, 11:36 AM [place] PH: [phone]   FX: [phone] [place]

## 2015-08-23 ENCOUNTER — Encounter: Payer: Self-pay | Admitting: Internal Medicine

## 2015-08-23 ENCOUNTER — Ambulatory Visit
Admission: RE | Admit: 2015-08-23 | Discharge: 2015-08-23 | Disposition: A | Discharge status: Home / Self Care | Payer: Medicaid Other | Source: Ambulatory Visit | Attending: Internal Medicine | Admitting: Internal Medicine

## 2015-08-23 DIAGNOSIS — R748 Abnormal levels of other serum enzymes: Secondary | ICD-10-CM

## 2015-08-23 IMAGING — US US ABDOMEN LIMITED
1 series · 14 of 25 positions shown · non-contrast
Comparison: No recent prior.

CLINICAL DATA: Elevated liver enzymes.

EXAM:
US ABDOMEN LIMITED - RIGHT UPPER QUADRANT

[Series 1: us abdomen limited · 0.28mm/px · 14 of 53 slices shown]
[im 1/53]
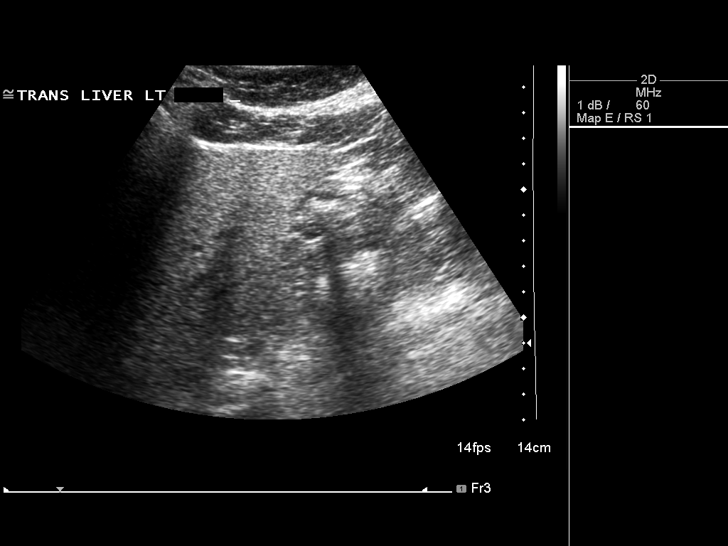
[im 5/53]
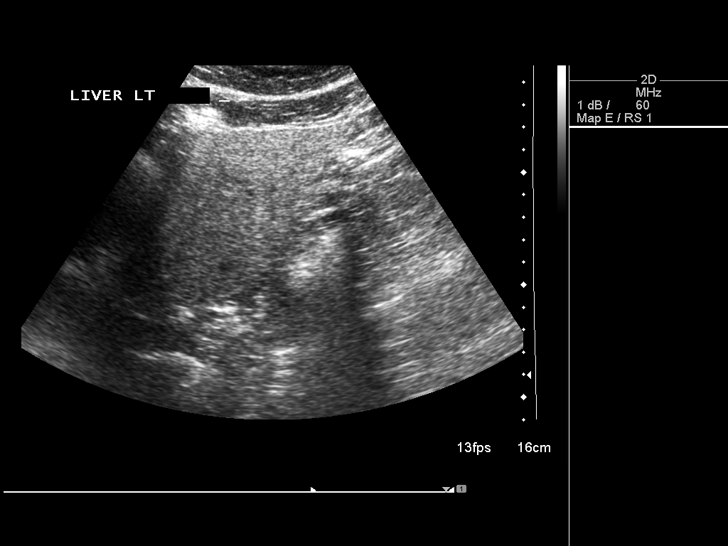
[im 9/53]
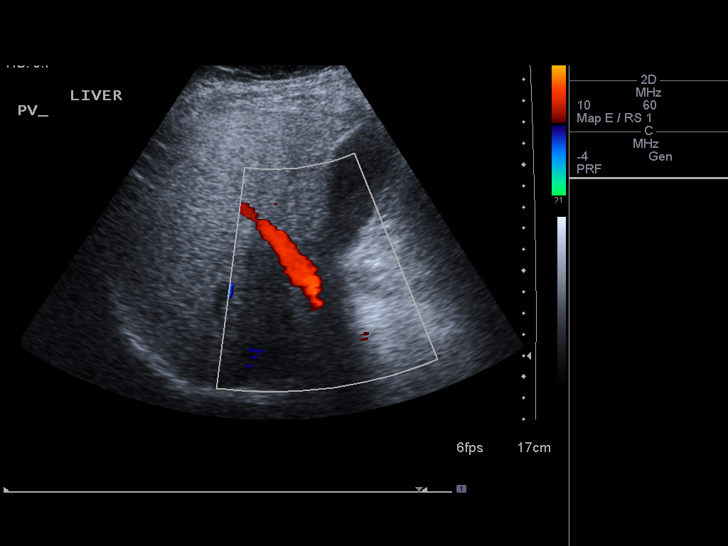
[im 14/53]
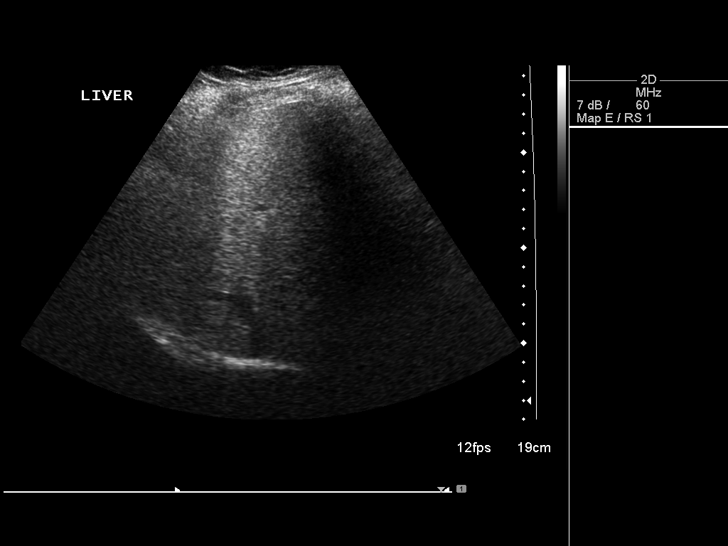
[im 18/53]
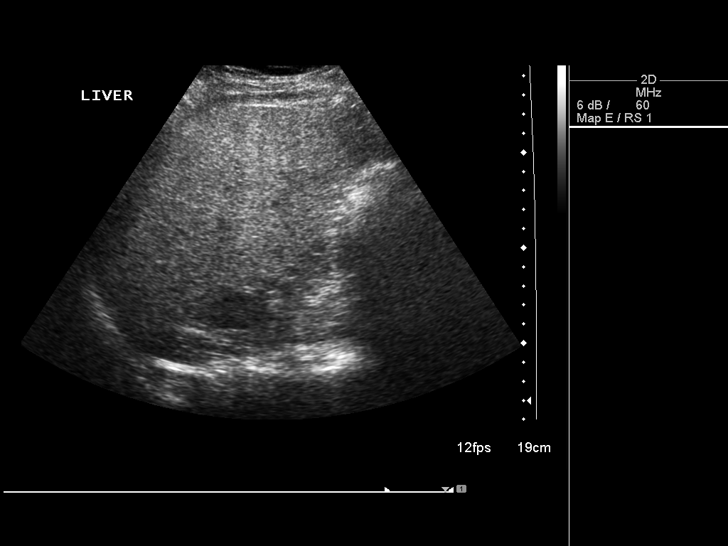
[im 20/53]
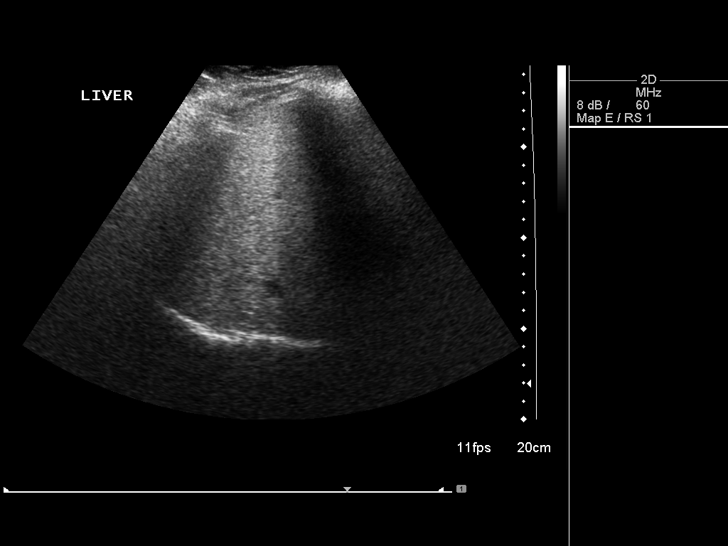
[im 24/53]
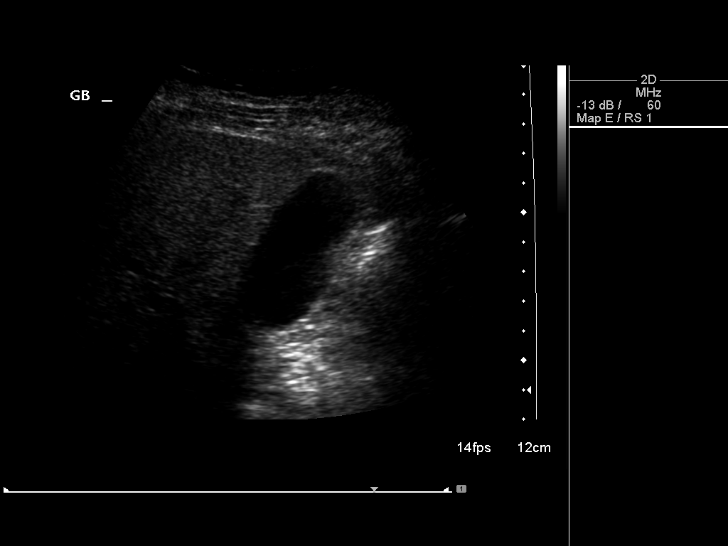
[im 29/53]
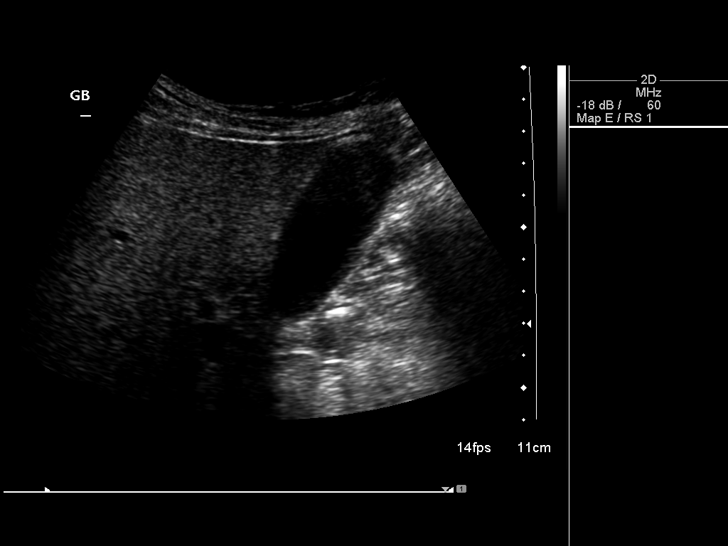
[im 33/53]
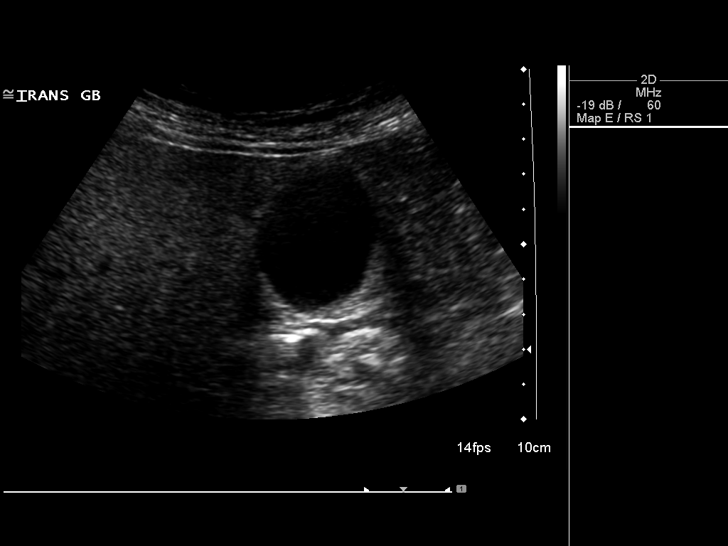
[im 35/53]
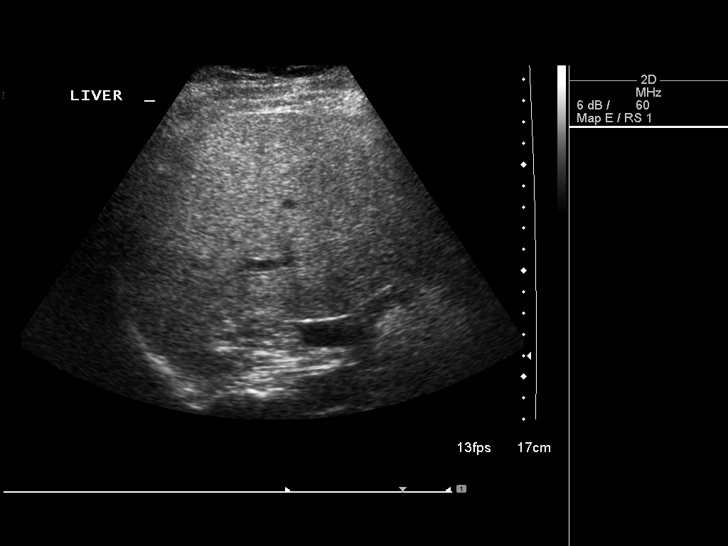
[im 40/53]
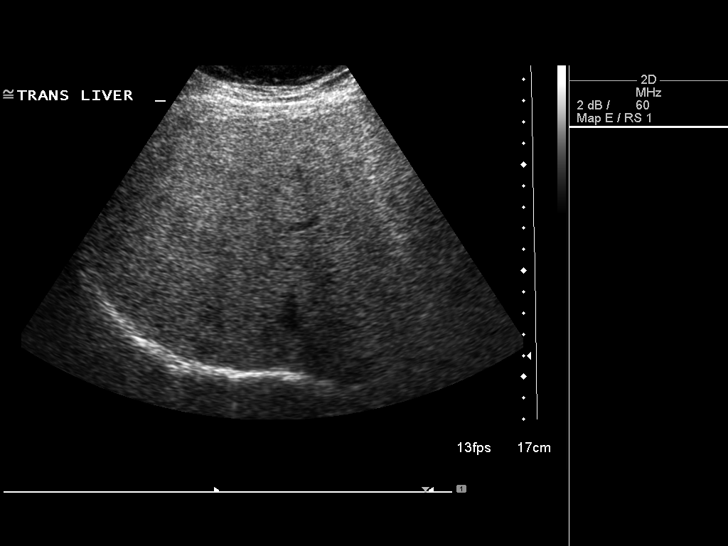
[im 44/53]
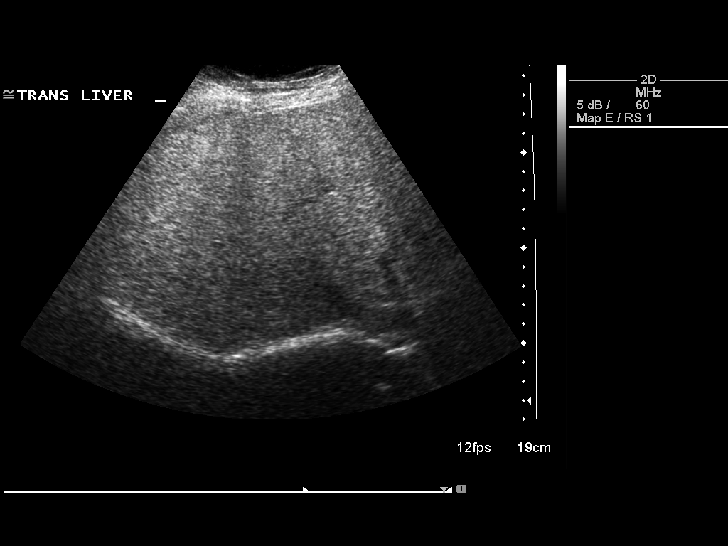
[im 48/53]
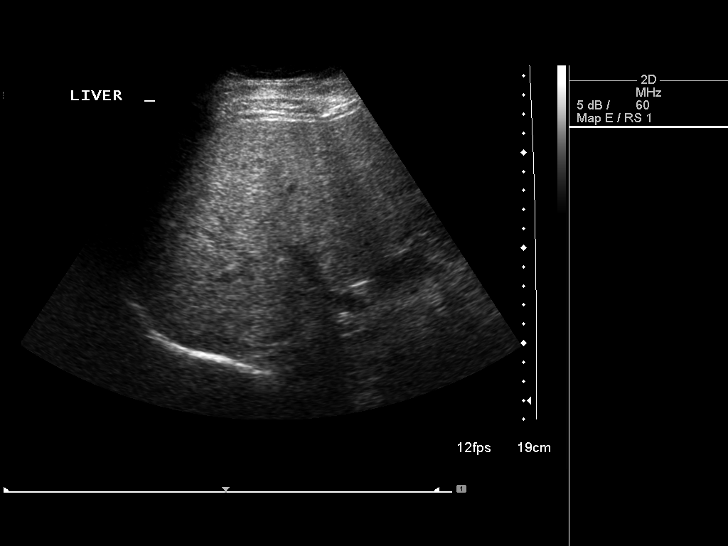
[im 53/53]
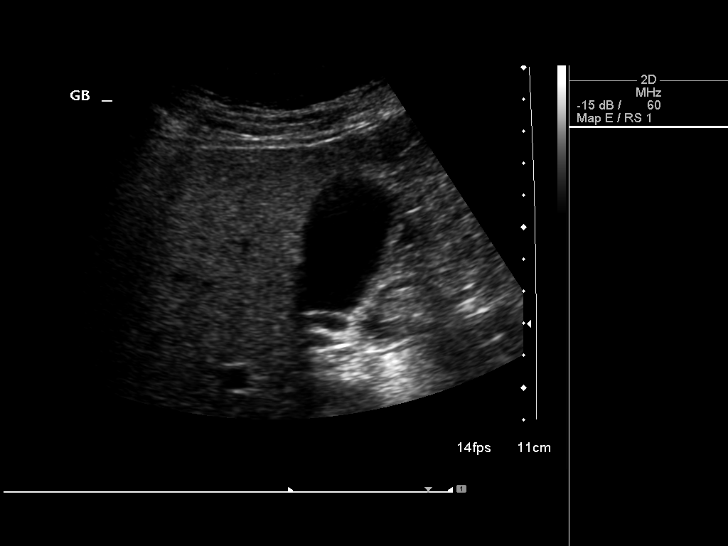

[14 of 25 positions shown; findings below may reference images not displayed]

FINDINGS: Gallbladder:

No gallstones or wall thickening visualized. No sonographic Murphy
sign noted by sonographer.

Common bile duct:

Diameter: 3.6 mm

Liver:

Liver is echogenic consistent fatty infiltration and/or
hepatocellular disease. No focal hepatic abnormality identified.
IMPRESSION: Liver is echogenic consistent with fatty infiltration and/or
hepatocellular disease. No focal abnormality identified. No
gallstones or biliary distention.

## 2015-08-25 ENCOUNTER — Telehealth: Payer: Self-pay | Admitting: Internal Medicine

## 2015-08-25 ENCOUNTER — Telehealth: Payer: Self-pay

## 2015-08-25 DIAGNOSIS — G4733 Obstructive sleep apnea (adult) (pediatric): Secondary | ICD-10-CM

## 2015-08-25 NOTE — Telephone Encounter (Signed)
Pt would like to know sleep study results. Please call. Ok to leave a detailed msg.

## 2015-08-25 NOTE — Telephone Encounter (Signed)
Pt left v/m requesting [place] of abdomen results.

## 2015-08-25 NOTE — Telephone Encounter (Signed)
LMOM letting pt know that she is positive for sleep apnea. Order placed for CPAP machine. Nothing further needed.

## 2015-08-27 NOTE — Telephone Encounter (Signed)
See result note.  

## 2015-09-07 ENCOUNTER — Encounter: Payer: Self-pay | Admitting: Internal Medicine

## 2015-09-21 ENCOUNTER — Other Ambulatory Visit: Payer: Self-pay

## 2015-09-21 MED ORDER — LISDEXAMFETAMINE DIMESYLATE 20 MG PO CAPS: 20.0000 mg | ORAL_CAPSULE | Freq: Every day | ORAL | 0 refills | Status: DC

## 2015-09-21 NOTE — Telephone Encounter (Signed)
RX printed and signed and placed in MYD box 

## 2015-09-21 NOTE — Telephone Encounter (Signed)
Pt left v/m requesting rx vyvanse; call when ready for pick up. Last printed # 30 on 08/13/15. Last seen 06/07/15.

## 2015-09-22 NOTE — Telephone Encounter (Signed)
Rx left in front office for pick up and pt is aware  

## 2015-10-05 ENCOUNTER — Encounter: Payer: Self-pay | Admitting: Internal Medicine

## 2015-10-22 ENCOUNTER — Other Ambulatory Visit: Payer: Self-pay | Admitting: Internal Medicine

## 2015-10-26 ENCOUNTER — Telehealth: Payer: Self-pay | Admitting: Internal Medicine

## 2015-10-26 NOTE — Telephone Encounter (Signed)
Pt called in to request a refill of her Vyvanse 20 mg.  She knows she has to come into the office to pick up the RX.  Please call her when its ready for pick up.  She can be reached at [phone]. Thanks.

## 2015-10-27 ENCOUNTER — Other Ambulatory Visit: Payer: Self-pay | Admitting: Internal Medicine

## 2015-10-27 MED ORDER — LISDEXAMFETAMINE DIMESYLATE 20 MG PO CAPS: 20.0000 mg | ORAL_CAPSULE | Freq: Every day | ORAL | 0 refills | Status: DC

## 2015-10-27 NOTE — Telephone Encounter (Signed)
RX printed and signed and placed in MYD box 

## 2015-10-27 NOTE — Telephone Encounter (Signed)
Left detailed msg on VM per HIPAA  

## 2015-10-28 LAB — HEMOGLOBIN A1C: Hemoglobin A1C: 9.6

## 2015-11-30 ENCOUNTER — Other Ambulatory Visit: Payer: Self-pay

## 2015-11-30 MED ORDER — LISDEXAMFETAMINE DIMESYLATE 20 MG PO CAPS: 20.0000 mg | ORAL_CAPSULE | Freq: Every day | ORAL | 0 refills | Status: DC

## 2015-11-30 NOTE — Telephone Encounter (Signed)
RX printed and signed and placed in MYD box 

## 2015-11-30 NOTE — Telephone Encounter (Signed)
Pt called and left a message for a refill of Vyvanse. Last OV 06-07-15 Next OV was cancelled 12-08-15 and has not rescheduled. Last written 10-27-15.

## 2015-12-01 NOTE — Telephone Encounter (Signed)
Left detailed msg on VM per HIPAA Rx left in front office for pick up  Also lmovm reminding pt to reschedule annual exam

## 2015-12-08 ENCOUNTER — Encounter: Payer: Medicaid Other | Admitting: Internal Medicine

## 2015-12-17 ENCOUNTER — Encounter: Payer: Self-pay | Admitting: Internal Medicine

## 2016-01-21 ENCOUNTER — Telehealth: Payer: Self-pay

## 2016-01-21 NOTE — Telephone Encounter (Signed)
Labs received from [facility]---A1C 9.6%---Regina wants to know if pt has been taking glipizide BID and prescribed  Left detailed msg on VM per HIPAA Also pt needs to schedule annual exam

## 2016-02-18 ENCOUNTER — Ambulatory Visit (HOSPITAL_COMMUNITY)
Admission: EM | Admit: 2016-02-18 | Discharge: 2016-02-18 | Disposition: A | Discharge status: Home / Self Care | Payer: Medicaid Other

## 2016-04-12 ENCOUNTER — Encounter (HOSPITAL_COMMUNITY): Payer: Self-pay | Admitting: Emergency Medicine

## 2016-04-12 ENCOUNTER — Emergency Department (HOSPITAL_COMMUNITY)
Admission: EM | Admit: 2016-04-12 | Discharge: 2016-04-12 | Disposition: A | Discharge status: Home / Self Care | Payer: No Typology Code available for payment source | Attending: Emergency Medicine | Admitting: Emergency Medicine

## 2016-04-12 ENCOUNTER — Emergency Department (HOSPITAL_COMMUNITY): Payer: No Typology Code available for payment source

## 2016-04-12 DIAGNOSIS — I10 Essential (primary) hypertension: Secondary | ICD-10-CM | POA: Insufficient documentation

## 2016-04-12 DIAGNOSIS — E119 Type 2 diabetes mellitus without complications: Secondary | ICD-10-CM | POA: Diagnosis not present

## 2016-04-12 DIAGNOSIS — G4733 Obstructive sleep apnea (adult) (pediatric): Secondary | ICD-10-CM

## 2016-04-12 DIAGNOSIS — Z7982 Long term (current) use of aspirin: Secondary | ICD-10-CM | POA: Diagnosis not present

## 2016-04-12 DIAGNOSIS — Z79899 Other long term (current) drug therapy: Secondary | ICD-10-CM | POA: Diagnosis not present

## 2016-04-12 DIAGNOSIS — Z8249 Family history of ischemic heart disease and other diseases of the circulatory system: Secondary | ICD-10-CM

## 2016-04-12 DIAGNOSIS — Z9989 Dependence on other enabling machines and devices: Secondary | ICD-10-CM

## 2016-04-12 DIAGNOSIS — R079 Chest pain, unspecified: Secondary | ICD-10-CM

## 2016-04-12 DIAGNOSIS — R0789 Other chest pain: Secondary | ICD-10-CM | POA: Insufficient documentation

## 2016-04-12 LAB — CBC
HCT: 45.8 % (ref 36.0–46.0)
Hemoglobin: 16 g/dL — ABNORMAL HIGH (ref 12.0–15.0)
MCH: 29.2 pg (ref 26.0–34.0)
MCHC: 34.9 g/dL (ref 30.0–36.0)
MCV: 83.6 fL (ref 78.0–100.0)
Platelets: 257 10*3/uL (ref 150–400)
RBC: 5.48 MIL/uL — ABNORMAL HIGH (ref 3.87–5.11)
RDW: 13 % (ref 11.5–15.5)
WBC: 9.8 10*3/uL (ref 4.0–10.5)

## 2016-04-12 LAB — I-STAT TROPONIN, ED
Troponin i, poc: 0 ng/mL (ref 0.00–0.08)
Troponin i, poc: 0 ng/mL (ref 0.00–0.08)

## 2016-04-12 LAB — BASIC METABOLIC PANEL
Anion gap: 14 (ref 5–15)
BUN: 18 mg/dL (ref 6–20)
CO2: 24 mmol/L (ref 22–32)
Calcium: 9.4 mg/dL (ref 8.9–10.3)
Chloride: 97 mmol/L — ABNORMAL LOW (ref 101–111)
Creatinine, Ser: 0.86 mg/dL (ref 0.44–1.00)
GFR calc Af Amer: >60 mL/min (ref >60)
GFR calc non Af Amer: >60 mL/min (ref >60)
Glucose, Bld: 189 mg/dL — ABNORMAL HIGH (ref 65–99)
Potassium: 3 mmol/L — ABNORMAL LOW (ref 3.5–5.1)
Sodium: 135 mmol/L (ref 135–145)

## 2016-04-12 IMAGING — CR DG CHEST 2V
2 series · 2 of 2 positions shown · non-contrast
Comparison: PA and lateral chest x-ray [DATE]

CLINICAL DATA: Pain between the shoulder blades for the past
several days with nausea and vomiting. History of hypertension,
hyperlipidemia, diabetes.

EXAM:
CHEST  2 VIEW

[chest pa]
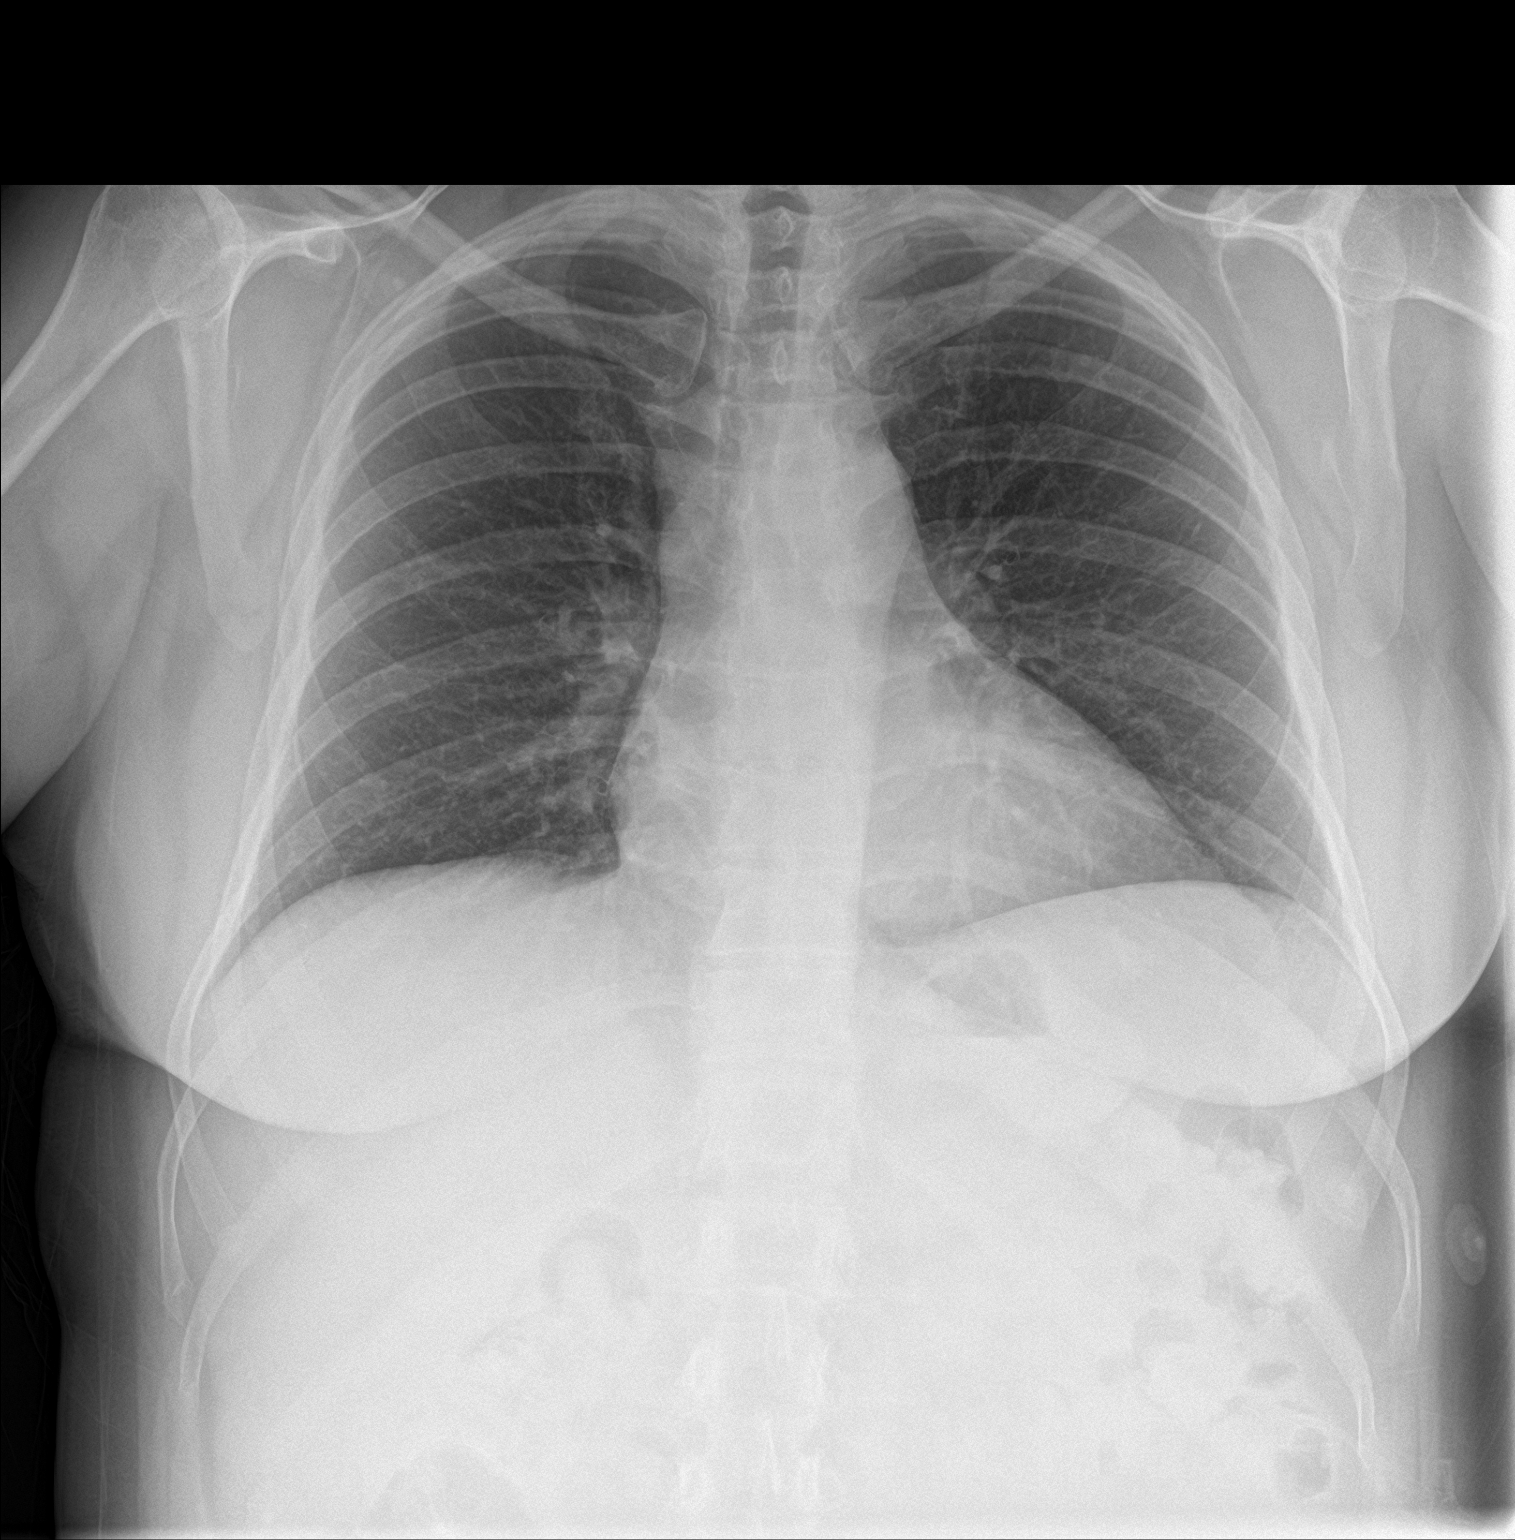

[chest lat]
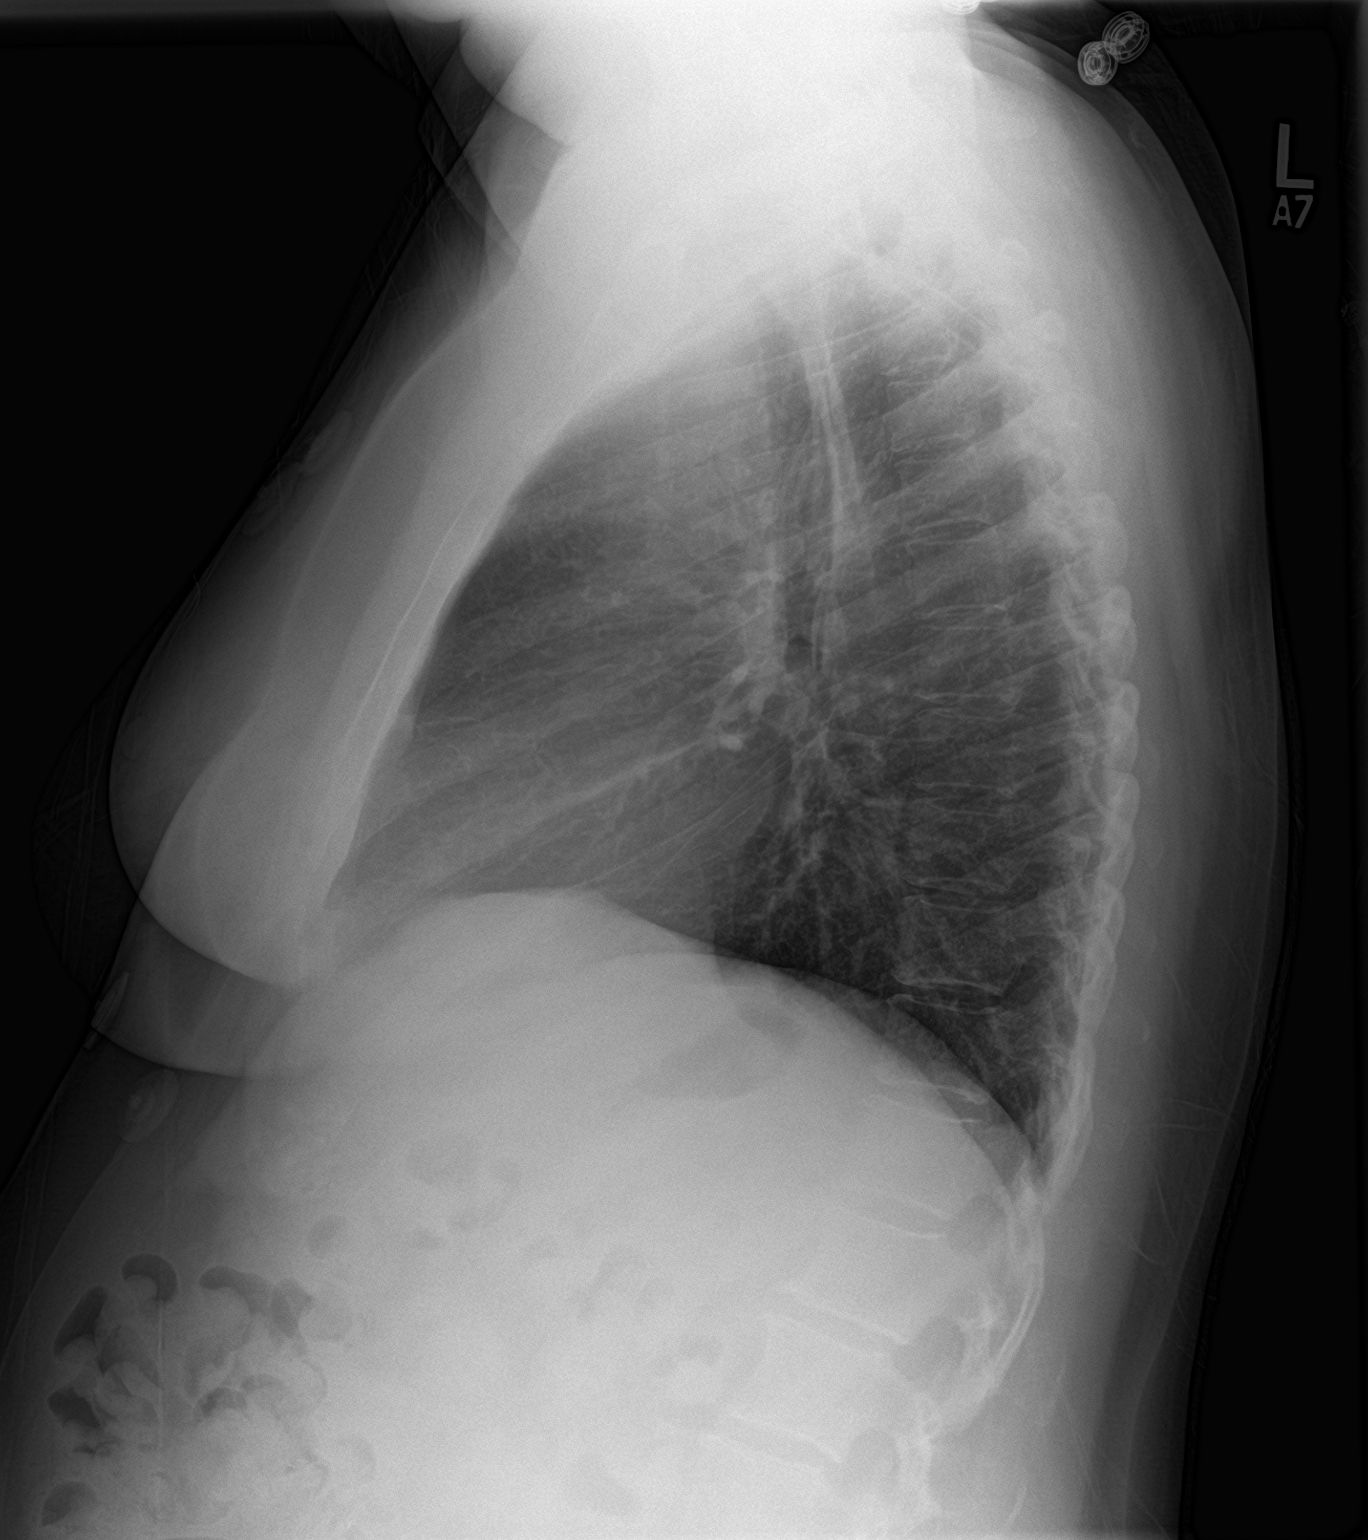

[2 of 2 positions shown; findings below may reference images not displayed]

FINDINGS: The lungs are well-expanded. There is no focal infiltrate. There is
no pleural effusion. The heart and pulmonary vascularity are normal.
The mediastinum is normal in width. There is calcification in the
wall of the aortic arch. The trachea is midline. The bony thorax
exhibits no acute abnormality.
IMPRESSION: There is no acute cardiopulmonary disease.

Thoracic aortic atherosclerosis.

## 2016-04-12 IMAGING — CT CT HEART MORP W/ CTA COR W/ SCORE W/ CA W/CM &/OR W/O CM
1 of 10 series · 1 of 20 positions shown, 2 images · IV contrast (Iodine)
Comparison: None.

CLINICAL DATA: Chest pain

EXAM:
Cardiac CTA
MEDICATIONS:
Sub lingual nitro. 4mg and lopressor 15mg
TECHNIQUE: The patient was scanned on a [employer] [REDACTED]ice scanner. Gantry
rotation speed was 270 msecs. Collimation was .9mm. A 100 kV
prospective scan was triggered in the descending thoracic aorta at
111 HU's with 5% padding centered around 78% of the R-R interval.
Average HR during the scan was bpm. The 3D data set was interpreted
on a dedicated work station using MPR, MIP and VRT modes. A total of
80cc of contrast was used.

[Series 300: locator · axial · 0.35mm/px · z∈[-129,-129]mm · 1 of 1 slices shown, 2 images]
[im 1/1  vessel]
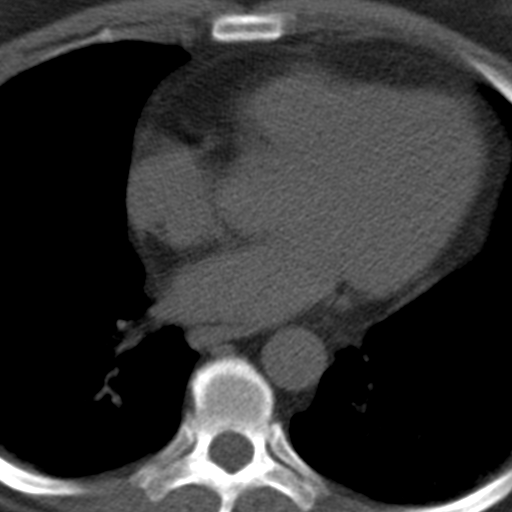
[im 1/1  lung]
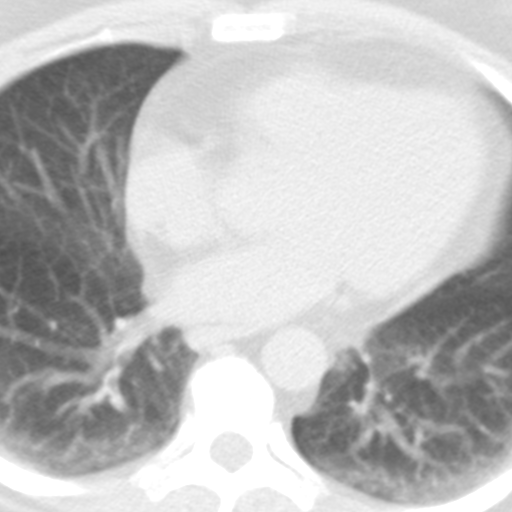

[1 of 20 positions shown; findings below may reference images not displayed]

FINDINGS: Non-cardiac: See separate report from [REDACTED]. No
significant findings on limited lung and soft tissue windows.

Calcium Score:  2 isolated calcium seen in proximal LAD

Coronary Arteries: Right dominant with no anomalies

LM: Normal

LAD: Less than 20% calcified plaque in proximal LAD. Some diffuse
narrowing in mid LAD from myocardial bridge

D1:  Normal

D2: Normal

Circumflex:  Normal

OM1:  Normal

OM2:  Normal

RCA:  Some motion artifact Dominant normal

PDA:  Normal

PLA:  Normal
IMPRESSION: 1) Essentially normal right dominant coronary arteries. LAD with
isolated less than 20% calcified plaque and mid vessel bridging

2) Calcium Score of 2 which is 89th percentile for age and sex
matched controls

3) See radiology addendum regarding low risk pulmonary nodule

DINIS FILIPE

EXAM:
OVER-READ INTERPRETATION  CT CHEST

The following report is an over-read performed by radiologist Dr.
over-read does not include interpretation of cardiac or coronary
anatomy or pathology. The coronary calcium score/coronary CTA
interpretation by the cardiologist is attached.
FINDINGS: 4 mm left lower lobe pulmonary nodule (image 27 of series 204).
Within the visualized portions of the thorax there are no larger
more suspicious appearing pulmonary nodules or masses. There is also
no acute consolidative airspace disease, pleural effusions,
pneumothorax or lymphadenopathy. Visualized portions of the upper
abdomen demonstrate diffuse low attenuation throughout the
visualized portions of the hepatic parenchyma, compatible with
hepatic steatosis. There are no aggressive appearing lytic or
blastic lesions noted in the visualized portions of the skeleton.
IMPRESSION: 1. 4 mm left lower lobe pulmonary nodule. This is nonspecific and
statistically likely benign. No follow-up needed if patient is
low-risk. Non-contrast chest CT can be considered in 12 months if
patient is high-risk. This recommendation follows the consensus
statement: Guidelines for Management of Incidental Pulmonary Nodules
Detected on CT Images: From the [HOSPITAL] [QR]; Radiology
2. Hepatic steatosis.

## 2016-04-12 MED ORDER — NITROGLYCERIN 0.4 MG SL SUBL
0.4000 mg | SUBLINGUAL_TABLET | SUBLINGUAL | Status: DC | PRN
  Administered 2016-04-12: 0.4 mg via SUBLINGUAL
  Filled 2016-04-12: qty 1

## 2016-04-12 MED ORDER — ASPIRIN 81 MG PO CHEW: 324.0000 mg | CHEWABLE_TABLET | Freq: Once | ORAL | Status: DC

## 2016-04-12 MED ORDER — ASPIRIN 81 MG PO CHEW
162.0000 mg | CHEWABLE_TABLET | Freq: Once | ORAL | Status: AC
  Administered 2016-04-12: 162 mg via ORAL
  Filled 2016-04-12: qty 2

## 2016-04-12 MED ORDER — POTASSIUM CHLORIDE CRYS ER 20 MEQ PO TBCR
40.0000 meq | EXTENDED_RELEASE_TABLET | Freq: Once | ORAL | Status: AC
  Administered 2016-04-12: 40 meq via ORAL
  Filled 2016-04-12: qty 2

## 2016-04-12 MED ORDER — METOPROLOL TARTRATE 5 MG/5ML IV SOLN
5.0000 mg | INTRAVENOUS | Status: AC | PRN
  Administered 2016-04-12 (×3): 5 mg via INTRAVENOUS
  Filled 2016-04-12: qty 5

## 2016-04-12 MED ORDER — IOPAMIDOL (ISOVUE-370) INJECTION 76%
INTRAVENOUS | Status: AC
  Administered 2016-04-12: 80 mL
  Filled 2016-04-12: qty 100

## 2016-04-12 NOTE — ED Notes (Signed)
Cardiology at bedside.

## 2016-04-12 NOTE — ED Provider Notes (Signed)
[facility] Provider Note   CSN: [id number] Arrival date & time: 04/12/16  0730     History   Chief Complaint Chief Complaint  Patient presents with  . Chest Pain    HPI Lindsay Rios is a 51 y.o. female.  Patient with PMH of DM, HTN, HL, strong family hx of heart disease presents to the emergency department with chief complaint of chest pain and back pain. She states that she has had the implant was intermittently since Sunday. She reports some associated nausea and vomiting. She describes the symptoms as a pressure in her chest.  She rates her symptoms as a 4 out of 10. She states that the pain radiates to her jaw. She denies any shortness of breath this time, but states that she does have some in her symptoms with exertion.   The history is provided by the patient. No language interpreter was used.    Past Medical History:  Diagnosis Date  . ADD (attention deficit disorder)   . Allergy   . Depression   . Ejection fraction   . Gestational diabetes 1999; 2002  . History of chicken pox   . 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 Hyperlipidemia   . Hypertension   . Numbness and tingling   . Osteopenia   . Pneumonia 2012  . Type II diabetes mellitus ([place])    "borderline; I'm on Metformin"    Patient Active Problem List   Diagnosis Date Noted  . Vitamin D deficiency 06/07/2015  . Ulnar neuropathy at elbow of left upper extremity 05/25/2015  . Constipation   . Spondylosis, cervical, with myelopathy 01/11/2015  . HTN (hypertension) 09/09/2014  . Plantar fasciitis, bilateral 09/09/2014  . Hyperlipidemia 02/22/2014  . DM type 2 (diabetes mellitus, type 2) ([place]) 02/09/2014  . Depression 06/14/2013  . Concentration deficit 06/02/2012  . Allergic rhinitis 09/25/2006    Past Surgical History:  Procedure Laterality Date  . ANAL RECTAL MANOMETRY N/A 03/17/2015   Procedure: ANO RECTAL MANOMETRY;  Surgeon: Mauri Pole, MD;  Location: [facility] ENDOSCOPY;   Service: Endoscopy;  Laterality: N/A;  . LEFT HEART CATHETERIZATION WITH CORONARY ANGIOGRAM N/A 12/22/2013   Procedure: LEFT HEART CATHETERIZATION WITH CORONARY ANGIOGRAM;  Surgeon: Jettie Booze, MD;  Location: [facility] CATH LAB;  Service: Cardiovascular;  Laterality: N/A;  . SHOULDER ARTHROSCOPY W/ ROTATOR CUFF REPAIR Right 1990's  . VAGINAL HYSTERECTOMY  2013   HPV/Cervical Dysplasia; partial    OB History    No data available       Home Medications    Prior to Admission medications   Medication Sig Start Date End Date Taking? Authorizing Provider  aspirin 81 MG chewable tablet Chew 81 mg by mouth daily.     Historical Provider, MD  FLUoxetine (PROZAC) 40 MG capsule TAKE ONE CAPSULE BY MOUTH DAILY 07/09/15   [name], NP  glipiZIDE (GLUCOTROL) 10 MG tablet TAKE ONE TABLET BY MOUTH TWO TIMES DAILYBEFORE A MEAL 10/22/15   [name], NP  hydrochlorothiazide (HYDRODIURIL) 25 MG tablet Take 1 tablet (25 mg total) by mouth daily. 05/25/15   Marcial Pacas, MD  lisdexamfetamine (VYVANSE) 20 MG capsule Take 1 capsule (20 mg total) by mouth daily. 11/30/15   [name], NP  lisinopril (PRINIVIL,ZESTRIL) 20 MG tablet Take 1 tablet (20 mg total) by mouth daily. 05/25/15   Marcial Pacas, MD  meloxicam (MOBIC) 15 MG tablet Take 1 tablet (15 mg total) by mouth daily.  05/25/15   Marcial Pacas, MD  nitroGLYCERIN (NITROSTAT) 0.4 MG SL tablet Place 1 tablet (0.4 mg total) under the tongue every 5 (five) minutes as needed for chest pain. 12/20/13   Lendon Colonel, NP  simvastatin (ZOCOR) 40 MG tablet Take 1 tablet (40 mg total) by mouth at bedtime. 05/25/15   Marcial Pacas, MD  Vitamin D, Ergocalciferol, (DRISDOL) 50000 units CAPS capsule Take 1 capsule (50,000 Units total) by mouth every 7 (seven) days. 06/11/15   [name], NP    Family History Family History  Problem Relation Age of Onset  . Hyperlipidemia Mother   . Hyperlipidemia Father   . Hypertension Father   . Diabetes Father   . Heart  disease Father   . Stroke Father   . Cancer Maternal Grandmother     Colon Cancer  . Alzheimer's disease Maternal Grandmother   . Heart disease Brother 37    Myocardial Infarction    Social History Social History  Substance Use Topics  . Smoking status: Never Smoker  . Smokeless tobacco: Never Used  . Alcohol use No     Comment: 12/19/2013 "might have a drink a couple times/year"     Allergies   Sulfa antibiotics   Review of Systems Review of Systems  Respiratory: Negative for shortness of breath.   Cardiovascular: Positive for chest pain.  Gastrointestinal: Positive for nausea and vomiting.  All other systems reviewed and are negative.    Physical Exam Updated Vital Signs BP 118/82 (BP Location: Right Arm)   Pulse 88   Temp 98.7 F (37.1 C) (Oral)   Resp 14   Ht 5' 2.5" (1.588 m)   Wt 77.1 kg   SpO2 94%   BMI 30.60 kg/m   Physical Exam  Constitutional: She is oriented to person, place, and time. She appears well-developed and well-nourished.  HENT:  Head: Normocephalic and atraumatic.  Eyes: Conjunctivae and EOM are normal. Pupils are equal, round, and reactive to light.  Neck: Normal range of motion. Neck supple.  Cardiovascular: Normal rate and regular rhythm.  Exam reveals no gallop and no friction rub.   No murmur heard. Pulmonary/Chest: Effort normal and breath sounds normal. No respiratory distress. She has no wheezes. She has no rales. She exhibits no tenderness.  Abdominal: Soft. Bowel sounds are normal. She exhibits no distension and no mass. There is no tenderness. There is no rebound and no guarding.  Musculoskeletal: Normal range of motion. She exhibits no edema or tenderness.  Neurological: She is alert and oriented to person, place, and time.  Skin: Skin is warm and dry.  Psychiatric: She has a normal mood and affect. Her behavior is normal. Judgment and thought content normal.  Nursing note and vitals reviewed.    ED Treatments / Results    Labs (all labs ordered are listed, but only abnormal results are displayed) Labs Reviewed  BASIC METABOLIC PANEL - Abnormal; Notable for the following:       Result Value   Potassium 3.0 (*)    Chloride 97 (*)    Glucose, Bld 189 (*)    All other components within normal limits  CBC - Abnormal; Notable for the following:    RBC 5.48 (*)    Hemoglobin 16.0 (*)    All other components within normal limits  Randolm Idol, ED  Randolm Idol, ED    EKG  EKG Interpretation  Date/Time:  Wednesday April 12 2016 07:40:40 EDT Ventricular Rate:  90 PR Interval:  QRS Duration: 93 QT Interval:  383 QTC Calculation: 469 R Axis:   59 Text Interpretation:  Sinus rhythm No significant change since last tracing Confirmed by Winfred Leeds  MD, SAM [phone]) on 04/12/2016 7:54:42 AM       Radiology Dg Chest 2 View  Result Date: 04/12/2016 CLINICAL DATA:  Pain between the shoulder blades for the past several days with nausea and vomiting. History of hypertension, hyperlipidemia, diabetes. EXAM: CHEST  2 VIEW COMPARISON:  PA and lateral chest x-ray of December 19, 2013 FINDINGS: The lungs are well-expanded. There is no focal infiltrate. There is no pleural effusion. The heart and pulmonary vascularity are normal. The mediastinum is normal in width. There is calcification in the wall of the aortic arch. The trachea is midline. The bony thorax exhibits no acute abnormality. IMPRESSION: There is no acute cardiopulmonary disease. Thoracic aortic atherosclerosis. Electronically Signed   By: David  Martinique M.D.   On: 04/12/2016 08:30    Procedures Procedures (including critical care time)  Medications Ordered in ED Medications - No data to display   Initial Impression / Assessment and Plan / ED Course  I have reviewed the triage vital signs and the nursing notes.  Pertinent labs & imaging results that were available during my care of the patient were reviewed by me and considered in my medical  decision making (see chart for details).     Patient with multiple risk factors for heart disease, presents with intermittent chest pain that radiates to her back and jaw. Symptoms have been gradually worsening since Sunday.  She denies any history of PE or DVT. She is not tachycardic. She denies any recent long travel. Denies any lower extremity swelling. Will check labs, chest x-ray, EKG, and will reassess.  Laboratory workup is reassuring. Patient seen by and discussed with Dr. Cathleen Fears, who recommends cardiology consultation.  Plan for admission by cardiology.  Final Clinical Impressions(s) / ED Diagnoses   Final diagnoses:  Exertional chest pain    New Prescriptions New Prescriptions   No medications on file     Montine Circle, PA-C 04/12/16 [place], MD 04/12/16 [phone]

## 2016-04-12 NOTE — ED Triage Notes (Signed)
Pt arrives from home via POV reporting jaw, upper back pain with N/V intermittently since Sunday.  Pt describes pain as "numbness and pressure".  Pt reports strong family hx of early MI.  Resp e/u, NAD noted at this time, pain 4/10.

## 2016-04-12 NOTE — ED Notes (Signed)
Patient able to ambulate independently  

## 2016-04-12 NOTE — Progress Notes (Addendum)
   CCTA findings:  1) Essentially normal right dominant coronary arteries. LAD with isolated less than 20% calcified plaque and mid vessel bridging  2) Calcium Score of 2 which is 89th percentile for age and sex matched controls  3) See radiology addendum regarding low risk pulmonary nodule  Her discomfort does not appear to be cardiac origin. Findings discussed with patient. She is encouraged to focus on risk factor reduction: BP control, Diabetes control, Cholesterol management with statin, Avoid tobacco use (fiance not to smoke around her), heart healthy diet and exercise.  She is OK to be sent home.   Cardiology follow up appointment arranged with Dr. Kyla Balzarine NP, Nell Range, for 04/20/2016 at 3:30 pm.  Daune Perch, NP

## 2016-04-12 NOTE — ED Provider Notes (Addendum)
Complains of anterior chest pain radiating to her jaw onset 2 days ago, pain is intermittent and lasts 1-2 hours at a time improved after treatment with aspirin. She is presently asymptomatic. Pain is brought on by stress. Nonexertional. Similar to pain she's had in the past. 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 On exam no distress lungs clear auscultation heart regular rate and rhythm no murmurs abdomen nondistended nontender extremities without edema   Orlie Dakin, MD 04/12/16 1206 Addendum cardiology consult reviewed. I spoke with cardiology staff who suggested the patient can be discharged home with close follow-up given cardiac CT performed today   Orlie Dakin, MD 04/12/16 [phone]

## 2016-04-12 NOTE — ED Notes (Signed)
Cardiologist at bedside.  

## 2016-04-12 NOTE — ED Notes (Signed)
ED Provider at bedside. 

## 2016-04-12 NOTE — Discharge Instructions (Signed)
Keep your scheduled appointment at [facility] health heart care office

## 2016-04-12 NOTE — H&P (Signed)
History & Physical    Patient ID: Lindsay Rios MRN: [id number], DOB/AGE: 1965-09-10   Admit date: 04/12/2016   Primary Physician: Webb Silversmith, NP Primary Cardiologist: Was Dr Ron Parker, New Dr. Johnsie Cancel   History of Present Illness    Lindsay Rios is a 51 y.o. female with past medical history of Hypertension, hyperlipidemia, Type 2 diabetes, and depression who is being evaluated for chest pain.  She developed a sharp pain between her shoulder blades and dull mid chest pressure intermittent and lasting for one to 2 hours and radiating to the jaw since Saturday night. It is associated with only mild shortness of breath that she attributes more to the anxiety of the pressure. She has mild exertional dyspnea with walking up one flight of stairs that has been unchanged for the last 4-5 years. The pain is relieved with 2 low-dose aspirin. She had similar pain in 2015 at which time she had a cardiac catheterization which showed only mild nonobstructive disease in the LAD and her chest pain was attributed to stress.  Troponins are negative. EKG is without acute ischemic changes. Her creatinine level is 0.86. Chest x-ray shows no acute cardiopulmonary disease and thoracic aortic atherosclerosis.  Cardiac cath on 01/21/2014 showed   1. Patent coronary arteries with nonobstructive stenosis of the mid-LAD, minimal plaque in the proximal RCA, and otherwise no disease identified. 2. Normal LV systolic function Recommendations: Suspect noncardiac chest pain. Risk reduction measures  Echocardiogram in 2015 showed normal LV systolic function, but was unable to evaluate for wall motion abnormalities  Past Medical History    Past Medical History:  Diagnosis Date  . ADD (attention deficit disorder)   . Allergy   . Depression   . Ejection fraction   . Gestational diabetes 1999; 2002  . History of chicken pox   . 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 Hyperlipidemia   .  Hypertension   . Numbness and tingling   . Osteopenia   . Pneumonia 2012  . Type II diabetes mellitus ([place])    "borderline; I'm on Metformin"    Past Surgical History:  Procedure Laterality Date  . ANAL RECTAL MANOMETRY N/A 03/17/2015   Procedure: ANO RECTAL MANOMETRY;  Surgeon: Mauri Pole, MD;  Location: [facility];  Service: Endoscopy;  Laterality: N/A;  . LEFT HEART CATHETERIZATION WITH CORONARY ANGIOGRAM N/A 12/22/2013   Procedure: LEFT HEART CATHETERIZATION WITH CORONARY ANGIOGRAM;  Surgeon: Jettie Booze, MD;  Location: [facility] CATH LAB;  Service: Cardiovascular;  Laterality: N/A;  . SHOULDER ARTHROSCOPY W/ ROTATOR CUFF REPAIR Right 1990's  . VAGINAL HYSTERECTOMY  2013   HPV/Cervical Dysplasia; partial     Allergies  Allergies  Allergen Reactions  . Sulfa Antibiotics Nausea Only    REACTION: nausea     Home Medications    Prior to Admission medications   Medication Sig Start Date End Date Taking? Authorizing Provider  aspirin 81 MG chewable tablet Chew 81 mg by mouth daily.    Yes Historical Provider, MD  atorvastatin (LIPITOR) 80 MG tablet Take 80 mg by mouth daily.   Yes Historical Provider, MD  cetirizine (ZYRTEC) 10 MG tablet Take 10 mg by mouth daily.   Yes Historical Provider, MD  Dulaglutide (TRULICITY) 1.5 NF/6.2ZH SOPN Inject 0.5 mLs into the skin once a week.   Yes Historical Provider, MD  hydrochlorothiazide (HYDRODIURIL) 25 MG tablet Take 1 tablet (25 mg total) by mouth daily. 05/25/15  Yes Marcial Pacas,  MD  lisdexamfetamine (VYVANSE) 20 MG capsule Take 1 capsule (20 mg total) by mouth daily. 11/30/15  Yes [name], NP  lisinopril (PRINIVIL,ZESTRIL) 20 MG tablet Take 1 tablet (20 mg total) by mouth daily. 05/25/15  Yes Marcial Pacas, MD  nitroGLYCERIN (NITROSTAT) 0.4 MG SL tablet Place 1 tablet (0.4 mg total) under the tongue every 5 (five) minutes as needed for chest pain. 12/20/13  Yes Lendon Colonel, NP  FLUoxetine (PROZAC) 40 MG capsule TAKE ONE  CAPSULE BY MOUTH DAILY Patient not taking: Reported on 04/12/2016 07/09/15   [name], NP  glipiZIDE (GLUCOTROL) 10 MG tablet TAKE ONE TABLET BY MOUTH TWO TIMES DAILYBEFORE A MEAL Patient not taking: Reported on 04/12/2016 10/22/15   [name], NP  meloxicam (MOBIC) 15 MG tablet Take 1 tablet (15 mg total) by mouth daily. Patient not taking: Reported on 04/12/2016 05/25/15   Marcial Pacas, MD  simvastatin (ZOCOR) 40 MG tablet Take 1 tablet (40 mg total) by mouth at bedtime. Patient not taking: Reported on 04/12/2016 05/25/15   Marcial Pacas, MD  Vitamin D, Ergocalciferol, (DRISDOL) 50000 units CAPS capsule Take 1 capsule (50,000 Units total) by mouth every 7 (seven) days. Patient not taking: Reported on 04/12/2016 06/11/15   [name], NP    Family History    Family History  Problem Relation Age of Onset  . Hyperlipidemia Mother   . Hyperlipidemia Father   . Hypertension Father   . Diabetes Father   . Heart disease Father   . Stroke Father   . Cancer Maternal Grandmother     Colon Cancer  . Alzheimer's disease Maternal Grandmother   . Heart disease Brother 36    Myocardial Infarction    Social History    Social History   Social History  . Marital status: Divorced    Spouse name: [name] (Separated)   . Number of children: 2  . Years of education: 14   Occupational History  . 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 Smoking status: Never Smoker  . Smokeless tobacco: Never Used  . Alcohol use No     Comment: 12/19/2013 "might have a drink a couple times/year"  . Drug use: No  . Sexual activity: Not Currently    Partners: Male   Other Topics Concern  . Not on file   Social History Narrative   Marital Status: Separated [name]) Significant Other [name])    Children:  Son (1) Daughter (1)    Pets: Dogs (2), Cat (1)    Living Situation: Lives with children.   Occupation: Full- Time Student    Education: [employer] ([place])    Tobacco Use: She has never smoked.     Alcohol Use:  Rarely   Drug Use:  None   Diet:  Regular   Exercise:  None   Hobbies: Shopping           Review of Systems   General:  No chills, fever, night sweats or weight changes.  Cardiovascular:  Positive for intermittent chest pain, mild dyspnea on exertion with climbing one flight of stairs unchanged over the last 4-5 years, no edema, orthopnea, palpitations, paroxysmal nocturnal dyspnea. Dermatological: No rash, lesions/masses Respiratory: No cough, dyspnea Urologic: No hematuria, dysuria Abdominal:   No nausea, vomiting, diarrhea, bright red blood per rectum, melena, or hematemesis Neurologic:  No visual changes, wkns, changes in mental  status. All other systems reviewed and are otherwise negative except as noted above.  Physical Exam   Blood pressure 124/89, pulse 90, temperature 98.7 F (37.1 C), temperature source Oral, resp. rate 17, height 5' 2.5" (1.588 m), weight 170 lb (77.1 kg), SpO2 94 %.  General: Well developed, well nourished,female in no acute distress. Head: Normocephalic, atraumatic, sclera non-icteric, no xanthomas, nares are without discharge. Dentition:  Neck: No carotid bruits. JVD not elevated.  Lungs: Respirations regular and unlabored, without wheezes or rales.  Heart: Regular rate and rhythm. No S3 or S4.  No murmur, no rubs, or gallops appreciated. Abdomen: Soft, non-tender, non-distended with normoactive bowel sounds. No hepatomegaly. No rebound/guarding. No obvious abdominal masses. Msk:  Strength and tone appear normal for age. No joint deformities or effusions. Extremities: No clubbing or cyanosis. No edema.  Distal pedal pulses are 2+ bilaterally. Neuro: Alert and oriented X 3. Moves all extremities spontaneously. No focal deficits noted. Psych:  Responds to questions appropriately with a normal affect. Skin: No rashes or lesions noted  Labs    Troponin (Point of Care Test)  Recent  Labs  04/12/16 1117  TROPIPOC 0.00   No results for input(s): CKTOTAL, CKMB, TROPONINI in the last 72 hours. Lab Results  Component Value Date   WBC 9.8 04/12/2016   HGB 16.0 (H) 04/12/2016   HCT 45.8 04/12/2016   MCV 83.6 04/12/2016   PLT 257 04/12/2016     Recent Labs Lab 04/12/16 0757  NA 135  K 3.0*  CL 97*  CO2 24  BUN 18  CREATININE 0.86  CALCIUM 9.4  GLUCOSE 189*   Lab Results  Component Value Date   CHOL 208 (H) 06/07/2015   HDL 48.70 06/07/2015   LDLCALC 128 (H) 06/07/2015   TRIG 155.0 (H) 06/07/2015   Lab Results  Component Value Date   DDIMER  08/20/2006    <0.22        AT THE INHOUSE ESTABLISHED CUTOFF VALUE OF 0.48 ug/mL FEU, THIS ASSAY HAS BEEN DOCUMENTED IN THE LITERATURE TO HAVE    No results found for: BNP No results found for: PROBNP No results for input(s): INR in the last 72 hours.    Radiology Studies    Dg Chest 2 View  Result Date: 04/12/2016 CLINICAL DATA:  Pain between the shoulder blades for the past several days with nausea and vomiting. History of hypertension, hyperlipidemia, diabetes. EXAM: CHEST  2 VIEW COMPARISON:  PA and lateral chest x-ray of December 19, 2013 FINDINGS: The lungs are well-expanded. There is no focal infiltrate. There is no pleural effusion. The heart and pulmonary vascularity are normal. The mediastinum is normal in width. There is calcification in the wall of the aortic arch. The trachea is midline. The bony thorax exhibits no acute abnormality. IMPRESSION: There is no acute cardiopulmonary disease. Thoracic aortic atherosclerosis. Electronically Signed   By: David  Martinique M.D.   On: 04/12/2016 08:30    EKG & Cardiac Imaging    EKG: Normal sinus rhythm at 90 bpm with no acute ischemic changes  ECHOCARDIOGRAM: 12/20/2013  Study Conclusions - Left ventricle: The cavity size was normal. Systolic function was normal. The estimated ejection fraction was in the range of 50% to 55%. Regional wall motion  abnormalities cannot be excluded. ____________________________________________________________________  Cardiac cath 12/22/2013 Final Conclusions:    1. Patent coronary arteries with nonobstructive stenosis of the mid-LAD, minimal plaque in the proximal RCA, and otherwise no disease identified. 2. Normal LV systolic function Recommendations:  Suspect noncardiac chest pain. Risk reduction measures  Assessment & Plan    Active Problems:   Chest pain radiating to jaw   OSA on CPAP   Family history of early CAD  1. Atypical chest pain  -Her pain is primarily sharp in between her shoulder blades with mild anterior chest pressure, intermittent and lasting for one to 2 hours and radiating to the jaw. No significant dyspnea. These symptoms are similar to an episode of chest pain in 2015 at which time a cardiac catheterization revealed only mild nonobstructive disease in the LAD and the CP was attributed to stress.  -She has significant risk factors including hypertension, hyperlipidemia, diabetes, strong family history of early CAD, and obstructive sleep apnea -first 2 troponins are negative and EKG is without any concerning ischemic changes -Will attempt coronary CTA with calcium score and if normal, can discharge later today.  -Continue aspirin and statin  2. HLD -Last lipid panel in [facility], 06/07/15, TC 208, LDL 128, HDL 48.7, Trig 155. -Her atorvastatin was recently increased to 80 mg by her PCP. Continue  3. HTN -Home meds include lisinopril and hydrochlorothiazide. Continue  4. Diabetes -Poorly controlled with last A1c 9.6 in 10/2015 -Treated with glipizide, trulicity. Managed by PCP. Should follow up for medication alterations.   5. OSA -Uses her CPAP inconsistently. Advised on improved compliance.    Tildon Husky, NP-C 04/12/2016, 2:35 PM Pager: [phone]  Patient examined chart reviewed. Exam benign Had jaw pain with ambulation to bathroom. Previous cath 2015 Dr  Burt Knack reviewed and no significant disease ECG is normal and enzymes negative. Discussed options with patient Favor cardiac CTA to further risk stratify. Discussed need for 18 g anticubital iv and iv lopressor . Order written and discussed with CT. Will do when HR down to 60 range. CTA should r/o significant CAD Somewhat less sensitive to coronary artery dissection but patient has negative enzymes and no ECG changes  Jenkins Rouge

## 2016-04-20 ENCOUNTER — Ambulatory Visit: Payer: PRIVATE HEALTH INSURANCE | Admitting: Physician Assistant

## 2016-04-20 ENCOUNTER — Encounter: Payer: Self-pay | Admitting: *Deleted

## 2016-05-02 ENCOUNTER — Encounter: Payer: Self-pay | Admitting: Cardiology

## 2016-05-02 ENCOUNTER — Ambulatory Visit (INDEPENDENT_AMBULATORY_CARE_PROVIDER_SITE_OTHER): Payer: PRIVATE HEALTH INSURANCE | Admitting: Cardiology

## 2016-05-02 VITALS — BP 128/82 | HR 89 | Ht 62.0 in | Wt 166.0 lb

## 2016-05-02 DIAGNOSIS — R911 Solitary pulmonary nodule: Secondary | ICD-10-CM

## 2016-05-02 DIAGNOSIS — R0789 Other chest pain: Secondary | ICD-10-CM

## 2016-05-02 NOTE — Patient Instructions (Signed)
Medication Instructions:  Your physician recommends that you continue on your current medications as directed. Please refer to the Current Medication list given to you today.   Labwork: None ordered  Testing/Procedures: None ordered  Follow-Up: Your physician wants you to follow-up in: [place] Blima Singer will receive a reminder letter in the mail two months in advance. If you don't receive a letter, please call our office to schedule the follow-up appointment.   Any Other Special Instructions Will Be Listed Below (If Applicable).     If you need a refill on your cardiac medications before your next appointment, please call your pharmacy.

## 2016-05-02 NOTE — Progress Notes (Signed)
05/02/2016 SRA[facility] [name]   Dec 23, 1965  [id number]  Primary Physician [place], PA-C Primary Cardiologist: Dr. Johnsie Cancel    Reason for Visit/CC: Post ED f/u for Chest Pain   HPI:  Mr. Lindsay Rios presents to clinic today for post ED f/u after being evaluated for CP. She is a 51 y.o. female with past medical history of Hypertension, hyperlipidemia, Type 2 diabetes, and depression.  She presented to the ED on 04/12/16 with CC of chest pain. Per ED records, She developed a sharp pain between her shoulder blades and dull mid chest pressure intermittent and lasting for one to 2 hours and radiating to the jaw since Saturday night. It is associated with only mild shortness of breath that she attributes more to the anxiety of the pressure. She has mild exertional dyspnea with walking up one flight of stairs that has been unchanged for the last 4-5 years. The pain is relieved with 2 low-dose aspirin. She had similar pain in 2015 at which time she had a cardiac catheterization which showed only mild nonobstructive disease in the LAD and her chest pain was attributed to stress.  She was seen in the ED by Dr. Johnsie Cancel for cardiac consultation. Troponins were negative x 2. EKG w/o ischemic changes. Chest x-ray shows no acute cardiopulmonary disease and thoracic aortic atherosclerosis. Coronary CT showed a calcium score of 2. Essentially normal right dominant coronary arteries. LAD with isolated less than 20% calcified plaque and mid vessel bridging. The radiologist interpretation of study also felt low risk for PE. She was however noted to have a 4 mm LLL pulmonary nodule. Based on her Coronary CT findings, no further w/u was advised.   Someone the patient was set up for cardiac f/u. She seems to think her CP is anxiety related. She often develops symptoms when she is stressed/ anxious. She is asking about medication for this. She is currently CP free.     Current Meds  Medication Sig  . aspirin 81  MG chewable tablet Chew 81 mg by mouth daily.   Marland Kitchen atorvastatin (LIPITOR) 80 MG tablet Take 80 mg by mouth daily.  . cetirizine (ZYRTEC) 10 MG tablet Take 10 mg by mouth daily.  . Dulaglutide (TRULICITY) 1.5 CH/8.8FO SOPN Inject 0.5 mLs into the skin once a week.  . hydrochlorothiazide (HYDRODIURIL) 25 MG tablet Take 1 tablet (25 mg total) by mouth daily.  Marland Kitchen lisdexamfetamine (VYVANSE) 20 MG capsule Take 1 capsule (20 mg total) by mouth daily.  Marland Kitchen lisinopril (PRINIVIL,ZESTRIL) 20 MG tablet Take 1 tablet (20 mg total) by mouth daily.  . nitroGLYCERIN (NITROSTAT) 0.4 MG SL tablet Place 1 tablet (0.4 mg total) under the tongue every 5 (five) minutes as needed for chest pain.   Allergies  Allergen Reactions  . Sulfa Antibiotics Nausea Only    REACTION: nausea   Past Medical History:  Diagnosis Date  . ADD (attention deficit disorder)   . Allergy   . Depression   . Ejection fraction   . Gestational diabetes 1999; 2002  . History of chicken pox   . 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 Hyperlipidemia   . Hypertension   . Numbness and tingling   . Osteopenia   . Pneumonia 2012  . Type II diabetes mellitus ([place])    "borderline; I'm on Metformin"   Family History  Problem Relation Age of Onset  . Hyperlipidemia Mother   . Hyperlipidemia Father   . Hypertension Father   . Diabetes  Father   . Heart disease Father   . Stroke Father   . Cancer Maternal Grandmother     Colon Cancer  . Alzheimer's disease Maternal Grandmother   . Heart disease Brother 40    Myocardial Infarction   Past Surgical History:  Procedure Laterality Date  . ANAL RECTAL MANOMETRY N/A 03/17/2015   Procedure: ANO RECTAL MANOMETRY;  Surgeon: Mauri Pole, MD;  Location: [facility];  Service: Endoscopy;  Laterality: N/A;  . LEFT HEART CATHETERIZATION WITH CORONARY ANGIOGRAM N/A 12/22/2013   Procedure: LEFT HEART CATHETERIZATION WITH CORONARY ANGIOGRAM;  Surgeon: Jettie Booze, MD;  Location: [facility] CATH  LAB;  Service: Cardiovascular;  Laterality: N/A;  . SHOULDER ARTHROSCOPY W/ ROTATOR CUFF REPAIR Right 1990's  . VAGINAL HYSTERECTOMY  2013   HPV/Cervical Dysplasia; partial   Social History   Social History  . Marital status: Divorced    Spouse name: [name] (Separated)   . Number of children: 2  . Years of education: 14   Occupational History  . 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 Smoking status: Never Smoker  . Smokeless tobacco: Never Used  . Alcohol use No     Comment: 12/19/2013 "might have a drink a couple times/year"  . Drug use: No  . Sexual activity: Not Currently    Partners: Male   Other Topics Concern  . Not on file   Social History Narrative   Marital Status: Separated [name]) Significant Other [name])    Children:  Son (1) Daughter (1)    Pets: Dogs (2), Cat (1)    Living Situation: Lives with children.   Occupation: Full- Time Student    Education: [employer] ([place])    Tobacco Use: She has never smoked.     Alcohol Use:  Rarely   Drug Use:  None   Diet:  Regular   Exercise:  None   Hobbies: Shopping           Review of Systems: General: negative for chills, fever, night sweats or weight changes.  Cardiovascular: negative for chest pain, dyspnea on exertion, edema, orthopnea, palpitations, paroxysmal nocturnal dyspnea or shortness of breath Dermatological: negative for rash Respiratory: negative for cough or wheezing Urologic: negative for hematuria Abdominal: negative for nausea, vomiting, diarrhea, bright red blood per rectum, melena, or hematemesis Neurologic: negative for visual changes, syncope, or dizziness All other systems reviewed and are otherwise negative except as noted above.   Physical Exam:  Blood pressure 128/82, pulse 89, height 5\' 2"  (1.575 m), weight 166 lb (75.3 kg), SpO2 98 %.  General appearance: alert, cooperative and no distress Neck: no carotid bruit and no  JVD Lungs: clear to auscultation bilaterally Heart: regular rate and rhythm, S1, S2 normal, no murmur, click, rub or gallop Extremities: extremities normal, atraumatic, no cyanosis or edema Pulses: 2+ and symmetric Skin: Skin color, texture, turgor normal. No rashes or lesions Neurologic: Grossly normal  EKG not performed.   ASSESSMENT AND PLAN:   1. Noncardiac Chest Pain: Pt had LHC in 2015 with Dr. Burt Knack that showed no significant disease. Recent coronary CT 04/12/16 reassuring with calcium score of 2. Essentially normal right dominant coronary arteries. LAD with isolated less than 20% calcified plaque and mid vessel bridging. The radiologist interpretation of study also felt low risk for PE. Pt reassured for unremarkable cardiac w/u. Continue control of risk factors.  Note: Pt also seems to  think her CP is anxiety related. She often develops symptoms when she is stressed/ anxious. She is asking about medication for this. I will refer her back to her PCP for management of this.   2. Pulmonary Nodule: incidental finding on chest CT. 33mm nodule noted in LLL. She is a social smoker and denies heavy use. Will refer her back to her PCP, who can decide on repeat CT scan in 1 year.   3. HTN: followed by PCP. Well controlled on current regimen.   4. HLD: on statin therapy with Lipitor. Followed by PCP.   5. DM: followed by PCP.   Given multiple cardiac risk factors, including strong family history (father and brother with CAD), we will have her f/u in 1 year for survalience  Nelida Gores 05/02/2016 3:44 PM

## 2016-08-14 ENCOUNTER — Telehealth: Payer: Self-pay | Admitting: Podiatry

## 2016-08-14 NOTE — Telephone Encounter (Signed)
Dr. Paulla Dolly has diagnosed me with plantar fasciitis and ordered me some shoe inserts. I am needing another pair and am wanting to know how much they cost. If you could, please call me back and if I do not answer you can leave a detailed voicemail message as I am at work. Thank you.

## 2016-08-17 ENCOUNTER — Encounter (HOSPITAL_COMMUNITY): Payer: Self-pay | Admitting: Emergency Medicine

## 2016-08-17 ENCOUNTER — Ambulatory Visit (HOSPITAL_COMMUNITY)
Admission: EM | Admit: 2016-08-17 | Discharge: 2016-08-17 | Disposition: A | Discharge status: Home / Self Care | Payer: Medicaid Other | Attending: Family Medicine | Admitting: Family Medicine

## 2016-08-17 DIAGNOSIS — R350 Frequency of micturition: Secondary | ICD-10-CM | POA: Insufficient documentation

## 2016-08-17 DIAGNOSIS — R310 Gross hematuria: Secondary | ICD-10-CM | POA: Diagnosis not present

## 2016-08-17 DIAGNOSIS — N390 Urinary tract infection, site not specified: Secondary | ICD-10-CM

## 2016-08-17 LAB — POCT URINALYSIS DIP (DEVICE)
Bilirubin Urine: NEGATIVE
Glucose, UA: NEGATIVE mg/dL
Ketones, ur: NEGATIVE mg/dL
Nitrite: NEGATIVE
Protein, ur: NEGATIVE mg/dL
Specific Gravity, Urine: 1.01 (ref 1.005–1.030)
Urobilinogen, UA: 0.2 mg/dL (ref 0.0–1.0)
pH: 6 (ref 5.0–8.0)

## 2016-08-17 MED ORDER — CEPHALEXIN 500 MG PO CAPS: 500.0000 mg | ORAL_CAPSULE | Freq: Four times a day (QID) | ORAL | 0 refills | Status: DC

## 2016-08-17 NOTE — Discharge Instructions (Signed)
Take antibiotic as directed. Continue taking the Azo-Standard and drink plenty of fluids stay well-hydrated.

## 2016-08-17 NOTE — ED Provider Notes (Signed)
CSN: [id number]     Arrival date & time 08/17/16  1737 History   First MD Initiated Contact with Patient 08/17/16 1851     Chief Complaint  Patient presents with  . Urinary Frequency   (Consider location/radiation/quality/duration/timing/severity/associated sxs/prior Treatment) 51 year old female with type 2 diabetes mellitus states that a couple days ago she developed urinary urgency, intermittent stream, dribbling, feeling of the need to urinate after urinating, pelvic pain when pushing to urinate and gross hematuria. Denies fever or chills, abdominal pain, back pain, nausea or vomiting.      Past Medical History:  Diagnosis Date  . ADD (attention deficit disorder)   . Allergy   . Depression   . Ejection fraction   . Gestational diabetes 1999; 2002  . History of chicken pox   . 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 Hyperlipidemia   . Hypertension   . Numbness and tingling   . Osteopenia   . Pneumonia 2012  . Type II diabetes mellitus ([place])    "borderline; I'm on Metformin"   Past Surgical History:  Procedure Laterality Date  . ANAL RECTAL MANOMETRY N/A 03/17/2015   Procedure: ANO RECTAL MANOMETRY;  Surgeon: Mauri Pole, MD;  Location: [facility];  Service: Endoscopy;  Laterality: N/A;  . LEFT HEART CATHETERIZATION WITH CORONARY ANGIOGRAM N/A 12/22/2013   Procedure: LEFT HEART CATHETERIZATION WITH CORONARY ANGIOGRAM;  Surgeon: Jettie Booze, MD;  Location: [facility] CATH LAB;  Service: Cardiovascular;  Laterality: N/A;  . SHOULDER ARTHROSCOPY W/ ROTATOR CUFF REPAIR Right 1990's  . VAGINAL HYSTERECTOMY  2013   HPV/Cervical Dysplasia; partial   Family History  Problem Relation Age of Onset  . Hyperlipidemia Mother   . Hyperlipidemia Father   . Hypertension Father   . Diabetes Father   . Heart disease Father   . Stroke Father   . Cancer Maternal Grandmother        Colon Cancer  . Alzheimer's disease Maternal Grandmother   . Heart disease Brother 40   Myocardial Infarction   Social History  Substance Use Topics  . Smoking status: Never Smoker  . Smokeless tobacco: Never Used  . Alcohol use No     Comment: 12/19/2013 "might have a drink a couple times/year"   OB History    No data available     Review of Systems  Constitutional: Negative for chills and fever.       Malaise  HENT: Negative.   Respiratory: Negative.   Gastrointestinal: Negative.   Genitourinary: Positive for difficulty urinating and frequency. Negative for vaginal pain.       As per history of present illness  All other systems reviewed and are negative.   Allergies  Sulfa antibiotics  Home Medications   Prior to Admission medications   Medication Sig Start Date End Date Taking? Authorizing Provider  aspirin 81 MG chewable tablet Chew 81 mg by mouth daily.     [provider]  atorvastatin (LIPITOR) 80 MG tablet Take 80 mg by mouth daily.    [provider]  cephALEXin (KEFLEX) 500 MG capsule Take 1 capsule (500 mg total) by mouth 4 (four) times daily. 08/17/16   Janne Napoleon, NP  cetirizine (ZYRTEC) 10 MG tablet Take 10 mg by mouth daily.    [provider]  Dulaglutide (TRULICITY) 1.5 LO/7.5IE SOPN Inject 0.5 mLs into the skin once a week.    [provider]  hydrochlorothiazide (HYDRODIURIL) 25 MG tablet Take 1 tablet (25 mg  total) by mouth daily. 05/25/15   Marcial Pacas, MD  lisdexamfetamine (VYVANSE) 20 MG capsule Take 1 capsule (20 mg total) by mouth daily. 11/30/15   [name], NP  lisinopril (PRINIVIL,ZESTRIL) 20 MG tablet Take 1 tablet (20 mg total) by mouth daily. 05/25/15   Marcial Pacas, MD  nitroGLYCERIN (NITROSTAT) 0.4 MG SL tablet Place 1 tablet (0.4 mg total) under the tongue every 5 (five) minutes as needed for chest pain. 12/20/13   Lendon Colonel, NP   Meds Ordered and Administered this Visit  Medications - No data to display  BP 126/88   Pulse 83   Temp 98.6 F (37 C) (Oral)   Resp 16   Ht 5'  3" (1.6 m)   Wt 165 lb (74.8 kg)   SpO2 97%   BMI 29.23 kg/m  No data found.   Physical Exam  Constitutional: She is oriented to person, place, and time. She appears well-developed and well-nourished. No distress.  Eyes: EOM are normal.  Neck: Neck supple.  Cardiovascular: Normal rate.   Pulmonary/Chest: Effort normal.  Neurological: She is alert and oriented to person, place, and time.  Skin: Skin is warm and dry.  Psychiatric: She has a normal mood and affect.  Nursing note and vitals reviewed.   Urgent Care Course     Procedures (including critical care time)  Labs Review Labs Reviewed  POCT URINALYSIS DIP (DEVICE) - Abnormal; Notable for the following:       Result Value   Hgb urine dipstick MODERATE (*)    Leukocytes, UA TRACE (*)    All other components within normal limits  URINE CULTURE    Imaging Review No results found.   Visual Acuity Review  Right Eye Distance:   Left Eye Distance:   Bilateral Distance:    Right Eye Near:   Left Eye Near:    Bilateral Near:         MDM   1. Lower urinary tract infectious disease    Take antibiotic as directed. Continue taking the Azo-Standard and drink plenty of fluids stay well-hydrated. Meds ordered this encounter  Medications  . cephALEXin (KEFLEX) 500 MG capsule    Sig: Take 1 capsule (500 mg total) by mouth 4 (four) times daily.    Dispense:  28 capsule    Refill:  0    Order Specific Question:   Supervising Provider    Answer:   Vanessa Kick [[id number]]       Janne Napoleon, NP 08/17/16 1901

## 2016-08-17 NOTE — ED Triage Notes (Signed)
PT reports urinary urgency, frequency, pressure, and hematuria that started yesterday.

## 2016-08-20 LAB — URINE CULTURE: Culture: >=100000 — AB

## 2016-10-04 NOTE — Telephone Encounter (Signed)
Ok with Lindsay Rios to do second pair..charge insurance [phone]

## 2016-10-04 NOTE — Telephone Encounter (Signed)
I have advised her to call us.Marland KitchenMarland KitchenLMOM  (left message on machine)

## 2017-02-08 ENCOUNTER — Other Ambulatory Visit (INDEPENDENT_AMBULATORY_CARE_PROVIDER_SITE_OTHER): Payer: Medicaid Other

## 2017-02-08 ENCOUNTER — Encounter: Payer: Self-pay | Admitting: Gastroenterology

## 2017-02-08 ENCOUNTER — Ambulatory Visit: Payer: Medicaid Other | Admitting: Gastroenterology

## 2017-02-08 VITALS — BP 152/90 | HR 80 | Ht 63.0 in | Wt 167.0 lb

## 2017-02-08 DIAGNOSIS — R159 Full incontinence of feces: Secondary | ICD-10-CM

## 2017-02-08 DIAGNOSIS — R197 Diarrhea, unspecified: Secondary | ICD-10-CM

## 2017-02-08 LAB — TSH: TSH: 0.76 u[IU]/mL (ref 0.35–4.50)

## 2017-02-08 LAB — IGA: IgA: 369 mg/dL (ref 68–378)

## 2017-02-08 MED ORDER — NA SULFATE-K SULFATE-MG SULF 17.5-3.13-1.6 GM/177ML PO SOLN: 1.0000 | Freq: Once | ORAL | 0 refills | Status: DC

## 2017-02-08 MED ORDER — NA SULFATE-K SULFATE-MG SULF 17.5-3.13-1.6 GM/177ML PO SOLN: 1.0000 | Freq: Once | ORAL | 0 refills | Status: AC

## 2017-02-08 MED ORDER — DICYCLOMINE HCL 20 MG PO TABS: 20.0000 mg | ORAL_TABLET | Freq: Three times a day (TID) | ORAL | 11 refills | Status: DC

## 2017-02-08 NOTE — Patient Instructions (Signed)
Your physician has requested that you go to the basement for lab work before leaving today.  We have sent the following medications to your pharmacy for you to pick up at your convenience: Bentyl.   You have been scheduled for a colonoscopy. Please follow written instructions given to you at your visit today.  Please pick up your prep supplies at the pharmacy within the next 1-3 days. If you use inhalers (even only as needed), please bring them with you on the day of your procedure. Your physician has requested that you go to www.startemmi.com and enter the access code given to you at your visit today. This web site gives a general overview about your procedure. However, you should still follow specific instructions given to you by our office regarding your preparation for the procedure.  Thank you for choosing me and [facility].  Pricilla Riffle. Dagoberto Ligas., MD., Marval Regal

## 2017-02-08 NOTE — Progress Notes (Signed)
    History of Present Illness: This is a 52 year old female with a history of fecal incontinence due to pelvic floor dysfunction and diarrhea diagnosed in February 2017 by anorectal manometry.  She was referred to physical therapy with Earlie Counts, PT. she feels she did not benefit from physical therapy at all and her symptoms have persisted.  It appears her last appointment was in April 2017. Last colonoscopy by Dr. Earlean Shawl for a family history of colon cancer in her MGM in 01/2012. Her son has celiac disease.  She relates worsening problems with diarrhea she states diarrhea occurs during the daytime hours not at night and occurs with meals stressful situations and at other times.  She notes mucus in her stool as well.  She does not generally have formed bowel movements.  Current Medications, Allergies, Past Medical History, Past Surgical History, Family History and Social History were reviewed in [employer] record.  Physical Exam: General: Well developed, well nourished, no acute distress Head: Normocephalic and atraumatic Eyes:  sclerae anicteric, EOMI Ears: Normal auditory acuity Mouth: No deformity or lesions Lungs: Clear throughout to auscultation Heart: Regular rate and rhythm; no murmurs, rubs or bruits Abdomen: Soft, non tender and non distended. No masses, hepatosplenomegaly or hernias noted. Normal Bowel sounds Rectal: Deferred to colonoscopy Musculoskeletal: Symmetrical with no gross deformities  Pulses:  Normal pulses noted Extremities: No clubbing, cyanosis, edema or deformities noted Neurological: Alert oriented x 4, grossly nonfocal Psychological:  Alert and cooperative. Normal mood and affect  Assessment and Recommendations:  1.  Fecal incontinence due to pelvic floor dysfunction with worsening diarrhea.  Rule out celiac disease, IBS, IBD, infection. tTG, IgA, TSH, GI pathogen panel.  Begin dicyclomine 10 mg 3 times daily ac.  Schedule colonoscopy. The  risks (including bleeding, perforation, infection, missed lesions, medication reactions and possible hospitalization or surgery if complications occur), benefits, and alternatives to colonoscopy with possible biopsy and possible polypectomy were discussed with the patient and they consent to proceed.  We discussed returning to physical therapy for further treatment of pelvic floor dysfunction and she prefers to wait on that until her diarrhea has been further evaluated

## 2017-02-08 NOTE — Addendum Note (Signed)
Addended by: Marzella Schlein on: 02/08/2017 11:38 AM   Modules accepted: Orders

## 2017-02-09 ENCOUNTER — Telehealth: Payer: Self-pay | Admitting: Gastroenterology

## 2017-02-09 LAB — TISSUE TRANSGLUTAMINASE, IGA: (tTG) Ab, IgA: 1 U/mL

## 2017-02-09 NOTE — Telephone Encounter (Signed)
I left a message that we are waiting for the second lab to result and that I will call her when it results

## 2017-02-10 ENCOUNTER — Other Ambulatory Visit: Payer: Self-pay | Admitting: Gastroenterology

## 2017-02-28 ENCOUNTER — Other Ambulatory Visit: Payer: Medicaid Other

## 2017-02-28 DIAGNOSIS — R197 Diarrhea, unspecified: Secondary | ICD-10-CM

## 2017-02-28 DIAGNOSIS — R159 Full incontinence of feces: Secondary | ICD-10-CM

## 2017-03-05 LAB — GASTROINTESTINAL PATHOGEN PANEL PCR
C. difficile Tox A/B, PCR: NOT DETECTED
Campylobacter, PCR: NOT DETECTED
Cryptosporidium, PCR: NOT DETECTED
E coli (ETEC) LT/ST PCR: NOT DETECTED
E coli (STEC) stx1/stx2, PCR: NOT DETECTED
E coli 0157, PCR: NOT DETECTED
Giardia lamblia, PCR: NOT DETECTED
Norovirus, PCR: NOT DETECTED
Rotavirus A, PCR: NOT DETECTED
Salmonella, PCR: NOT DETECTED
Shigella, PCR: NOT DETECTED

## 2017-03-14 ENCOUNTER — Other Ambulatory Visit: Payer: Self-pay

## 2017-03-14 ENCOUNTER — Ambulatory Visit (AMBULATORY_SURGERY_CENTER): Payer: Medicaid Other | Admitting: Gastroenterology

## 2017-03-14 ENCOUNTER — Encounter: Payer: Self-pay | Admitting: Gastroenterology

## 2017-03-14 VITALS — BP 131/81 | HR 76 | Temp 96.6°F | Resp 18 | Ht 63.0 in | Wt 167.0 lb

## 2017-03-14 DIAGNOSIS — R197 Diarrhea, unspecified: Secondary | ICD-10-CM | POA: Diagnosis not present

## 2017-03-14 MED ORDER — SODIUM CHLORIDE 0.9 % IV SOLN: 500.0000 mL | Freq: Once | INTRAVENOUS | Status: AC

## 2017-03-14 NOTE — Op Note (Signed)
[facility] Patient Name: Lindsay Rios Procedure Date: 03/14/2017 8:03 AM MRN: [id number] Endoscopist: Ladene Artist , MD Age: 52 Referring MD:  Date of Birth: 1965/03/16 Gender: Female Account #: [id number] Procedure:                Colonoscopy Indications:              Clinically significant diarrhea of unexplained                            origin Medicines:                Monitored Anesthesia Care Procedure:                Pre-Anesthesia Assessment:                           - Prior to the procedure, a History and Physical                            was performed, and patient medications and                            allergies were reviewed. The patient's tolerance of                            previous anesthesia was also reviewed. The risks                            and benefits of the procedure and the sedation                            options and risks were discussed with the patient.                            All questions were answered, and informed consent                            was obtained. Prior Anticoagulants: The patient has                            taken no previous anticoagulant or antiplatelet                            agents. ASA Grade Assessment: II - A patient with                            mild systemic disease. After reviewing the risks                            and benefits, the patient was deemed in                            satisfactory condition to undergo the procedure.  After obtaining informed consent, the colonoscope                            was passed under direct vision. Throughout the                            procedure, the patient's blood pressure, pulse, and                            oxygen saturations were monitored continuously. The                            Model PCF-H190DL [id number]) scope was introduced                            through the anus and advanced to the the  terminal                            ileum, with identification of the appendiceal                            orifice and IC valve. The terminal ileum, ileocecal                            valve, appendiceal orifice, and rectum were                            photographed. The quality of the bowel preparation                            was excellent. The colonoscopy was performed                            without difficulty. The patient tolerated the                            procedure well. Scope In: 8:08:12 AM Scope Out: 8:21:28 AM Scope Withdrawal Time: 0 hours 10 minutes 37 seconds  Total Procedure Duration: 0 hours 13 minutes 16 seconds  Findings:                 The perianal and digital rectal examinations were                            normal.                           The entire examined colon appeared normal on direct                            and retroflexion views. Random biopies obtained.                           The terminal ileum appeared normal. Complications:            No immediate complications.  Estimated blood loss:                            None. Estimated Blood Loss:     Estimated blood loss: none. Impression:               - The entire examined colon is normal on direct and                            retroflexion views. Random biopsies obtained.                           - The examined portion of the ileum was normal. Recommendation:           - Repeat colonoscopy in 10 years for screening                            purposes.                           - Patient has a contact number available for                            emergencies. The signs and symptoms of potential                            delayed complications were discussed with the                            patient. Return to normal activities tomorrow.                            Written discharge instructions were provided to the                            patient.                           -  Resume previous diet.                           - Continue present medications.                           - Imodium 1-2 tid prn diarrhea                           - Await pathology results.                           - Return to GI office in 6 weeks. Ladene Artist, MD 03/14/2017 8:26:15 AM This report has been signed electronically.

## 2017-03-14 NOTE — Patient Instructions (Signed)

## 2017-03-14 NOTE — Progress Notes (Signed)
Report given to PACU, vss 

## 2017-03-14 NOTE — Progress Notes (Signed)
Called to room to assist during endoscopic procedure.  Patient ID and intended procedure confirmed with present staff. Received instructions for my participation in the procedure from the performing physician.  

## 2017-03-15 ENCOUNTER — Telehealth: Payer: Self-pay

## 2017-03-15 NOTE — Telephone Encounter (Signed)
  Follow up Call-  Call back number 03/14/2017  Post procedure Call Back phone  # [phone]  Permission to leave phone message Yes  Some recent data might be hidden     Patient questions:  Do you have a fever, pain , or abdominal swelling? No. Pain Score  0 *  Have you tolerated food without any problems? Yes.    Have you been able to return to your normal activities? Yes.    Do you have any questions about your discharge instructions: Diet   No. Medications  No. Follow up visit  No.  Do you have questions or concerns about your Care? No.  Actions: * If pain score is 4 or above: No action needed, pain <4.

## 2017-03-15 NOTE — Telephone Encounter (Signed)
NO ANSWER, MESSAGE LEFT FOR PATIENT. 

## 2017-03-29 ENCOUNTER — Encounter: Payer: Self-pay | Admitting: Gastroenterology

## 2017-03-29 ENCOUNTER — Ambulatory Visit: Payer: Medicaid Other | Admitting: Gastroenterology

## 2017-03-29 VITALS — BP 104/68 | HR 72 | Ht 62.0 in | Wt 165.1 lb

## 2017-03-29 DIAGNOSIS — R159 Full incontinence of feces: Secondary | ICD-10-CM

## 2017-03-29 DIAGNOSIS — R197 Diarrhea, unspecified: Secondary | ICD-10-CM | POA: Diagnosis not present

## 2017-03-29 DIAGNOSIS — R152 Fecal urgency: Secondary | ICD-10-CM | POA: Diagnosis not present

## 2017-03-29 NOTE — Progress Notes (Signed)
    History of Present Illness: This is a 52 year old female with fecal incontinence due to pelvic floor dysfunction and diarrhea.  She still has intermittent diarrhea and soft stools which lead to incontinence.  She is able to control firmer stools.  We reviewed the results of her colonoscopy and biopsies.  She has had some reduction in symptoms taking dicyclomine before meals.  Colonoscopy 01/2017 - The entire examined colon is normal on direct and retroflexion views. Random biopsies Obtained. (normal mucosa) - The examined portion of the ileum was normal.   Current Medications, Allergies, Past Medical History, Past Surgical History, Family History and Social History were reviewed in [employer] record.  Physical Exam: General: Well developed, well nourished, no acute distress Head: Normocephalic and atraumatic Eyes:  sclerae anicteric, EOMI Ears: Normal auditory acuity Neurological: Alert oriented x 4, grossly nonfocal Psychological:  Alert and cooperative. Normal mood and affect  Assessment and Recommendations:  1.  Functional diarrhea and pelvic floor dysfunction with frequent fecal incontinence.  Continue dicyclomine 10 mg 3 times daily AC.  Begin Imodium 1 p.o. daily or twice daily to achieve firm stools.  If this is not effective trial of Kaopectate daily or twice daily.  She is advised to call us in about 1 month if she has not had success with this regimen.  If fecal incontinence continues plan for referral to a colorectal surgeon.  I spent 15 minutes of face-to-face time with the patient. Greater than 50% of the time was spent counseling and coordinating care.

## 2017-03-29 NOTE — Patient Instructions (Signed)
You can take Imodium 1-2 x daily or Kaopectate twice daily as needed.   Thank you for choosing me and [facility].  Pricilla Riffle. Dagoberto Ligas., MD., Marval Regal

## 2017-03-31 ENCOUNTER — Encounter: Payer: Self-pay | Admitting: Gastroenterology

## 2017-04-16 NOTE — Telephone Encounter (Signed)
Pt chart opened in error.

## 2017-04-25 ENCOUNTER — Encounter: Payer: Self-pay | Admitting: Cardiology

## 2017-04-25 ENCOUNTER — Ambulatory Visit: Payer: Medicaid Other | Admitting: Cardiology

## 2017-04-25 VITALS — BP 124/82 | HR 77 | Ht 62.0 in | Wt 166.0 lb

## 2017-04-25 DIAGNOSIS — E7849 Other hyperlipidemia: Secondary | ICD-10-CM

## 2017-04-25 DIAGNOSIS — R42 Dizziness and giddiness: Secondary | ICD-10-CM

## 2017-04-25 MED ORDER — EZETIMIBE 10 MG PO TABS: 10.0000 mg | ORAL_TABLET | Freq: Every day | ORAL | 6 refills | Status: DC

## 2017-04-25 NOTE — Progress Notes (Signed)
04/25/2017 Lindsay Rios   06-22-1965  [id number]  Primary Physician [name], PA-C Primary Cardiologist: Dr. Johnsie Cancel   Reason for Visit/CC: f/u for nonobstructive CAD  HPI:  52 y.o.femalewith past medical history of Hypertension, hyperlipidemia, Type 2 diabetes, and depression. She also has a h/o noncardiac chest pain. She had a cardiac cath in 2015, which showed only mild nonobstructive disease in the LAD and her chest pain was attributed to stress. She was evaluated again last year in the spring of 2018 for chest pain. She was seen in the ED by Dr. Johnsie Cancel for cardiac consultation. Troponins were negative x 2. EKG w/o ischemic changes. Chest x-ray showedno acute cardiopulmonary disease and thoracic aortic atherosclerosis. Coronary CT showed a calcium score of 2. Essentially normal right dominant coronary arteries. LAD with isolated less than 20% calcified plaque and mid vessel bridging. The radiologist interpretation of study also felt low risk for PE. She was however noted to have a 4 mm LLL pulmonary nodule. Based on her Coronary CT findings, no further w/u was advised. Pt was instructed to f/u with PCP regarding pulmonary nodule.   She presents back today for 1 year f/u. She denies CP and dyspnea. Her main concern is dizziness. Seems related to positional changes and walking. She is unable to exercise, because it triggers dizziness and causes balance/gate disturbances. She has concerns regarding possible carotid artery disease as her father had this and had strokes.   Cardiac Studies [facility] 2015 Procedural Findings: Hemodynamics: AO 110/64 LV 109/11  Coronary angiography: Coronary dominance: right  Left mainstem: Widely patent left main without obstructive  Left anterior descending (LAD): Patent to the LV apex. The proximal vessel and first diagonal are widely patent. The mid-vessel has very mild narrowing that I suspect is related to an intramyocardial segment but  could also be atherosclerotic with approximately 40% associated stenosis.  Left circumflex (LCx): Widely patent, smooth vessel without obstruction. The first OM is widely patent.  Right coronary artery (RCA): Minor proximal irregularity without stenosis. The vessel is large, dominant, without significant stenosis. The PDA and PLA branches are patent.   Left ventriculography: Left ventricular systolic function is normal, LVEF is estimated at 55-65%, there is no significant mitral regurgitation   Estimated Blood Loss: minimal  Final Conclusions:   1. Patent coronary arteries with nonobstructive stenosis of the mid-LAD, minimal plaque in the proximal RCA, and otherwise no disease identified. 2. Normal LV systolic function  Recommendations: Suspect noncardiac chest pain. Risk reduction measures.  Coronary CTA 03/2016  FINDINGS: Non-cardiac: See separate report from [facility]. No significant findings on limited lung and soft tissue windows.  Calcium Score:  2 isolated calcium seen in proximal LAD  Coronary Arteries: Right dominant with no anomalies  LM: Normal  LAD: Less than 20% calcified plaque in proximal LAD. 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 LAD with isolated less than 20% calcified plaque and mid vessel bridging  2) Calcium Score of 2 which is 89th percentile for age and sex matched controls  3) See radiology addendum regarding low risk pulmonary nodule   Current Meds  Medication Sig  . aspirin 81 MG chewable tablet Chew 81 mg by mouth daily.   Marland Kitchen atorvastatin (LIPITOR)  80 MG tablet Take 80 mg by mouth daily.  . cetirizine (ZYRTEC) 10 MG tablet Take 10 mg by mouth daily.  . Cholecalciferol  (PA VITAMIN D-3 GUMMY PO) Chew one (1) gummy by mouth daily.  Marland Kitchen dicyclomine (BENTYL) 20 MG tablet TAKE 1 TABLET(20 MG) BY MOUTH FOUR TIMES DAILY BEFORE MEALS AND AT BEDTIME  . lisdexamfetamine (VYVANSE) 20 MG capsule Take 1 capsule (20 mg total) by mouth daily.  Marland Kitchen lisinopril (PRINIVIL,ZESTRIL) 20 MG tablet Take 1 tablet (20 mg total) by mouth daily.  . Loperamide HCl (IMODIUM PO) Take 1-2 tablets by mouth daily.  . Multiple Vitamins-Minerals (ALIVE WOMENS GUMMY PO) Chew one (1) gummy by mouth daily.  . nitroGLYCERIN (NITROSTAT) 0.4 MG SL tablet Place 1 tablet (0.4 mg total) under the tongue every 5 (five) minutes as needed for chest pain.   Current Facility-Administered Medications for the 04/25/17 encounter (Office Visit) with Consuelo Pandy, PA-C  Medication  . 0.9 %  sodium chloride infusion   Allergies  Allergen Reactions  . Sulfa Antibiotics Nausea Only    REACTION: nausea   Past Medical History:  Diagnosis Date  . ADD (attention deficit disorder)   . Allergy   . Depression   . Ejection fraction   . Gestational diabetes 1999; 2002  . History of chicken pox   . 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 Hyperlipidemia   . Hypertension   . Numbness and tingling   . Osteopenia   . Pneumonia 2012  . Sleep apnea with use of continuous positive airway pressure (CPAP)   . Type II diabetes mellitus ([place])    "borderline; I'm on Metformin"   Family History  Problem Relation Age of Onset  . Hyperlipidemia Mother   . Hyperlipidemia Father   . Hypertension Father   . Diabetes Father   . Heart disease Father   . Stroke Father   . Cancer Maternal Grandmother        Colon Cancer  . Alzheimer's disease Maternal Grandmother   . Colon cancer Maternal Grandmother   . Heart disease Brother 40       Myocardial Infarction  . Esophageal cancer Neg Hx   . Stomach cancer Neg Hx   . Rectal cancer Neg Hx    Past Surgical History:  Procedure Laterality Date  . ANAL RECTAL MANOMETRY  N/A 03/17/2015   Procedure: ANO RECTAL MANOMETRY;  Surgeon: Mauri Pole, MD;  Location: [facility] ENDOSCOPY;  Service: Endoscopy;  Laterality: N/A;  . LEFT HEART CATHETERIZATION WITH CORONARY ANGIOGRAM N/A 12/22/2013   Procedure: LEFT HEART CATHETERIZATION WITH CORONARY ANGIOGRAM;  Surgeon: Jettie Booze, MD;  Location: [facility] CATH LAB;  Service: Cardiovascular;  Laterality: N/A;  . SHOULDER ARTHROSCOPY W/ ROTATOR CUFF REPAIR Right 1990's  . VAGINAL HYSTERECTOMY  2013   HPV/Cervical Dysplasia; partial   Social History   Socioeconomic History  . Marital status: Divorced    Spouse name: [name] (Separated)   . Number of children: 2  . Years of education: 45  . Highest education level: Not on file  Occupational History  . 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 Occupation: Psychologist, sport and exercise  Social Needs  . Financial resource strain: Not on file  . Food insecurity:    Worry: Not on file    Inability: Not on file  . Transportation needs:    Medical: Not on file    Non-medical: Not on file  Tobacco Use  .  Smoking status: Never Smoker  . Smokeless tobacco: Never Used  Substance and Sexual Activity  . Alcohol use: No    Alcohol/week: 0.0 oz    Comment: 12/19/2013 "might have a drink a couple times/year"  . Drug use: No  . Sexual activity: Not Currently    Partners: Male  Lifestyle  . Physical activity:    Days per week: Not on file    Minutes per session: Not on file  . Stress: Not on file  Relationships  . Social connections:    Talks on phone: Not on file    Gets together: Not on file    Attends religious service: Not on file    Active member of club or organization: Not on file    Attends meetings of clubs or organizations: Not on file    Relationship status: Not on file  . Intimate partner violence:    Fear of current or ex partner: Not on file    Emotionally abused: Not on file    Physically abused: Not on file    Forced sexual activity: Not on  file  Other Topics Concern  . Not on file  Social History Narrative   Marital Status: Separated [name]) Significant Other [name])    Children:  Son (1) Daughter (1)    Pets: Dogs (2), Cat (1)    Living Situation: Lives with children.   Occupation: Full- Time Student    Education: [employer] ([place])    Tobacco Use: She has never smoked.     Alcohol Use:  Rarely   Drug Use:  None   Diet:  Regular   Exercise:  None   Hobbies: Shopping        Review of Systems: General: negative for chills, fever, night sweats or weight changes.  Cardiovascular: negative for chest pain, dyspnea on exertion, edema, orthopnea, palpitations, paroxysmal nocturnal dyspnea or shortness of breath Dermatological: negative for rash Respiratory: negative for cough or wheezing Urologic: negative for hematuria Abdominal: negative for nausea, vomiting, diarrhea, bright red blood per rectum, melena, or hematemesis Neurologic: negative for visual changes, syncope, or dizziness All other systems reviewed and are otherwise negative except as noted above.   Physical Exam:  Blood pressure 124/82, pulse 77, height 5\' 2"  (1.575 m), weight 166 lb (75.3 kg).  General appearance: alert, cooperative and no distress Neck: no carotid bruit and no JVD Lungs: clear to auscultation bilaterally Heart: regular rate and rhythm, S1, S2 normal, no murmur, click, rub or gallop Extremities: extremities normal, atraumatic, no cyanosis or edema Pulses: 2+ and symmetric Skin: Skin color, texture, turgor normal. No rashes or lesions Neurologic: Grossly normal  EKG NSR 77 bpm -- personally reviewed   ASSESSMENT AND PLAN:   1. Nonobstructive CAD: Less than 20% calcified plaque in proximal LAD. 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 She denies CP. No dyspnea. Continue medical therapy for disease progression and continue management of cardiac risk factors. BP and DM are controlled (Hgb  A1c 6.6). We will adjust cholesterol meds for additional LDL lowering.   2. HLD: LDL 82 mg/dL. She reports full compliance with Lipitor 80 mg. Will add Zetia 10 mg. Repeat FLP and HFTs in 6 weeks. Goal LDL < 70 mg/dL.   3. Dizziness w/ Balance Problems: orthostatic VS were checked today and negative. No carotid bruits noted on exam, however given family history we will order bilateral carotid dopplers. If normal, I will recommend she follow-up  with her PCP. ? Neurology referral.   4.HTN: controlled on current regimen.   5. DM: Controlled and followed by PCP.    Follow-Up w/ Dr. Johnsie Cancel in 1 year   Brittainy Ladoris Gene, MHS [facility] 04/25/2017 12:14 PM

## 2017-04-25 NOTE — Patient Instructions (Addendum)
Medication Instructions:  Your physician has recommended you make the following change in your medication: Start Zetia 10 mg by mouth daily.   Labwork: Your physician recommends that you return for lab work in: 6 weeks.   This will be fasting.  (lipid and liver profiles).  The lab opens at 7:30 AM   Testing/Procedures: You have been referred to Nutrition management   Your physician has requested that you have a carotid duplex. This test is an ultrasound of the carotid arteries in your neck. It looks at blood flow through these arteries that supply the brain with blood. Allow one hour for this exam. There are no restrictions or special instructions.    Follow-Up:  Your physician wants you to follow-up in: 12 months with Dr. Blima Singer will receive a reminder letter in the mail two months in advance. If you don't receive a letter, please call our office to schedule the follow-up appointment.   Any Other Special Instructions Will Be Listed Below (If Applicable).   Cholesterol Cholesterol is a fat. Your body needs a small amount of cholesterol. Cholesterol (plaque) may build up in your blood vessels (arteries). That makes you more likely to have a heart attack or stroke. You cannot feel your cholesterol level. Having a blood test is the only way to find out if your level is high. Keep your test results. Work with your doctor to keep your cholesterol at a good level. What do the results mean?  Total cholesterol is how much cholesterol is in your blood.  LDL is bad cholesterol. This is the type that can build up. Try to have low LDL.  HDL is good cholesterol. It cleans your blood vessels and carries LDL away. Try to have high HDL.  Triglycerides are fat that the body can store or burn for energy. What are good levels of cholesterol?  Total cholesterol below 200.  LDL below 100 is good for people who have health risks. LDL below 70 is good for people who have very high risks.  HDL  above 40 is good. It is best to have HDL of 60 or higher.  Triglycerides below 150. How can I lower my cholesterol? Diet Follow your diet program as told by your doctor.  Choose fish, white meat chicken, or [place] that is roasted or baked. Try not to eat red meat, fried foods, sausage, or lunch meats.  Eat lots of fresh fruits and vegetables.  Choose whole grains, beans, pasta, potatoes, and cereals.  Choose olive oil, corn oil, or canola oil. Only use small amounts.  Try not to eat butter, mayonnaise, shortening, or palm kernel oils.  Try not to eat foods with trans fats.  Choose low-fat or nonfat dairy foods. ? Drink skim or nonfat milk. ? Eat low-fat or nonfat yogurt and cheeses. ? Try not to drink whole milk or cream. ? Try not to eat ice cream, egg yolks, or full-fat cheeses.  Healthy desserts include angel food cake, ginger snaps, animal crackers, hard candy, popsicles, and low-fat or nonfat frozen yogurt. Try not to eat pastries, cakes, pies, and cookies.  Exercise Follow your exercise program as told by your doctor.  Be more active. Try gardening, walking, and taking the stairs.  Ask your doctor about ways that you can be more active.  Medicine  Take over-the-counter and prescription medicines only as told by your doctor. This information is not intended to replace advice given to you by your health care provider. Make sure you  discuss any questions you have with your health care provider. Document Released: 04/14/2008 Document Revised: 08/18/2015 Document Reviewed: 07/29/2015 Elsevier Interactive Patient Education  [employer].    If you need a refill on your cardiac medications before your next appointment, please call your pharmacy.   Heart-Healthy Eating Plan Heart-healthy meal planning includes:  Limiting unhealthy fats.  Increasing healthy fats.  Making other small dietary changes.  You may need to talk with your doctor or a diet specialist  (dietitian) to create an eating plan that is right for you. What types of fat should I choose?  Choose healthy fats. These include olive oil and canola oil, flaxseeds, walnuts, almonds, and seeds.  Eat more omega-3 fats. These include salmon, mackerel, sardines, tuna, flaxseed oil, and ground flaxseeds. Try to eat fish at least twice each week.  Limit saturated fats. ? Saturated fats are often found in animal products, such as meats, butter, and cream. ? Plant sources of saturated fats include palm oil, palm kernel oil, and coconut oil.  Avoid foods with partially hydrogenated oils in them. These include stick margarine, some tub margarines, cookies, crackers, and other baked goods. These contain trans fats. What general guidelines do I need to follow?  Check food labels carefully. Identify foods with trans fats or high amounts of saturated fat.  Fill one half of your plate with vegetables and green salads. Eat 4-5 servings of vegetables per day. A serving of vegetables is: ? 1 cup of raw leafy vegetables. ?  cup of raw or cooked cut-up vegetables. ?  cup of vegetable juice.  Fill one fourth of your plate with whole grains. Look for the word "whole" as the first word in the ingredient list.  Fill one fourth of your plate with lean protein foods.  Eat 4-5 servings of fruit per day. A serving of fruit is: ? One medium whole fruit. ?  cup of dried fruit. ?  cup of fresh, frozen, or canned fruit. ?  cup of 100% fruit juice.  Eat more foods that contain soluble fiber. These include apples, broccoli, carrots, beans, peas, and barley. Try to get 20-30 g of fiber per day.  Eat more home-cooked food. Eat less restaurant, buffet, and fast food.  Limit or avoid alcohol.  Limit foods high in starch and sugar.  Avoid fried foods.  Avoid frying your food. Try baking, boiling, grilling, or broiling it instead. You can also reduce fat by: ? Removing the skin from poultry. ? Removing  all visible fats from meats. ? Skimming the fat off of stews, soups, and gravies before serving them. ? Steaming vegetables in water or broth.  Lose weight if you are overweight.  Eat 4-5 servings of nuts, legumes, and seeds per week: ? One serving of dried beans or legumes equals  cup after being cooked. ? One serving of nuts equals 1 ounces. ? One serving of seeds equals  ounce or one tablespoon.  You may need to keep track of how much salt or sodium you eat. This is especially true if you have high blood pressure. Talk with your doctor or dietitian to get more information. What foods can I eat? Grains Breads, including [place], white, pita, wheat, raisin, rye, oatmeal, and [place]. Tortillas that are neither fried nor made with lard or trans fat. Low-fat rolls, including hotdog and hamburger buns and English muffins. Biscuits. Muffins. Waffles. Pancakes. Light popcorn. Whole-grain cereals. Flatbread. Melba toast. Pretzels. Breadsticks. Rusks. Low-fat snacks. Low-fat crackers, including oyster,  saltine, matzo, graham, animal, and rye. Rice and pasta, including brown rice and pastas that are made with whole wheat. Vegetables All vegetables. Fruits All fruits, but limit coconut. Meats and Other Protein Sources Lean, well-trimmed beef, veal, pork, and lamb. Chicken and [place] without skin. All fish and shellfish. Wild duck, rabbit, pheasant, and venison. Egg whites or low-cholesterol egg substitutes. Dried beans, peas, lentils, and tofu. Seeds and most nuts. Dairy Low-fat or nonfat cheeses, including ricotta, string, and mozzarella. Skim or 1% milk that is liquid, powdered, or evaporated. Buttermilk that is made with low-fat milk. Nonfat or low-fat yogurt. Beverages Mineral water. Diet carbonated beverages. Sweets and Desserts Sherbets and fruit ices. Honey, jam, marmalade, jelly, and syrups. Meringues and gelatins. Pure sugar candy, such as hard candy, jelly beans, gumdrops, mints,  marshmallows, and small amounts of dark chocolate. [employer] Eat all sweets and desserts in moderation. Fats and Oils Nonhydrogenated (trans-free) margarines. Vegetable oils, including soybean, sesame, sunflower, olive, peanut, safflower, corn, canola, and cottonseed. Salad dressings or mayonnaise made with a vegetable oil. Limit added fats and oils that you use for cooking, baking, salads, and as spreads. Other Cocoa powder. Coffee and tea. All seasonings and condiments. The items listed above may not be a complete list of recommended foods or beverages. Contact your dietitian for more options. What foods are not recommended? Grains Breads that are made with saturated or trans fats, oils, or whole milk. Croissants. Butter rolls. Cheese breads. Sweet rolls. Donuts. Buttered popcorn. Chow mein noodles. High-fat crackers, such as cheese or butter crackers. Meats and Other Protein Sources Fatty meats, such as hotdogs, short ribs, sausage, spareribs, bacon, rib eye roast or steak, and mutton. High-fat deli meats, such as salami and bologna. Caviar. Domestic duck and goose. Organ meats, such as kidney, liver, sweetbreads, and heart. Dairy Cream, sour cream, cream cheese, and creamed cottage cheese. Whole-milk cheeses, including blue (bleu), Monterey Jack, [place], [place], American, [place], Swiss, cheddar, [place], and [place]. Whole or 2% milk that is liquid, evaporated, or condensed. Whole buttermilk. Cream sauce or high-fat cheese sauce. Yogurt that is made from whole milk. Beverages Regular sodas and juice drinks with added sugar. Sweets and Desserts Frosting. Pudding. Cookies. Cakes other than angel food cake. Candy that has milk chocolate or white chocolate, hydrogenated fat, butter, coconut, or unknown ingredients. Buttered syrups. Full-fat ice cream or ice cream drinks. Fats and Oils Gravy that has suet, meat fat, or shortening. Cocoa butter, hydrogenated oils, palm oil, coconut oil, palm  kernel oil. These can often be found in baked products, candy, fried foods, nondairy creamers, and whipped toppings. Solid fats and shortenings, including bacon fat, salt pork, lard, and butter. Nondairy cream substitutes, such as coffee creamers and sour cream substitutes. Salad dressings that are made of unknown oils, cheese, or sour cream. The items listed above may not be a complete list of foods and beverages to avoid. Contact your dietitian for more information. This information is not intended to replace advice given to you by your health care provider. Make sure you discuss any questions you have with your health care provider. Document Released: 07/18/2011 Document Revised: 06/24/2015 Document Reviewed: 07/10/2013 Elsevier Interactive Patient Education  [employer].

## 2017-04-26 ENCOUNTER — Ambulatory Visit (HOSPITAL_COMMUNITY)
Admission: RE | Admit: 2017-04-26 | Discharge: 2017-04-26 | Disposition: A | Discharge status: Home / Self Care | Payer: Medicaid Other | Source: Ambulatory Visit | Attending: Cardiology | Admitting: Cardiology

## 2017-04-26 ENCOUNTER — Other Ambulatory Visit: Payer: Self-pay | Admitting: Cardiology

## 2017-04-26 DIAGNOSIS — E119 Type 2 diabetes mellitus without complications: Secondary | ICD-10-CM | POA: Diagnosis not present

## 2017-04-26 DIAGNOSIS — E785 Hyperlipidemia, unspecified: Secondary | ICD-10-CM | POA: Insufficient documentation

## 2017-04-26 DIAGNOSIS — R42 Dizziness and giddiness: Secondary | ICD-10-CM

## 2017-04-26 DIAGNOSIS — I10 Essential (primary) hypertension: Secondary | ICD-10-CM | POA: Diagnosis not present

## 2017-04-26 DIAGNOSIS — I6523 Occlusion and stenosis of bilateral carotid arteries: Secondary | ICD-10-CM

## 2017-04-27 NOTE — Progress Notes (Signed)
Pt has been made aware of normal result and verbalized understanding.  jw 04/27/17

## 2017-06-06 ENCOUNTER — Other Ambulatory Visit: Payer: Medicaid Other

## 2018-01-21 ENCOUNTER — Other Ambulatory Visit: Payer: Self-pay | Admitting: Obstetrics and Gynecology

## 2018-01-21 DIAGNOSIS — R928 Other abnormal and inconclusive findings on diagnostic imaging of breast: Secondary | ICD-10-CM

## 2018-01-31 ENCOUNTER — Other Ambulatory Visit: Payer: Self-pay | Admitting: Obstetrics and Gynecology

## 2018-01-31 ENCOUNTER — Ambulatory Visit: Admission: RE | Admit: 2018-01-31 | Payer: Medicaid Other | Source: Ambulatory Visit

## 2018-01-31 ENCOUNTER — Ambulatory Visit
Admission: RE | Admit: 2018-01-31 | Discharge: 2018-01-31 | Disposition: A | Discharge status: Home / Self Care | Payer: Medicaid Other | Source: Ambulatory Visit | Attending: Obstetrics and Gynecology | Admitting: Obstetrics and Gynecology

## 2018-01-31 DIAGNOSIS — R928 Other abnormal and inconclusive findings on diagnostic imaging of breast: Secondary | ICD-10-CM

## 2018-01-31 IMAGING — MG DIGITAL DIAGNOSTIC UNILATERAL RIGHT MAMMOGRAM WITH TOMO AND CAD
4 series · 4 of 12 positions shown · non-contrast
Comparison: Previous exam(s).

CLINICAL DATA: Patient returns today to evaluate a possible RIGHT
breast distortion questioned on recent screening mammogram.

EXAM:
DIGITAL DIAGNOSTIC UNILATERAL RIGHT MAMMOGRAM WITH CAD AND TOMO

[R MLO synth-2D]
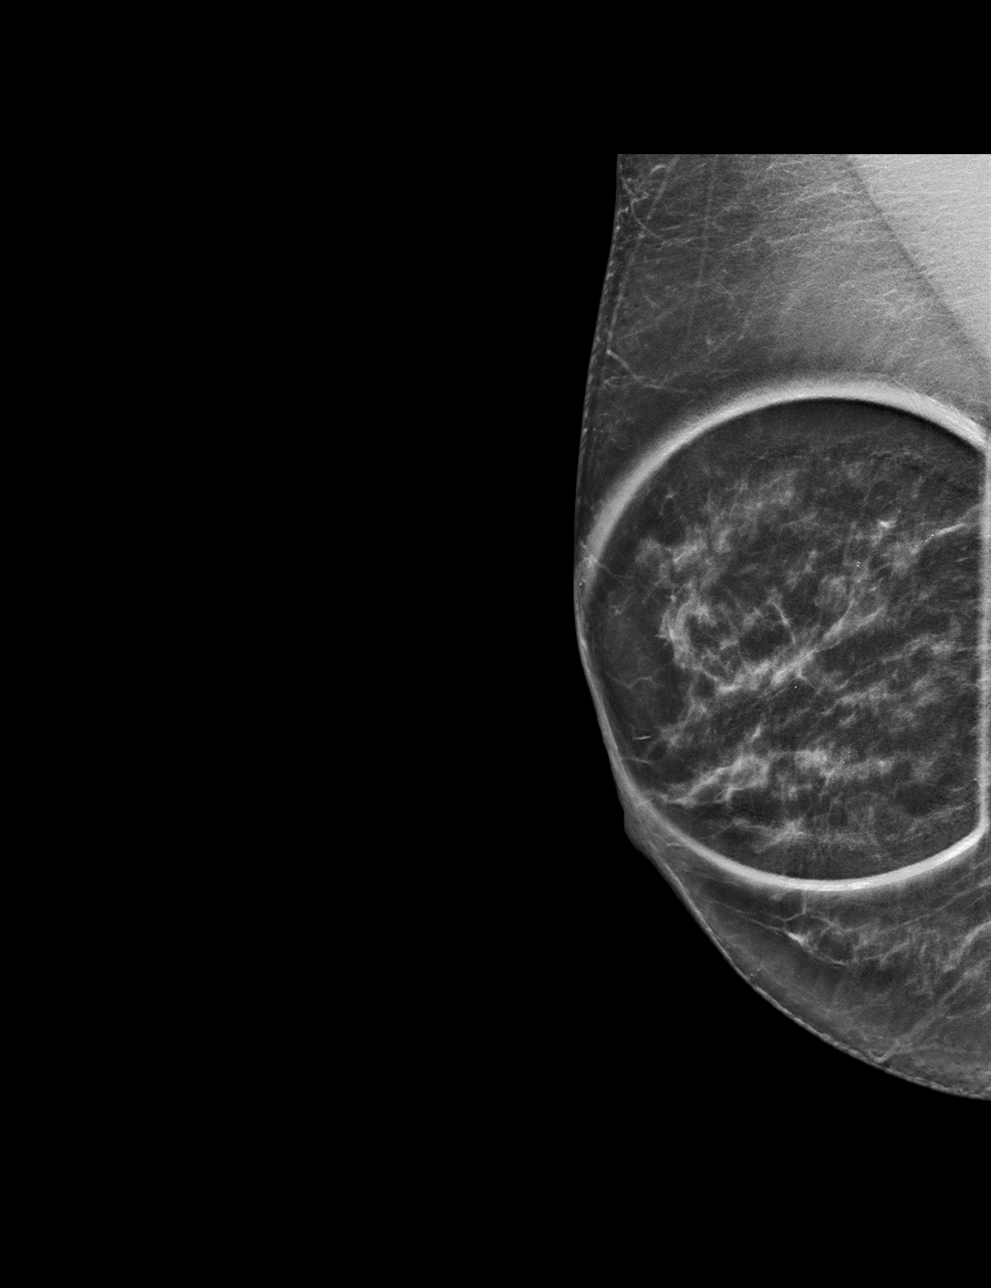

[R ML synth-2D]
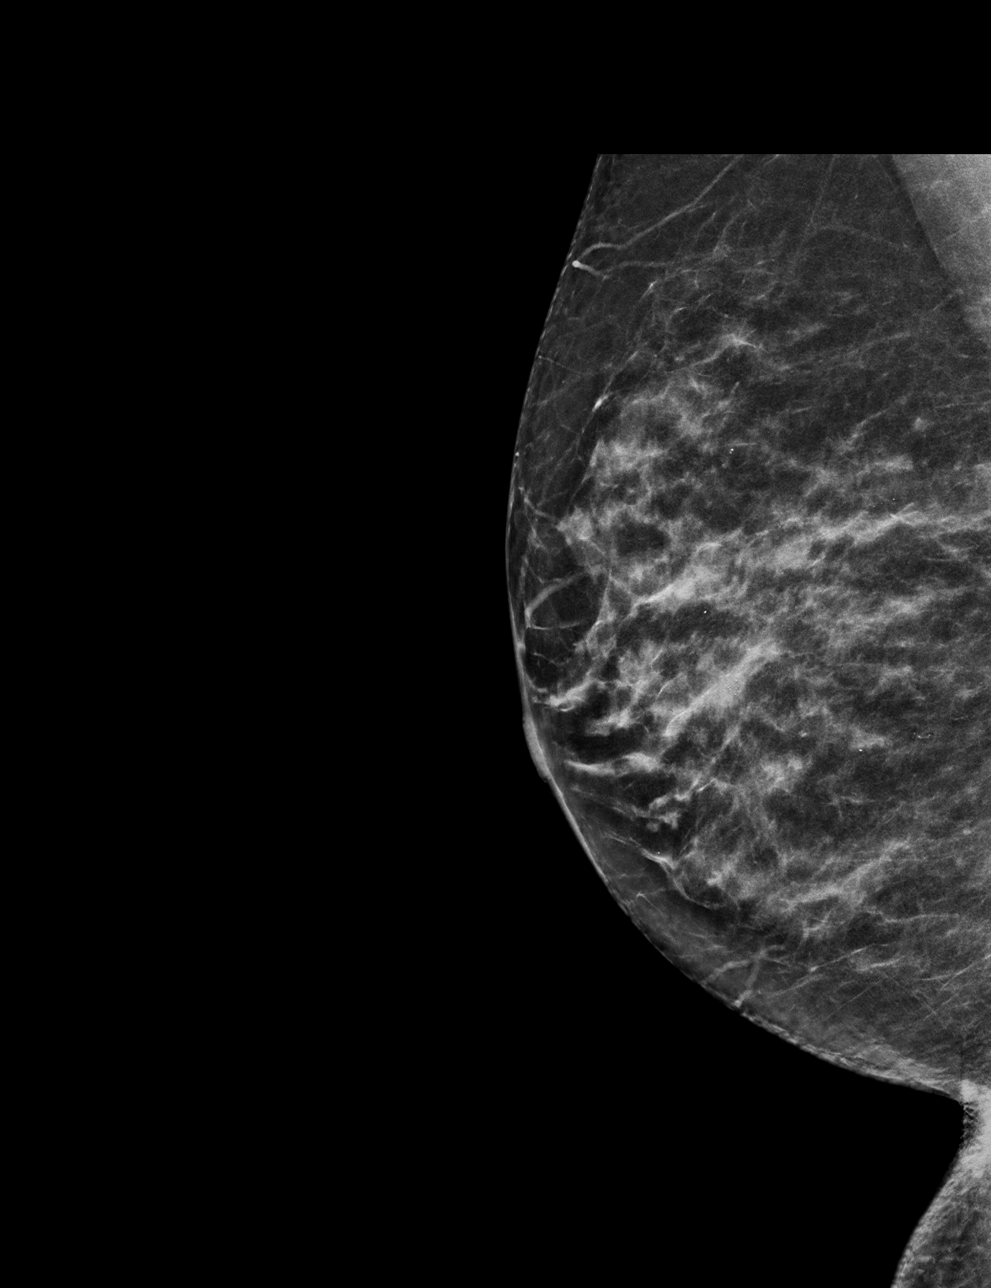

[R ML tomo · tomo slice 33/64.0]
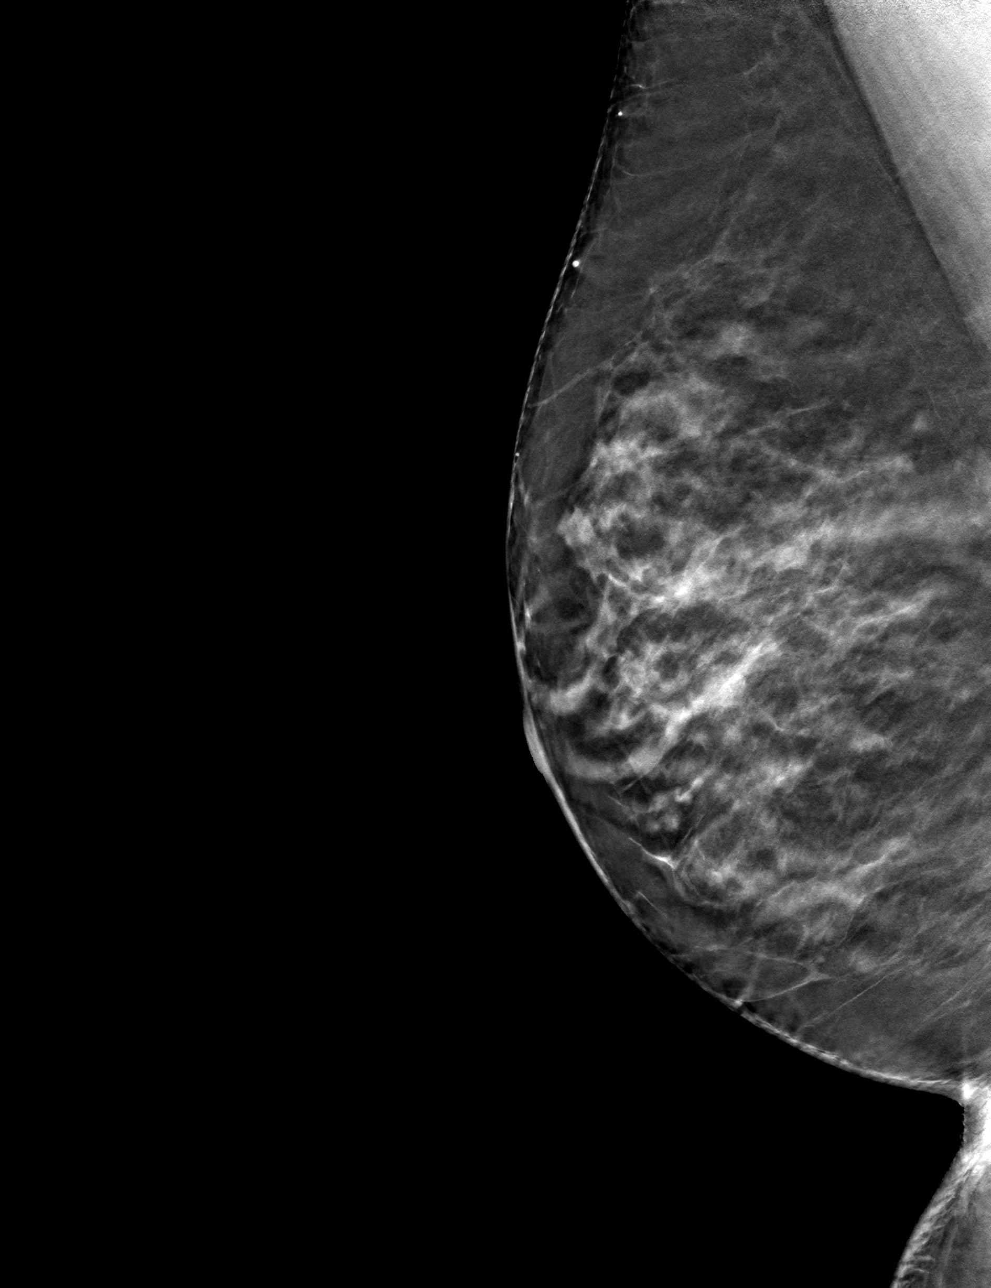

[R MLO tomo · tomo slice 33/66.0]
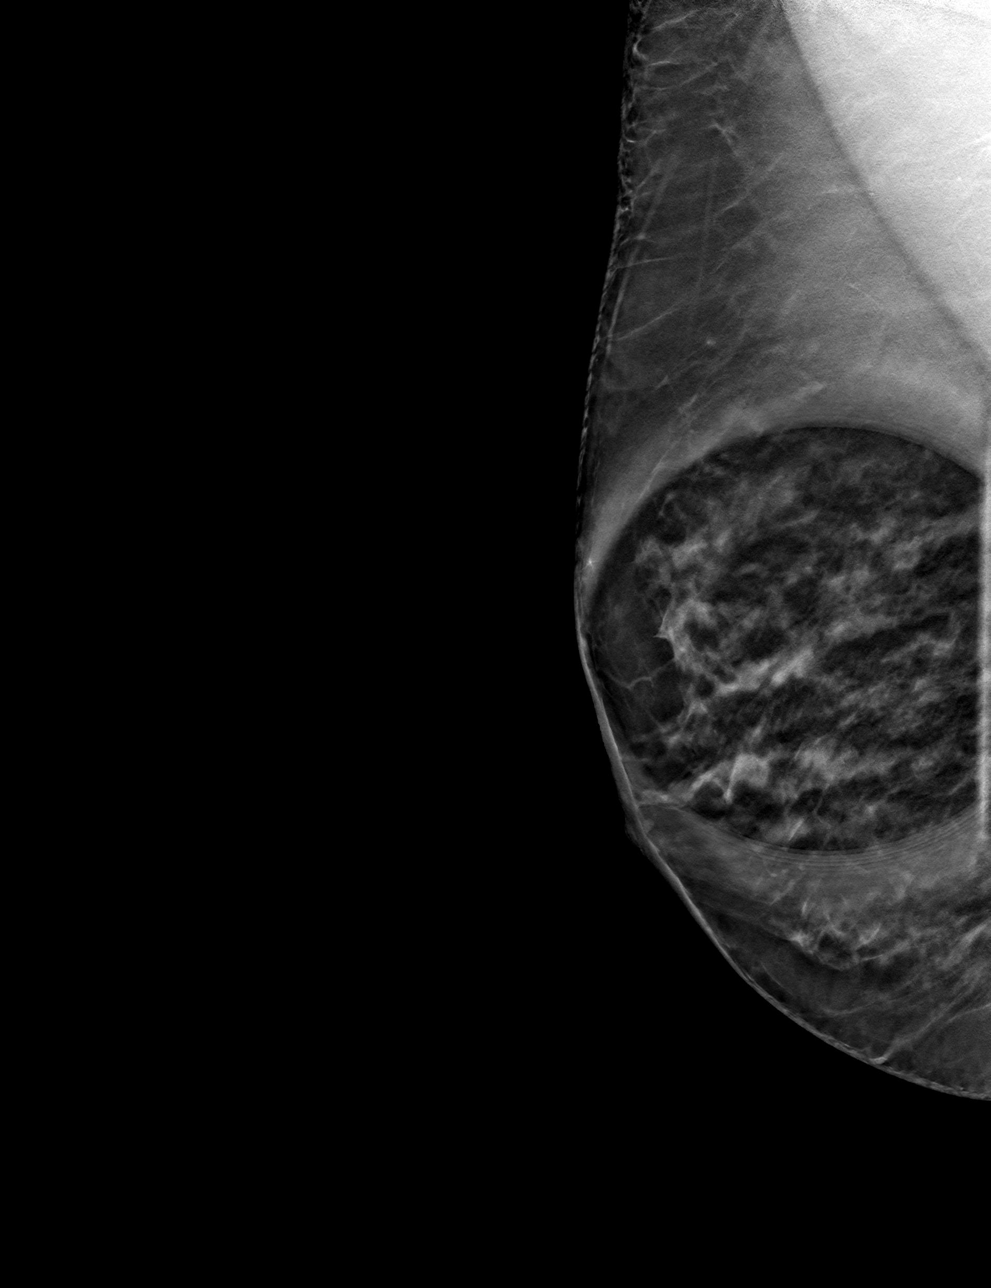

[4 of 12 positions shown; findings below may reference images not displayed]

ACR Breast Density Category c: The breast tissue is heterogeneously
dense, which may obscure small masses.
FINDINGS: On today's additional diagnostic views, including spot compression
with 3D tomosynthesis, there is no persistent abnormality within the
RIGHT breast indicating superimposition of normal fibroglandular
tissues. There are no new dominant masses, suspicious calcifications
or secondary signs of malignancy identified on today's exam.

Mammographic images were processed with CAD.
IMPRESSION: No evidence of malignancy. Patient may return to routine annual
bilateral screening mammogram schedule.

RECOMMENDATION:
Screening mammogram in one year.(Code:[CF])

I have discussed the findings and recommendations with the patient.
Results were also provided in writing at the conclusion of the
visit. If applicable, a reminder letter will be sent to the patient
regarding the next appointment.

BI-RADS CATEGORY  1: Negative.

## 2018-02-27 ENCOUNTER — Other Ambulatory Visit: Payer: Self-pay | Admitting: Gastroenterology

## 2018-05-11 ENCOUNTER — Other Ambulatory Visit: Payer: Self-pay | Admitting: Gastroenterology

## 2018-07-25 ENCOUNTER — Other Ambulatory Visit: Payer: Self-pay | Admitting: Gastroenterology

## 2018-08-26 ENCOUNTER — Other Ambulatory Visit: Payer: Self-pay

## 2018-08-26 MED ORDER — DICYCLOMINE HCL 20 MG PO TABS: ORAL_TABLET | ORAL | 0 refills | Status: DC

## 2018-08-28 ENCOUNTER — Encounter: Payer: Self-pay | Admitting: Gastroenterology

## 2018-08-28 ENCOUNTER — Ambulatory Visit (INDEPENDENT_AMBULATORY_CARE_PROVIDER_SITE_OTHER): Payer: 59 | Admitting: Gastroenterology

## 2018-08-28 VITALS — Ht 63.0 in | Wt 163.0 lb

## 2018-08-28 DIAGNOSIS — K591 Functional diarrhea: Secondary | ICD-10-CM

## 2018-08-28 MED ORDER — DICYCLOMINE HCL 20 MG PO TABS: ORAL_TABLET | ORAL | 11 refills | Status: DC

## 2018-08-28 NOTE — Progress Notes (Signed)
    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  Her symptoms have been under good control on dicyclomine and Imodium as needed.  Fecal incontinence rarely occurring.  She recently ran out of dicyclomine and her symptoms worsened.  She notes several foods that exacerbate symptoms on some occasions and on other occasions she is able to tolerate these foods.  No other gastrointestinal complaints.  Current Medications, Allergies, Past Medical History, Past Surgical History, Family History and Social History were reviewed in [employer] record.   Physical Exam: Telemedicine - not performed   Assessment and Recommendations:  1.  Functional diarrhea.  Continue dicyclomine 20 mg p.o. AC and HS, refills for 1 year.  Imodium 1/2-1 whole tablet twice daily as needed or Kaopectate twice daily as needed.  Avoid foods and beverages that exacerbate symptoms.  REV in 1 year.    These services were provided via telemedicine, audio and video.  The patient was at home and the provider was in the office, alone.  We discussed the limitations of evaluation and management by telemedicine and the availability of in person appointments.  Patient consented for this telemedicine visit and is aware of possible charges for this service.  Office CMA or LPN participated in this telemedicine service.  Time spent on call: 7 minutes

## 2018-08-28 NOTE — Patient Instructions (Signed)
We have sent the following medications to your pharmacy for you to pick up at your convenience: dicyclomine.   Take Imodium 1/2- tablet by mouth twice daily as needed or Kaopectate twice daily as needed.  Lujean Rave you for choosing me and [facility].  Pricilla Riffle. Dagoberto Ligas., MD., Marval Regal

## 2018-09-18 ENCOUNTER — Other Ambulatory Visit: Payer: Self-pay | Admitting: Physician Assistant

## 2018-09-18 ENCOUNTER — Ambulatory Visit
Admission: RE | Admit: 2018-09-18 | Discharge: 2018-09-18 | Disposition: A | Discharge status: Home / Self Care | Payer: 59 | Source: Ambulatory Visit | Attending: Physician Assistant | Admitting: Physician Assistant

## 2018-09-18 DIAGNOSIS — M25552 Pain in left hip: Secondary | ICD-10-CM

## 2018-09-18 DIAGNOSIS — M25551 Pain in right hip: Secondary | ICD-10-CM

## 2018-09-18 IMAGING — CR DG HIP (WITH OR WITHOUT PELVIS) 2V BILAT
3 series · 3 of 3 positions shown · non-contrast
Comparison: None.

CLINICAL DATA: Pt c/o of bilateral hip pain, more so right hip. Pt
states this is a chronic but pain has worsened in the last 6 months.
Pt denies injury or fall.

EXAM:
DG HIP (WITH OR WITHOUT PELVIS) 2V BILAT

[w pelvis *]
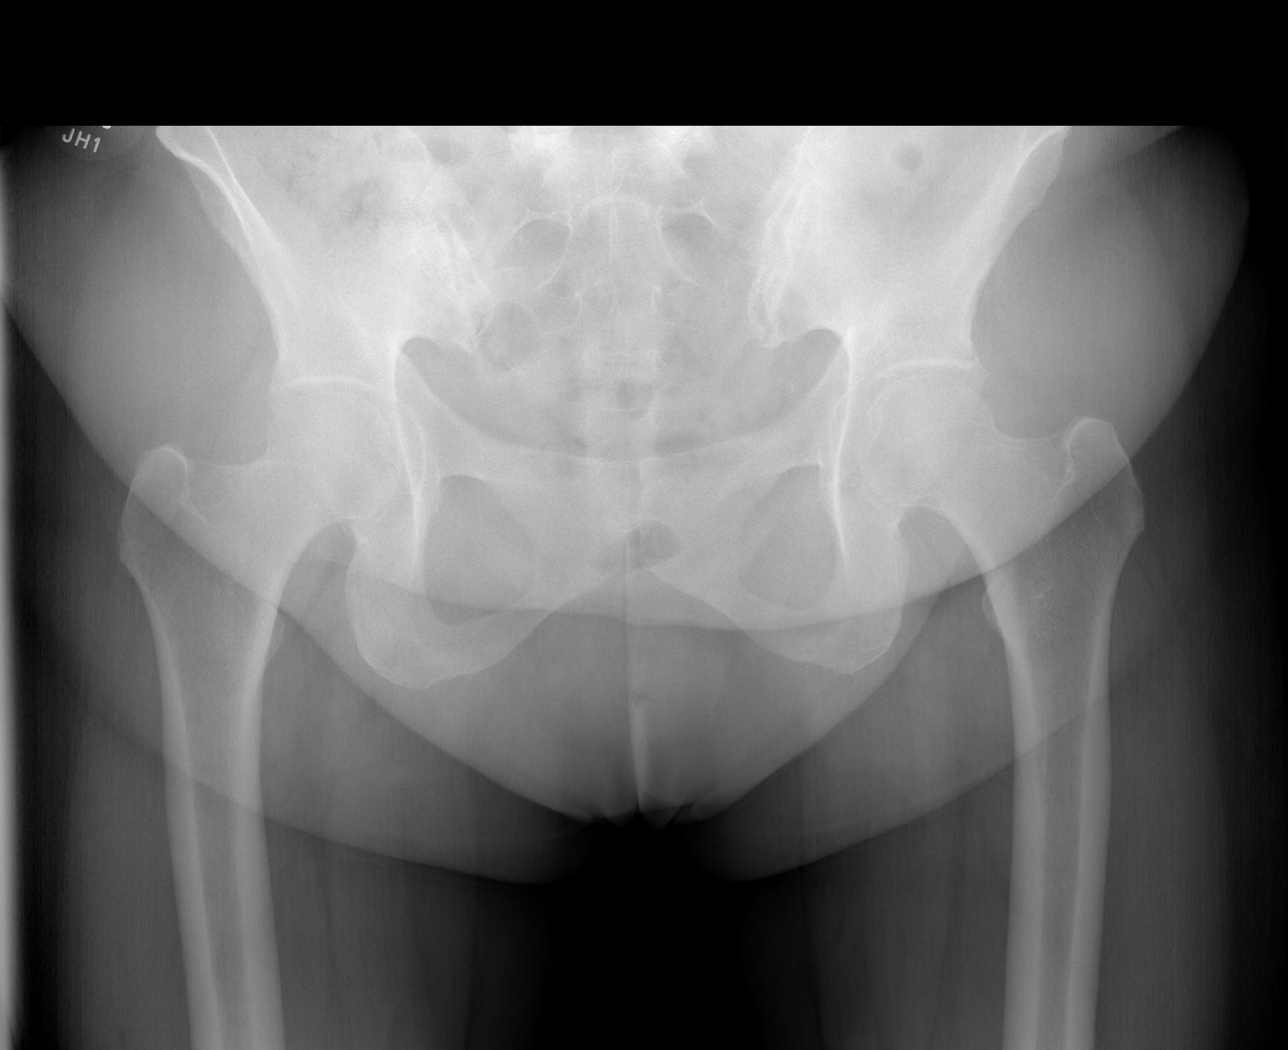

[t hip frog leg left]
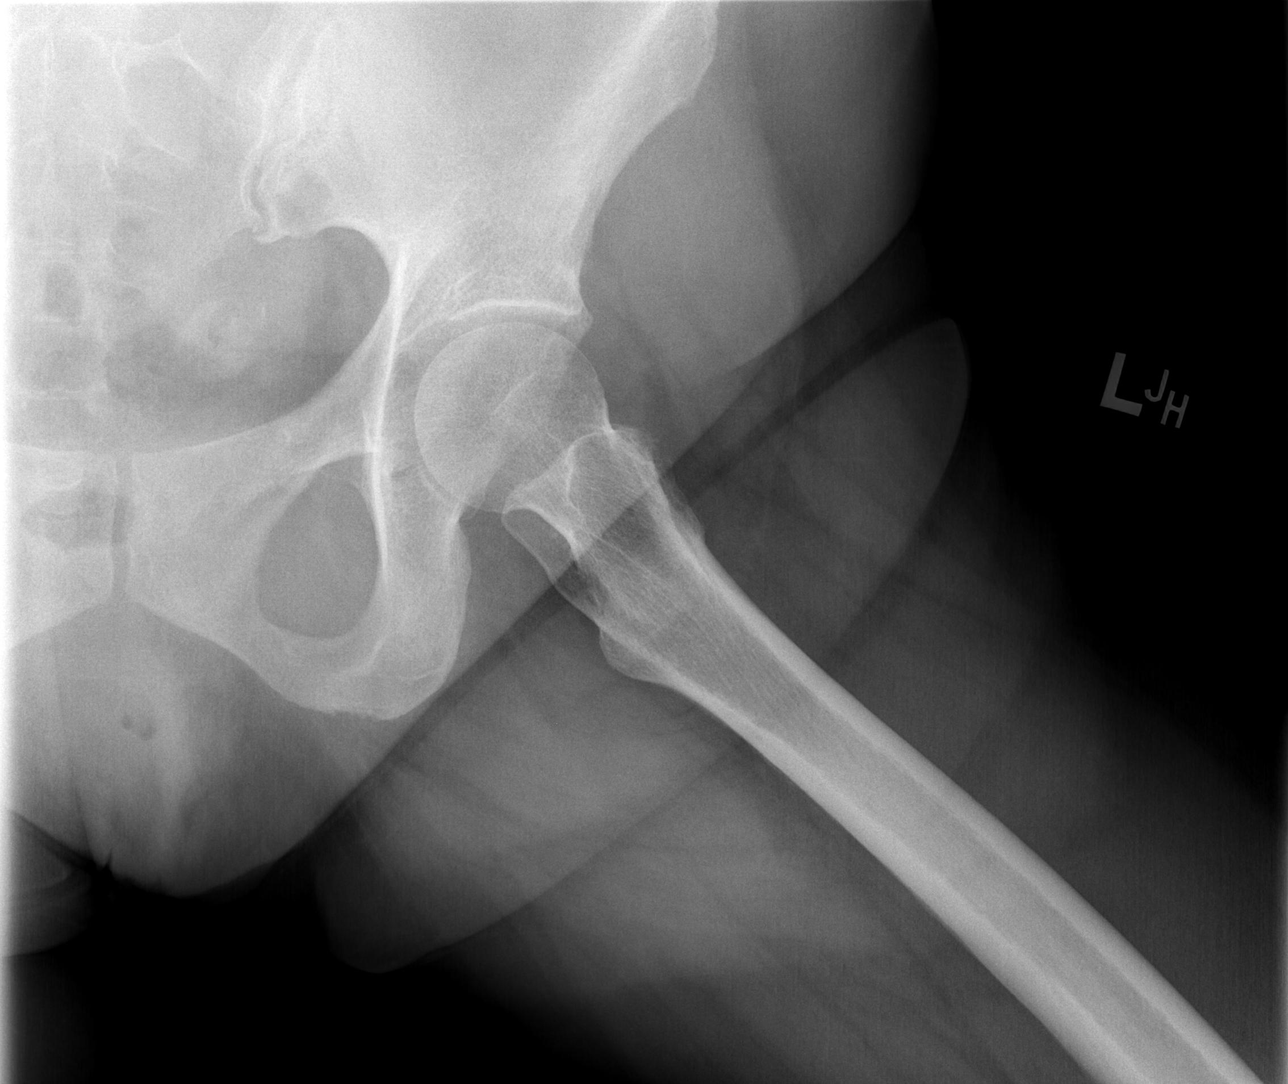

[t hip frog leg right]
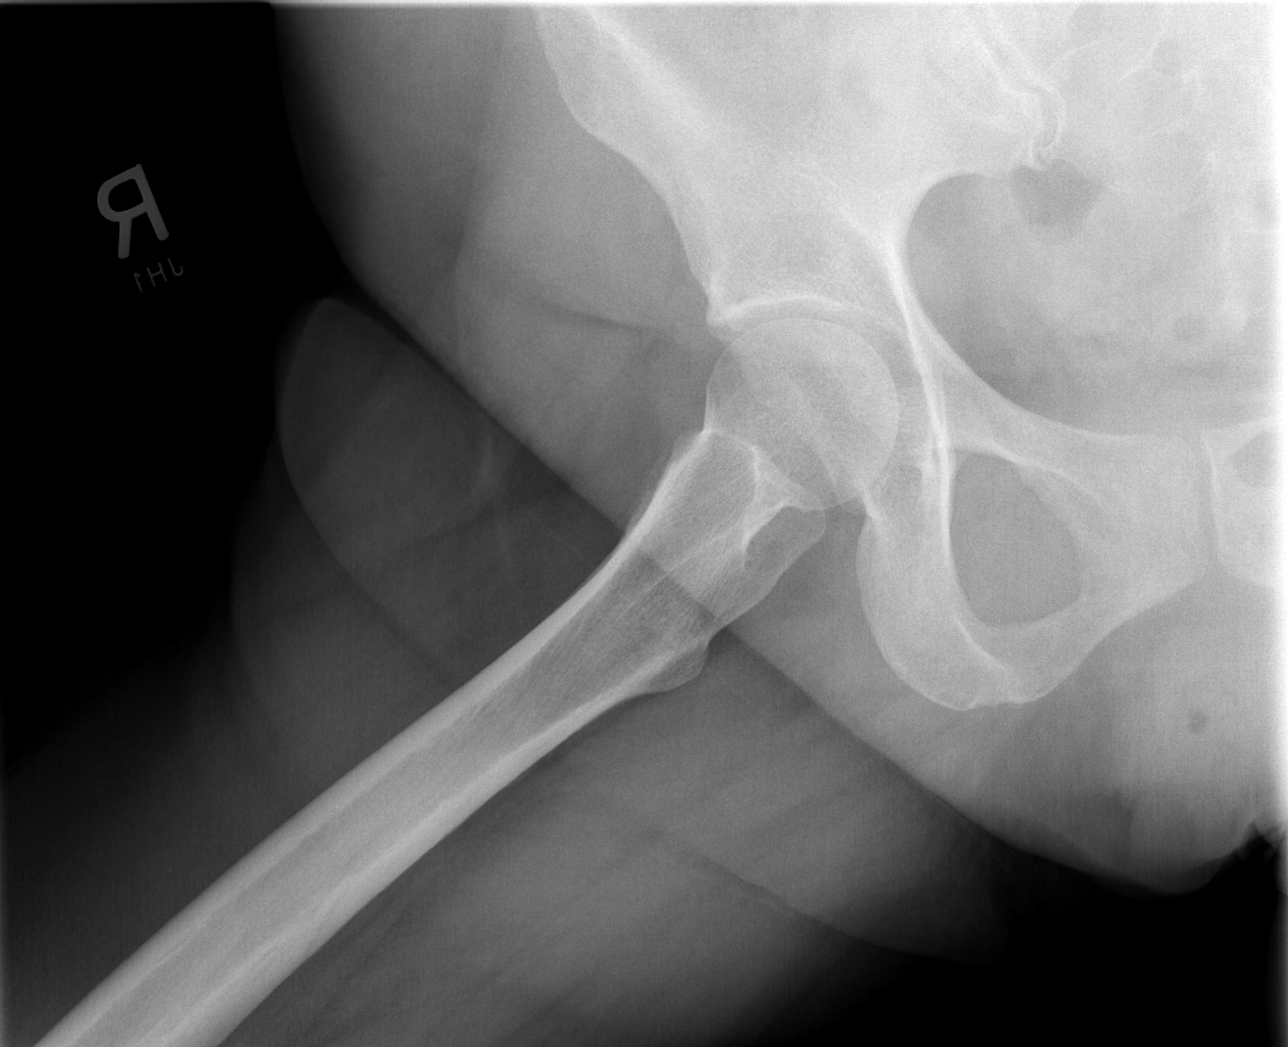

[3 of 3 positions shown; findings below may reference images not displayed]

FINDINGS: There is no evidence of hip fracture or dislocation. There is mild
hip joint space loss bilaterally. There is mild right acetabular
spurring. There is focal lucency adjacent to the inferior left
sacroiliac joint which is indeterminate, possibly representing bowel
gas but a focal lesion cannot be excluded. The visualized pelvis
and lower lumbar spine are unremarkable. Nonobstructive bowel gas
pattern.
IMPRESSION: 1. No acute osseous abnormality in the bilateral hips. Mild right
greater than left degenerative changes.

2. Focal area of lucency adjacent to the inferior left sacroiliac
joint of uncertain etiology, possibly representing bowel gas but
cannot exclude a lucent bony lesion. MRI of the pelvis may be
helpful for further evaluation.

## 2018-11-06 ENCOUNTER — Ambulatory Visit: Payer: PRIVATE HEALTH INSURANCE | Admitting: Podiatry

## 2018-12-04 ENCOUNTER — Ambulatory Visit: Payer: 59 | Admitting: Cardiovascular Disease

## 2018-12-04 NOTE — Progress Notes (Signed)
12/09/2018 Lindsay Rios   [birth year]  [id number]  Primary Physician [name], PA-C Primary Cardiologist: Dr. Johnsie Cancel   Reason for Visit/CC: f/u for nonobstructive CAD  HPI:  53 y.o. HTN, HLD, DM-2 and depression. Normal cath 2015 Re\current chest pain 2018 r/o normal CXR  Coronary CT showed a calcium score of 2. Essentially normal right dominant coronary arteries. LAD with isolated less than 20% calcified plaque and mid vessel bridging. The radiologist interpretation of study also felt low risk for PE. She was however noted to have a 4 mm LLL pulmonary nodule.   Has had issues with dizziness Carotid duplex negative for disease  BP on low side and with glycosuria may have low volume  34 yo daughter living with her now Interested in [employer] classes on line  Cardiac Studies [facility] 2015 Procedural Findings: Hemodynamics: AO 110/64 LV 109/11  Coronary angiography: Coronary dominance: right  Left mainstem: Widely patent left main without obstructive  Left anterior descending (LAD): Patent to the LV apex. The proximal vessel and first diagonal are widely patent. The mid-vessel has very mild narrowing that I suspect is related to an intramyocardial segment but could also be atherosclerotic with approximately 40% associated stenosis.  Left circumflex (LCx): Widely patent, smooth vessel without obstruction. The first OM is widely patent.  Right coronary artery (RCA): Minor proximal irregularity without stenosis. The vessel is large, dominant, without significant stenosis. The PDA and PLA branches are patent.   Left ventriculography: Left ventricular systolic function is normal, LVEF is estimated at 55-65%, there is no significant mitral regurgitation   Estimated Blood Loss: minimal  Final Conclusions:   1. Patent coronary arteries with nonobstructive stenosis of the mid-LAD, minimal plaque in the proximal RCA, and otherwise no disease identified. 2. Normal LV  systolic function  Recommendations: Suspect noncardiac chest pain. Risk reduction measures.  Coronary CTA 03/2016  FINDINGS: Non-cardiac: See separate report from [facility]. No significant findings on limited lung and soft tissue windows.  Calcium Score:  2 isolated calcium seen in proximal LAD  Coronary Arteries: Right dominant with no anomalies  LM: Normal  LAD: Less than 20% calcified plaque in proximal LAD. 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 LAD with isolated less than 20% calcified plaque and mid vessel bridging  2) Calcium Score of 2 which is 89th percentile for age and sex matched controls  3) See radiology addendum regarding low risk pulmonary nodule   Current Meds  Medication Sig  . aspirin 81 MG chewable tablet Chew 81 mg by mouth daily.   Marland Kitchen atorvastatin (LIPITOR) 80 MG tablet Take 80 mg by mouth daily.  . cetirizine (ZYRTEC) 10 MG tablet Take 10 mg by mouth daily.  . Cholecalciferol (PA VITAMIN D-3 GUMMY PO) Chew one (1) gummy by mouth daily.  Marland Kitchen dicyclomine (BENTYL) 20 MG tablet TAKE 1 TABLET(20 MG) BY MOUTH FOUR TIMES DAILY BEFORE MEALS AND AT BEDTIME  . empagliflozin (JARDIANCE) 25 MG TABS tablet Take 25 mg by mouth daily.  . Insulin Glargine (BASAGLAR KWIKPEN) 100 UNIT/ML SOPN Inject 30 Units into the skin daily.  Marland Kitchen liraglutide (VICTOZA) 18 MG/3ML SOPN Inject 0.8 mg into the skin every morning.  Marland Kitchen lisinopril (PRINIVIL,ZESTRIL) 20 MG tablet Take  1 tablet (20 mg total) by mouth daily.  . Loperamide HCl (IMODIUM PO) Take 1-2 tablets by mouth daily.  . Multiple Vitamins-Minerals (ALIVE WOMENS GUMMY PO) Chew one (1) gummy by mouth daily.  . nitroGLYCERIN (NITROSTAT) 0.4 MG SL tablet Place 1  tablet (0.4 mg total) under the tongue every 5 (five) minutes as needed for chest pain.   Current Facility-Administered Medications for the 12/09/18 encounter (Office Visit) with Josue Hector, MD  Medication  . 0.9 %  sodium chloride infusion   Allergies  Allergen Reactions  . Sulfa Antibiotics Nausea Only    REACTION: nausea   Past Medical History:  Diagnosis Date  . ADD (attention deficit disorder)   . Allergy   . Depression   . Ejection fraction   . Gestational diabetes 1999; 2002  . History of chicken pox   . 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 Hyperlipidemia   . Hypertension   . Numbness and tingling   . Osteopenia   . Pneumonia 2012  . Sleep apnea with use of continuous positive airway pressure (CPAP)   . Type II diabetes mellitus ([place])    "borderline; I'm on Metformin"   Family History  Problem Relation Age of Onset  . Hyperlipidemia Mother   . Hyperlipidemia Father   . Hypertension Father   . Diabetes Father   . Heart disease Father   . Stroke Father   . Cancer Maternal Grandmother        Colon Cancer  . Alzheimer's disease Maternal Grandmother   . Colon cancer Maternal Grandmother   . Heart disease Brother 40       Myocardial Infarction  . Esophageal cancer Neg Hx   . Stomach cancer Neg Hx   . Rectal cancer Neg Hx    Past Surgical History:  Procedure Laterality Date  . ANAL RECTAL MANOMETRY N/A 03/17/2015   Procedure: ANO RECTAL MANOMETRY;  Surgeon: Mauri Pole, MD;  Location: [facility] ENDOSCOPY;  Service: Endoscopy;  Laterality: N/A;  . LEFT HEART CATHETERIZATION WITH CORONARY ANGIOGRAM N/A 12/22/2013   Procedure: LEFT HEART CATHETERIZATION WITH CORONARY ANGIOGRAM;  Surgeon: Jettie Booze, MD;  Location: [facility] CATH LAB;  Service: Cardiovascular;  Laterality: N/A;  . SHOULDER ARTHROSCOPY W/ ROTATOR CUFF REPAIR Right 1990's  . VAGINAL HYSTERECTOMY  2013   HPV/Cervical Dysplasia; partial   Social History   Socioeconomic History  . Marital  status: Divorced    Spouse name: [name] (Separated)   . Number of children: 2  . Years of education: 57  . Highest education level: Not on file  Occupational History  . 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 Occupation: Psychologist, sport and exercise  Social Needs  . Financial resource strain: Not on file  . Food insecurity    Worry: Not on file    Inability: Not on file  . Transportation needs    Medical: Not on file    Non-medical: Not on file  Tobacco Use  . Smoking status: Never Smoker  . Smokeless tobacco: Never Used  Substance and Sexual Activity  . Alcohol use: No    Alcohol/week: 0.0 standard drinks    Comment: 12/19/2013 "might have a drink a couple times/year"  . Drug use: No  . Sexual activity: Not Currently    Partners: Male  Lifestyle  . Physical activity    Days per week: Not on file    Minutes per session: Not on file  .  Stress: Not on file  Relationships  . Social Herbalist on phone: Not on file    Gets together: Not on file    Attends religious service: Not on file    Active member of club or organization: Not on file    Attends meetings of clubs or organizations: Not on file    Relationship status: Not on file  . Intimate partner violence    Fear of current or ex partner: Not on file    Emotionally abused: Not on file    Physically abused: Not on file    Forced sexual activity: Not on file  Other Topics Concern  . Not on file  Social History Narrative   Marital Status: Separated [name]) Significant Other [name])    Children:  Son (1) Daughter (1)    Pets: Dogs (2), Cat (1)    Living Situation: Lives with children.   Occupation: Full- Time Student    Education: [place]    Tobacco Use: She has never smoked.     Alcohol Use:  Rarely   Drug Use:  None   Diet:  Regular   Exercise:  None   Hobbies: Shopping        Review of Systems: General: negative for chills, fever, night sweats or weight changes.   Cardiovascular: negative for chest pain, dyspnea on exertion, edema, orthopnea, palpitations, paroxysmal nocturnal dyspnea or shortness of breath Dermatological: negative for rash Respiratory: negative for cough or wheezing Urologic: negative for hematuria Abdominal: negative for nausea, vomiting, diarrhea, bright red blood per rectum, melena, or hematemesis Neurologic: negative for visual changes, syncope, or dizziness All other systems reviewed and are otherwise negative except as noted above.   Physical Exam:  Blood pressure 100/72, pulse 80, height 5\' 3"  (1.6 m), weight 161 lb (73 kg), SpO2 98 %.  Affect appropriate Healthy:  appears stated age 49: normal Neck supple with no adenopathy JVP normal no bruits no thyromegaly Lungs clear with no wheezing and good diaphragmatic motion Heart:  S1/S2 no murmur, no rub, gallop or click PMI normal Abdomen: benighn, BS positve, no tenderness, no AAA no bruit.  No HSM or HJR Distal pulses intact with no bruits No edema Neuro non-focal Skin warm and dry No muscular weakness   EKG  08/12/18 SR rate 93 normal   ASSESSMENT AND PLAN:   1. Nonobstructive CAD: Less than 20% calcified plaque in proximal LAD. 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 No dyspnea. Continue medical therapy for disease progression F/U ETT March 2021 3 years post cardiac CT given poorly controlled DM  2. HLD: LDL on zetia and statin labs with primary    3. Dizziness w/ Balance Problems: not orthostatic carotids done 04/26/17 no obstructive disease f/u primary   4.HTN: BP low will cut lisinopril back to 10 mg daily given problem #3  5. DM: poorly controlled A1c >8 f/u Dr Buddy Duty    Follow-Up in 1 year   Jenkins Rouge

## 2018-12-09 ENCOUNTER — Other Ambulatory Visit: Payer: Self-pay

## 2018-12-09 ENCOUNTER — Encounter: Payer: Self-pay | Admitting: Cardiovascular Disease

## 2018-12-09 ENCOUNTER — Ambulatory Visit (INDEPENDENT_AMBULATORY_CARE_PROVIDER_SITE_OTHER): Payer: 59 | Admitting: Cardiovascular Disease

## 2018-12-09 VITALS — BP 100/72 | HR 80 | Ht 63.0 in | Wt 161.0 lb

## 2018-12-09 DIAGNOSIS — E785 Hyperlipidemia, unspecified: Secondary | ICD-10-CM

## 2018-12-09 DIAGNOSIS — I10 Essential (primary) hypertension: Secondary | ICD-10-CM

## 2018-12-09 DIAGNOSIS — I2510 Atherosclerotic heart disease of native coronary artery without angina pectoris: Secondary | ICD-10-CM

## 2018-12-09 MED ORDER — LISINOPRIL 10 MG PO TABS: 10.0000 mg | ORAL_TABLET | Freq: Every day | ORAL | 3 refills | Status: DC

## 2018-12-09 NOTE — Patient Instructions (Addendum)
Medication Instructions:   *If you need a refill on your cardiac medications before your next appointment, please call your pharmacy*  Lab Work:  If you have labs (blood work) drawn today and your tests are completely normal, you will receive your results only by: Marland Kitchen MyChart Message (if you have MyChart) OR . A paper copy in the mail If you have any lab test that is abnormal or we need to change your treatment, we will call you to review the results.  Testing/Procedures: Your physician has requested that you have an exercise tolerance test on same day as office visit. For further information please visit [email or web address]. Please also follow instruction sheet, as given.  Follow-Up: At [facility], you and your health needs are our priority.  As part of our continuing mission to provide you with exceptional heart care, we have created designated Provider Care Teams.  These Care Teams include your primary Cardiologist (physician) and Advanced Practice Providers (APPs -  Physician Assistants and Nurse Practitioners) who all work together to provide you with the care you need, when you need it.  Your next appointment:   3 months on same day as exercise stress test  The format for your next appointment:   In Person  Provider:   You may see Dr. Johnsie Cancel or one of the following Advanced Practice Providers on your designated Care Team:    Truitt Merle, NP  Cecilie Kicks, NP  Kathyrn Drown, NP

## 2018-12-09 NOTE — Addendum Note (Signed)
Addended by: Aris Georgia, Chaunta Bejarano L on: 12/09/2018 09:40 AM   Modules accepted: Orders

## 2019-02-03 ENCOUNTER — Ambulatory Visit: Payer: PRIVATE HEALTH INSURANCE | Admitting: Podiatry

## 2019-02-03 ENCOUNTER — Encounter: Payer: Self-pay | Admitting: Podiatry

## 2019-02-03 ENCOUNTER — Other Ambulatory Visit: Payer: Self-pay

## 2019-02-03 VITALS — BP 126/80 | HR 93

## 2019-02-03 DIAGNOSIS — E119 Type 2 diabetes mellitus without complications: Secondary | ICD-10-CM

## 2019-02-03 DIAGNOSIS — Z794 Long term (current) use of insulin: Secondary | ICD-10-CM

## 2019-02-03 NOTE — Patient Instructions (Signed)
Diabetes Mellitus and Foot Care Foot care is an important part of your health, especially when you have diabetes. Diabetes may cause you to have problems because of poor blood flow (circulation) to your feet and legs, which can cause your skin to:  Become thinner and drier.  Break more easily.  Heal more slowly.  Peel and crack. You may also have nerve damage (neuropathy) in your legs and feet, causing decreased feeling in them. This means that you may not notice minor injuries to your feet that could lead to more serious problems. Noticing and addressing any potential problems early is the best way to prevent future foot problems. How to care for your feet Foot hygiene  Wash your feet daily with warm water and mild soap. Do not use hot water. Then, pat your feet and the areas between your toes until they are completely dry. Do not soak your feet as this can dry your skin.  Trim your toenails straight across. Do not dig under them or around the cuticle. File the edges of your nails with an emery board or nail file.  Apply a moisturizing lotion or petroleum jelly to the skin on your feet and to dry, brittle toenails. Use lotion that does not contain alcohol and is unscented. Do not apply lotion between your toes. Shoes and socks  Wear clean socks or stockings every day. Make sure they are not too tight. Do not wear knee-high stockings since they may decrease blood flow to your legs.  Wear shoes that fit properly and have enough cushioning. Always look in your shoes before you put them on to be sure there are no objects inside.  To break in new shoes, wear them for just a few hours a day. This prevents injuries on your feet. Wounds, scrapes, corns, and calluses  Check your feet daily for blisters, cuts, bruises, sores, and redness. If you cannot see the bottom of your feet, use a mirror or ask someone for help.  Do not cut corns or calluses or try to remove them with medicine.  If you  find a minor scrape, cut, or break in the skin on your feet, keep it and the skin around it clean and dry. You may clean these areas with mild soap and water. Do not clean the area with peroxide, alcohol, or iodine.  If you have a wound, scrape, corn, or callus on your foot, look at it several times a day to make sure it is healing and not infected. Check for: ? Redness, swelling, or pain. ? Fluid or blood. ? Warmth. ? Pus or a bad smell. General instructions  Do not cross your legs. This may decrease blood flow to your feet.  Do not use heating pads or hot water bottles on your feet. They may burn your skin. If you have lost feeling in your feet or legs, you may not know this is happening until it is too late.  Protect your feet from hot and cold by wearing shoes, such as at the beach or on hot pavement.  Schedule a complete foot exam at least once a year (annually) or more often if you have foot problems. If you have foot problems, report any cuts, sores, or bruises to your health care provider immediately. Contact a health care provider if:  You have a medical condition that increases your risk of infection and you have any cuts, sores, or bruises on your feet.  You have an injury that is not   healing.  You have redness on your legs or feet.  You feel burning or tingling in your legs or feet.  You have pain or cramps in your legs and feet.  Your legs or feet are numb.  Your feet always feel cold.  You have pain around a toenail. Get help right away if:  You have a wound, scrape, corn, or callus on your foot and: ? You have pain, swelling, or redness that gets worse. ? You have fluid or blood coming from the wound, scrape, corn, or callus. ? Your wound, scrape, corn, or callus feels warm to the touch. ? You have pus or a bad smell coming from the wound, scrape, corn, or callus. ? You have a fever. ? You have a red line going up your leg. Summary  Check your feet every day  for cuts, sores, red spots, swelling, and blisters.  Moisturize feet and legs daily.  Wear shoes that fit properly and have enough cushioning.  If you have foot problems, report any cuts, sores, or bruises to your health care provider immediately.  Schedule a complete foot exam at least once a year (annually) or more often if you have foot problems. This information is not intended to replace advice given to you by your health care provider. Make sure you discuss any questions you have with your health care provider. Document Revised: 10/09/2018 Document Reviewed: 02/18/2016 Elsevier Patient Education  2020 [employer]  

## 2019-02-06 NOTE — Progress Notes (Signed)
Subjective: Lindsay Rios presents today referred by [name], PA-C for diabetic foot evaluation.  Patient relates 18 month history of diabetes.  Patient denies any history of foot wounds.  Patient relates tingling at times in left great toe. Denies any history of numbness,  burning, or pins/needles sensations.  She states she does get pedicures 2-3 times per year.  Past Medical History:  Diagnosis Date  . ADD (attention deficit disorder)   . Allergy   . Depression   . Ejection fraction   . Gestational diabetes 1999; 2002  . History of chicken pox   . 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 Hyperlipidemia   . Hypertension   . Numbness and tingling   . Osteopenia   . Pneumonia 2012  . Sleep apnea with use of continuous positive airway pressure (CPAP)   . Type II diabetes mellitus ([place])    "borderline; I'm on Metformin"    Patient Active Problem List   Diagnosis Date Noted  . Chest pain radiating to jaw 04/12/2016  . OSA on CPAP 04/12/2016  . Family history of early CAD 04/12/2016  . Vitamin D deficiency 06/07/2015  . Ulnar neuropathy at elbow of left upper extremity 05/25/2015  . Constipation   . Spondylosis, cervical, with myelopathy 01/11/2015  . HTN (hypertension) 09/09/2014  . Plantar fasciitis, bilateral 09/09/2014  . HLD (hyperlipidemia) 02/22/2014  . DM type 2 (diabetes mellitus, type 2) ([place]) 02/09/2014  . Depression 06/14/2013  . Stress and adjustment reaction 06/14/2013  . Attention or concentration deficit 06/02/2012  . Allergic rhinitis 09/25/2006    Past Surgical History:  Procedure Laterality Date  . ANAL RECTAL MANOMETRY N/A 03/17/2015   Procedure: ANO RECTAL MANOMETRY;  Surgeon: Mauri Pole, MD;  Location: [facility];  Service: Endoscopy;  Laterality: N/A;  . LEFT HEART CATHETERIZATION WITH CORONARY ANGIOGRAM N/A 12/22/2013   Procedure: LEFT HEART CATHETERIZATION WITH CORONARY ANGIOGRAM;  Surgeon: Jettie Booze, MD;   Location: [facility] CATH LAB;  Service: Cardiovascular;  Laterality: N/A;  . SHOULDER ARTHROSCOPY W/ ROTATOR CUFF REPAIR Right 1990's  . VAGINAL HYSTERECTOMY  2013   HPV/Cervical Dysplasia; partial    Current Outpatient Medications on File Prior to Visit  Medication Sig Dispense Refill  . aspirin 81 MG chewable tablet Chew 81 mg by mouth daily.     Marland Kitchen atorvastatin (LIPITOR) 80 MG tablet Take 80 mg by mouth daily.    . cetirizine (ZYRTEC) 10 MG tablet Take 10 mg by mouth daily.    . Cholecalciferol (PA VITAMIN D-3 GUMMY PO) Chew one (1) gummy by mouth daily.    Marland Kitchen dicyclomine (BENTYL) 20 MG tablet TAKE 1 TABLET(20 MG) BY MOUTH FOUR TIMES DAILY BEFORE MEALS AND AT BEDTIME 120 tablet 11  . empagliflozin (JARDIANCE) 25 MG TABS tablet Take 25 mg by mouth daily.    . Insulin Glargine (BASAGLAR KWIKPEN) 100 UNIT/ML SOPN Inject 30 Units into the skin daily.    Marland Kitchen liraglutide (VICTOZA) 18 MG/3ML SOPN Inject 0.8 mg into the skin every morning.    Marland Kitchen lisinopril (ZESTRIL) 10 MG tablet Take 1 tablet (10 mg total) by mouth daily. 90 tablet 3  . Loperamide HCl (IMODIUM PO) Take 1-2 tablets by mouth daily.    . Multiple Vitamins-Minerals (ALIVE WOMENS GUMMY PO) Chew one (1) gummy by mouth daily.    . nitroGLYCERIN (NITROSTAT) 0.4 MG SL tablet Place 1 tablet (0.4 mg total) under the tongue every 5 (five) minutes as needed for chest  pain. 30 tablet 12   Current Facility-Administered Medications on File Prior to Visit  Medication Dose Route Frequency Provider Last Rate Last Admin  . 0.9 %  sodium chloride infusion  500 mL Intravenous Once Ladene Artist, MD         Allergies  Allergen Reactions  . Sulfa Antibiotics Nausea Only    REACTION: nausea   Social History   Occupational History  . 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 Occupation: Psychologist, sport and exercise  Tobacco Use  . Smoking status: Never Smoker  . Smokeless tobacco: Never Used  Substance and Sexual Activity  . Alcohol use: No     Alcohol/week: 0.0 standard drinks    Comment: 12/19/2013 "might have a drink a couple times/year"  . Drug use: No  . Sexual activity: Not Currently    Partners: Male    Family History  Problem Relation Age of Onset  . Hyperlipidemia Mother   . Hyperlipidemia Father   . Hypertension Father   . Diabetes Father   . Heart disease Father   . Stroke Father   . Cancer Maternal Grandmother        Colon Cancer  . Alzheimer's disease Maternal Grandmother   . Colon cancer Maternal Grandmother   . Heart disease Brother 40       Myocardial Infarction  . Esophageal cancer Neg Hx   . Stomach cancer Neg Hx   . Rectal cancer Neg Hx     Immunization History  Administered Date(s) Administered  . Influenza Whole 12/26/2006, 11/19/2008  . Influenza,inj,Quad PF,6+ Mos 10/28/2013, 12/10/2014  . Influenza-Unspecified 11/27/2012, 10/28/2013  . PPD Test 05/27/2012, 05/27/2012, 12/09/2013  . Pneumococcal Conjugate-13 07/02/2014  . Tdap 05/23/2012   Review of systems: Positive Findings in bold print.  Constitutional:  chills, fatigue, fever, sweats, weight change Communication: Optometrist, sign Ecologist, hand writing, iPad/Android device Head: headaches, head injury Eyes: changes in vision, eye pain, glaucoma, cataracts, macular degeneration, diplopia, glare,  light sensitivity, eyeglasses or contacts, blindness Ears nose mouth throat: hearing impaired, hearing aids,  ringing in ears, deaf, sign language,  vertigo, nosebleeds,  rhinitis,  cold sores, snoring, swollen glands Cardiovascular: HTN, edema, arrhythmia, pacemaker in place, defibrillator in place, chest pain/tightness, chronic anticoagulation, blood clot, heart failure, MI Peripheral Vascular: leg cramps, varicose veins, blood clots, lymphedema, varicosities Respiratory:  difficulty breathing, denies congestion, SOB, wheezing, cough, emphysema Gastrointestinal: change in appetite or weight, abdominal pain, constipation,  diarrhea, nausea, vomiting, vomiting blood, change in bowel habits, abdominal pain, jaundice, rectal bleeding, hemorrhoids, GERD Genitourinary:  nocturia,  pain on urination, polyuria,  blood in urine, Foley catheter, urinary urgency, ESRD on hemodialysis Musculoskeletal: amputation, cramping, stiff joints, painful joints, decreased joint motion, fractures, OA, gout, hemiplegia, paraplegia, uses cane, wheelchair bound, uses walker, uses rollator Skin: changes in toenails, color change, dryness, itching, mole changes,  rash, wound(s) Neurological: headaches, numbness in feet, paresthesias in feet, burning in feet, fainting,  seizures, change in speech,  headaches, memory problems/poor historian, cerebral palsy, weakness, paralysis, CVA, TIA Endocrine: diabetes, hypothyroidism, hyperthyroidism,  goiter, dry mouth, flushing, heat intolerance,  cold intolerance,  excessive thirst, denies polyuria,  nocturia Hematological:  easy bleeding, excessive bleeding, easy bruising, enlarged lymph nodes, on long term blood thinner, history of past transusions Allergy/immunological:  hives, eczema, frequent infections, multiple drug allergies, seasonal allergies, transplant recipient, multiple food allergies Psychiatric:  anxiety, depression, mood disorder, suicidal ideations, hallucinations, insomnia  Objective: Vitals:   02/03/19 1540  BP:  126/80  Pulse: 93   Vascular Examination: Capillary refill time immediate x 10 digits.  Dorsalis pedis present b/l.  Posterior tibial pulses present b/l.  Digital hair  present x 10 digits.  Skin temperature gradient WNL b/l.  Dermatological Examination: Skin with normal turgor, texture and tone b/l.  Toenails 1-5 b/l adequate length.   Musculoskeletal: Muscle strength 5/5 to all LE muscle groups.   No gross bony deformities.  No crepitus, pain or joint discomfort with passive/active ROM b/l.  Neurological: Sensation intact 5/5 b/l with 10 gram  monofilament.  Vibratory sensation intact b/l.  Last A1c: 9.6%  Assessment: 1. Painful onychomycosis toenails 1-5 b/l  2. NIDDM  Plan: 1. Discussed diabetic foot care principles. Literature dispensed on today.  2. Discussed neuropathy and its correlation to uncontrolled blood sugars. Encouraged tighter glucose control to prevent worsening of symptoms.  3. Discussed dangers of pedicures with patient. In the event she decides to continue to get pedicures, I advised her to take her own instruments. Do not allow anyone to use sharp blades on her feet, and to take her own nail polish. Advised her she's sharing nail polish with the community as everyone chooses from the same supply of nail polish. 4. Patient to continue soft, supportive shoe gear. 5. Patient to report any pedal injuries to medical professional immediately. 6. Follow up 1 year.  7. Patient/POA to call should there be a concern in the interim.

## 2019-03-18 DIAGNOSIS — K589 Irritable bowel syndrome without diarrhea: Secondary | ICD-10-CM | POA: Insufficient documentation

## 2019-03-20 ENCOUNTER — Other Ambulatory Visit: Payer: Self-pay | Admitting: Obstetrics and Gynecology

## 2019-03-20 DIAGNOSIS — M8580 Other specified disorders of bone density and structure, unspecified site: Secondary | ICD-10-CM | POA: Insufficient documentation

## 2019-03-20 DIAGNOSIS — R928 Other abnormal and inconclusive findings on diagnostic imaging of breast: Secondary | ICD-10-CM

## 2019-03-26 ENCOUNTER — Other Ambulatory Visit: Payer: Self-pay

## 2019-03-26 ENCOUNTER — Ambulatory Visit
Admission: RE | Admit: 2019-03-26 | Discharge: 2019-03-26 | Disposition: A | Discharge status: Home / Self Care | Payer: 59 | Source: Ambulatory Visit | Attending: Obstetrics and Gynecology | Admitting: Obstetrics and Gynecology

## 2019-03-26 ENCOUNTER — Other Ambulatory Visit: Payer: Self-pay | Admitting: Obstetrics and Gynecology

## 2019-03-26 DIAGNOSIS — R928 Other abnormal and inconclusive findings on diagnostic imaging of breast: Secondary | ICD-10-CM

## 2019-03-26 DIAGNOSIS — R921 Mammographic calcification found on diagnostic imaging of breast: Secondary | ICD-10-CM

## 2019-03-26 IMAGING — MG DIGITAL DIAGNOSTIC UNILAT RIGHT W/ CAD
6 series · 6 of 6 positions shown · non-contrast
Comparison: Previous exam(s).

CLINICAL DATA: Patient recalled from screening for right breast
calcifications.

EXAM:
DIGITAL DIAGNOSTIC RIGHT MAMMOGRAM WITH CAD

[R ML (1 of 4)]
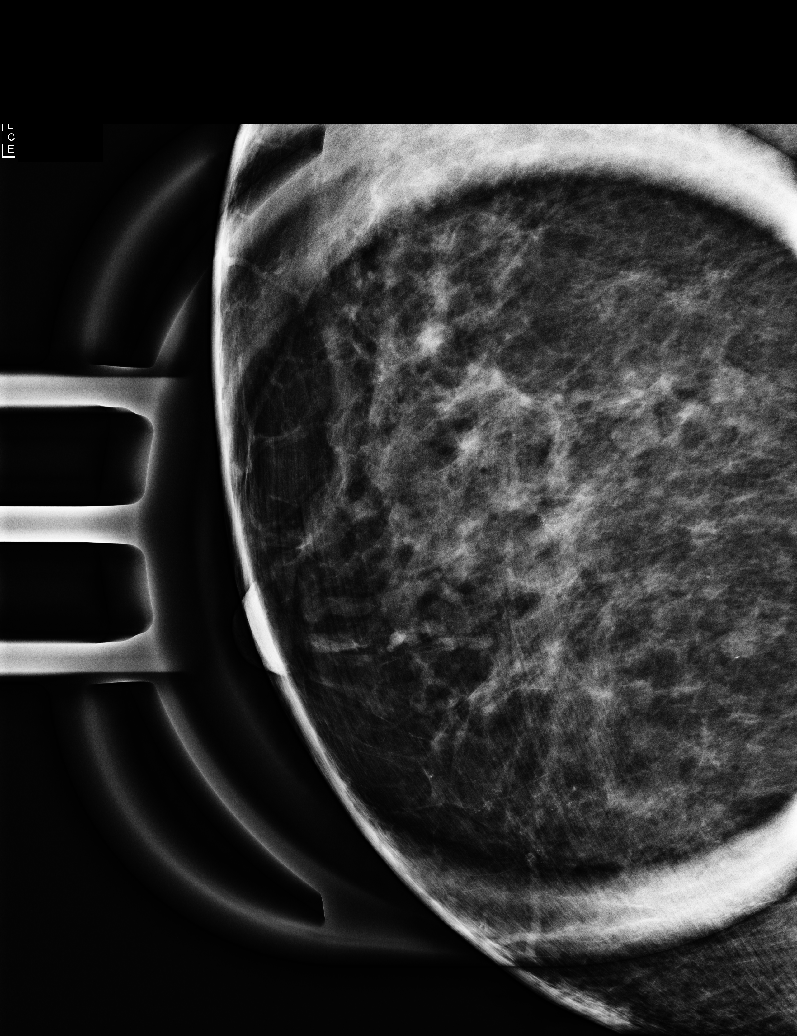

[R ML (2 of 4)]
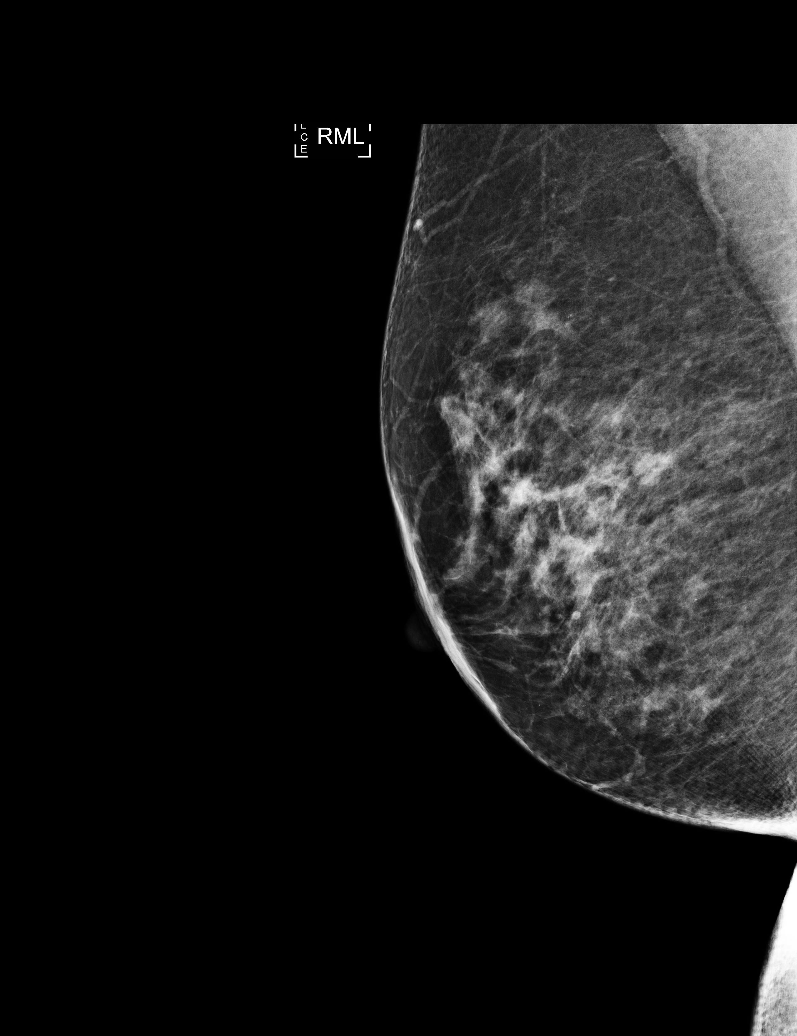

[R CC (1 of 2)]
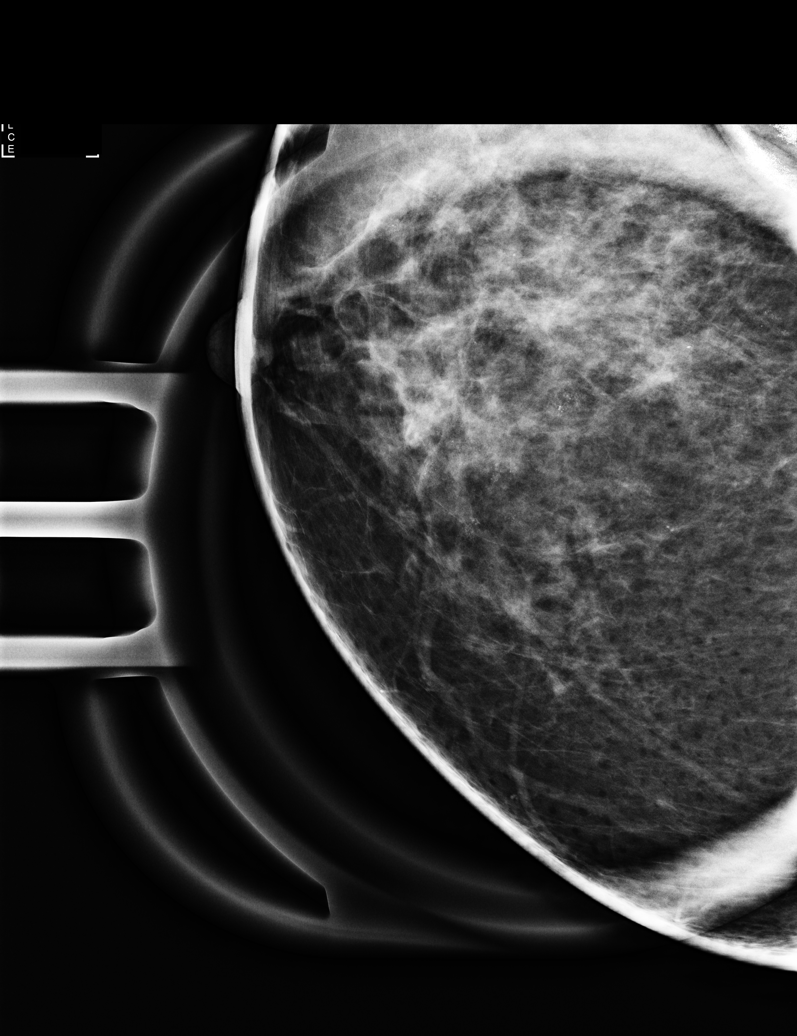

[R ML (3 of 4)]
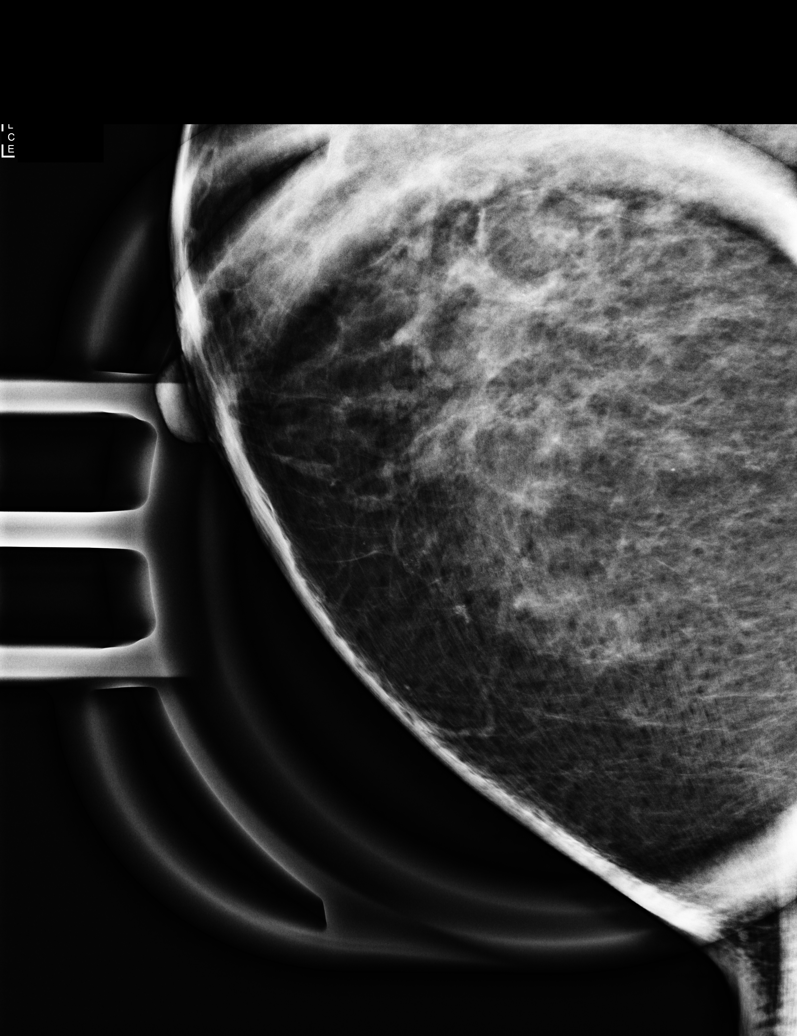

[R CC (2 of 2)]
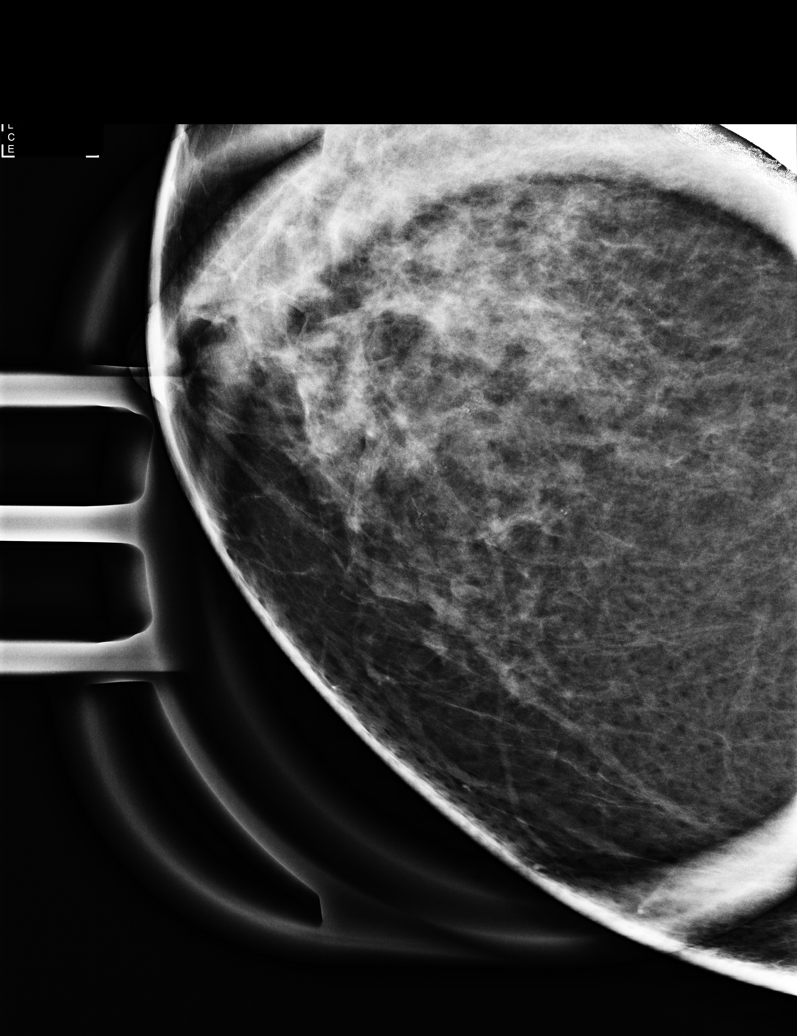

[R ML (4 of 4)]
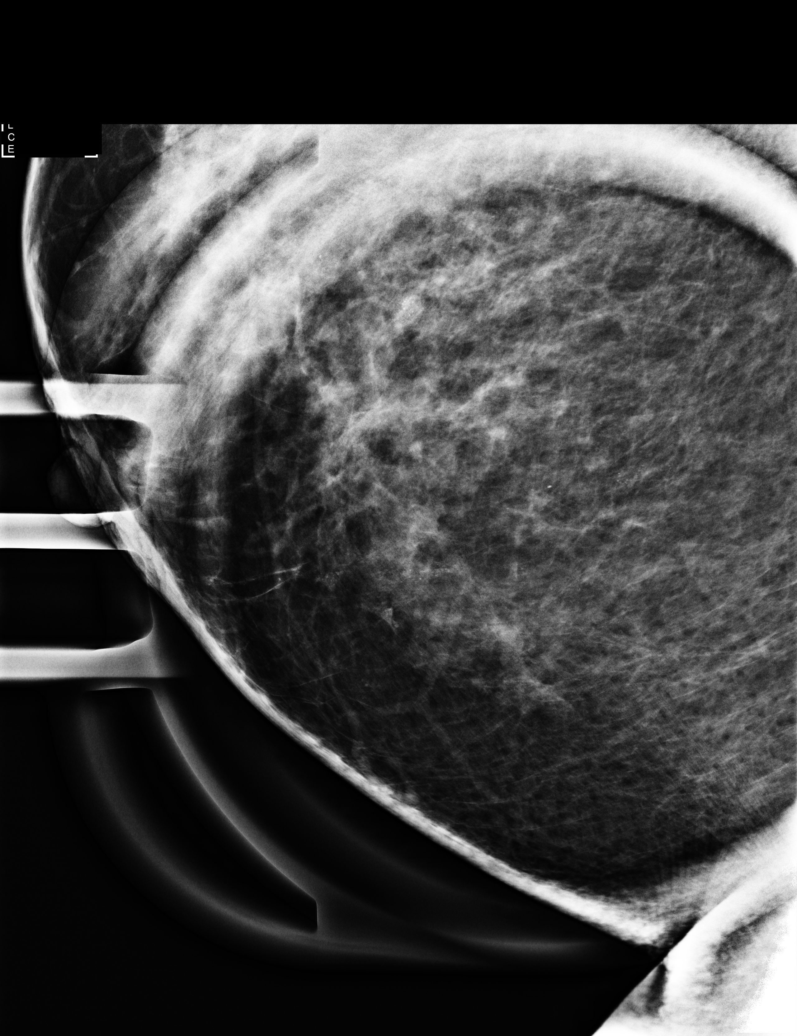

[6 of 6 positions shown; findings below may reference images not displayed]

ACR Breast Density Category b: There are scattered areas of
fibroglandular density.
FINDINGS: Magnification CC and true lateral views of the right breast were
obtained. On the magnification views there is a large segmental area
of loosely grouped round and punctate calcifications measuring
approximately 5 cm throughout the inferior slightly medial right
breast anterior to middle depth.

Mammographic images were processed with CAD.
IMPRESSION: Indeterminate segmental calcifications within the inferior slightly
medial right breast anterior to middle depth.

RECOMMENDATION:
Stereotactic guided core needle biopsy of 2 areas of the
indeterminate calcifications within the right breast. If both
demonstrate benign process, given the area of the calcifications,
six-month follow-up would be recommended to reassess the additional
groups of calcifications.

I have discussed the findings and recommendations with the patient.
If applicable, a reminder letter will be sent to the patient
regarding the next appointment.

BI-RADS CATEGORY  4: Suspicious.

## 2019-04-01 ENCOUNTER — Other Ambulatory Visit: Payer: Self-pay

## 2019-04-01 ENCOUNTER — Ambulatory Visit
Admission: RE | Admit: 2019-04-01 | Discharge: 2019-04-01 | Disposition: A | Discharge status: Home / Self Care | Payer: Medicaid Other | Source: Ambulatory Visit | Attending: Obstetrics and Gynecology | Admitting: Obstetrics and Gynecology

## 2019-04-01 DIAGNOSIS — R921 Mammographic calcification found on diagnostic imaging of breast: Secondary | ICD-10-CM

## 2019-04-01 IMAGING — MG MM BREAST BX W/ LOC DEV EA AD LESION IMAG BX SPEC STEREO GUIDE*R
7 of 9 series · 7 of 17 positions shown · non-contrast
Comparison: Previous exams.
COMPARISON: Previous exams.

Addendum:
CLINICAL DATA: 53-year-old female presenting for biopsy of
calcifications in the right breast.

EXAM:
RIGHT BREAST STEREOTACTIC CORE NEEDLE BIOPSY

[R (1 of 6)]
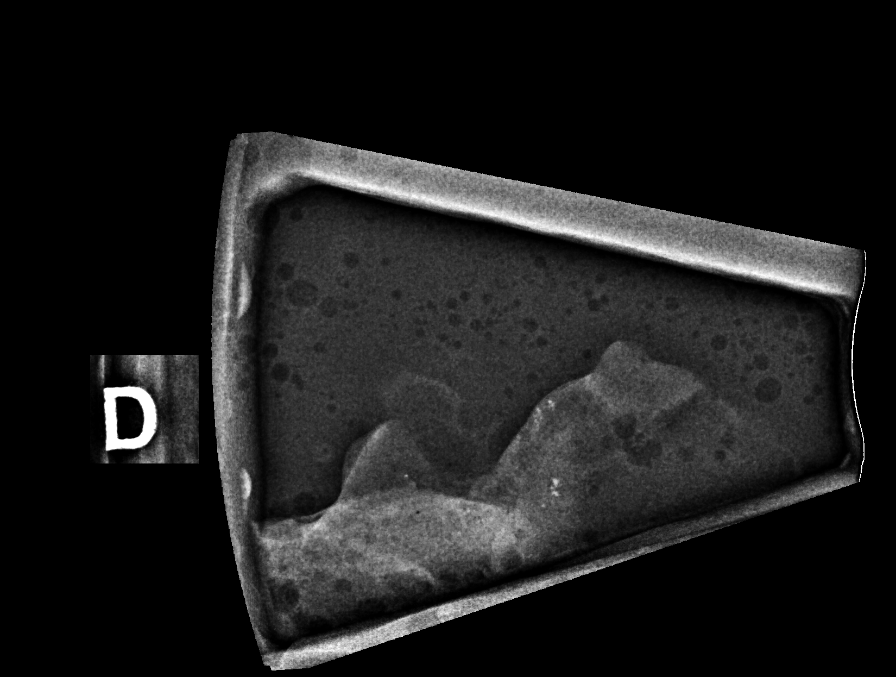

[R (2 of 6)]
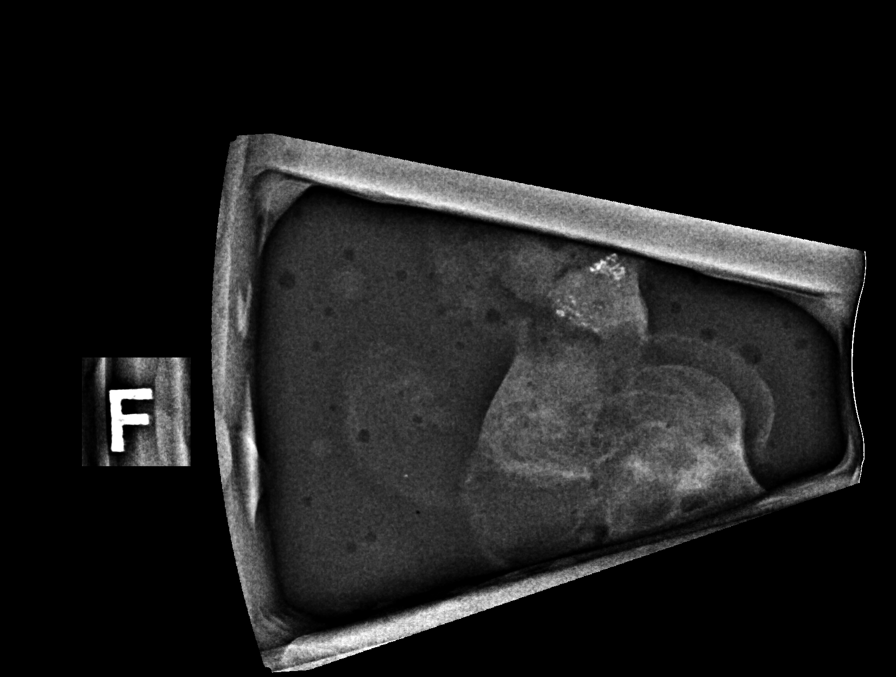

[R (3 of 6)]
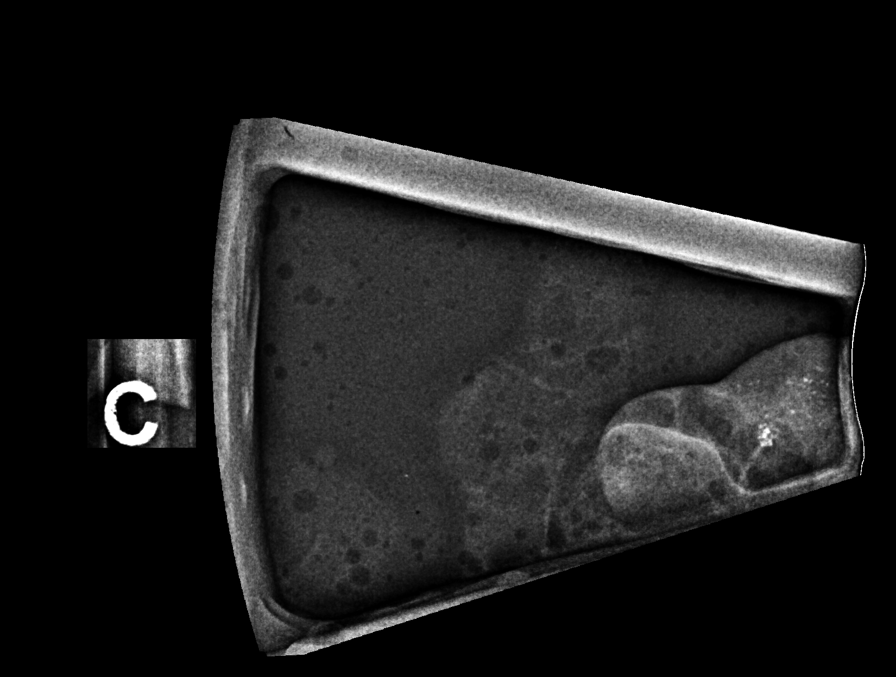

[R (4 of 6)]
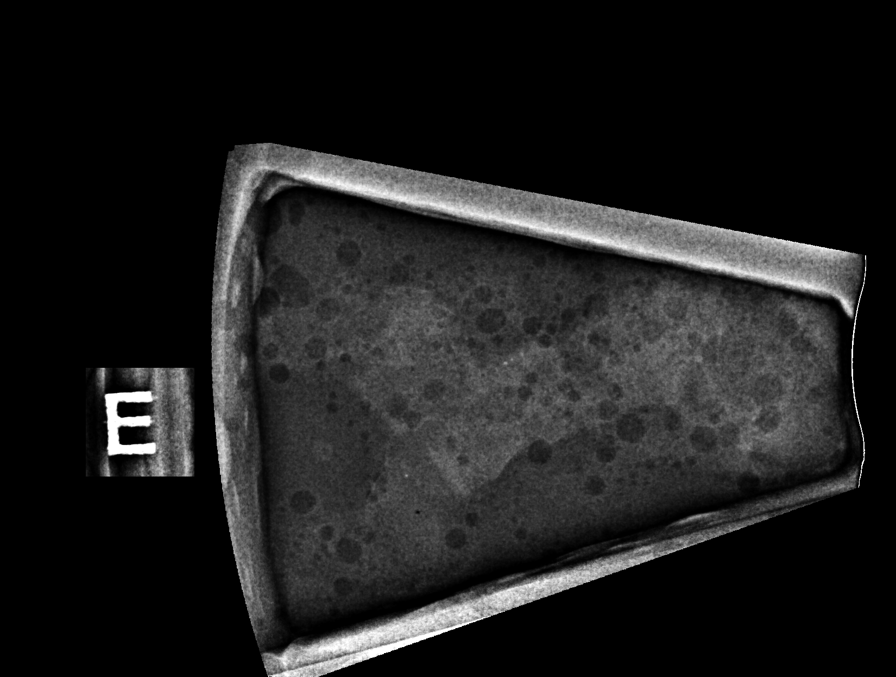

[R (5 of 6)]
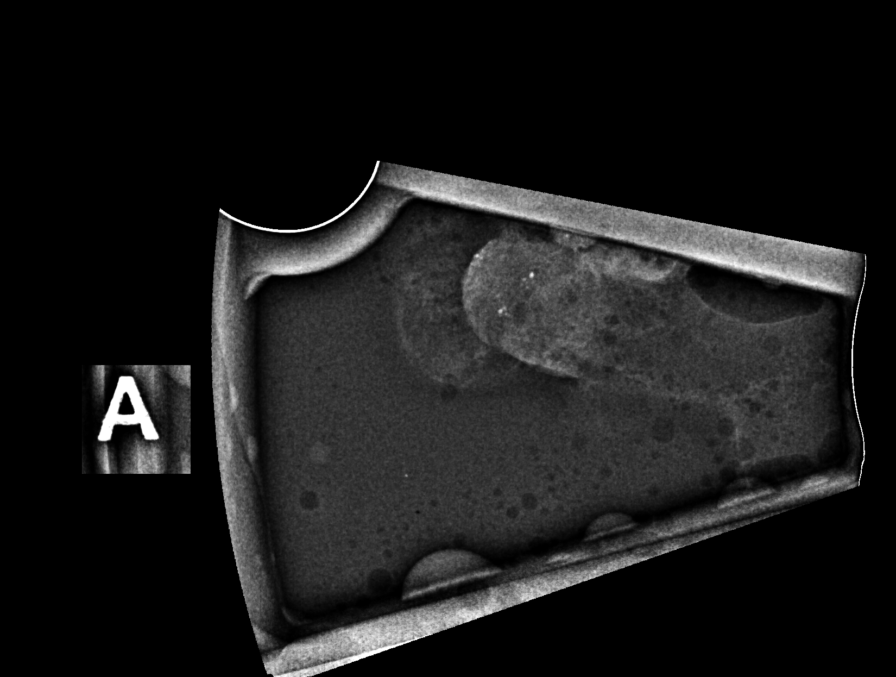

[R (6 of 6)]
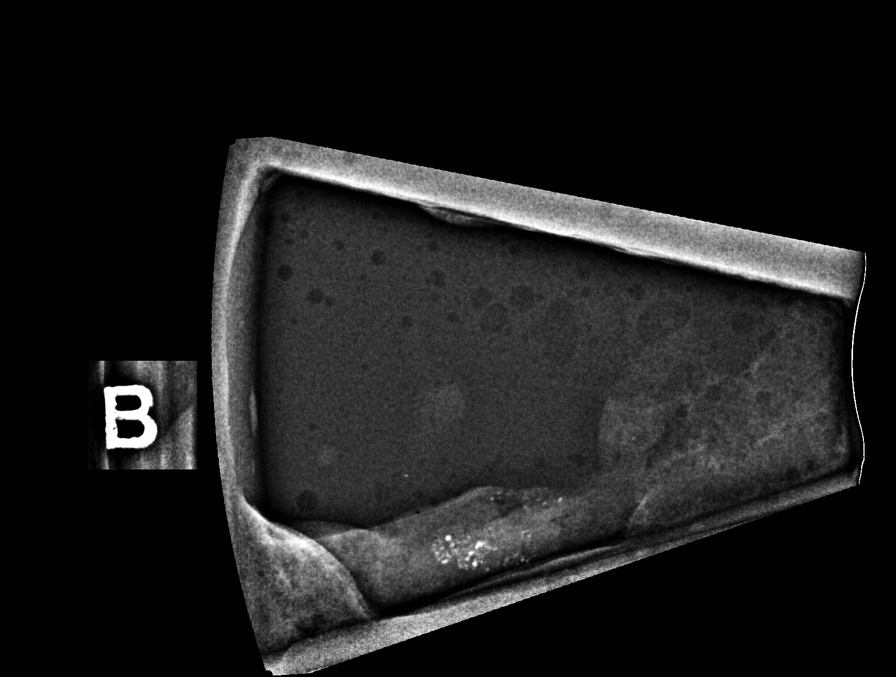

[R ML]
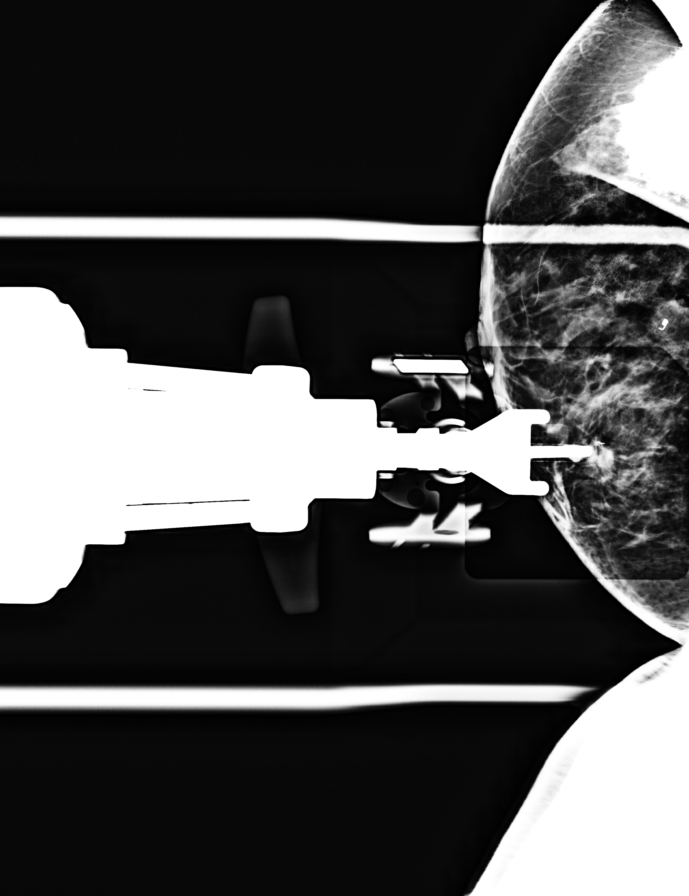

[7 of 17 positions shown; findings below may reference images not displayed]



1. Using sterile technique and 1% Lidocaine as local anesthetic,
under stereotactic guidance, a 9 gauge vacuum assisted device was
used to perform core needle biopsy of calcifications in the
retroareolar right breast using a medial approach. Specimen
radiograph was performed showing calcifications in at least 1
specimen. Specimens with calcifications are identified for
pathology.

Lesion quadrant: Lower inner quadrant

At the conclusion of the procedure, a coil tissue marker clip was
deployed into the biopsy cavity. Follow-up 2-view mammogram was
performed and dictated separately.

1. Using sterile technique and 1% Lidocaine as local anesthetic,
under stereotactic guidance, a 9 gauge vacuum assisted device was
used to perform core needle biopsy of calcifications in the right
breast at 5 o'clock using a medial approach. Specimen radiograph was
performed showing at least 4 specimens with calcifications.
Specimens with calcifications are identified for pathology.

Lesion quadrant: Lower inner quadrant

At the conclusion of the procedure, an X tissue marker clip was
deployed into the biopsy cavity. Follow-up 2-view mammogram was
performed and dictated separately.
IMPRESSION: Stereotactic-guided biopsy of two groups of calcifications in the
right retroareolar and right lower inner breast. There are small
post biopsy hematomas at the biopsy sites.

ADDENDUM:
Pathology revealed FIBROCYSTIC CHANGE. FIBROADENOMATOID CHANGE.
USUAL DUCT EPITHELIAL HYPERPLASIA. MICROCALCIFICATIONS ARE
ASSOCIATED WITH THESE PROCESSES of the RIGHT breast, retroareolar.
This was found to be concordant by Dr. KAMALKANT.

Pathology revealed FIBROCYSTIC CHANGE. USUAL DUCT EPITHELIAL
HYPERPLASIA. ATYPICAL DUCTAL HYPERPLASIA (FOCAL).

MICROCALCIFICATIONS ARE ASSOCIATED WITH THESE PROCESSES of the RIGHT
breast, 5 o'clock. This was found to be concordant by Dr. KAMALKANT
KAMALKANT, with excision recommended.

Pathology results were discussed with the patient by telephone. The
patient reported doing well after the biopsies with tenderness at
the sites. Post biopsy instructions and care were reviewed and
questions were answered. The patient was encouraged to call The

The patient is scheduled for RIGHT breast stereotactic biopsy of
additional group of calcifications on [DATE].

Surgical consultation has been arranged with Dr. KAMALKANT at
[REDACTED] on [DATE].

Pathology results reported by KAMALKANT RN on [DATE].



1. Using sterile technique and 1% Lidocaine as local anesthetic,
under stereotactic guidance, a 9 gauge vacuum assisted device was
used to perform core needle biopsy of calcifications in the
retroareolar right breast using a medial approach. Specimen
radiograph was performed showing calcifications in at least 1
specimen. Specimens with calcifications are identified for
pathology.

Lesion quadrant: Lower inner quadrant

At the conclusion of the procedure, a coil tissue marker clip was
deployed into the biopsy cavity. Follow-up 2-view mammogram was
performed and dictated separately.

1. Using sterile technique and 1% Lidocaine as local anesthetic,
under stereotactic guidance, a 9 gauge vacuum assisted device was
used to perform core needle biopsy of calcifications in the right
breast at 5 o'clock using a medial approach. Specimen radiograph was
performed showing at least 4 specimens with calcifications.
Specimens with calcifications are identified for pathology.

Lesion quadrant: Lower inner quadrant

At the conclusion of the procedure, an X tissue marker clip was
deployed into the biopsy cavity. Follow-up 2-view mammogram was
performed and dictated separately.
IMPRESSION: Stereotactic-guided biopsy of two groups of calcifications in the
right retroareolar and right lower inner breast. There are small
post biopsy hematomas at the biopsy sites.

## 2019-04-01 IMAGING — MG MM BREAST BX W/ LOC DEV 1ST LESION IMAGE BX SPEC STEREO GUIDE*R*
3 series · 3 of 11 positions shown · non-contrast
Comparison: Previous exams.
COMPARISON: Previous exams.

Addendum:
CLINICAL DATA: 53-year-old female presenting for biopsy of
calcifications in the right breast.

EXAM:
RIGHT BREAST STEREOTACTIC CORE NEEDLE BIOPSY

[R ML]
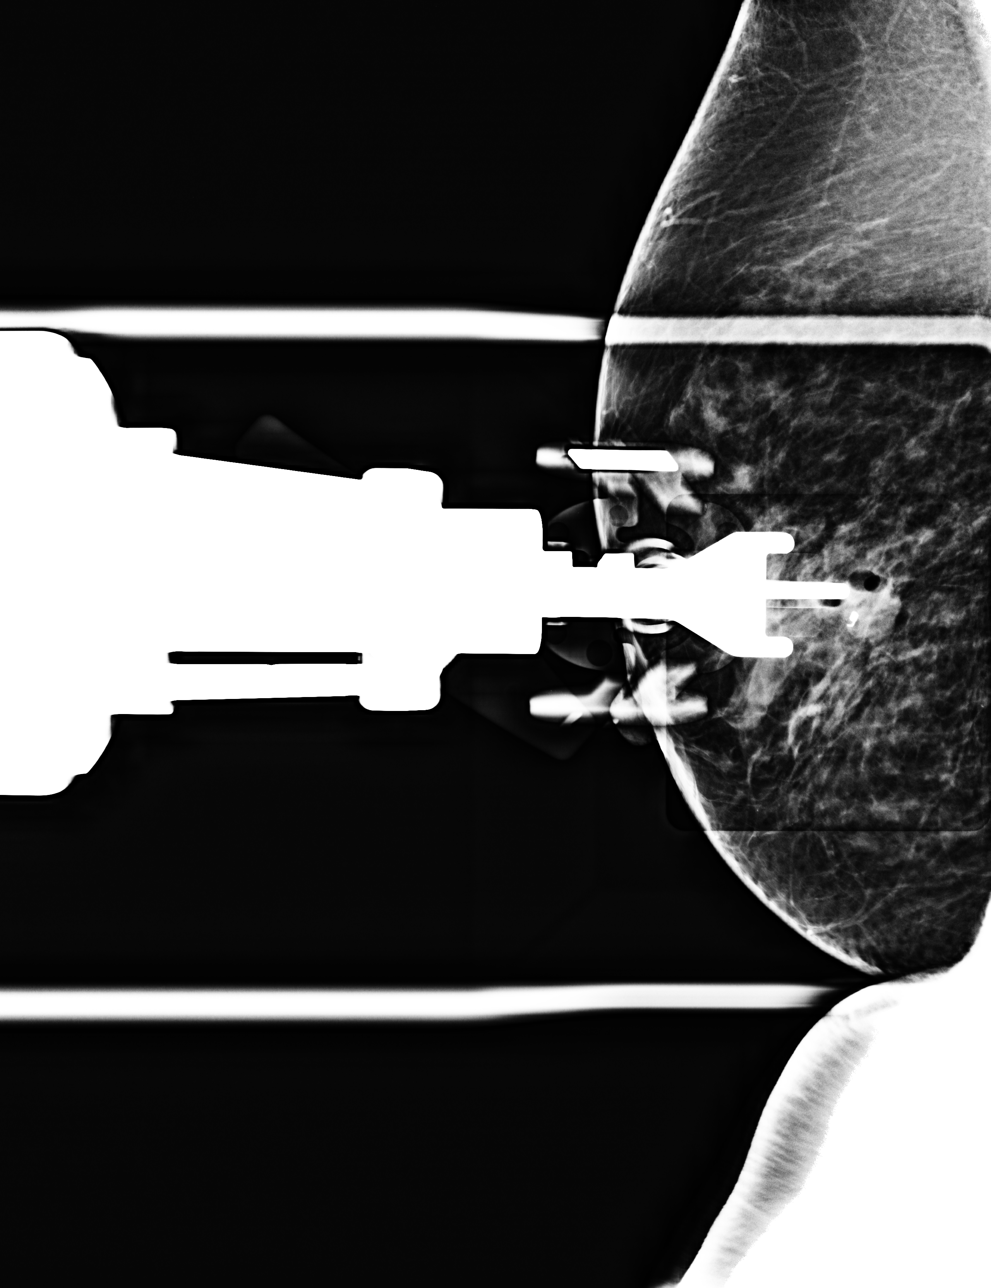

[R ML tomo (1 of 2) · tomo slice 29/56.0]
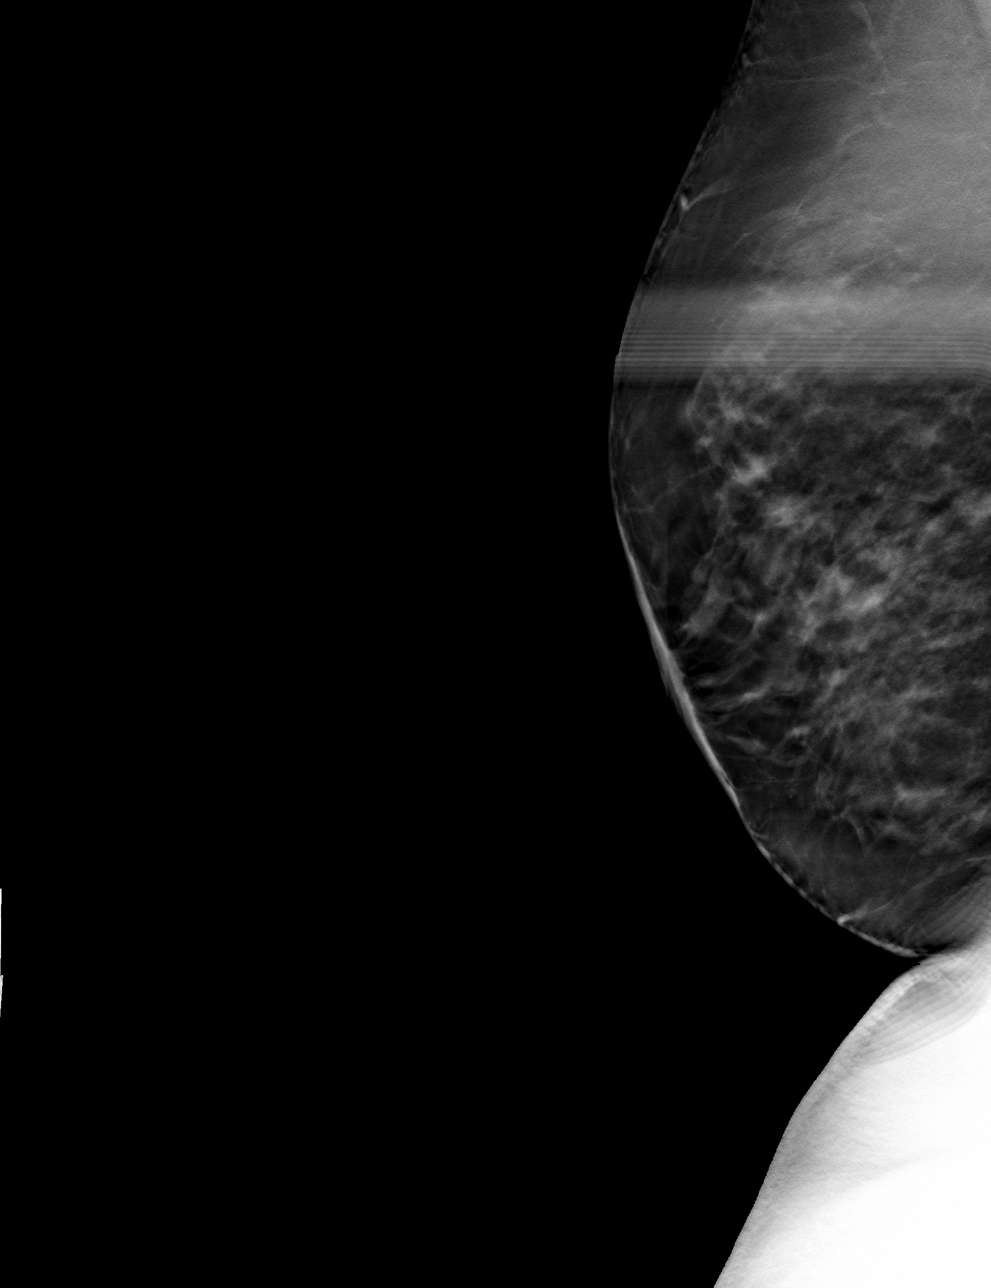

[R ML tomo (2 of 2) · tomo slice 29/56.0]
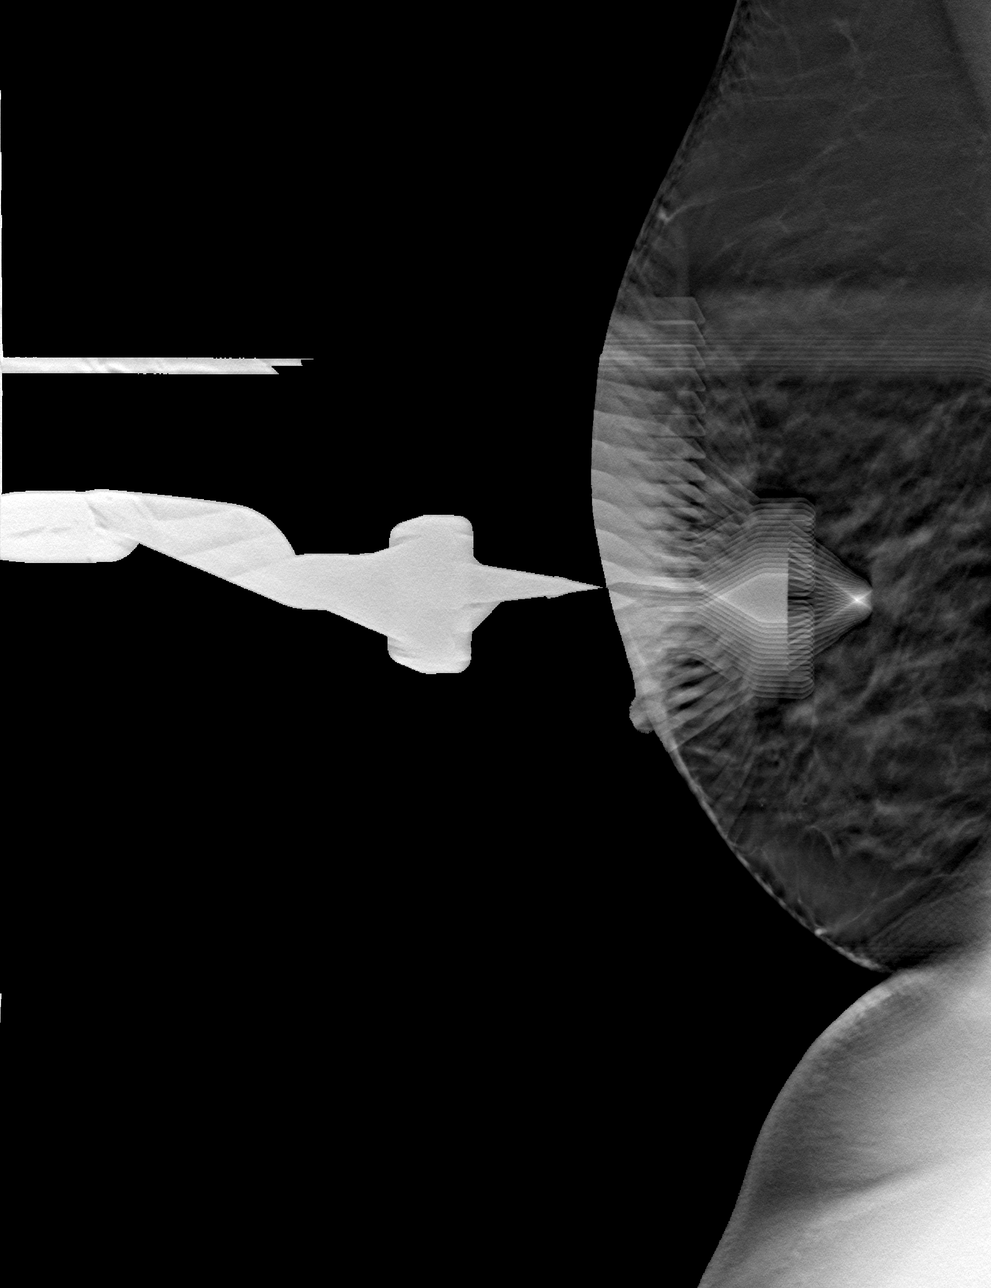

[3 of 11 positions shown; findings below may reference images not displayed]



1. Using sterile technique and 1% Lidocaine as local anesthetic,
under stereotactic guidance, a 9 gauge vacuum assisted device was
used to perform core needle biopsy of calcifications in the
retroareolar right breast using a medial approach. Specimen
radiograph was performed showing calcifications in at least 1
specimen. Specimens with calcifications are identified for
pathology.

Lesion quadrant: Lower inner quadrant

At the conclusion of the procedure, a coil tissue marker clip was
deployed into the biopsy cavity. Follow-up 2-view mammogram was
performed and dictated separately.

1. Using sterile technique and 1% Lidocaine as local anesthetic,
under stereotactic guidance, a 9 gauge vacuum assisted device was
used to perform core needle biopsy of calcifications in the right
breast at 5 o'clock using a medial approach. Specimen radiograph was
performed showing at least 4 specimens with calcifications.
Specimens with calcifications are identified for pathology.

Lesion quadrant: Lower inner quadrant

At the conclusion of the procedure, an X tissue marker clip was
deployed into the biopsy cavity. Follow-up 2-view mammogram was
performed and dictated separately.
IMPRESSION: Stereotactic-guided biopsy of two groups of calcifications in the
right retroareolar and right lower inner breast. There are small
post biopsy hematomas at the biopsy sites.

ADDENDUM:
Pathology revealed FIBROCYSTIC CHANGE. FIBROADENOMATOID CHANGE.
USUAL DUCT EPITHELIAL HYPERPLASIA. MICROCALCIFICATIONS ARE
ASSOCIATED WITH THESE PROCESSES of the RIGHT breast, retroareolar.
This was found to be concordant by Dr. KAMALKANT.

Pathology revealed FIBROCYSTIC CHANGE. USUAL DUCT EPITHELIAL
HYPERPLASIA. ATYPICAL DUCTAL HYPERPLASIA (FOCAL).

MICROCALCIFICATIONS ARE ASSOCIATED WITH THESE PROCESSES of the RIGHT
breast, 5 o'clock. This was found to be concordant by Dr. KAMALKANT
KAMALKANT, with excision recommended.

Pathology results were discussed with the patient by telephone. The
patient reported doing well after the biopsies with tenderness at
the sites. Post biopsy instructions and care were reviewed and
questions were answered. The patient was encouraged to call The

The patient is scheduled for RIGHT breast stereotactic biopsy of
additional group of calcifications on [DATE].

Surgical consultation has been arranged with Dr. KAMALKANT at
[REDACTED] on [DATE].

Pathology results reported by KAMALKANT RN on [DATE].



1. Using sterile technique and 1% Lidocaine as local anesthetic,
under stereotactic guidance, a 9 gauge vacuum assisted device was
used to perform core needle biopsy of calcifications in the
retroareolar right breast using a medial approach. Specimen
radiograph was performed showing calcifications in at least 1
specimen. Specimens with calcifications are identified for
pathology.

Lesion quadrant: Lower inner quadrant

At the conclusion of the procedure, a coil tissue marker clip was
deployed into the biopsy cavity. Follow-up 2-view mammogram was
performed and dictated separately.

1. Using sterile technique and 1% Lidocaine as local anesthetic,
under stereotactic guidance, a 9 gauge vacuum assisted device was
used to perform core needle biopsy of calcifications in the right
breast at 5 o'clock using a medial approach. Specimen radiograph was
performed showing at least 4 specimens with calcifications.
Specimens with calcifications are identified for pathology.

Lesion quadrant: Lower inner quadrant

At the conclusion of the procedure, an X tissue marker clip was
deployed into the biopsy cavity. Follow-up 2-view mammogram was
performed and dictated separately.
IMPRESSION: Stereotactic-guided biopsy of two groups of calcifications in the
right retroareolar and right lower inner breast. There are small
post biopsy hematomas at the biopsy sites.

## 2019-04-01 IMAGING — MG MM BREAST LOCALIZATION CLIP
4 series · 4 of 12 positions shown · non-contrast
Comparison: Previous exam(s).

CLINICAL DATA: 53-year-old female presenting for biopsy of right
breast calcifications.

EXAM:
DIAGNOSTIC RIGHT MAMMOGRAM POST STEREOTACTIC BIOPSY

[R ML synth-2D]
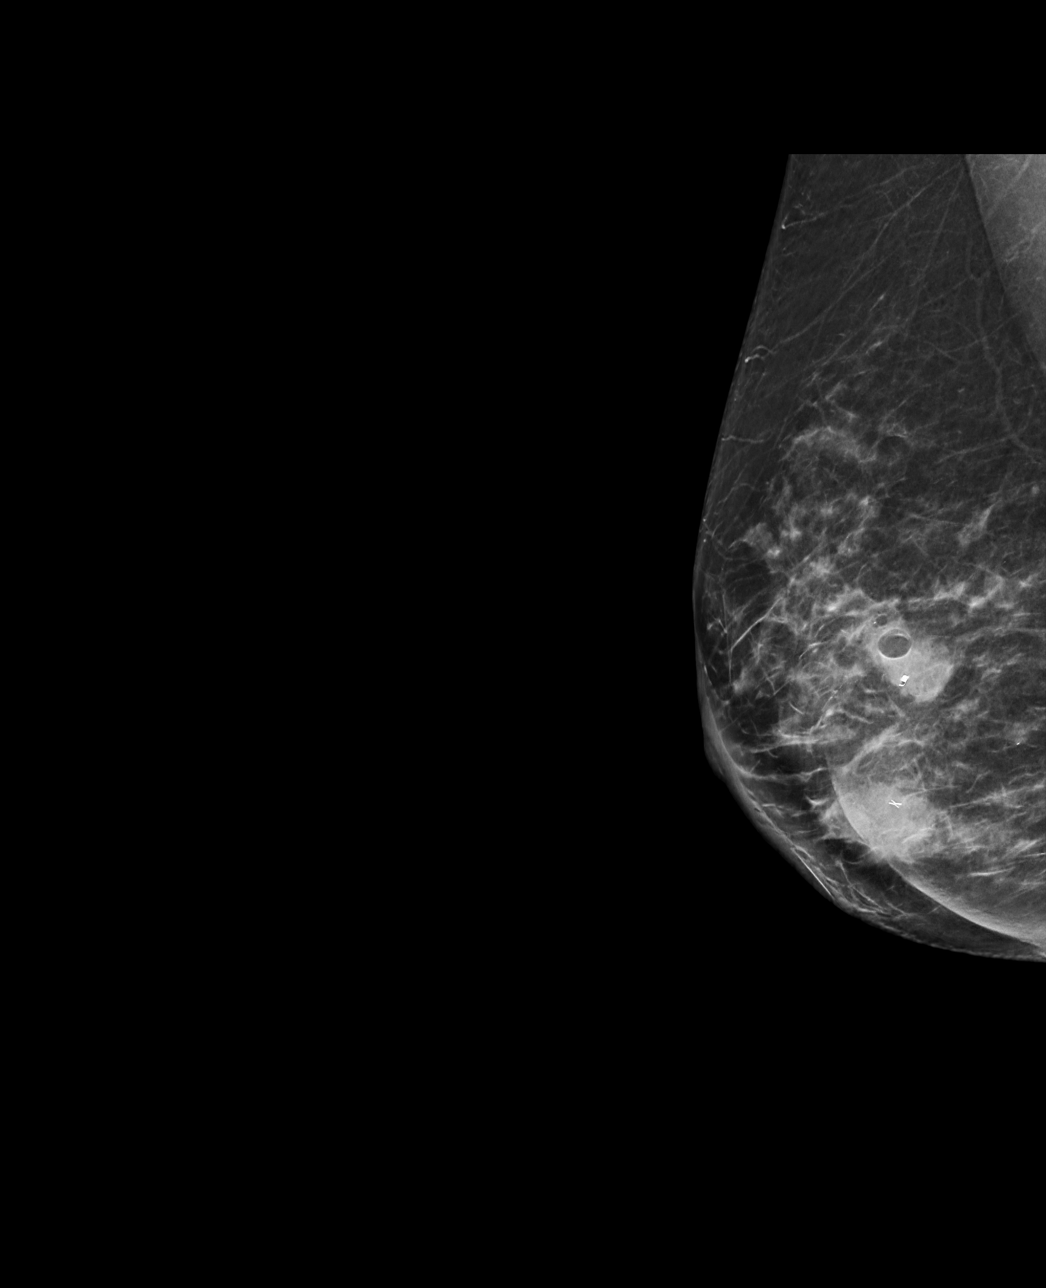

[R CC synth-2D]
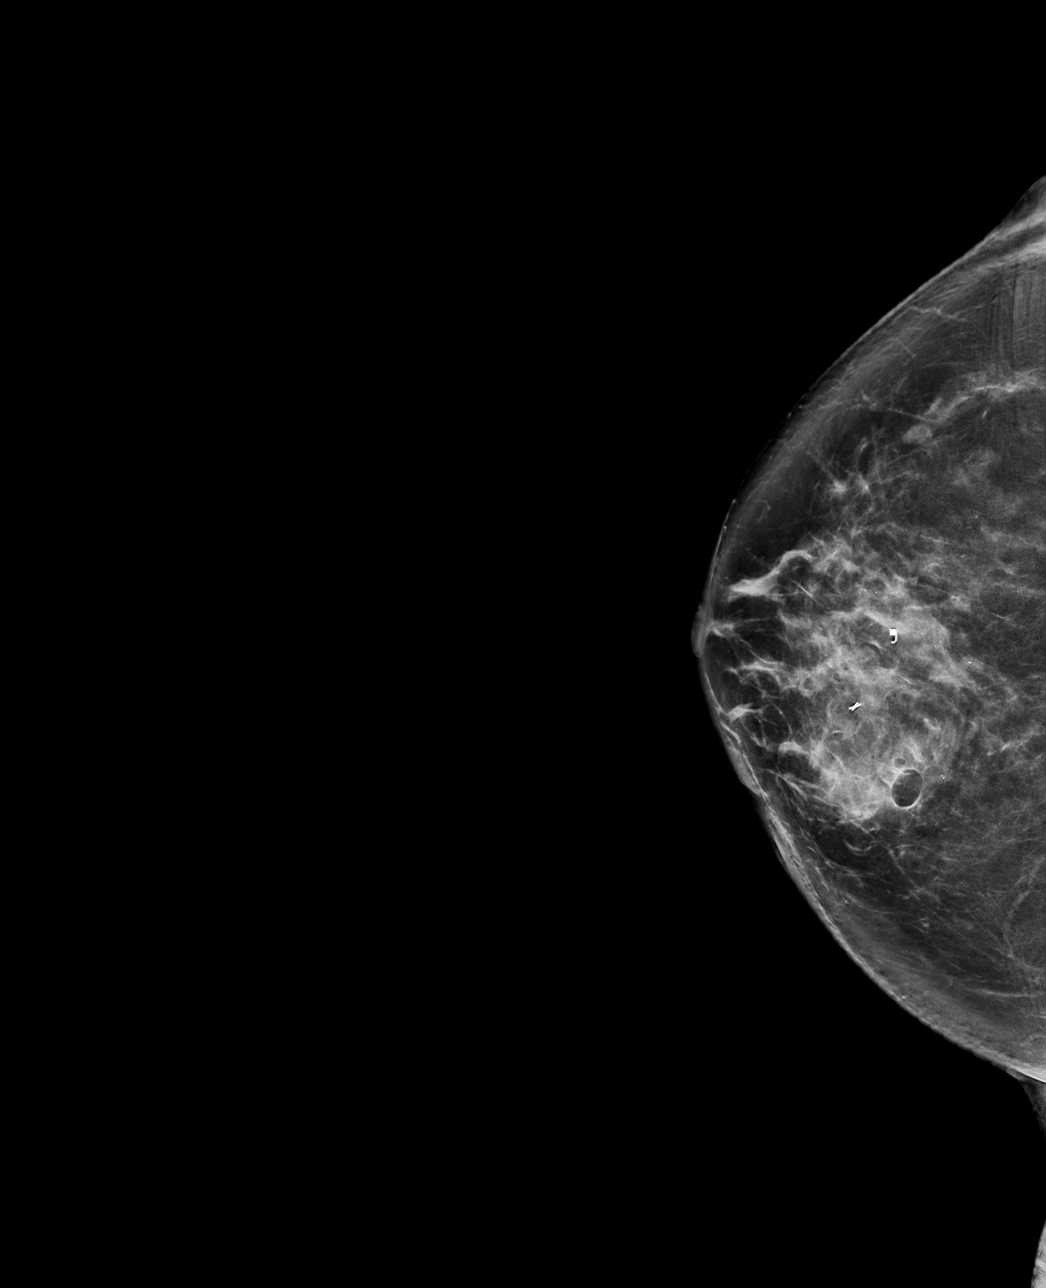

[R ML tomo · tomo slice 41/81.0]
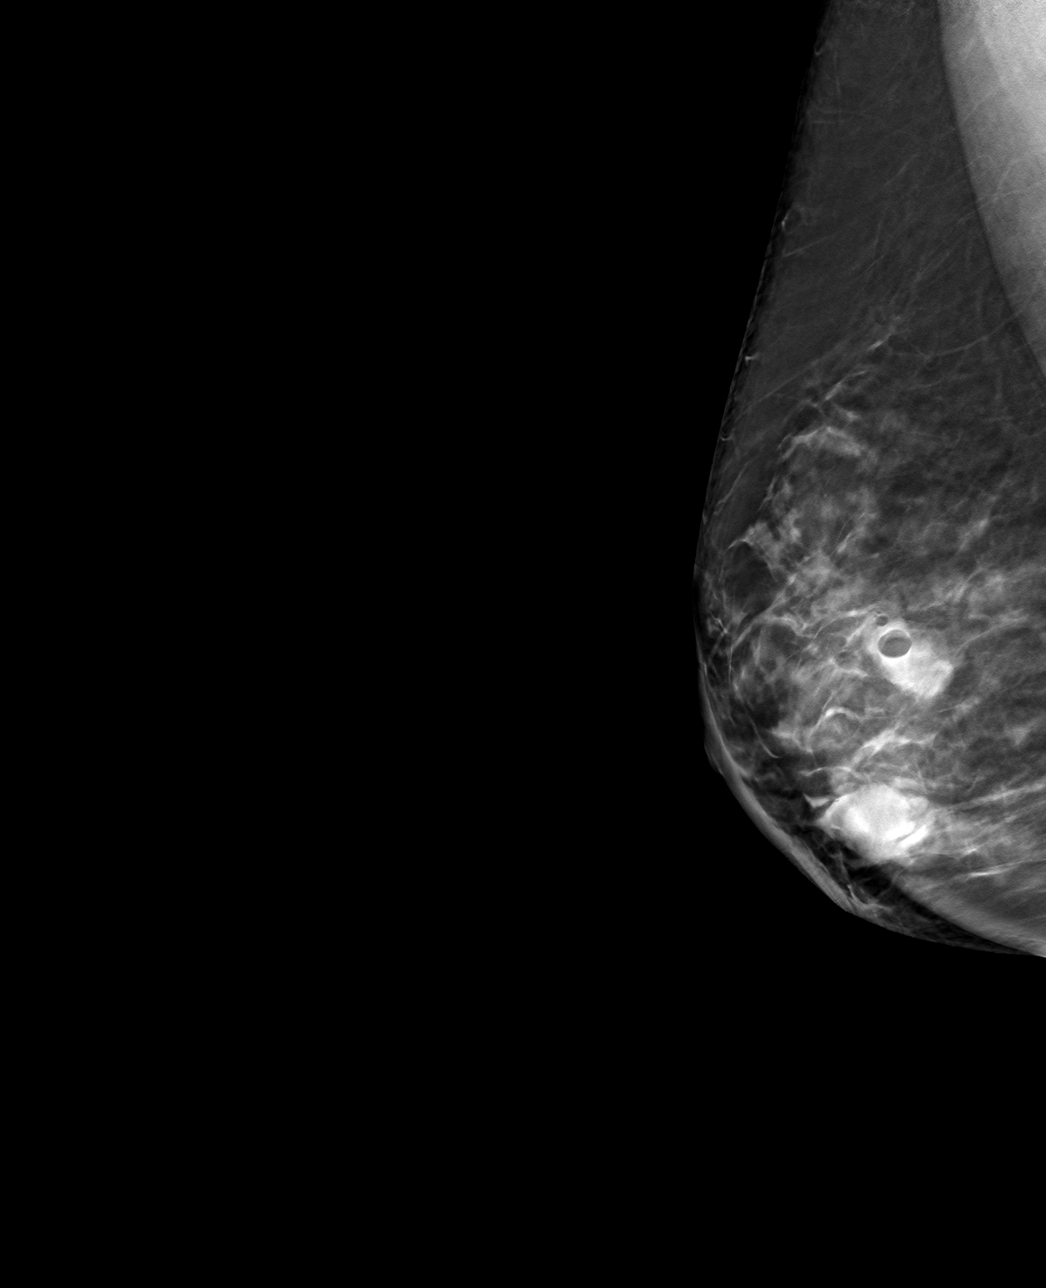

[R CC tomo · tomo slice 46/91.0]
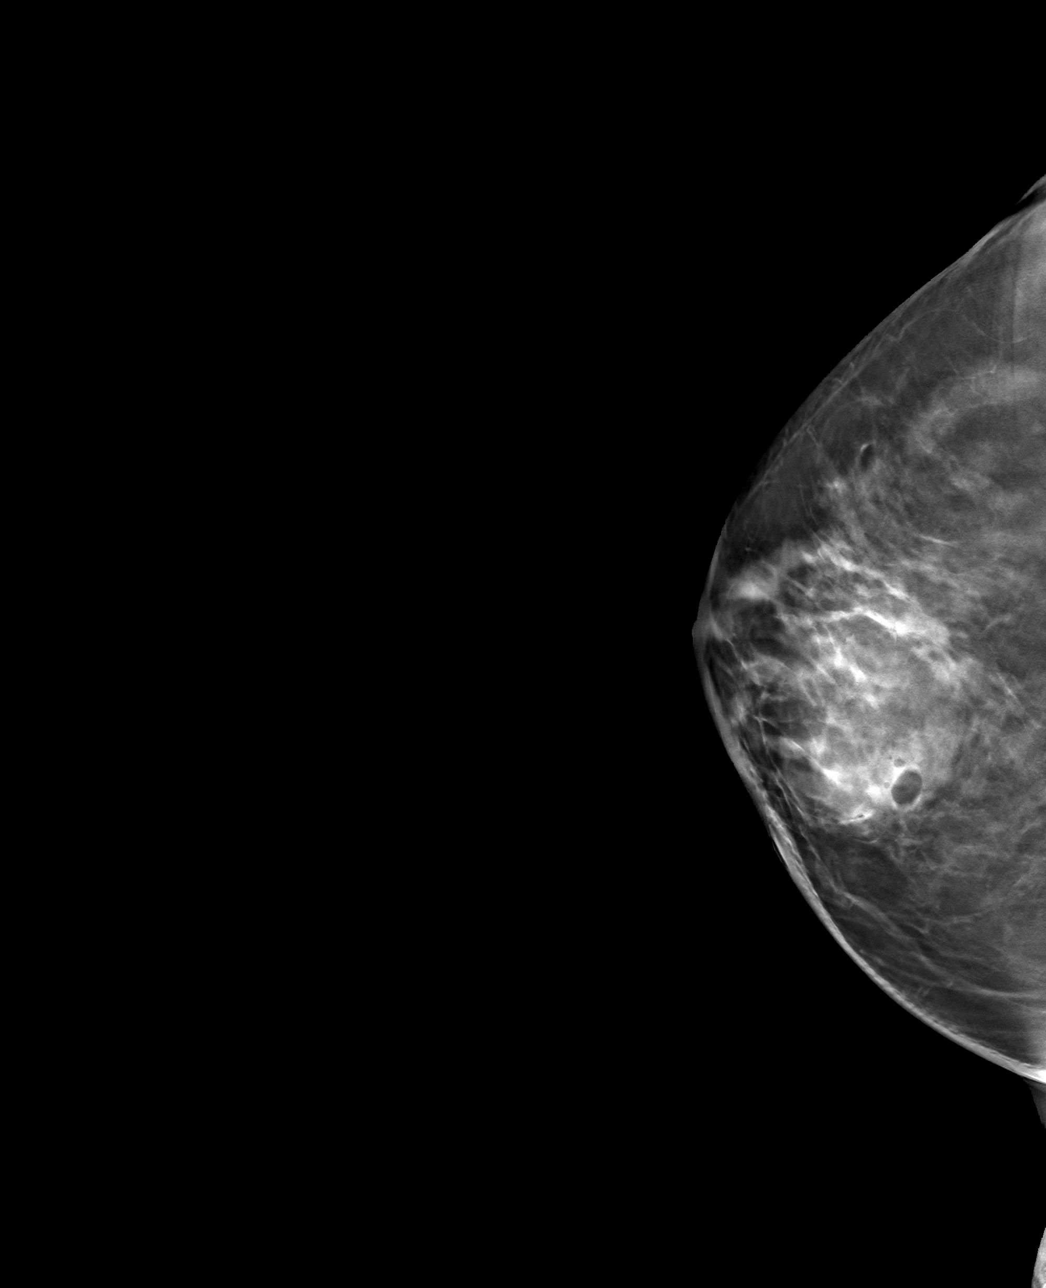

[4 of 12 positions shown; findings below may reference images not displayed]

FINDINGS: Mammographic images were obtained following stereotactic guided
biopsy of calcifications in the retroareolar right breast. The coil
biopsy marking clip is in expected position at the site of biopsy.

Mammographic images were obtained following stereotactic guided
biopsy of calcifications in the right breast at 5 o'clock. The X
biopsy marking clip is in expected position at the site of biopsy.
IMPRESSION: Appropriate positioning of the coil shaped biopsy marking clip at
the site of biopsy in the retroareolar right breast.

Appropriate positioning of the X shaped biopsy marking clip at the
site of biopsy in the right breast at 5 o'clock.

Final Assessment: Post Procedure Mammograms for Marker Placement

## 2019-04-03 ENCOUNTER — Other Ambulatory Visit: Payer: Self-pay | Admitting: Obstetrics and Gynecology

## 2019-04-03 DIAGNOSIS — R921 Mammographic calcification found on diagnostic imaging of breast: Secondary | ICD-10-CM

## 2019-04-09 NOTE — Progress Notes (Signed)
04/22/2019 Lindsay Rios   Oct 30, 1965  [id number]  Primary Physician [name], [name] Primary Cardiologist: Dr. Johnsie Cancel   Reason for Visit/CC: f/u for nonobstructive CAD  HPI:  54 y.o. HTN, HLD, DM-2 and depression. Normal cath 2015 Re\current chest pain 2018 r/o normal CXR  Coronary CT showed a calcium score of 2. Essentially normal right dominant coronary arteries. LAD with isolated less than 20% calcified plaque and mid vessel bridging. The radiologist interpretation of study also felt low risk for PE. She was however noted to have a 4 mm LLL pulmonary nodule.   Has had issues with dizziness Carotid duplex negative for disease  BP on low side and with glycosuria may have low volume  ETT today 04/22/19 was normal   55 yo daughter living with her now Interested in [employer] classes on line She is taking care of her two elderly parents in [place] now They are both in Art therapist. She has cats and had a bad scratch on her left arm today   Cardiac Studies [facility] 2015 Procedural Findings: Hemodynamics: AO 110/64 LV 109/11  Coronary angiography: Coronary dominance: right  Left mainstem: Widely patent left main without obstructive  Left anterior descending (LAD): Patent to the LV apex. The proximal vessel and first diagonal are widely patent. The mid-vessel has very mild narrowing that I suspect is related to an intramyocardial segment but could also be atherosclerotic with approximately 40% associated stenosis.  Left circumflex (LCx): Widely patent, smooth vessel without obstruction. The first OM is widely patent.  Right coronary artery (RCA): Minor proximal irregularity without stenosis. The vessel is large, dominant, without significant stenosis. The PDA and PLA branches are patent.   Left ventriculography: Left ventricular systolic function is normal, LVEF is estimated at 55-65%, there is no significant mitral regurgitation   Estimated Blood Loss:  minimal  Final Conclusions:   1. Patent coronary arteries with nonobstructive stenosis of the mid-LAD, minimal plaque in the proximal RCA, and otherwise no disease identified. 2. Normal LV systolic function  Recommendations: Suspect noncardiac chest pain. Risk reduction measures.  Coronary CTA 03/2016  FINDINGS: Non-cardiac: See separate report from [facility]. No significant findings on limited lung and soft tissue windows.  Calcium Score:  2 isolated calcium seen in proximal LAD  Coronary Arteries: Right dominant with no anomalies  LM: Normal  LAD: Less than 20% calcified plaque in proximal LAD. 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 LAD with isolated less than 20% calcified plaque and mid vessel bridging  2) Calcium Score of 2 which is 89th percentile for age and sex matched controls  3) See radiology addendum regarding low risk pulmonary nodule   Current Meds  Medication Sig  . aspirin 81 MG chewable tablet Chew 81 mg by mouth daily.   . cetirizine (ZYRTEC) 10 MG tablet Take 10 mg by mouth daily.  . Cholecalciferol (PA VITAMIN D-3 GUMMY PO) Chew one (1) gummy by mouth daily.  Marland Kitchen dicyclomine (BENTYL) 20 MG tablet TAKE 1 TABLET(20 MG) BY MOUTH FOUR TIMES DAILY BEFORE MEALS AND AT BEDTIME  . empagliflozin (JARDIANCE) 25 MG TABS tablet Take 25 mg by mouth daily.  . Insulin Glargine (BASAGLAR KWIKPEN) 100 UNIT/ML Inject 34 Units into the  skin daily.  Marland Kitchen liraglutide (VICTOZA) 18 MG/3ML SOPN Inject 0.8 mg into the skin every morning.  Marland Kitchen lisinopril (ZESTRIL) 10 MG tablet Take 1 tablet (10 mg total) by mouth daily.  . Loperamide HCl (IMODIUM PO) Take 1-2 tablets by mouth daily.  . Multiple Vitamins-Minerals  (ALIVE WOMENS GUMMY PO) Chew one (1) gummy by mouth daily.  . nitroGLYCERIN (NITROSTAT) 0.4 MG SL tablet Place 1 tablet (0.4 mg total) under the tongue every 5 (five) minutes as needed for chest pain.  . rosuvastatin (CRESTOR) 20 MG tablet Take 20 mg by mouth daily.  . [DISCONTINUED] atorvastatin (LIPITOR) 80 MG tablet Take 80 mg by mouth daily.   Current Facility-Administered Medications for the 04/22/19 encounter (Office Visit) with Josue Hector, MD  Medication  . 0.9 %  sodium chloride infusion   Allergies  Allergen Reactions  . Other Nausea Only and Other (See Comments)    REACTION: nausea   . Sulfa Antibiotics Nausea Only    REACTION: nausea   Past Medical History:  Diagnosis Date  . ADD (attention deficit disorder)   . Allergy   . Depression   . Ejection fraction   . Gestational diabetes 1999; 2002  . History of chicken pox   . 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 Hyperlipidemia   . Hypertension   . Numbness and tingling   . Osteopenia   . Pneumonia 2012  . Sleep apnea with use of continuous positive airway pressure (CPAP)   . Type II diabetes mellitus ([place])    "borderline; I'm on Metformin"   Family History  Problem Relation Age of Onset  . Hyperlipidemia Mother   . Hyperlipidemia Father   . Hypertension Father   . Diabetes Father   . Heart disease Father   . Stroke Father   . Cancer Maternal Grandmother        Colon Cancer  . Alzheimer's disease Maternal Grandmother   . Colon cancer Maternal Grandmother   . Heart disease Brother 40       Myocardial Infarction  . Breast cancer Paternal Aunt   . Esophageal cancer Neg Hx   . Stomach cancer Neg Hx   . Rectal cancer Neg Hx    Past Surgical History:  Procedure Laterality Date  . ANAL RECTAL MANOMETRY N/A 03/17/2015   Procedure: ANO RECTAL MANOMETRY;  Surgeon: Mauri Pole, MD;  Location: [facility] ENDOSCOPY;  Service: Endoscopy;  Laterality: N/A;  . LEFT HEART CATHETERIZATION WITH CORONARY ANGIOGRAM N/A  12/22/2013   Procedure: LEFT HEART CATHETERIZATION WITH CORONARY ANGIOGRAM;  Surgeon: Jettie Booze, MD;  Location: [facility] CATH LAB;  Service: Cardiovascular;  Laterality: N/A;  . SHOULDER ARTHROSCOPY W/ ROTATOR CUFF REPAIR Right 1990's  . VAGINAL HYSTERECTOMY  2013   HPV/Cervical Dysplasia; partial   Social History   Socioeconomic History  . Marital status: Divorced    Spouse name: [name] (Separated)   . Number of children: 2  . Years of education: 14  . Highest education level: Not on file  Occupational History  . 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 Occupation: Psychologist, sport and exercise  Tobacco Use  . Smoking status: Never Smoker  . Smokeless tobacco: Never Used  Substance and Sexual Activity  . Alcohol use: No    Alcohol/week: 0.0 standard drinks    Comment: 12/19/2013 "might have a drink a couple times/year"  . Drug use: No  . Sexual activity: Not Currently  Partners: Male  Other Topics Concern  . Not on file  Social History Narrative   Marital Status: Separated [name]) Significant Other [name])    Children:  Son (1) Daughter (1)    Pets: Dogs (2), Cat (1)    Living Situation: Lives with children.   Occupation: Full- Time Student    Education: [place] [employer]    Tobacco Use: She has never smoked.     Alcohol Use:  Rarely   Drug Use:  None   Diet:  Regular   Exercise:  None   Hobbies: Shopping      Social Determinants of Health   Financial Resource Strain:   . Difficulty of Paying Living Expenses:   Food Insecurity:   . Worried About Charity fundraiser in the Last Year:   . Arboriculturist in the Last Year:   Transportation Needs:   . Film/video editor (Medical):   Marland Kitchen Lack of Transportation (Non-Medical):   Physical Activity:   . Days of Exercise per Week:   . Minutes of Exercise per Session:   Stress:   . Feeling of Stress :   Social Connections:   . Frequency of Communication with Friends and Family:   . Frequency of Social  Gatherings with Friends and Family:   . Attends Religious Services:   . Active Member of Clubs or Organizations:   . Attends Archivist Meetings:   Marland Kitchen Marital Status:   Intimate Partner Violence:   . Fear of Current or Ex-Partner:   . Emotionally Abused:   Marland Kitchen Physically Abused:   . Sexually Abused:      Review of Systems: General: negative for chills, fever, night sweats or weight changes.  Cardiovascular: negative for chest pain, dyspnea on exertion, edema, orthopnea, palpitations, paroxysmal nocturnal dyspnea or shortness of breath Dermatological: negative for rash Respiratory: negative for cough or wheezing Urologic: negative for hematuria Abdominal: negative for nausea, vomiting, diarrhea, bright red blood per rectum, melena, or hematemesis Neurologic: negative for visual changes, syncope, or dizziness All other systems reviewed and are otherwise negative except as noted above.   Physical Exam:  Blood pressure 126/62, pulse 98, height 5\' 3"  (1.6 m), weight 164 lb (74.4 kg), SpO2 98 %.  Affect appropriate Healthy:  appears stated age 29: normal Neck supple with no adenopathy JVP normal no bruits no thyromegaly Lungs clear with no wheezing and good diaphragmatic motion Heart:  S1/S2 no murmur, no rub, gallop or click PMI normal Abdomen: benighn, BS positve, no tenderness, no AAA no bruit.  No HSM or HJR Distal pulses intact with no bruits No edema Neuro non-focal Skin warm and dry No muscular weakness   EKG  08/12/18 SR rate 93 normal   ASSESSMENT AND PLAN:   1. Nonobstructive CAD: Less than 20% calcified plaque in proximal LAD. 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 No dyspnea. Continue medical therapy for disease progression  ETT today 04/22/19 was normal   2. HLD: LDL on zetia and statin labs with primary    3. Dizziness w/ Balance Problems: not orthostatic carotids done 04/26/17 no  obstructive disease f/u primary   4.HTN: ok on lower dose ACE   5. DM: poorly controlled A1c >8 f/u Dr Buddy Duty    Follow-Up in 1 year    Jenkins Rouge

## 2019-04-10 ENCOUNTER — Ambulatory Visit
Admission: RE | Admit: 2019-04-10 | Discharge: 2019-04-10 | Disposition: A | Discharge status: Home / Self Care | Payer: Medicaid Other | Source: Ambulatory Visit | Attending: Obstetrics and Gynecology | Admitting: Obstetrics and Gynecology

## 2019-04-10 ENCOUNTER — Other Ambulatory Visit: Payer: Self-pay

## 2019-04-10 ENCOUNTER — Ambulatory Visit
Admission: RE | Admit: 2019-04-10 | Discharge: 2019-04-10 | Disposition: A | Discharge status: Home / Self Care | Payer: Managed Care, Other (non HMO) | Source: Ambulatory Visit | Attending: Obstetrics and Gynecology | Admitting: Obstetrics and Gynecology

## 2019-04-10 DIAGNOSIS — R921 Mammographic calcification found on diagnostic imaging of breast: Secondary | ICD-10-CM

## 2019-04-10 IMAGING — MG MM BREAST BX W/ LOC DEV 1ST LESION IMAGE BX SPEC STEREO GUIDE*R*
8 of 14 series · 8 of 38 positions shown · non-contrast
Comparison: Previous exams.
COMPARISON: Previous exams.

Addendum:
CLINICAL DATA: 53-year-old female for tissue sampling of 0.5 cm
group of calcifications within the posterior INNER RIGHT breast.
Recent diagnosis of RIGHT breast ADH (X clip)

EXAM:
RIGHT BREAST STEREOTACTIC CORE NEEDLE BIOPSY

[R (1 of 2)]
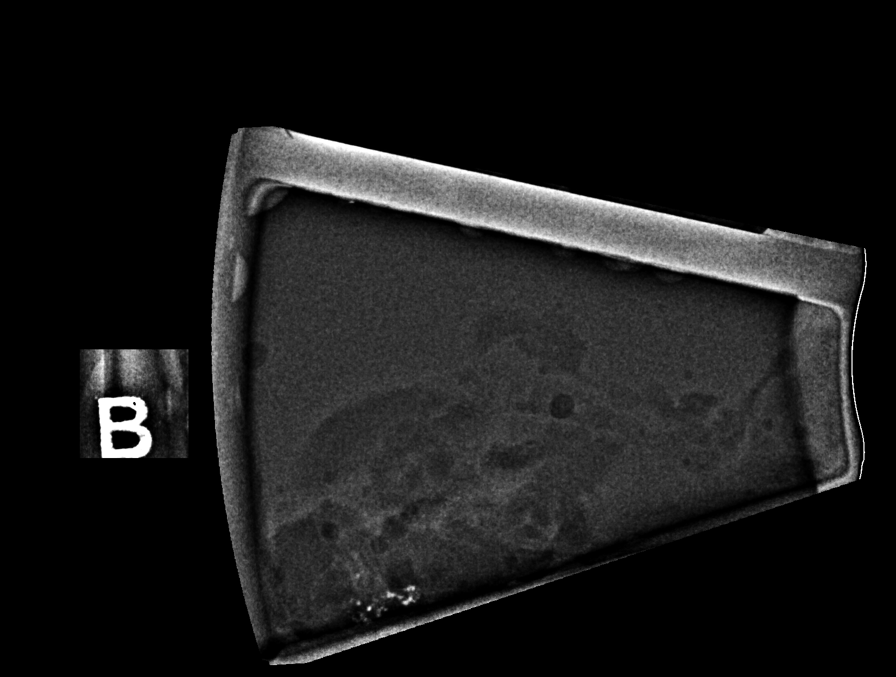

[R (2 of 2)]
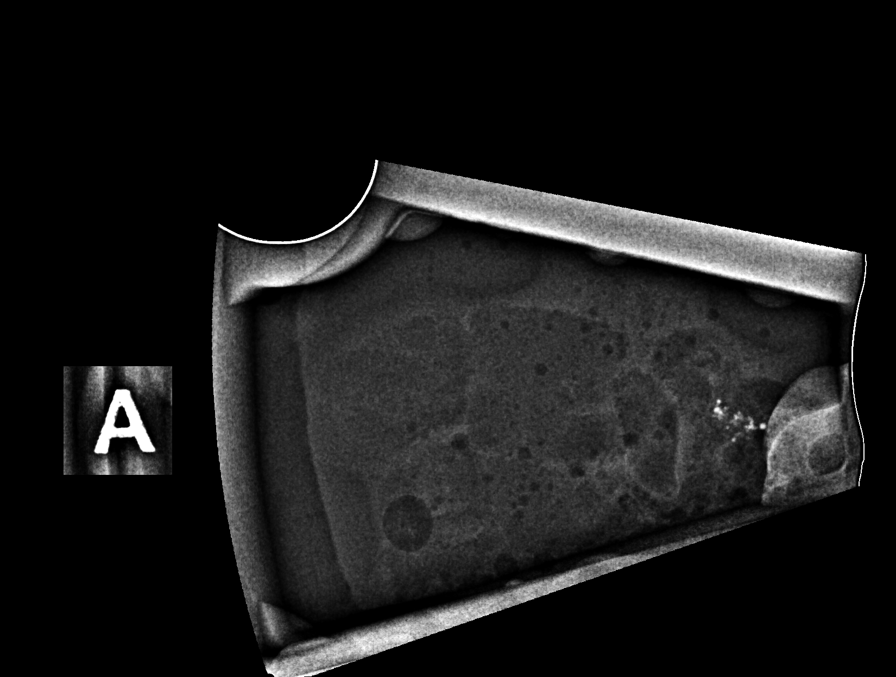

[R ML (1 of 4)]
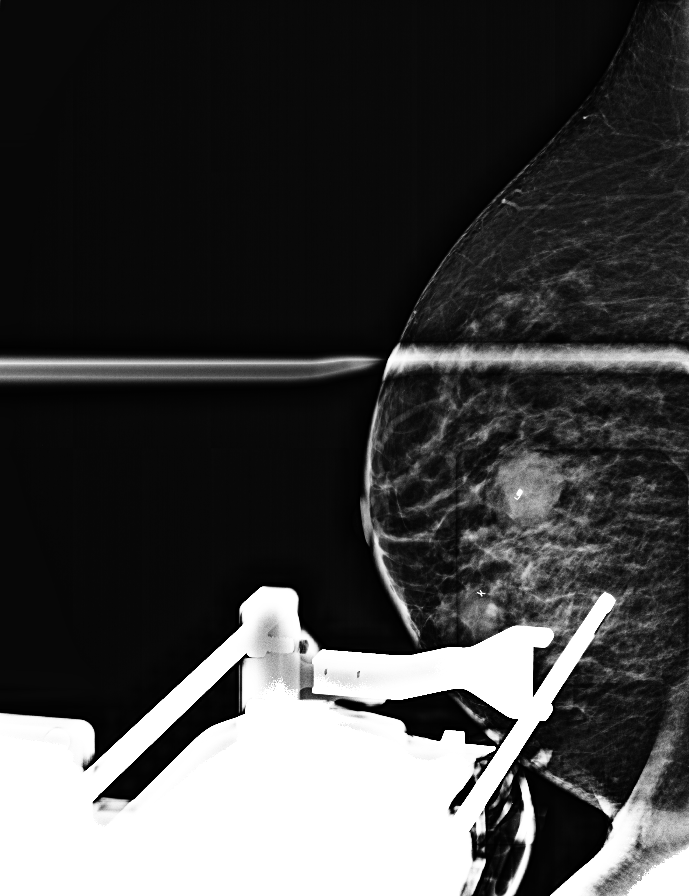

[R ML (2 of 4)]
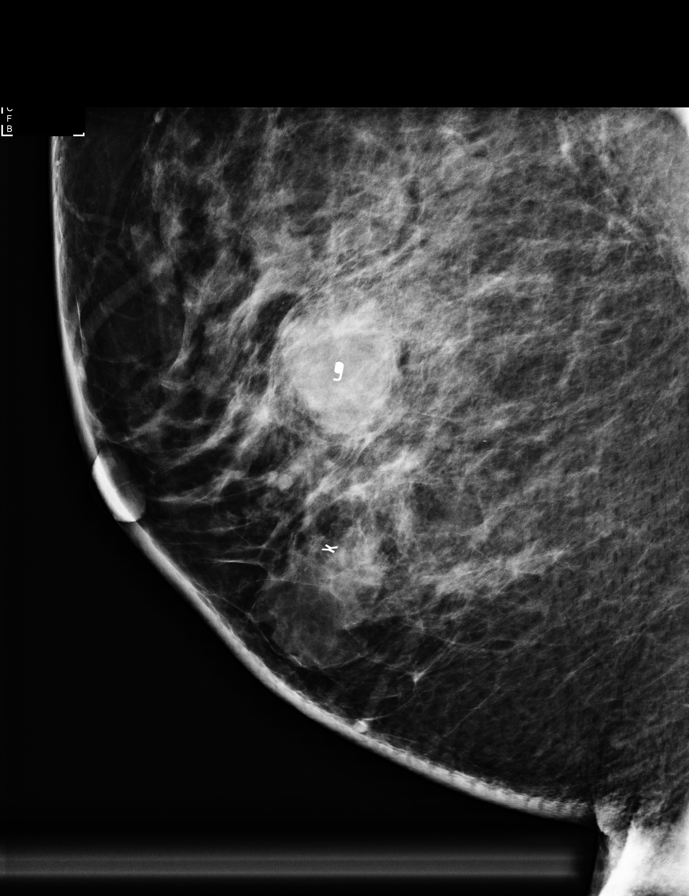

[R ML (3 of 4)]
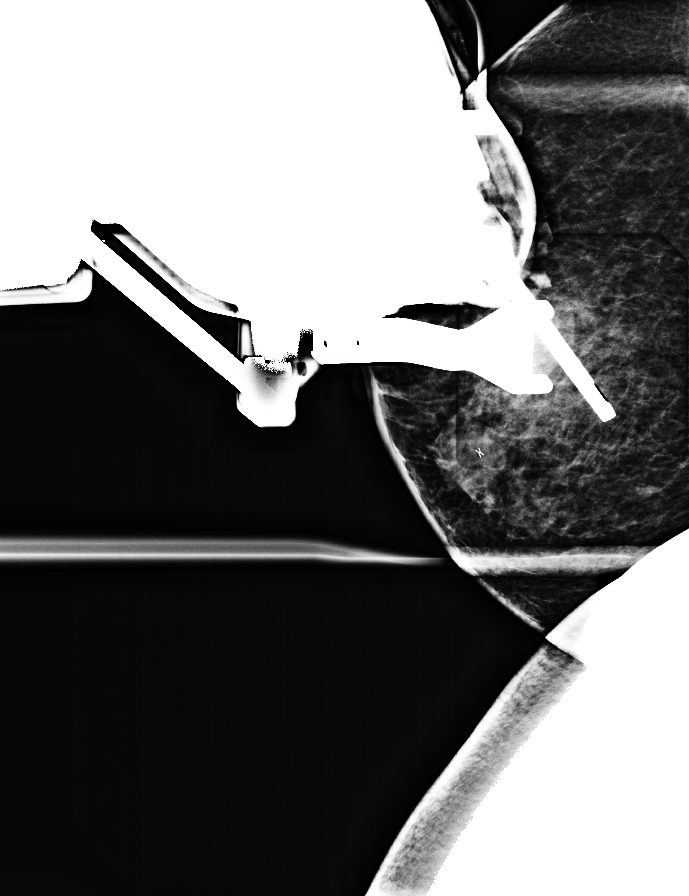

[R ML (4 of 4)]
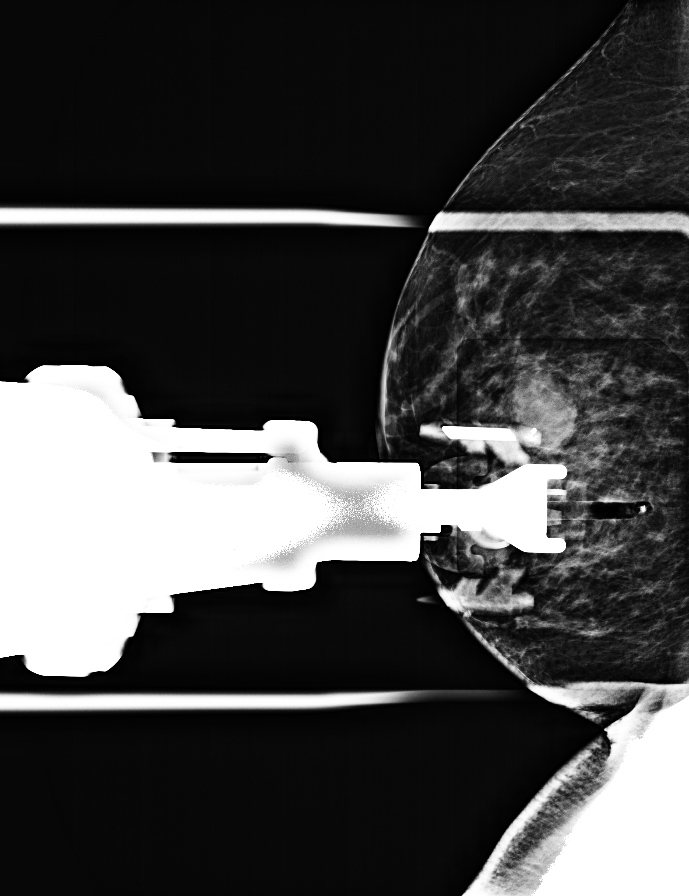

[R CC synth-2D]
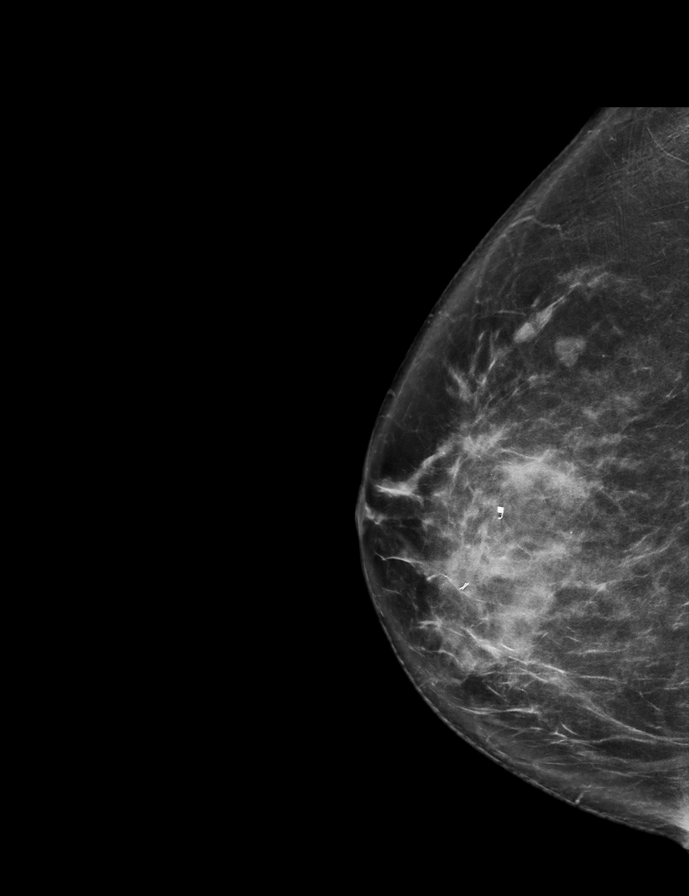

[R ML synth-2D]
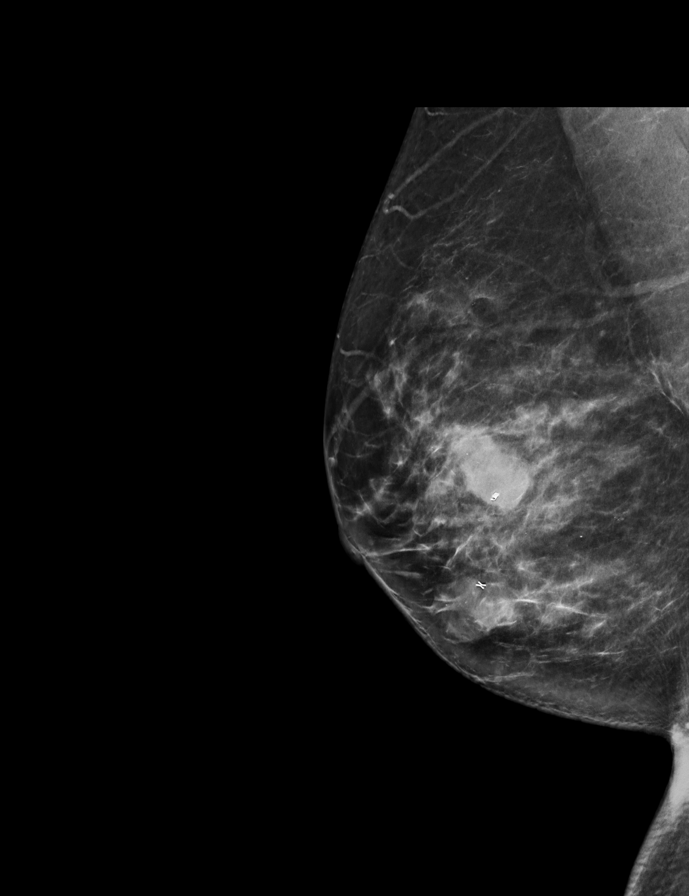

[8 of 38 positions shown; findings below may reference images not displayed]



Using sterile technique and 1% Lidocaine as local anesthetic, under
stereotactic guidance, a 9 gauge vacuum assisted device was used to
perform core needle biopsy of calcifications within the posterior
INNER RIGHT breast using a MEDIAL approach. Specimen radiograph was
performed showing calcifications. Specimens with calcifications are
identified for pathology.

At the conclusion of the procedure, a RIBBON tissue marker clip was
deployed into the biopsy cavity. Follow-up 2-view mammogram was
performed and dictated separately.
IMPRESSION: Stereotactic-guided biopsy of 0.5 cm group of posterior INNER RIGHT
breast calcifications. No apparent complications.

ADDENDUM:
Pathology revealed FOCAL ATYPICAL DUCTAL HYPERPLASIA WITH
CALCIFICATIONS, FIBROCYSTIC CHANGE AND USUAL DUCTAL HYPERPLASIA WITH
CALCIFICATIONS of the RIGHT breast, posterior inner. This was found
to be concordant by Dr. HARVIE, with excision recommended.

The patient has an additional area of biopsy proven ATYPICAL DUCTAL
HYPERPLASIA (FOCAL) with MICROCALCIFICATIONS of the RIGHT breast, 5
o'clock, with excision recommended.

Pathology results were discussed with the patient by telephone. The
patient reported doing well after the biopsy with tenderness and
bruising at the site. Post biopsy instructions and care were
reviewed and questions were answered. The patient was encouraged to
call The [REDACTED] for any additional
concerns.

Surgical consultation has been arranged with Dr. HARVIE at
[REDACTED] on [DATE].

Pathology results reported by HARVIE, RN on [DATE].



Using sterile technique and 1% Lidocaine as local anesthetic, under
stereotactic guidance, a 9 gauge vacuum assisted device was used to
perform core needle biopsy of calcifications within the posterior
INNER RIGHT breast using a MEDIAL approach. Specimen radiograph was
performed showing calcifications. Specimens with calcifications are
identified for pathology.

At the conclusion of the procedure, a RIBBON tissue marker clip was
deployed into the biopsy cavity. Follow-up 2-view mammogram was
performed and dictated separately.
IMPRESSION: Stereotactic-guided biopsy of 0.5 cm group of posterior INNER RIGHT
breast calcifications. No apparent complications.

## 2019-04-10 IMAGING — MG MM BREAST LOCALIZATION CLIP
4 series · 4 of 12 positions shown · non-contrast
Comparison: Previous exam(s).

CLINICAL DATA: Evaluate RIBBON clip following stereotactic guided
RIGHT breast biopsy.

EXAM:
DIAGNOSTIC RIGHT MAMMOGRAM POST STEREOTACTIC BIOPSY

[R ML synth-2D]
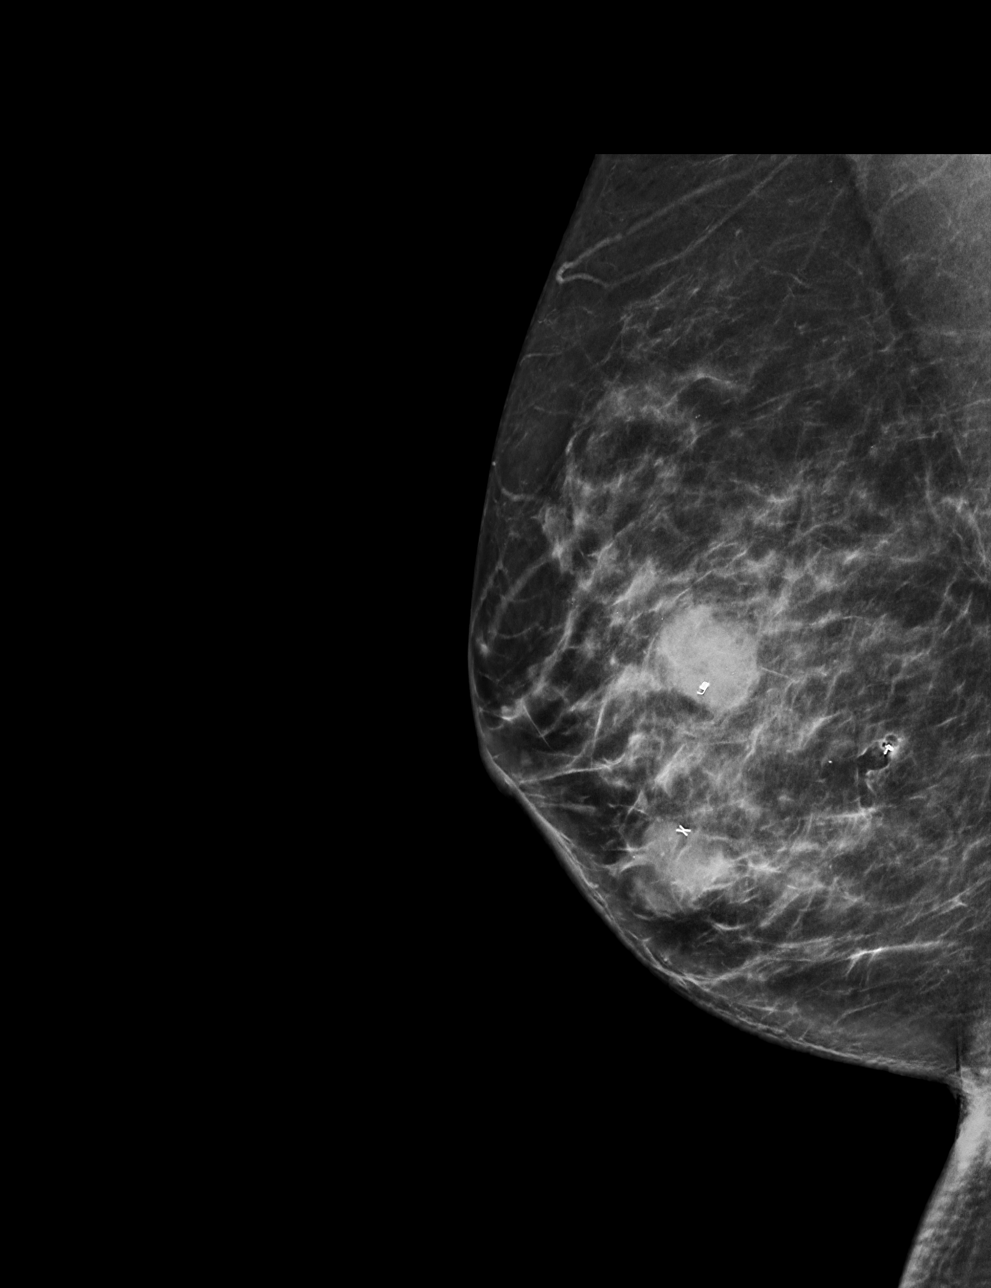

[R CC synth-2D]
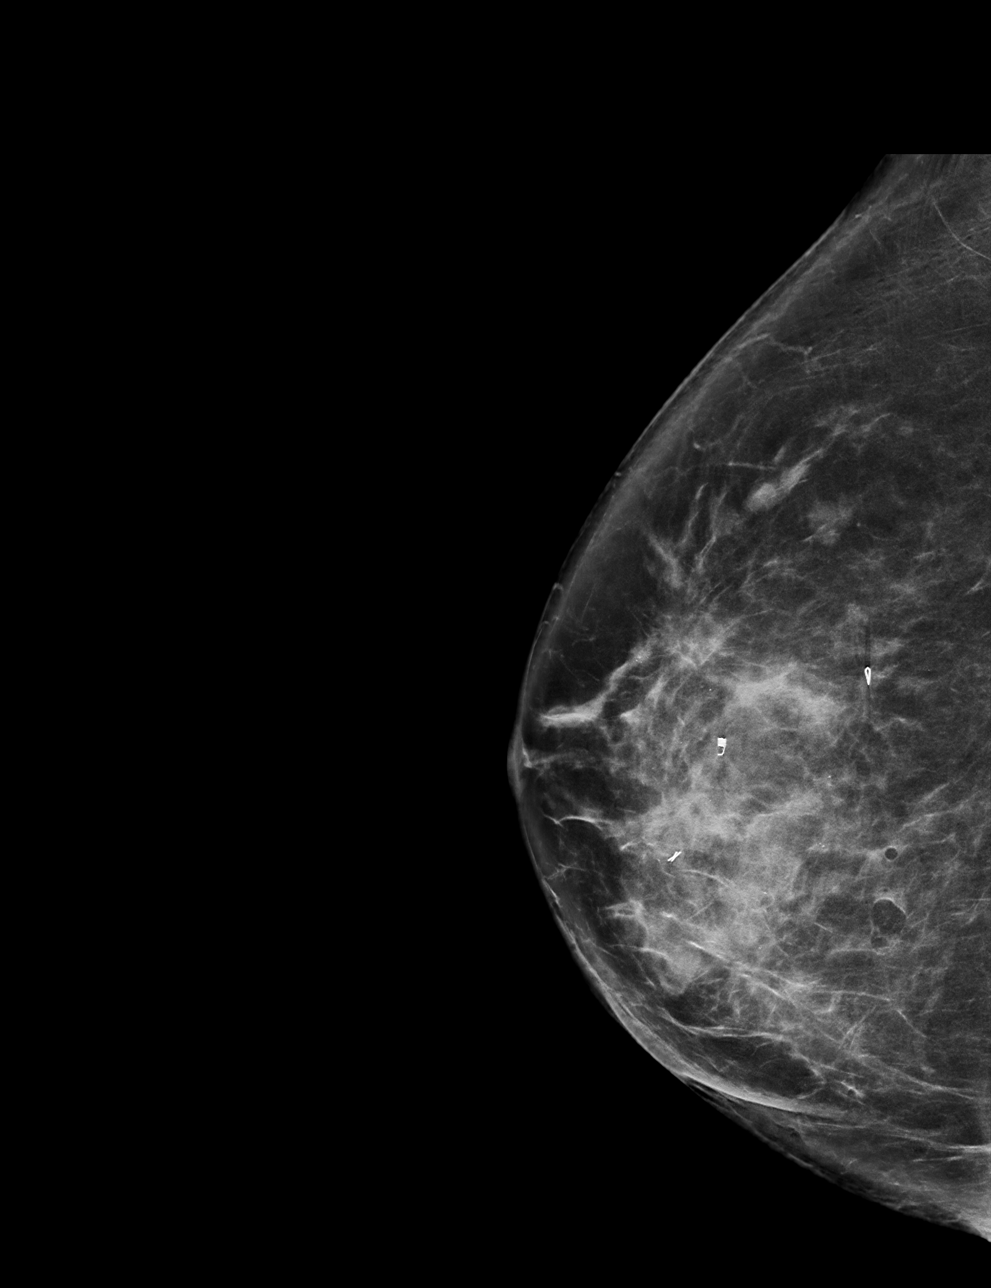

[R ML tomo · tomo slice 36/71.0]
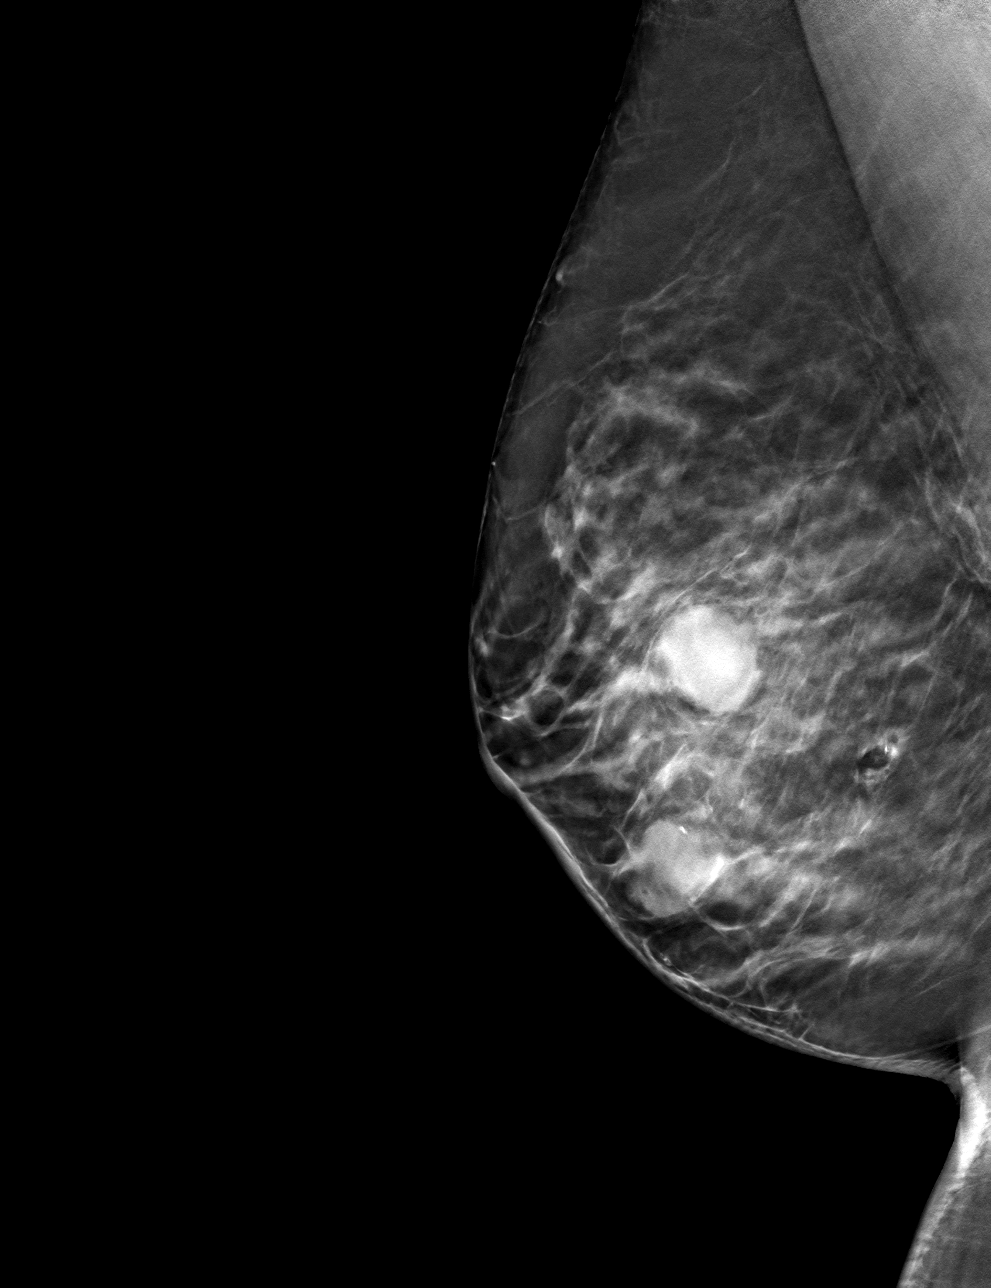

[R CC tomo · tomo slice 39/78.0]
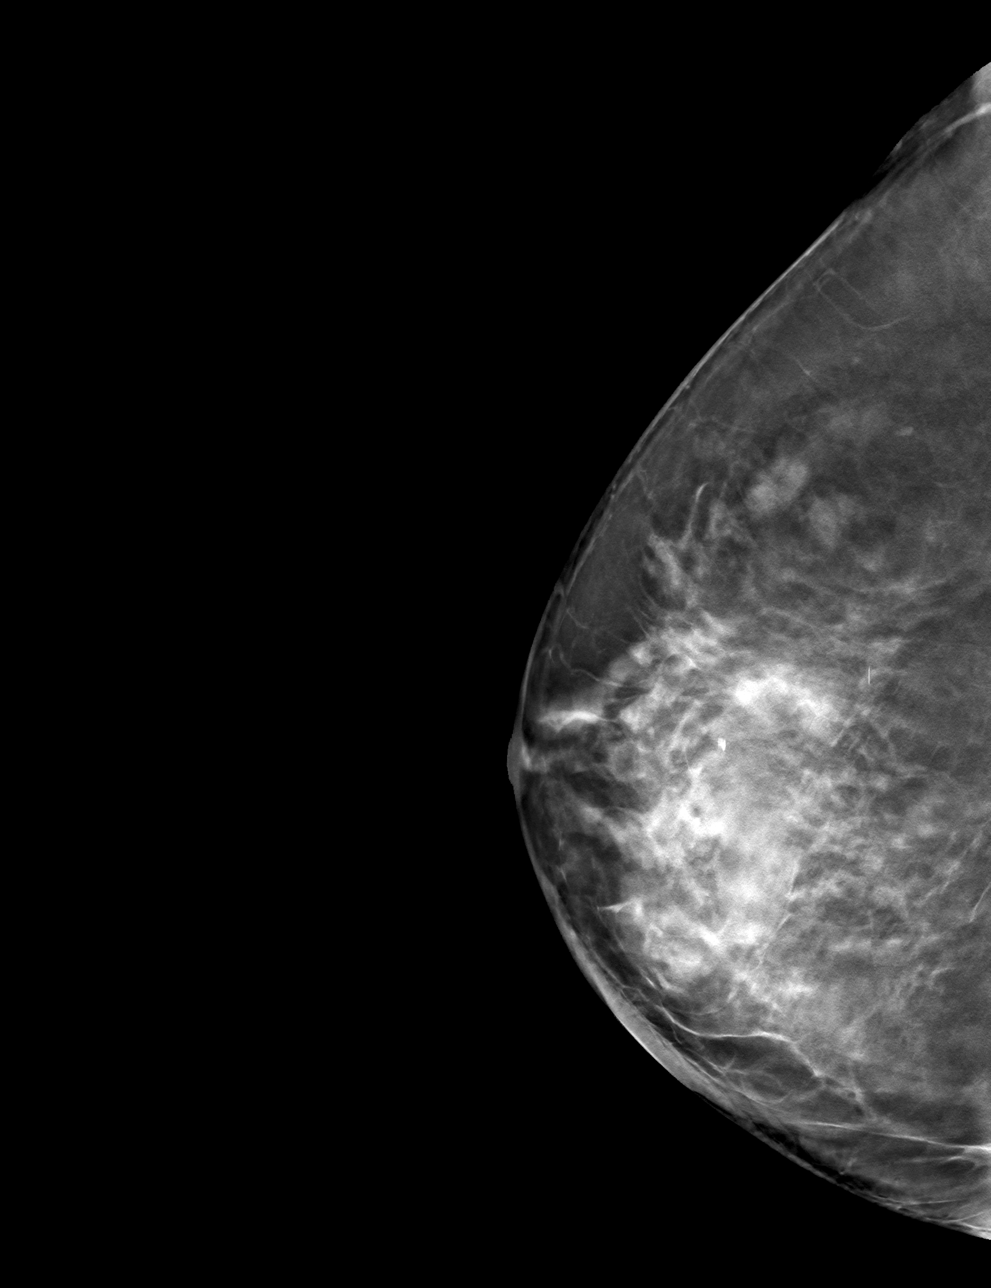

[4 of 12 positions shown; findings below may reference images not displayed]

FINDINGS: Mammographic images were obtained following stereotactic guided
biopsy of 0.5 cm group of calcifications within the posterior
slightly INNER RIGHT breast.

The RIBBON biopsy marking clip has migrated 1 cm LATERAL to the
expected location of the biopsied calcifications/biopsy site.

The current biopsy site lies approximately 3.5 cm posterior to the
biopsy-proven ADH (X clip).
IMPRESSION: 1 cm LATERAL migration of the RIBBON biopsy clip following
stereotactic guided RIGHT breast biopsy.

Final Assessment: Post Procedure Mammograms for Marker Placement

## 2019-04-18 ENCOUNTER — Inpatient Hospital Stay (HOSPITAL_COMMUNITY): Admission: RE | Admit: 2019-04-18 | Payer: 59 | Source: Ambulatory Visit

## 2019-04-22 ENCOUNTER — Encounter: Payer: Self-pay | Admitting: Cardiovascular Disease

## 2019-04-22 ENCOUNTER — Ambulatory Visit: Payer: Managed Care, Other (non HMO) | Admitting: Cardiovascular Disease

## 2019-04-22 ENCOUNTER — Other Ambulatory Visit: Payer: Self-pay

## 2019-04-22 ENCOUNTER — Ambulatory Visit (INDEPENDENT_AMBULATORY_CARE_PROVIDER_SITE_OTHER): Payer: Managed Care, Other (non HMO)

## 2019-04-22 VITALS — BP 126/62 | HR 98 | Ht 63.0 in | Wt 164.0 lb

## 2019-04-22 DIAGNOSIS — I2510 Atherosclerotic heart disease of native coronary artery without angina pectoris: Secondary | ICD-10-CM | POA: Diagnosis not present

## 2019-04-22 DIAGNOSIS — E785 Hyperlipidemia, unspecified: Secondary | ICD-10-CM | POA: Diagnosis not present

## 2019-04-22 DIAGNOSIS — I10 Essential (primary) hypertension: Secondary | ICD-10-CM | POA: Diagnosis not present

## 2019-04-22 LAB — EXERCISE TOLERANCE TEST
Estimated workload: 7.9 METS
Exercise duration (min): 6 min
Exercise duration (sec): 20 s
MPHR: 169 {beats}/min
Peak HR: 151 {beats}/min
Percent HR: 89 %
RPE: 15
Rest HR: 92 {beats}/min

## 2019-04-22 NOTE — Patient Instructions (Signed)
Medication Instructions:  *If you need a refill on your cardiac medications before your next appointment, please call your pharmacy*  Lab Work: If you have labs (blood work) drawn today and your tests are completely normal, you will receive your results only by: . MyChart Message (if you have MyChart) OR . A paper copy in the mail If you have any lab test that is abnormal or we need to change your treatment, we will call you to review the results.  Follow-Up: At [facility], you and your health needs are our priority.  As part of our continuing mission to provide you with exceptional heart care, we have created designated Provider Care Teams.  These Care Teams include your primary Cardiologist (physician) and Advanced Practice Providers (APPs -  Physician Assistants and Nurse Practitioners) who all work together to provide you with the care you need, when you need it.  We recommend signing up for the patient portal called "MyChart".  Sign up information is provided on this After Visit Summary.  MyChart is used to connect with patients for Virtual Visits (Telemedicine).  Patients are able to view lab/test results, encounter notes, upcoming appointments, etc.  Non-urgent messages can be sent to your provider as well.   To learn more about what you can do with MyChart, go to [email or web address].    Your next appointment:   1 year(s)  The format for your next appointment:   In Person  Provider:   You may see Dr. Nishan or one of the following Advanced Practice Providers on your designated Care Team:    Lori Gerhardt, NP  Laura Ingold, NP  Jill McDaniel, NP     

## 2019-04-25 ENCOUNTER — Other Ambulatory Visit: Payer: Self-pay | Admitting: Surgery

## 2019-04-25 DIAGNOSIS — N6091 Unspecified benign mammary dysplasia of right breast: Secondary | ICD-10-CM

## 2019-04-29 ENCOUNTER — Other Ambulatory Visit: Payer: Self-pay | Admitting: Surgery

## 2019-04-29 DIAGNOSIS — N6091 Unspecified benign mammary dysplasia of right breast: Secondary | ICD-10-CM

## 2019-05-28 ENCOUNTER — Other Ambulatory Visit: Payer: Self-pay

## 2019-05-28 ENCOUNTER — Encounter (HOSPITAL_BASED_OUTPATIENT_CLINIC_OR_DEPARTMENT_OTHER): Payer: Self-pay | Admitting: Surgery

## 2019-06-02 ENCOUNTER — Encounter (HOSPITAL_BASED_OUTPATIENT_CLINIC_OR_DEPARTMENT_OTHER)
Admission: RE | Admit: 2019-06-02 | Discharge: 2019-06-02 | Disposition: A | Discharge status: Home / Self Care | Payer: Managed Care, Other (non HMO) | Source: Ambulatory Visit | Attending: Surgery | Admitting: Surgery

## 2019-06-02 ENCOUNTER — Other Ambulatory Visit (HOSPITAL_COMMUNITY)
Admission: RE | Admit: 2019-06-02 | Discharge: 2019-06-02 | Disposition: A | Discharge status: Home / Self Care | Payer: Managed Care, Other (non HMO) | Source: Ambulatory Visit | Attending: Surgery | Admitting: Surgery

## 2019-06-02 DIAGNOSIS — Z20822 Contact with and (suspected) exposure to covid-19: Secondary | ICD-10-CM | POA: Diagnosis not present

## 2019-06-02 DIAGNOSIS — Z01812 Encounter for preprocedural laboratory examination: Secondary | ICD-10-CM | POA: Insufficient documentation

## 2019-06-02 LAB — BASIC METABOLIC PANEL
Anion gap: 11 (ref 5–15)
BUN: 12 mg/dL (ref 6–20)
CO2: 27 mmol/L (ref 22–32)
Calcium: 9.8 mg/dL (ref 8.9–10.3)
Chloride: 101 mmol/L (ref 98–111)
Creatinine, Ser: 0.74 mg/dL (ref 0.44–1.00)
GFR calc Af Amer: >60 mL/min (ref >60)
GFR calc non Af Amer: >60 mL/min (ref >60)
Glucose, Bld: 103 mg/dL — ABNORMAL HIGH (ref 70–99)
Potassium: 3.4 mmol/L — ABNORMAL LOW (ref 3.5–5.1)
Sodium: 139 mmol/L (ref 135–145)

## 2019-06-02 LAB — SARS CORONAVIRUS 2 (TAT 6-24 HRS): SARS Coronavirus 2: NEGATIVE

## 2019-06-02 MED ORDER — ENSURE PRE-SURGERY PO LIQD: 296.0000 mL | Freq: Once | ORAL | Status: DC

## 2019-06-02 NOTE — Progress Notes (Signed)

## 2019-06-04 ENCOUNTER — Other Ambulatory Visit: Payer: Self-pay

## 2019-06-04 ENCOUNTER — Ambulatory Visit
Admission: RE | Admit: 2019-06-04 | Discharge: 2019-06-04 | Disposition: A | Discharge status: Home / Self Care | Payer: Medicaid Other | Source: Ambulatory Visit | Attending: Surgery | Admitting: Surgery

## 2019-06-04 DIAGNOSIS — N6091 Unspecified benign mammary dysplasia of right breast: Secondary | ICD-10-CM

## 2019-06-04 IMAGING — MG MM PLC BREAST LOC DEV 1ST LESION INC*R*
8 of 9 series · 8 of 9 positions shown · non-contrast
Comparison: Previous exam(s).

CLINICAL DATA: 53-year-old female for radioactive seed localization
of 2 separate areas of ADH within the RIGHT breast.

EXAM:
MAMMOGRAPHIC GUIDED RADIOACTIVE SEED LOCALIZATION OF THE RIGHT
BREAST X 2

[R CC (1 of 5)]
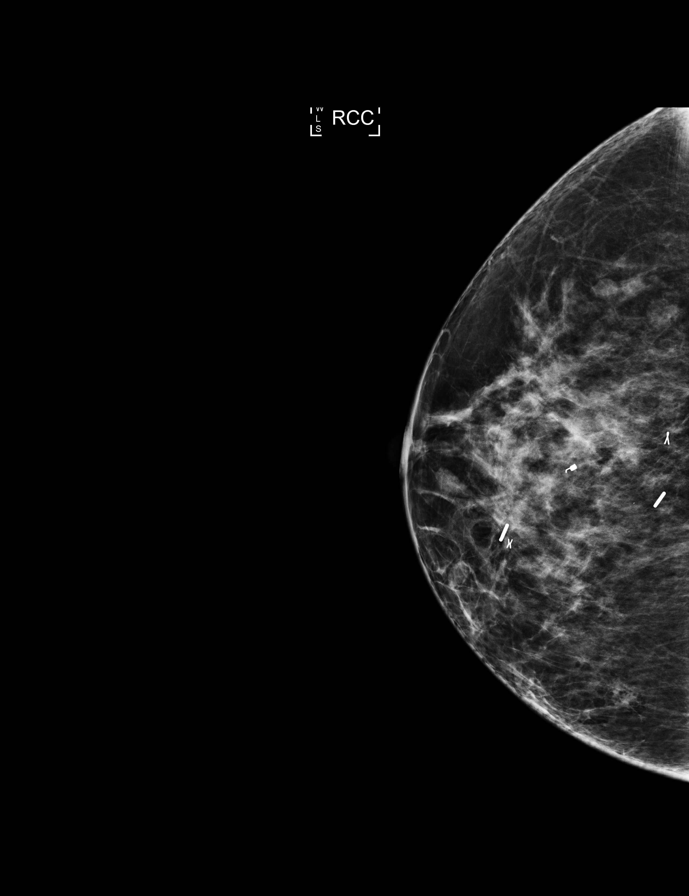

[R CC (2 of 5)]
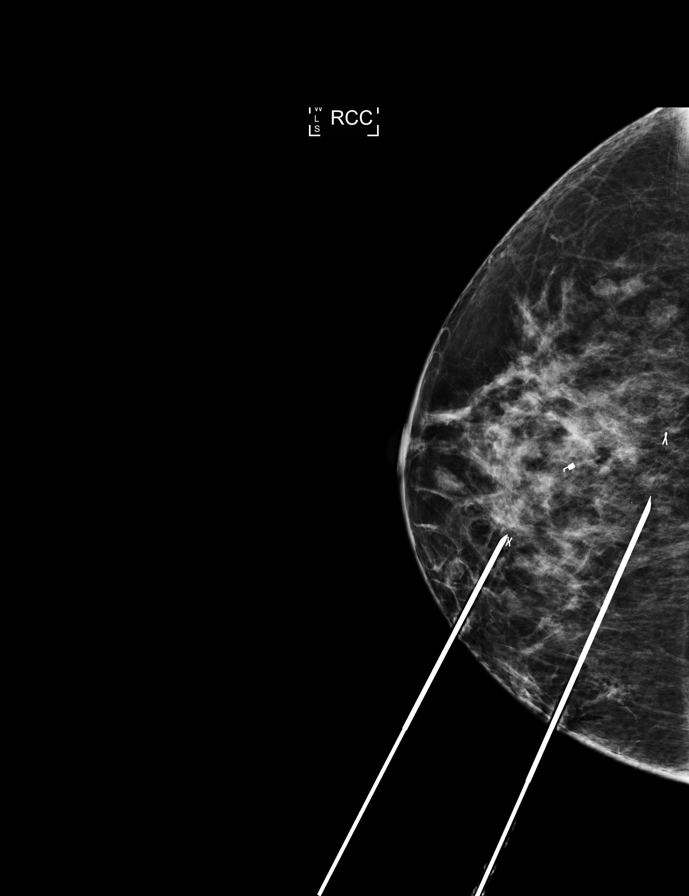

[R ML (1 of 3)]
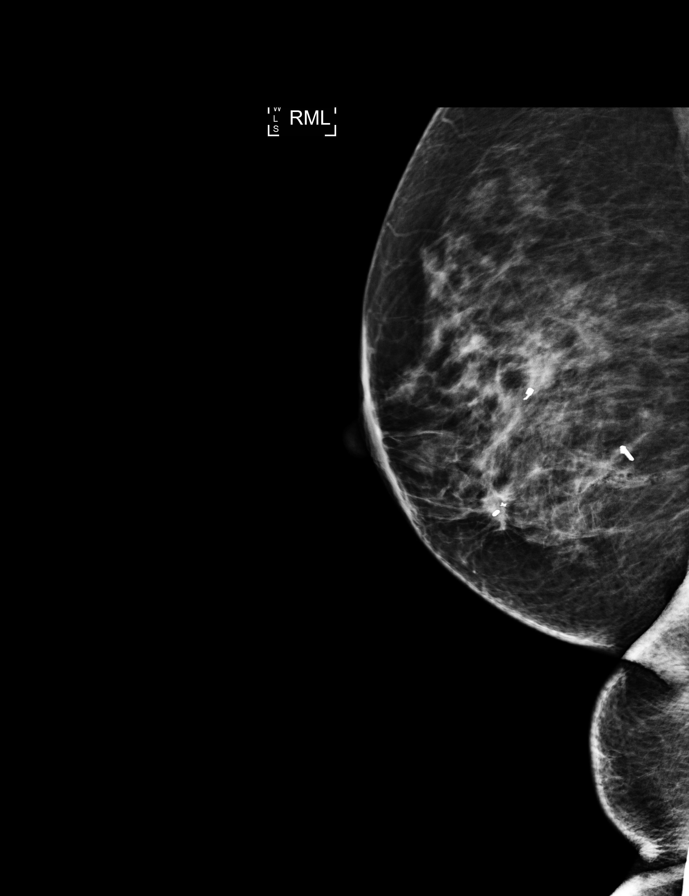

[R CC (3 of 5)]
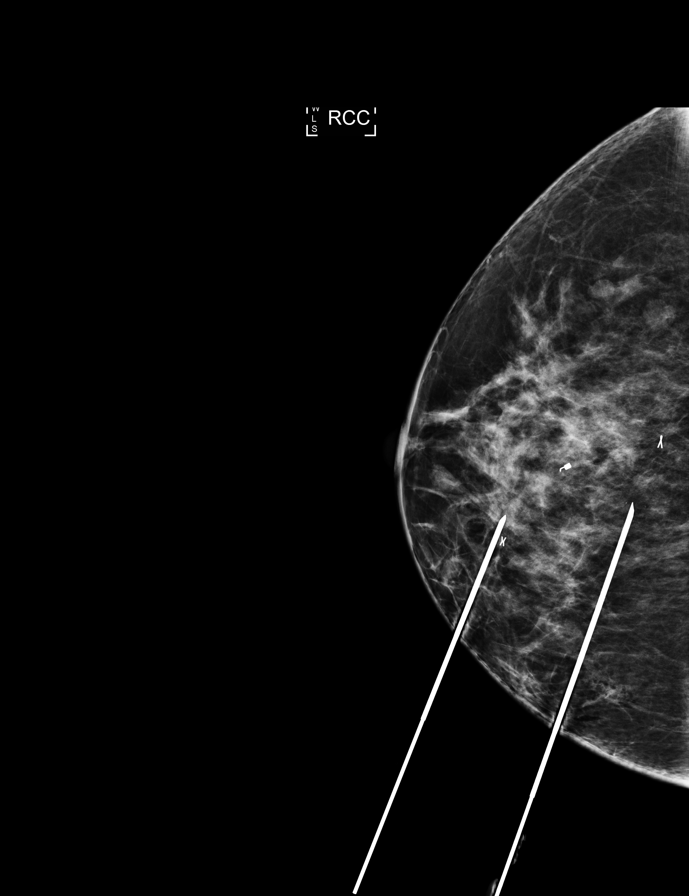

[R CC (4 of 5)]
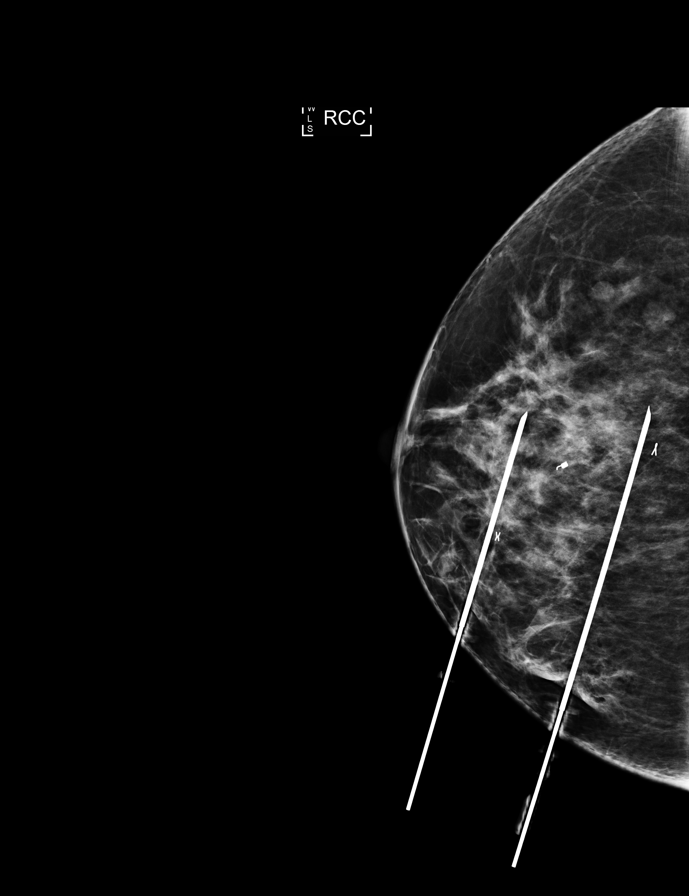

[R ML (2 of 3)]
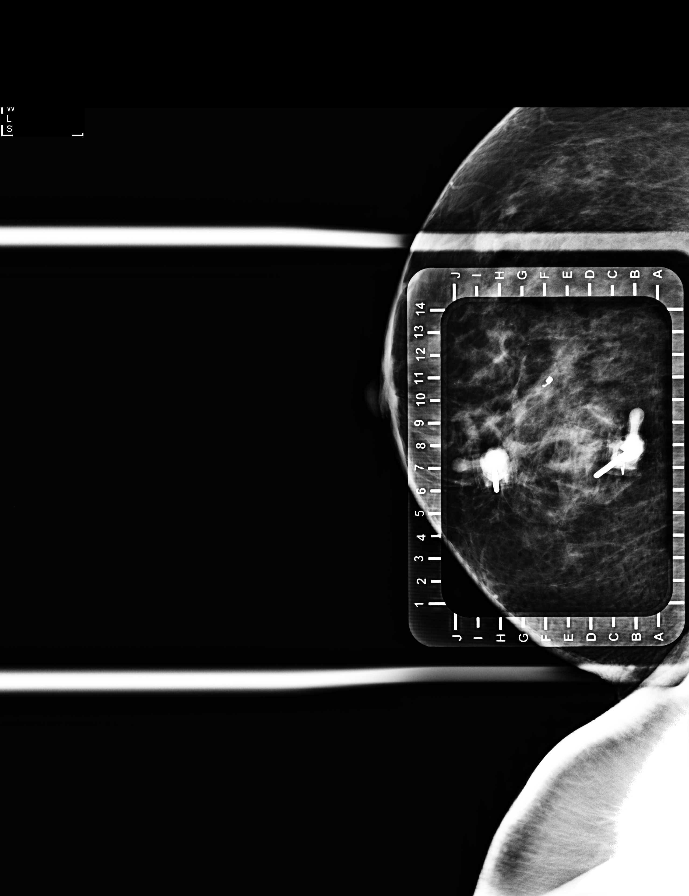

[R ML (3 of 3)]
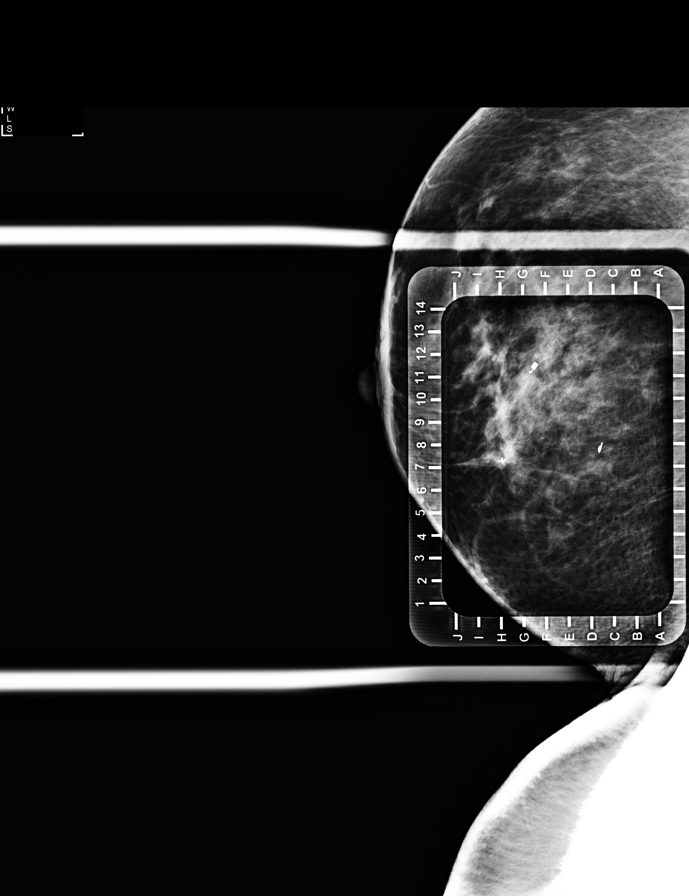

[R CC (5 of 5)]
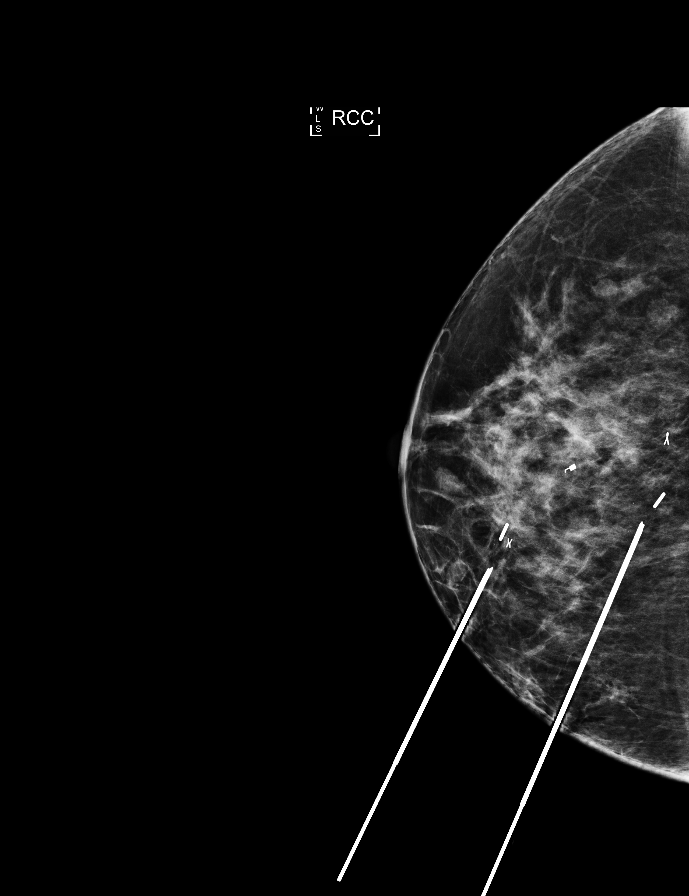

[8 of 9 positions shown; findings below may reference images not displayed]

FINDINGS: Patient presents for radioactive seed localization prior to 2
separate RIGHT breast excisions. I met with the patient and we
discussed the procedures of seed localization including benefits and
alternatives. We discussed the high likelihood of a successful
procedure. We discussed the risks of the procedures including
infection, bleeding, tissue injury and further surgery. We discussed
the low dose of radioactivity involved in the procedure. Informed,
written consent was given.

The usual time-out protocol was performed immediately prior to the
procedures.

Site 1:

MAMMOGRAPHIC GUIDED RADIOACTIVE SEED LOCALIZATION OF THE RIGHT
BREAST #1 (X clip):

Using mammographic guidance, sterile technique, 1% lidocaine and an
[3Y] radioactive seed, the X clip was localized using a MEDIAL
approach. The follow-up mammogram images confirm the seed in the
expected location and were marked for Dr. YEISO.

Follow-up survey of the patient confirms presence of the radioactive
seed.

Order number of [3Y] seed:  [PHONE_NUMBER].

Total activity:  0.253 millicuries.  Reference Date: [DATE].

Site 2:

MAMMOGRAPHIC GUIDED RADIOACTIVE SEED LOCALIZATION OF THE RIGHT
BREAST #2 (remaining calcifications 1 cm MEDIAL to the RIBBON clip):

Using mammographic guidance, sterile technique, 1% lidocaine and an
[3Y] radioactive seed, the remaining calcifications 1 cm MEDIAL to
the RIBBON clip were localized using a MEDIAL approach. The
follow-up mammogram images confirm the seed in the expected location
and were marked for Dr. YEISO.

Follow-up survey of the patient confirms presence of the radioactive
seed.

Order number of [3Y] seed:  [PHONE_NUMBER].

Total activity:  0.252 millicuries.  Reference Date: [DATE].

The patient tolerated the procedures well and was released from the
[REDACTED]. She was given instructions regarding seed removal.
IMPRESSION: Two separate radioactive seed localizations of the RIGHT breast. No
apparent complications.

## 2019-06-04 NOTE — H&P (Signed)
Lindsay Rios  Location: [facility] Patient #: [id number] DOB: 02/15/1965 Married / Language: English / Race: White Female   History of Present Illness  The patient is a 54 year old female who presents with a complaint of Breast problems.  Chief complaint: Atypical ductal hyperplasia of the right breast  This is a 54 year old female who presents with atypical ductal hyperplasia of the right breast. She has undergone screening mammography which showed a 5 cm area of calcifications in the right breast. She underwent biopsy of 3 areas. There was mostly fibrocystic changes but 2 areas did show atypical ductal hyperplasia. She has had no previous problems with her breasts. She denies nipple discharge. She has a strong family history of breast cancer with her paternal aunt having had breast cancer and early age. She is otherwise without complaints.   Past Surgical History Andreas Blower, [place];  Breast Biopsy  Right. multiple Hysterectomy (not due to cancer) - Partial   Diagnostic Studies History (Armen Glo Herring, CMA;  Colonoscopy  1-5 years ago Mammogram  within last year Pap Smear  1-5 years ago  Allergies (Armen Glo Herring, CMA Sulfa Antibiotics  Nausea.  Medication History (Armen Ferguson, CMA;  Aspirin (81MG  Tablet, Oral) Active. ZyrTEC (10MG  Tablet, Oral) Active. Vitamin D3 (10 MCG(400 UNIT) Tablet Chewable, Oral) Active. Dicyclomine HCl (20MG  Tablet, Oral) Active. Lisinopril-hydroCHLOROthiazide (20-12.5MG  Tablet, Oral) Active. Victoza (18MG /3ML Soln Pen-inj, Subcutaneous) Active. Lisinopril-hydroCHLOROthiazide (20-25MG  Tablet, Oral) Active. Vitamin D3 (1.25 MG(50000 UT) Tablet, Oral) Active. Jardiance (25MG  Tablet, Oral) Active. Basaglar KwikPen (100UNIT/ML Soln Pen-inj, Subcutaneous) Active. Medications Reconciled  Social History Andreas Blower, CMA; Alcohol use  Occasional alcohol use. Caffeine use  Carbonated beverages,  Tea. No drug use  Tobacco use  Never smoker.  Family History (Andreas Blower, [place]; Cerebrovascular Accident  Father. Diabetes Mellitus  Father. Heart Disease  Brother, Father. Heart disease in female family member before age 61  Hypertension  Father.  Pregnancy / Birth History Andreas Blower, [place]; Age at menarche  57 years. Age of menopause  66-50 Contraceptive History  Intrauterine device, Oral contraceptives. Gravida  2 Maternal age  43-30 Para  2 Regular periods   Other Problems (Armen Ferguson, CMA;  Diabetes Mellitus  High blood pressure  Hypercholesterolemia  Sleep Apnea     Review of Systems (Armen Glo Herring CMA;  General Present- Fatigue. Not Present- Appetite Loss, Chills, Fever, Night Sweats, Weight Gain and Weight Loss. Skin Present- Dryness. Not Present- Change in Wart/Mole, Hives, Jaundice, New Lesions, Non-Healing Wounds, Rash and Ulcer. HEENT Present- Wears glasses/contact lenses. Not Present- Earache, Hearing Loss, Hoarseness, Nose Bleed, Oral Ulcers, Ringing in the Ears, Seasonal Allergies, Sinus Pain, Sore Throat, Visual Disturbances and Yellow Eyes. Respiratory Present- Snoring. Not Present- Bloody sputum, Chronic Cough, Difficulty Breathing and Wheezing. Cardiovascular Not Present- Chest Pain, Difficulty Breathing Lying Down, Leg Cramps, Palpitations, Rapid Heart Rate, Shortness of Breath and Swelling of Extremities. Gastrointestinal Not Present- Abdominal Pain, Bloating, Bloody Stool, Change in Bowel Habits, Chronic diarrhea, Constipation, Difficulty Swallowing, Excessive gas, Gets full quickly at meals, Hemorrhoids, Indigestion, Nausea, Rectal Pain and Vomiting. Female Genitourinary Not Present- Frequency, Nocturia, Painful Urination, Pelvic Pain and Urgency. Neurological Not Present- Decreased Memory, Fainting, Headaches, Numbness, Seizures, Tingling, Tremor, Trouble walking and Weakness. Psychiatric Not Present- Anxiety, Bipolar, Change in  Sleep Pattern, Depression, Fearful and Frequent crying. Endocrine Present- Hot flashes. Not Present- Cold Intolerance, Excessive Hunger, Hair Changes, Heat Intolerance and New Diabetes. Hematology Not Present- Blood Thinners, Easy Bruising, Excessive bleeding, Gland problems, HIV and Persistent  Infections.  Vitals  Weight: 157.25 lb Height: 62in Body Surface Area: 1.73 m Body Mass Index: 28.76 kg/m  Temp.: 97.63F  Pulse: 108 (Regular)  P.OX: 96% (Room air) BP: 126/76(Sitting, Left Arm, Standard)    Physical Exam The physical exam findings are as follows: Note: On exam,  Is ecchymosis of the right breast and no palpable masses. The nipple areolar complex is normal. There is no supraclavicular or axillary adenopathy    Assessment & Plan   ATYPICAL DUCTAL HYPERPLASIA OF RIGHT BREAST (N60.91)  Impression: This is a patient with 2 areas of atypical ductal hyperplasia of the right breast. I discussed the diagnosis with the patient and her husband in detail. I gave them a copy of the pathology results. I have reviewed her mammograms and ultrasound as well as pathology results. I have reviewed her past medical history in Epic. We discussed the diagnosis in detail and the reasons for proceeding with further surgery. It is recommended she undergo a right breast radioactive seed guided lumpectomy 2 to remove these areas for complete histologic evaluation from malignancy. I discussed the surgical procedure with her in detail. I discussed the risk which includes but is not limited to bleeding, infection, injury to surrounding structures, the need for further surgery or studies if malignancy is found, cardiopulmonary issues, postoperative recovery, etc. After a thorough discussion, they wish to proceed with surgery which will be scheduled  This patient encounter took 45 minutes today to perform the following: take history, perform exam, review outside records, interpret imaging, counsel  the patient on their diagnosis and document encounter, findings & plan in the EHR

## 2019-06-05 ENCOUNTER — Other Ambulatory Visit: Payer: Self-pay

## 2019-06-05 ENCOUNTER — Ambulatory Visit (HOSPITAL_BASED_OUTPATIENT_CLINIC_OR_DEPARTMENT_OTHER)
Admission: RE | Admit: 2019-06-05 | Discharge: 2019-06-05 | Disposition: A | Discharge status: Home / Self Care | Payer: Managed Care, Other (non HMO) | Attending: Surgery | Admitting: Surgery

## 2019-06-05 ENCOUNTER — Encounter (HOSPITAL_BASED_OUTPATIENT_CLINIC_OR_DEPARTMENT_OTHER): Payer: Self-pay | Admitting: Surgery

## 2019-06-05 ENCOUNTER — Ambulatory Visit (HOSPITAL_BASED_OUTPATIENT_CLINIC_OR_DEPARTMENT_OTHER): Payer: Managed Care, Other (non HMO) | Admitting: Anesthesiology

## 2019-06-05 ENCOUNTER — Ambulatory Visit
Admission: RE | Admit: 2019-06-05 | Discharge: 2019-06-05 | Disposition: A | Discharge status: Home / Self Care | Payer: Managed Care, Other (non HMO) | Source: Ambulatory Visit | Attending: Surgery | Admitting: Surgery

## 2019-06-05 ENCOUNTER — Encounter (HOSPITAL_BASED_OUTPATIENT_CLINIC_OR_DEPARTMENT_OTHER)
Admission: RE | Disposition: A | Discharge status: Home / Self Care | Payer: Self-pay | Source: Home / Self Care | Attending: Surgery

## 2019-06-05 DIAGNOSIS — N6091 Unspecified benign mammary dysplasia of right breast: Secondary | ICD-10-CM

## 2019-06-05 DIAGNOSIS — Z7982 Long term (current) use of aspirin: Secondary | ICD-10-CM | POA: Insufficient documentation

## 2019-06-05 DIAGNOSIS — Z79899 Other long term (current) drug therapy: Secondary | ICD-10-CM | POA: Insufficient documentation

## 2019-06-05 DIAGNOSIS — Z7984 Long term (current) use of oral hypoglycemic drugs: Secondary | ICD-10-CM | POA: Insufficient documentation

## 2019-06-05 DIAGNOSIS — F329 Major depressive disorder, single episode, unspecified: Secondary | ICD-10-CM | POA: Diagnosis not present

## 2019-06-05 DIAGNOSIS — E119 Type 2 diabetes mellitus without complications: Secondary | ICD-10-CM | POA: Insufficient documentation

## 2019-06-05 DIAGNOSIS — I10 Essential (primary) hypertension: Secondary | ICD-10-CM | POA: Diagnosis not present

## 2019-06-05 DIAGNOSIS — G4730 Sleep apnea, unspecified: Secondary | ICD-10-CM | POA: Insufficient documentation

## 2019-06-05 HISTORY — PX: BREAST LUMPECTOMY WITH RADIOACTIVE SEED LOCALIZATION: SHX6424

## 2019-06-05 LAB — GLUCOSE, CAPILLARY
Glucose-Capillary: 144 mg/dL — ABNORMAL HIGH (ref 70–99)
Glucose-Capillary: 125 mg/dL — ABNORMAL HIGH (ref 70–99)

## 2019-06-05 IMAGING — DX MM BREAST SURGICAL SPECIMEN
2 series · 4 of 4 positions shown · non-contrast
Comparison: Previous exam(s).

CLINICAL DATA: Patient is post radioactive seed localization
subsequent surgical excision of 2 sites in the right breast marked
by coil shaped clip and X shaped clip.

EXAM:
SPECIMEN RADIOGRAPH OF THE RIGHT BREAST

[Series 2: specimen digital x-ray, derived · right · 0.10mm/px · 2 of 2 slices shown (1 of 2)]
[im 1/2]
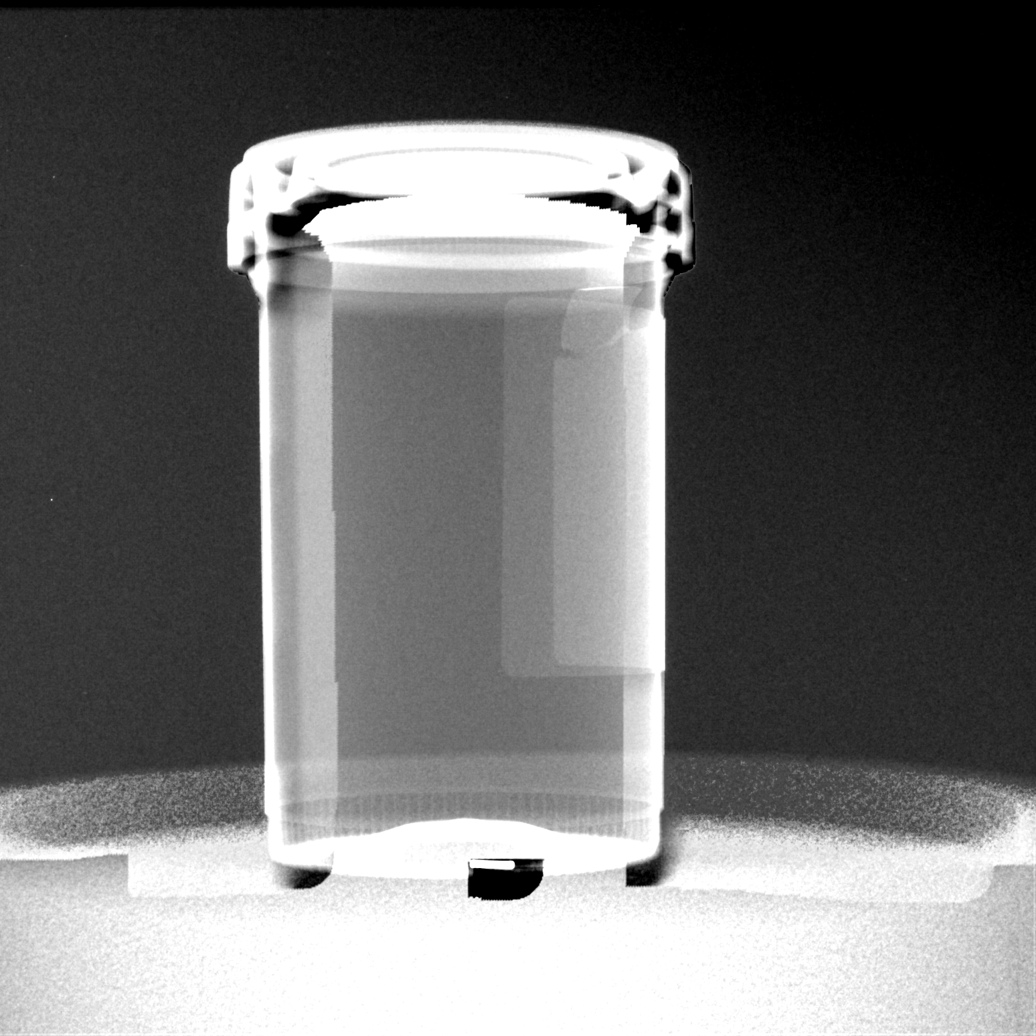
[im 2/2]
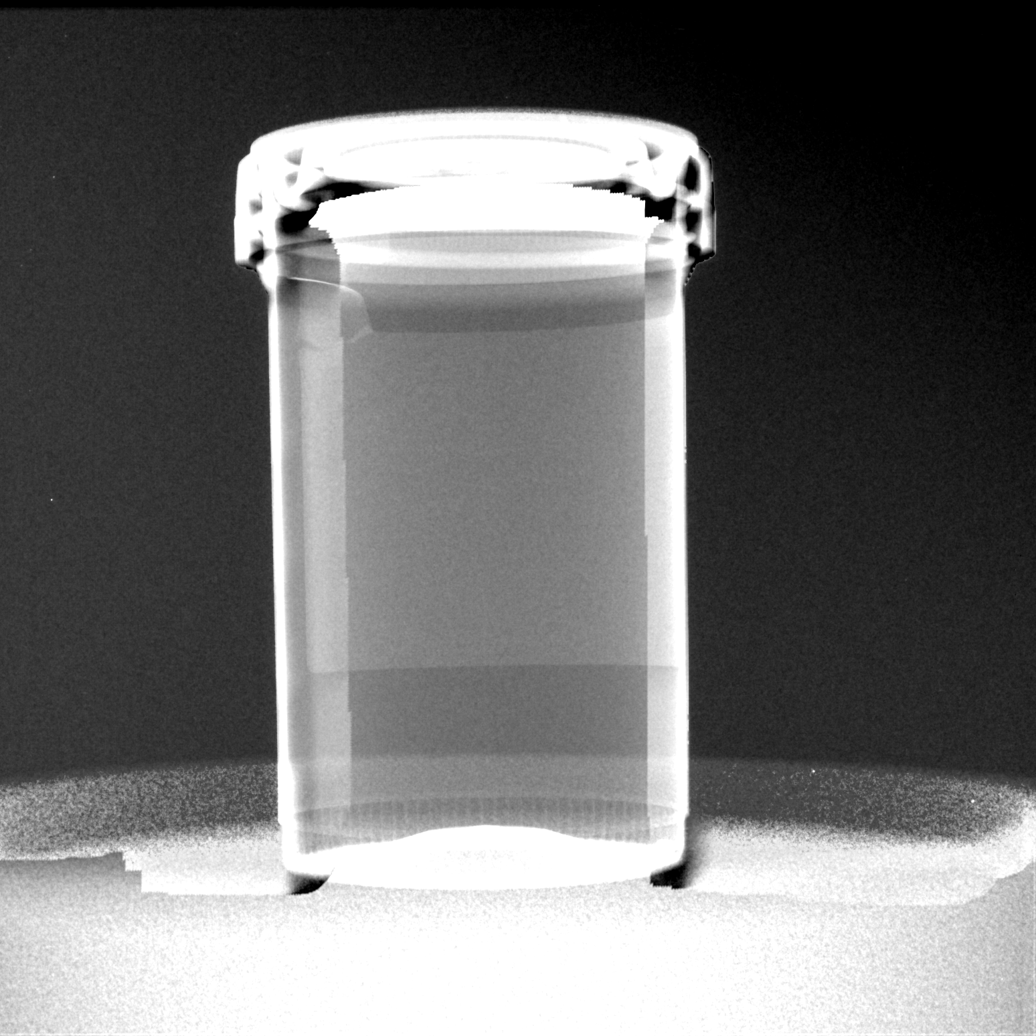

[Series 4: specimen digital x-ray, derived · right · 0.10mm/px · 2 of 2 slices shown (2 of 2)]
[im 1/2]
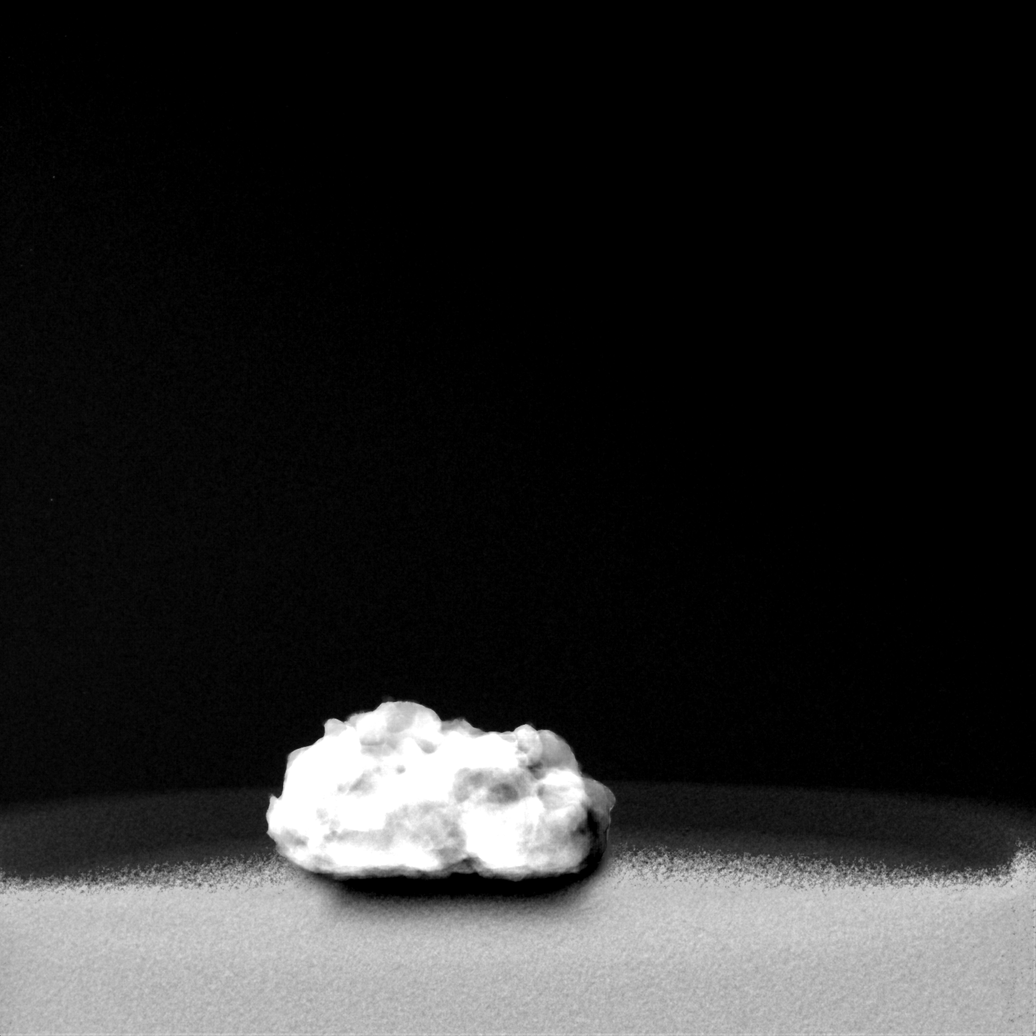
[im 2/2]
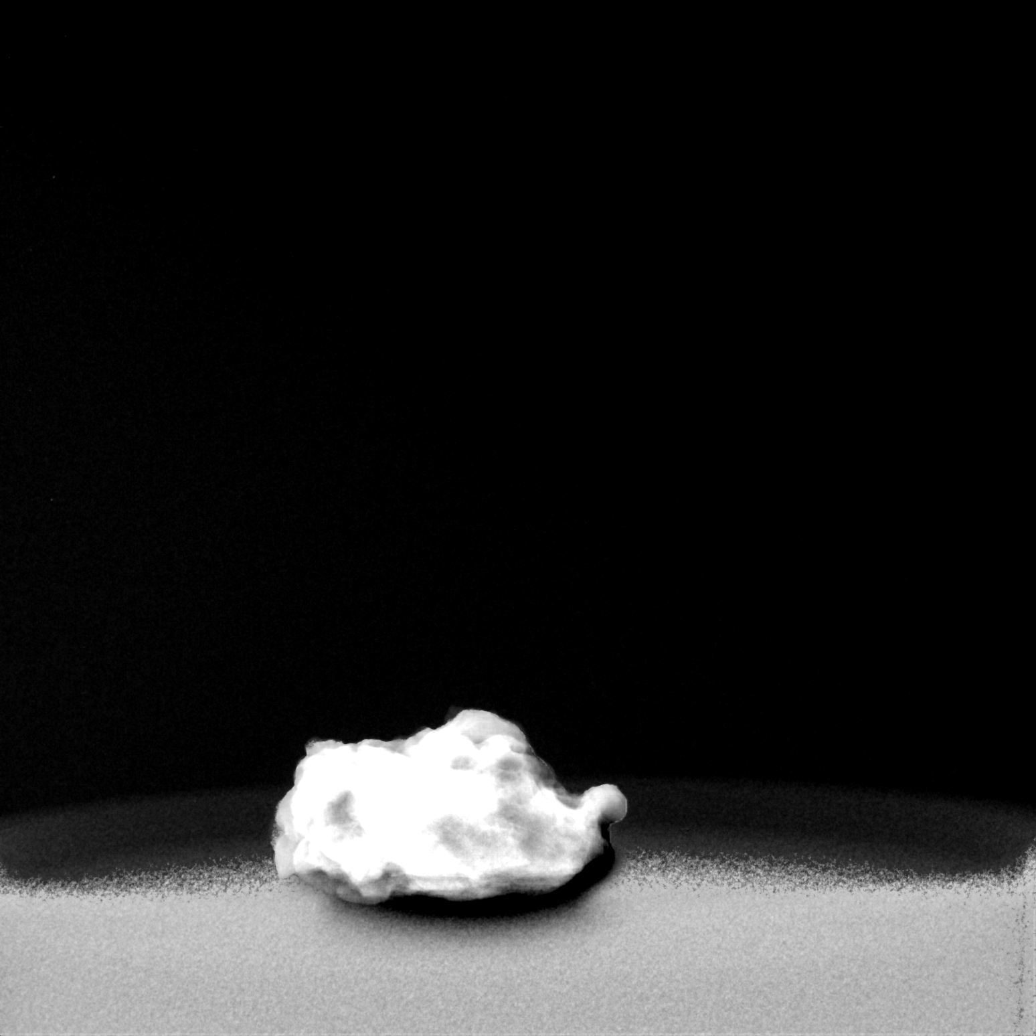

[4 of 4 positions shown; findings below may reference images not displayed]

FINDINGS: Status post excision of the right breast 2 sites. At both sites, the
radioactive seed and biopsy marker clip are present, completely
intact, and were marked for pathology. Specimens include the
targeted coil shaped clip as well as the X shaped clip.
IMPRESSION: Specimen radiograph of the right breast.

## 2019-06-05 IMAGING — DX MM BREAST SURGICAL SPECIMEN
1 series · 2 of 2 positions shown · non-contrast
Comparison: Previous exam(s).

CLINICAL DATA: Patient is post radioactive seed localization
subsequent surgical excision of 2 sites in the right breast marked
by coil shaped clip and X shaped clip.

EXAM:
SPECIMEN RADIOGRAPH OF THE RIGHT BREAST

[Series 2: specimen digital x-ray, derived · right · 0.10mm/px · 2 of 2 slices shown]
[im 1/2]
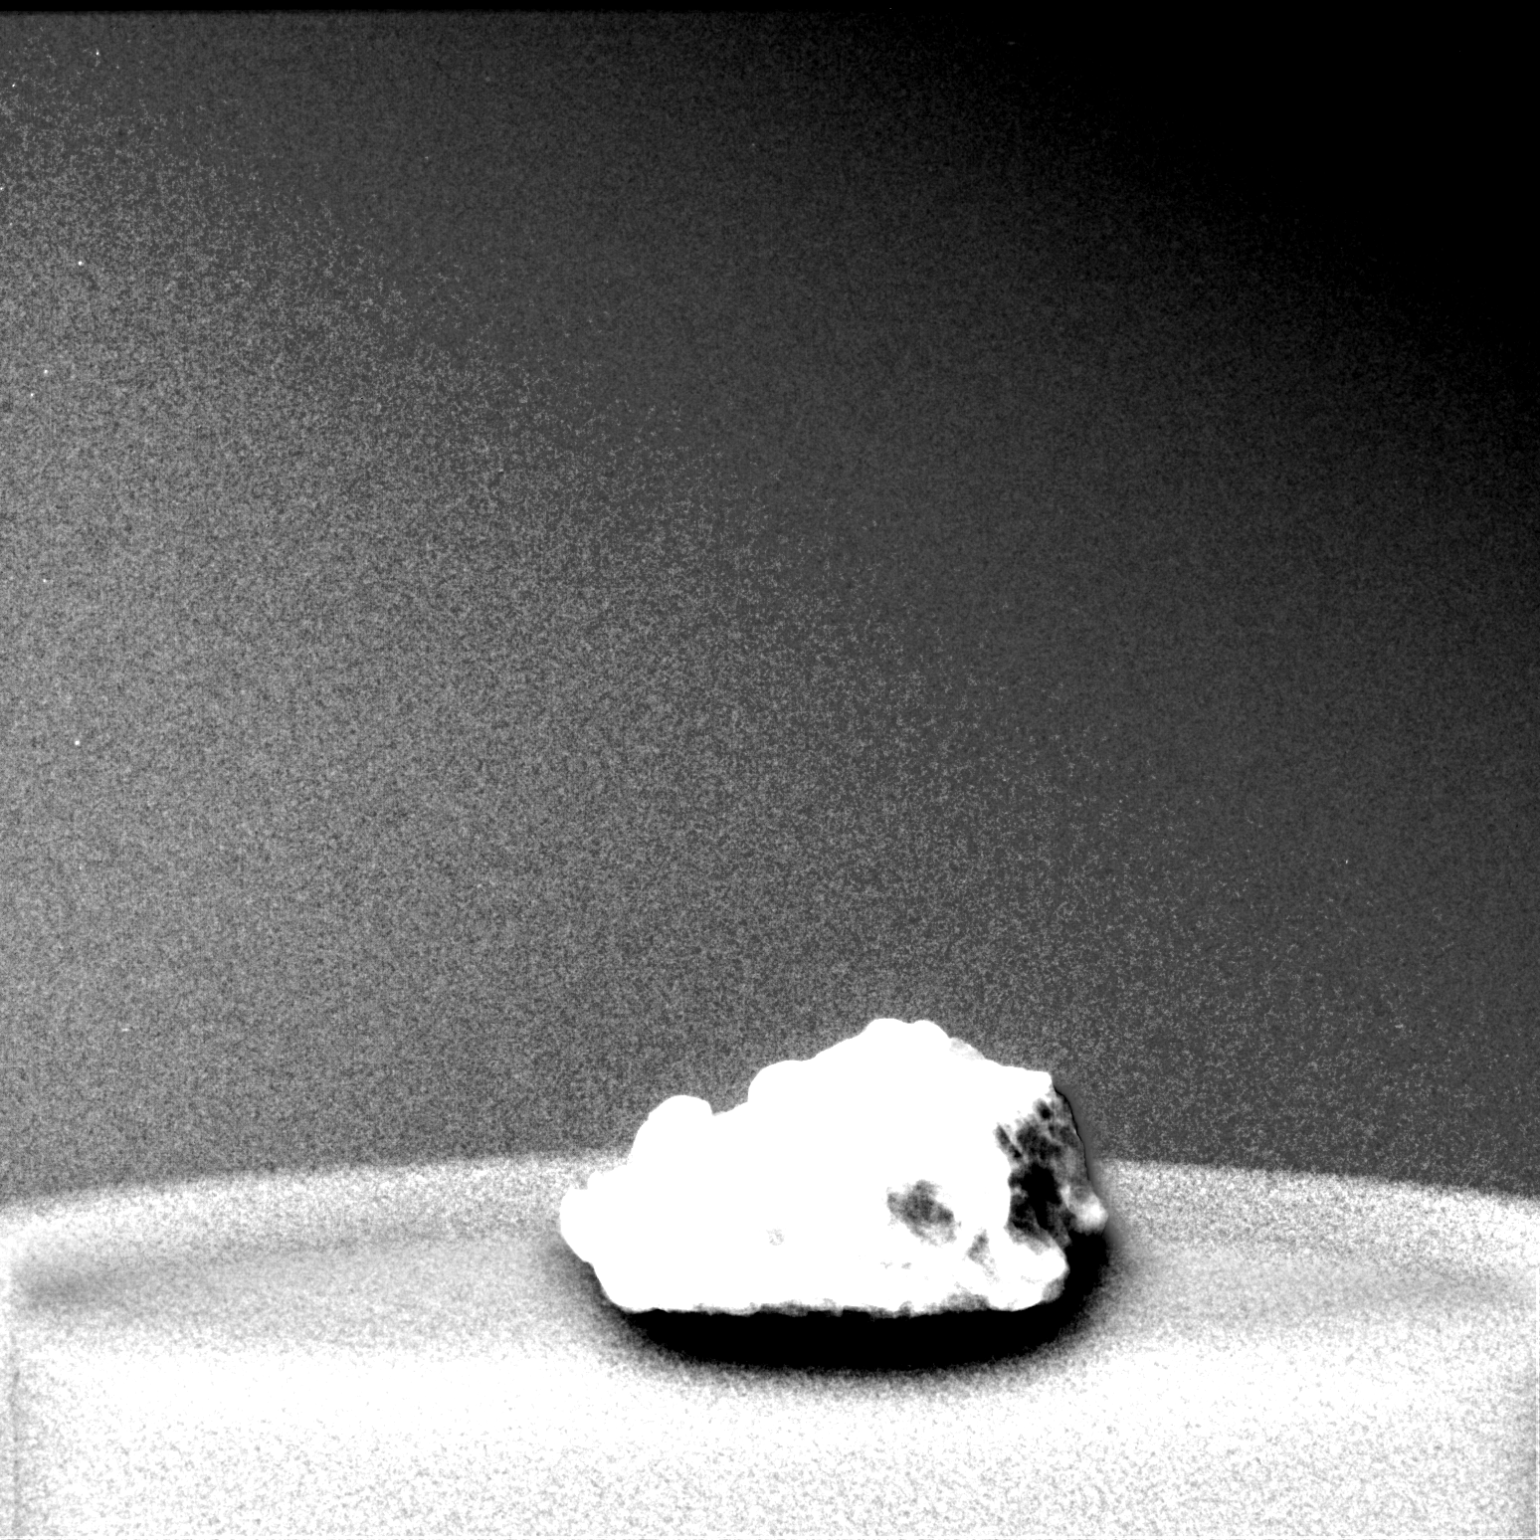
[im 2/2]
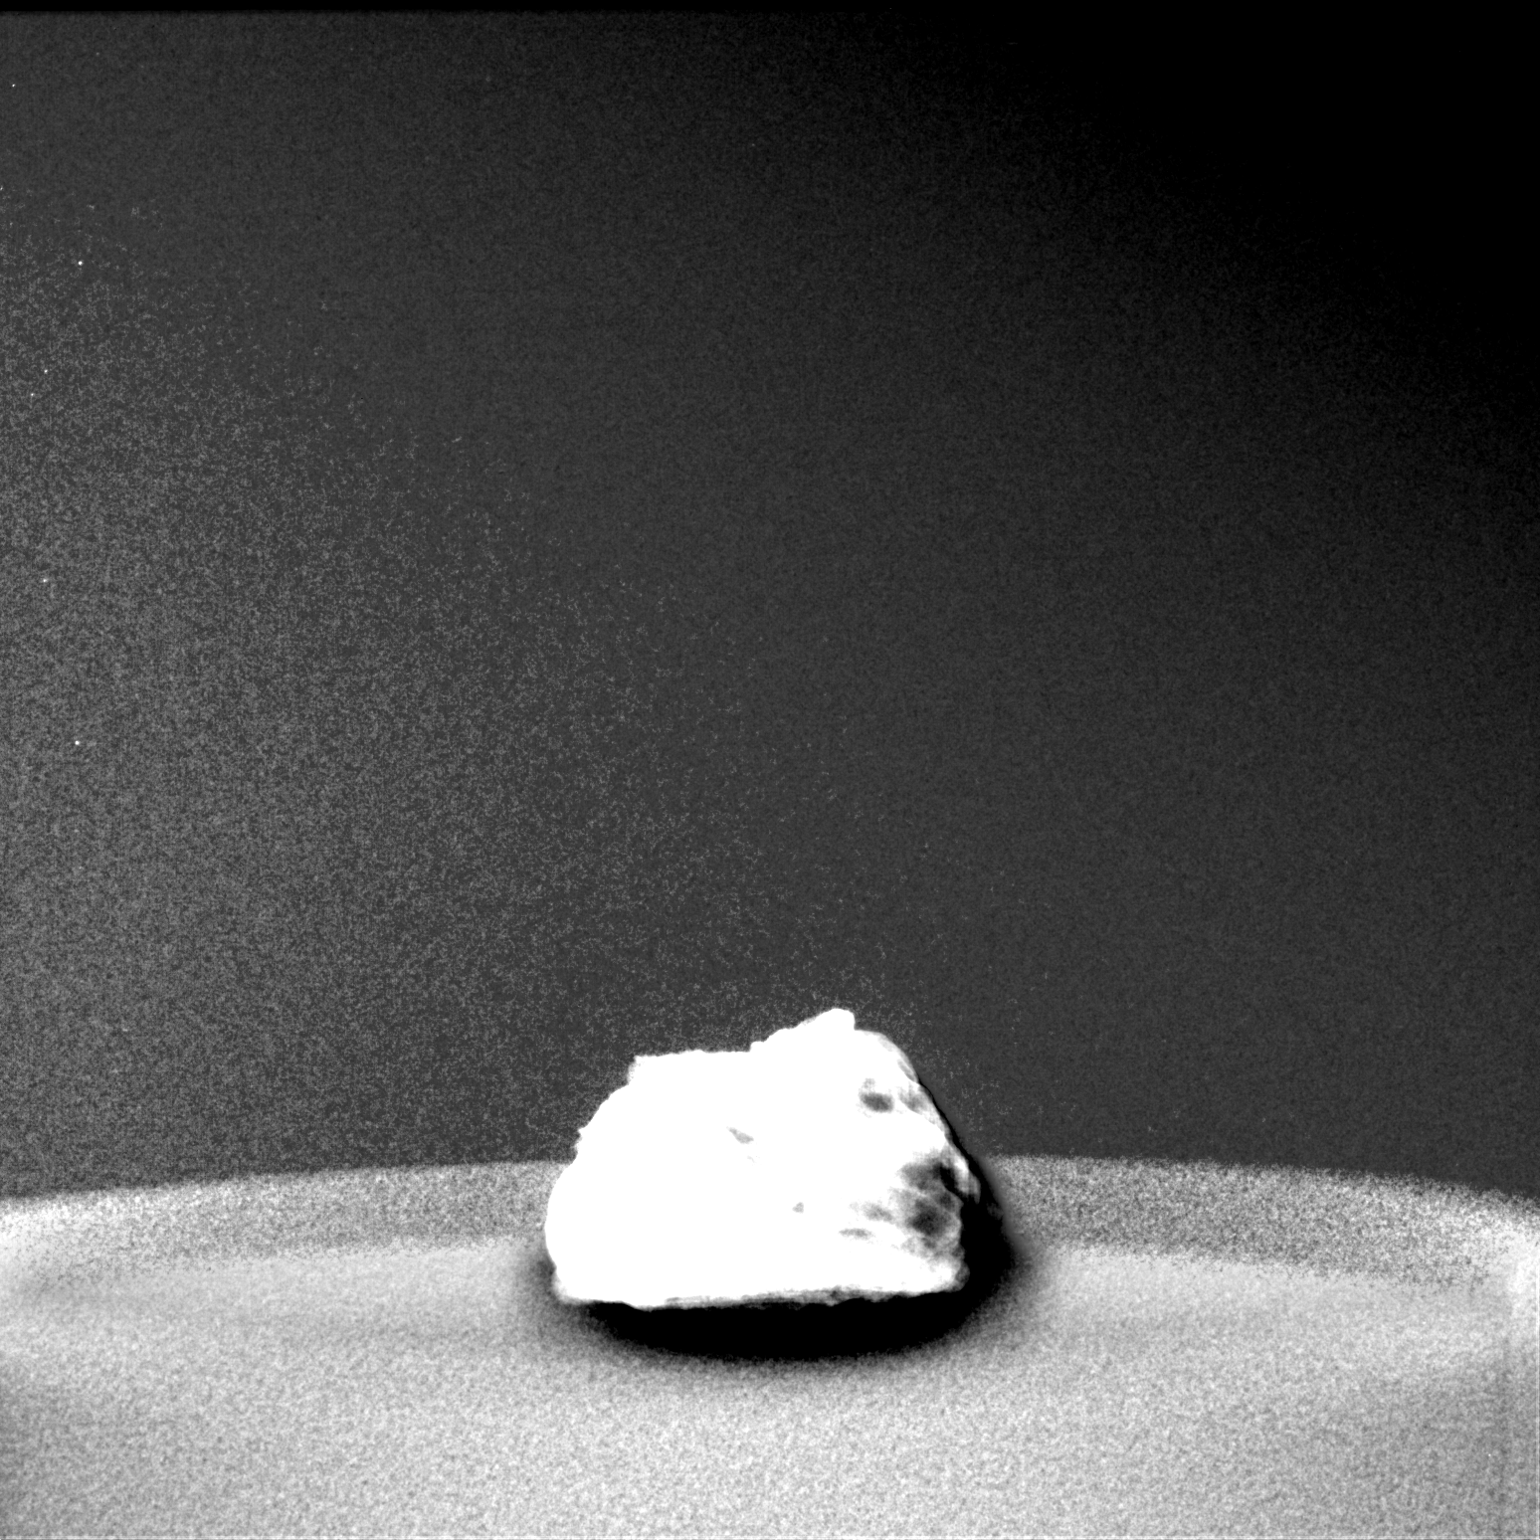

[2 of 2 positions shown; findings below may reference images not displayed]

FINDINGS: Status post excision of the right breast 2 sites. At both sites, the
radioactive seed and biopsy marker clip are present, completely
intact, and were marked for pathology. Specimens include the
targeted coil shaped clip as well as the X shaped clip.
IMPRESSION: Specimen radiograph of the right breast.

## 2019-06-05 SURGERY — BREAST LUMPECTOMY WITH RADIOACTIVE SEED LOCALIZATION
Anesthesia: General | Site: Breast | Laterality: Right

## 2019-06-05 MED ORDER — GABAPENTIN 300 MG PO CAPS
ORAL_CAPSULE | ORAL | Status: AC
  Filled 2019-06-05: qty 1

## 2019-06-05 MED ORDER — CEFAZOLIN SODIUM-DEXTROSE 2-4 GM/100ML-% IV SOLN
2.0000 g | INTRAVENOUS | Status: AC
  Administered 2019-06-05: 2 g via INTRAVENOUS

## 2019-06-05 MED ORDER — ACETAMINOPHEN 500 MG PO TABS
ORAL_TABLET | ORAL | Status: AC
  Filled 2019-06-05: qty 2

## 2019-06-05 MED ORDER — LIDOCAINE 2% (20 MG/ML) 5 ML SYRINGE
INTRAMUSCULAR | Status: DC | PRN
  Administered 2019-06-05: 60 mg via INTRAVENOUS

## 2019-06-05 MED ORDER — PHENYLEPHRINE 40 MCG/ML (10ML) SYRINGE FOR IV PUSH (FOR BLOOD PRESSURE SUPPORT)
PREFILLED_SYRINGE | INTRAVENOUS | Status: DC | PRN
  Administered 2019-06-05 (×2): 80 ug via INTRAVENOUS

## 2019-06-05 MED ORDER — LACTATED RINGERS IV SOLN: INTRAVENOUS | Status: DC

## 2019-06-05 MED ORDER — BUPIVACAINE HCL (PF) 0.5 % IJ SOLN
INTRAMUSCULAR | Status: DC | PRN
Start: 2019-06-05 — End: 2019-06-05
  Administered 2019-06-05: 20 mL

## 2019-06-05 MED ORDER — GABAPENTIN 300 MG PO CAPS
300.0000 mg | ORAL_CAPSULE | ORAL | Status: AC
  Administered 2019-06-05: 300 mg via ORAL

## 2019-06-05 MED ORDER — KETOROLAC TROMETHAMINE 15 MG/ML IJ SOLN
INTRAMUSCULAR | Status: AC
  Filled 2019-06-05: qty 1

## 2019-06-05 MED ORDER — DEXAMETHASONE SODIUM PHOSPHATE 4 MG/ML IJ SOLN
INTRAMUSCULAR | Status: DC | PRN
  Administered 2019-06-05: 5 mg via INTRAVENOUS

## 2019-06-05 MED ORDER — CHLORHEXIDINE GLUCONATE CLOTH 2 % EX PADS: 6.0000 | MEDICATED_PAD | Freq: Once | CUTANEOUS | Status: DC

## 2019-06-05 MED ORDER — HYDROMORPHONE HCL 1 MG/ML IJ SOLN: 0.2500 mg | INTRAMUSCULAR | Status: DC | PRN

## 2019-06-05 MED ORDER — FENTANYL CITRATE (PF) 100 MCG/2ML IJ SOLN
INTRAMUSCULAR | Status: DC | PRN
  Administered 2019-06-05 (×2): 50 ug via INTRAVENOUS

## 2019-06-05 MED ORDER — ACETAMINOPHEN 500 MG PO TABS
1000.0000 mg | ORAL_TABLET | ORAL | Status: AC
  Administered 2019-06-05: 1000 mg via ORAL

## 2019-06-05 MED ORDER — FENTANYL CITRATE (PF) 100 MCG/2ML IJ SOLN
INTRAMUSCULAR | Status: AC
  Filled 2019-06-05: qty 2

## 2019-06-05 MED ORDER — MIDAZOLAM HCL 2 MG/2ML IJ SOLN
INTRAMUSCULAR | Status: AC
  Filled 2019-06-05: qty 2

## 2019-06-05 MED ORDER — OXYCODONE HCL 5 MG PO TABS: 5.0000 mg | ORAL_TABLET | Freq: Four times a day (QID) | ORAL | 0 refills | Status: DC | PRN

## 2019-06-05 MED ORDER — FENTANYL CITRATE (PF) 100 MCG/2ML IJ SOLN: 50.0000 ug | INTRAMUSCULAR | Status: DC | PRN

## 2019-06-05 MED ORDER — MIDAZOLAM HCL 5 MG/5ML IJ SOLN
INTRAMUSCULAR | Status: DC | PRN
  Administered 2019-06-05: 2 mg via INTRAVENOUS

## 2019-06-05 MED ORDER — CEFAZOLIN SODIUM-DEXTROSE 2-4 GM/100ML-% IV SOLN
INTRAVENOUS | Status: AC
  Filled 2019-06-05: qty 100

## 2019-06-05 MED ORDER — KETOROLAC TROMETHAMINE 15 MG/ML IJ SOLN
15.0000 mg | INTRAMUSCULAR | Status: AC
  Administered 2019-06-05: 15 mg via INTRAVENOUS

## 2019-06-05 MED ORDER — PROPOFOL 10 MG/ML IV BOLUS
INTRAVENOUS | Status: DC | PRN
  Administered 2019-06-05: 150 mg via INTRAVENOUS

## 2019-06-05 MED ORDER — MIDAZOLAM HCL 2 MG/2ML IJ SOLN: 1.0000 mg | INTRAMUSCULAR | Status: DC | PRN

## 2019-06-05 MED ORDER — ONDANSETRON HCL 4 MG/2ML IJ SOLN
INTRAMUSCULAR | Status: DC | PRN
  Administered 2019-06-05: 4 mg via INTRAVENOUS

## 2019-06-05 SURGICAL SUPPLY — 31 items
BINDER BREAST XLRG (GAUZE/BANDAGES/DRESSINGS) ×2 IMPLANT
BLADE SURG 15 STRL LF DISP TIS (BLADE) ×1 IMPLANT
BLADE SURG 15 STRL SS (BLADE) ×2
CHLORAPREP W/TINT 26 (MISCELLANEOUS) ×2 IMPLANT
COVER BACK TABLE 60X90IN (DRAPES) ×2 IMPLANT
COVER MAYO STAND STRL (DRAPES) ×2 IMPLANT
COVER PROBE W GEL 5X96 (DRAPES) ×2 IMPLANT
DERMABOND ADVANCED (GAUZE/BANDAGES/DRESSINGS) ×1
DERMABOND ADVANCED .7 DNX12 (GAUZE/BANDAGES/DRESSINGS) ×1 IMPLANT
DRAPE LAPAROSCOPIC ABDOMINAL (DRAPES) ×2 IMPLANT
DRAPE UTILITY XL STRL (DRAPES) ×2 IMPLANT
ELECT REM PT RETURN 9FT ADLT (ELECTROSURGICAL) ×2
ELECTRODE REM PT RTRN 9FT ADLT (ELECTROSURGICAL) ×1 IMPLANT
GLOVE SURG SIGNA 7.5 PF LTX (GLOVE) ×2 IMPLANT
GOWN STRL REUS W/ TWL LRG LVL3 (GOWN DISPOSABLE) ×1 IMPLANT
GOWN STRL REUS W/ TWL XL LVL3 (GOWN DISPOSABLE) ×1 IMPLANT
GOWN STRL REUS W/TWL LRG LVL3 (GOWN DISPOSABLE) ×2
GOWN STRL REUS W/TWL XL LVL3 (GOWN DISPOSABLE) ×2
KIT MARKER MARGIN INK (KITS) ×2 IMPLANT
NEEDLE HYPO 25X1 1.5 SAFETY (NEEDLE) ×2 IMPLANT
NS IRRIG 1000ML POUR BTL (IV SOLUTION) ×2 IMPLANT
PENCIL SMOKE EVACUATOR (MISCELLANEOUS) ×2 IMPLANT
SET BASIN DAY SURGERY F.S. (CUSTOM PROCEDURE TRAY) ×2 IMPLANT
SLEEVE SCD COMPRESS KNEE MED (MISCELLANEOUS) ×2 IMPLANT
SPONGE LAP 4X18 RFD (DISPOSABLE) ×2 IMPLANT
SUT MNCRL AB 4-0 PS2 18 (SUTURE) ×2 IMPLANT
SUT VIC AB 3-0 SH 27 (SUTURE) ×2
SUT VIC AB 3-0 SH 27X BRD (SUTURE) ×1 IMPLANT
SYR CONTROL 10ML LL (SYRINGE) ×2 IMPLANT
TOWEL GREEN STERILE FF (TOWEL DISPOSABLE) ×2 IMPLANT
TRAY FAXITRON CT DISP (TRAY / TRAY PROCEDURE) ×2 IMPLANT

## 2019-06-05 NOTE — Interval H&P Note (Signed)
History and Physical Interval Note: no change in H and P  06/05/2019 8:17 AM  Lindsay Rios  has presented today for surgery, with the diagnosis of ATYPICAL DUCTAL HYPERPLASIA RIGHT BREAST.  The various methods of treatment have been discussed with the patient and family. After consideration of risks, benefits and other options for treatment, the patient has consented to  Procedure(s): [name] (Right) as a surgical intervention.  The patient's history has been reviewed, patient examined, no change in status, stable for surgery.  I have reviewed the patient's chart and labs.  Questions were answered to the patient's satisfaction.     Coralie Keens

## 2019-06-05 NOTE — Discharge Instructions (Signed)
[place] Office Phone Number [phone]  BREAST BIOPSY/ PARTIAL MASTECTOMY: POST OP INSTRUCTIONS  Always review your discharge instruction sheet given to you by the facility where your surgery was performed.  IF YOU HAVE DISABILITY OR FAMILY LEAVE FORMS, YOU MUST BRING THEM TO THE OFFICE FOR PROCESSING.  DO NOT GIVE THEM TO YOUR DOCTOR.  1. A prescription for pain medication may be given to you upon discharge.  Take your pain medication as prescribed, if needed.  If narcotic pain medicine is not needed, then you may take acetaminophen (Tylenol) or ibuprofen (Advil) as needed. 2. Take your usually prescribed medications unless otherwise directed 3. If you need a refill on your pain medication, please contact your pharmacy.  They will contact our office to request authorization.  Prescriptions will not be filled after 5pm or on week-ends. 4. You should eat very light the first 24 hours after surgery, such as soup, crackers, pudding, etc.  Resume your normal diet the day after surgery. 5. Most patients will experience some swelling and bruising in the breast.  Ice packs and a good support bra will help.  Swelling and bruising can take several days to resolve.  6. It is common to experience some constipation if taking pain medication after surgery.  Increasing fluid intake and taking a stool softener will usually help or prevent this problem from occurring.  A mild laxative (Milk of Magnesia or Miralax) should be taken according to package directions if there are no bowel movements after 48 hours. 7. Unless discharge instructions indicate otherwise, you may remove your bandages 24-48 hours after surgery, and you may shower at that time.  You may have steri-strips (small skin tapes) in place directly over the incision.  These strips should be left on the skin for 7-10 days.  If your surgeon used skin glue on the incision, you may shower in 24 hours.  The glue will flake off over the  next 2-3 weeks.  Any sutures or staples will be removed at the office during your follow-up visit. 8. ACTIVITIES:  You may resume regular daily activities (gradually increasing) beginning the next day.  Wearing a good support bra or sports bra minimizes pain and swelling.  You may have sexual intercourse when it is comfortable. a. You may drive when you no longer are taking prescription pain medication, you can comfortably wear a seatbelt, and you can safely maneuver your car and apply brakes. b. RETURN TO WORK:  ______________________________________________________________________________________ 9. You should see your doctor in the office for a follow-up appointment approximately two weeks after your surgery.  Your doctor's nurse will typically make your follow-up appointment when she calls you with your pathology report.  Expect your pathology report 2-3 business days after your surgery.  You may call to check if you do not hear from [place] after three days. 10. OTHER INSTRUCTIONS:OK TO REMOVE THE BINDER AND START SHOWERING TOMORROW 11. ICE PACK, TYLENOL, AND IBUPROFEN ALSO FOR PAIN 12. NO VIGOROUS ACTIVITY FOR ONE WEEK _______________________________________________________________________________________________ _____________________________________________________________________________________________________________________________________ _____________________________________________________________________________________________________________________________________ _____________________________________________________________________________________________________________________________________  WHEN TO CALL YOUR DOCTOR: 1. Fever over 101.0 2. Nausea and/or vomiting. 3. Extreme swelling or bruising. 4. Continued bleeding from incision. 5. Increased pain, redness, or drainage from the incision.  The clinic staff is available to answer your questions during regular business hours.   Please don't hesitate to call and ask to speak to one of the nurses for clinical concerns.  If you have a medical emergency, go to the nearest emergency room or  call 911.  A surgeon from [facility] is always on call at the hospital.  For further questions, please visit [email or web address]       Post Anesthesia Home Care Instructions  Activity: Get plenty of rest for the remainder of the day. A responsible individual must stay with you for 24 hours following the procedure.  For the next 24 hours, DO NOT: -Drive a car -Paediatric nurse -Drink alcoholic beverages -Take any medication unless instructed by your physician -Make any legal decisions or sign important papers.  Meals: Start with liquid foods such as gelatin or soup. Progress to regular foods as tolerated. Avoid greasy, spicy, heavy foods. If nausea and/or vomiting occur, drink only clear liquids until the nausea and/or vomiting subsides. Call your physician if vomiting continues.  Special Instructions/Symptoms: Your throat may feel dry or sore from the anesthesia or the breathing tube placed in your throat during surgery. If this causes discomfort, gargle with warm salt water. The discomfort should disappear within 24 hours.  If you had a scopolamine patch placed behind your ear for the management of post- operative nausea and/or vomiting:  1. The medication in the patch is effective for 72 hours, after which it should be removed.  Wrap patch in a tissue and discard in the trash. Wash hands thoroughly with soap and water. 2. You may remove the patch earlier than 72 hours if you experience unpleasant side effects which may include dry mouth, dizziness or visual disturbances. 3. Avoid touching the patch. Wash your hands with soap and water after contact with the patch.      No tylenol or ibuprofen until after 2pm today.

## 2019-06-05 NOTE — Anesthesia Procedure Notes (Addendum)
Procedure Name: LMA Insertion Date/Time: 06/05/2019 8:31 AM Performed by: Lieutenant Diego, CRNA Pre-anesthesia Checklist: Patient identified, Emergency Drugs available, Suction available and Patient being monitored Patient Re-evaluated:Patient Re-evaluated prior to induction Oxygen Delivery Method: Circle system utilized Preoxygenation: Pre-oxygenation with 100% oxygen Induction Type: IV induction Ventilation: Mask ventilation without difficulty LMA: LMA inserted LMA Size: 4.0 Number of attempts: 1 Placement Confirmation: positive ETCO2 and breath sounds checked- equal and bilateral Tube secured with: Tape Dental Injury: Teeth and Oropharynx as per pre-operative assessment

## 2019-06-05 NOTE — Anesthesia Postprocedure Evaluation (Signed)
Anesthesia Post Note  Patient: Avelin Saling  Procedure(s) Performed: [name] (Right Breast)     Patient location during evaluation: PACU Anesthesia Type: General Level of consciousness: awake and alert Pain management: pain level controlled Vital Signs Assessment: post-procedure vital signs reviewed and stable Respiratory status: spontaneous breathing, nonlabored ventilation and respiratory function stable Cardiovascular status: blood pressure returned to baseline and stable Postop Assessment: no apparent nausea or vomiting Anesthetic complications: no    Last Vitals:  Vitals:   06/05/19 1015 06/05/19 1030  BP: 119/76 123/74  Pulse: 69 69  Resp: 16 16  Temp:    SpO2: 100% 100%    Last Pain:  Vitals:   06/05/19 1045  TempSrc:   PainSc: 0-No pain                 Inocencia Murtaugh,W. EDMOND

## 2019-06-05 NOTE — Op Note (Signed)
[name]  Procedure Note  Lindsay Rios 06/05/2019   Pre-op Diagnosis: ATYPICAL DUCTAL HYPERPLASIA RIGHT BREAST x  2     Post-op Diagnosis: same  Procedure(s): [name]  Surgeon(s): Coralie Keens, MD  Anesthesia: General  Staff:  Circulator: Izora Ribas, RN Scrub Person: Flavia Shipper, CST  Estimated Blood Loss: Minimal               Specimens: sent to path  Indications: This is a 54 year old female who was found to have 2 abnormal areas in the right breast on screening mammography.  Biopsy of both these areas showed atypical ductal hyperplasia.  A lumpectomy removing both these areas was strongly recommended to rule out malignancy  Procedure: The patient was brought to the operating room and identifies correct patient.  She is placed upon the operating table general anesthesia was induced.  Her right breast was a prepped and draped in usual sterile fashion.  I located the seeds at the 4 and 5 o'clock position near the areola with the neoprobe.  I anesthetized skin with Marcaine and made a circumareolar incision with a scalpel.  I then dissected into the breast tissue with electrocautery.  I first identified the seed at the 4 o'clock position there was further away from the areola with the neoprobe.  I did performed a lumpectomy removing this area with the cautery.  I marked the margins with pain.  The seed came out of the specimen.  It was x-rayed and sent separately.  The specimen was then x-rayed confirming that the suspicious area and previous marker clip were in the lumpectomy.  This was then sent to pathology for evaluation.  I then again used the neoprobe to identify the area of the 5 o'clock position of the right breast.  I again performed a lumpectomy on this area with the aid of neoprobe staying widely around the area.  Once this area is removed, I marked  with marker pain and x-rayed the specimen confirming that the suspicious area and seed were in the specimen.  This was likewise sent to pathology evaluation.  I then achieved hemostasis with the cautery..  I anesthetized the wound further with Marcaine.  I then closed the subcutaneous tissue with interrupted 3-0 Vicryl sutures and closed skin with a running 4-0 Monocryl.  Dermabond and a binder were applied.  The patient tolerated the procedure well.  All the counts were correct at the end of the procedure.  The patient was then extubated in the operating room and taken in a stable condition to the recovery room.          Coralie Keens   Date: 06/05/2019  Time: 9:14 AM

## 2019-06-05 NOTE — Transfer of Care (Signed)
Immediate Anesthesia Transfer of Care Note  Patient: Lindsay Rios  Procedure(s) Performed: [name] (Right Breast)  Patient Location: PACU  Anesthesia Type:General  Level of Consciousness: awake  Airway & Oxygen Therapy: Patient Spontanous Breathing and Patient connected to face mask oxygen  Post-op Assessment: Report given to RN and Post -op Vital signs reviewed and stable  Post vital signs: Reviewed and stable  Last Vitals:  Vitals Value Taken Time  BP    Temp    Pulse 77 06/05/19 0919  Resp 16 06/05/19 0919  SpO2 99 % 06/05/19 0919  Vitals shown include unvalidated device data.  Last Pain:  Vitals:   06/05/19 0746  TempSrc: Oral  PainSc: 0-No pain         Complications: No apparent anesthesia complications

## 2019-06-05 NOTE — Anesthesia Preprocedure Evaluation (Addendum)
Anesthesia Evaluation  Patient identified by MRN, date of birth, ID band Patient awake    Reviewed: Allergy & Precautions, H&P , NPO status , Patient's Chart, lab work & pertinent test results  Airway Mallampati: III  TM Distance: >3 FB Neck ROM: Full    Dental no notable dental hx. (+) Teeth Intact, Dental Advisory Given   Pulmonary sleep apnea and Continuous Positive Airway Pressure Ventilation ,    Pulmonary exam normal breath sounds clear to auscultation       Cardiovascular hypertension, Pt. on medications  Rhythm:Regular Rate:Normal     Neuro/Psych Depression negative neurological ROS     GI/Hepatic negative GI ROS, Neg liver ROS,   Endo/Other  diabetes, Insulin Dependent, Oral Hypoglycemic Agents  Renal/GU negative Renal ROS  negative genitourinary   Musculoskeletal   Abdominal   Peds  Hematology negative hematology ROS (+)   Anesthesia Other Findings   Reproductive/Obstetrics negative OB ROS                            Anesthesia Physical Anesthesia Plan  ASA: III  Anesthesia Plan: General   Post-op Pain Management:    Induction: Intravenous  PONV Risk Score and Plan: 4 or greater and Ondansetron, Midazolam and Droperidol  Airway Management Planned: LMA  Additional Equipment:   Intra-op Plan:   Post-operative Plan: Extubation in OR  Informed Consent: I have reviewed the patients History and Physical, chart, labs and discussed the procedure including the risks, benefits and alternatives for the proposed anesthesia with the patient or authorized representative who has indicated his/her understanding and acceptance.     Dental advisory given  Plan Discussed with: CRNA  Anesthesia Plan Comments:         Anesthesia Quick Evaluation

## 2019-06-06 ENCOUNTER — Encounter: Payer: Self-pay | Admitting: *Deleted

## 2019-06-06 LAB — SURGICAL PATHOLOGY

## 2019-07-02 NOTE — Progress Notes (Signed)
[facility] CONSULT NOTE  Patient Care Team: [name], [place] as PCP - General (Nurse Practitioner)  CHIEF COMPLAINTS/PURPOSE OF CONSULTATION:  Newly diagnosed high risk for breast cancer  HISTORY OF PRESENTING ILLNESS:  Lindsay Rios 54 y.o. female is here because of recent diagnosis of high risk for breast cancer. Screening mammogram on 03/26/19 detected right breast calcifications, approximately 0.5cm. Biopsy on 04/01/19 showed atypical ductal hyperplasia. She underwent lumpectomy x2 on 06/05/19 with Dr. Ninfa Linden for which pathology showed atypical ductal hyperplasia at the 4 and 5 o'clock positions, no evidence of carcinoma. She presents to the clinic today for initial evaluation and to discuss surveillance options.   I reviewed her records extensively and collaborated the history with the patient.  MEDICAL HISTORY:  Past Medical History:  Diagnosis Date  . ADD (attention deficit disorder)   . Allergy   . Depression   . Ejection fraction   . Gestational diabetes 1999; 2002  . History of chicken pox   . 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 Hyperlipidemia   . Hypertension   . Numbness and tingling   . Osteopenia   . Pneumonia 2012  . Sleep apnea with use of continuous positive airway pressure (CPAP)   . Type II diabetes mellitus ([place])    "borderline; I'm on Metformin"    SURGICAL HISTORY: Past Surgical History:  Procedure Laterality Date  . ANAL RECTAL MANOMETRY N/A 03/17/2015   Procedure: ANO RECTAL MANOMETRY;  Surgeon: Mauri Pole, MD;  Location: [facility];  Service: Endoscopy;  Laterality: N/A;  . BREAST LUMPECTOMY WITH RADIOACTIVE SEED LOCALIZATION Right 06/05/2019   Procedure: [name];  Surgeon: Coralie Keens, MD;  Location: [facility];  Service: General;  Laterality: Right;  . LEFT HEART CATHETERIZATION WITH CORONARY ANGIOGRAM N/A 12/22/2013   Procedure: LEFT  HEART CATHETERIZATION WITH CORONARY ANGIOGRAM;  Surgeon: Jettie Booze, MD;  Location: [facility] CATH LAB;  Service: Cardiovascular;  Laterality: N/A;  . SHOULDER ARTHROSCOPY W/ ROTATOR CUFF REPAIR Right 1990's  . VAGINAL HYSTERECTOMY  2013   HPV/Cervical Dysplasia; partial    SOCIAL HISTORY: Social History   Socioeconomic History  . Marital status: Divorced    Spouse name: [name] (Separated)   . Number of children: 2  . Years of education: 45  . Highest education level: Not on file  Occupational History  . 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 Occupation: Psychologist, sport and exercise  Tobacco Use  . Smoking status: Never Smoker  . Smokeless tobacco: Never Used  Substance and Sexual Activity  . Alcohol use: No    Alcohol/week: 0.0 standard drinks    Comment: rare  . Drug use: No  . Sexual activity: Not Currently    Partners: Male  Other Topics Concern  . Not on file  Social History Narrative   Marital Status: Separated [name]) Significant Other [name])    Children:  Son (1) Daughter (1)    Pets: Dogs (2), Cat (1)    Living Situation: Lives with children.   Occupation: Full- Time Student    Education: [place] ([employer])    Tobacco Use: She has never smoked.     Alcohol Use:  Rarely   Drug Use:  None   Diet:  Regular   Exercise:  None   Hobbies: Shopping      Social Determinants of Health   Financial Resource Strain:   .  Difficulty of Paying Living Expenses:   Food Insecurity:   . Worried About Charity fundraiser in the Last Year:   . Arboriculturist in the Last Year:   Transportation Needs:   . Film/video editor (Medical):   Marland Kitchen Lack of Transportation (Non-Medical):   Physical Activity:   . Days of Exercise per Week:   . Minutes of Exercise per Session:   Stress:   . Feeling of Stress :   Social Connections:   . Frequency of Communication with Friends and Family:   . Frequency of Social Gatherings with Friends and Family:   . Attends  Religious Services:   . Active Member of Clubs or Organizations:   . Attends Archivist Meetings:   Marland Kitchen Marital Status:   Intimate Partner Violence:   . Fear of Current or Ex-Partner:   . Emotionally Abused:   Marland Kitchen Physically Abused:   . Sexually Abused:     FAMILY HISTORY: Family History  Problem Relation Age of Onset  . Hyperlipidemia Mother   . Hyperlipidemia Father   . Hypertension Father   . Diabetes Father   . Heart disease Father   . Stroke Father   . Cancer Maternal Grandmother        Colon Cancer  . Alzheimer's disease Maternal Grandmother   . Colon cancer Maternal Grandmother   . Heart disease Brother 40       Myocardial Infarction  . Breast cancer Paternal Aunt   . Esophageal cancer Neg Hx   . Stomach cancer Neg Hx   . Rectal cancer Neg Hx     ALLERGIES:  is allergic to other and sulfa antibiotics.  MEDICATIONS:  Current Outpatient Medications  Medication Sig Dispense Refill  . aspirin 81 MG chewable tablet Chew 81 mg by mouth daily.     . cetirizine (ZYRTEC) 10 MG tablet Take 10 mg by mouth daily.    . Cholecalciferol (PA VITAMIN D-3 GUMMY PO) Chew one (1) gummy by mouth daily.    Marland Kitchen dicyclomine (BENTYL) 20 MG tablet TAKE 1 TABLET(20 MG) BY MOUTH FOUR TIMES DAILY BEFORE MEALS AND AT BEDTIME 120 tablet 11  . empagliflozin (JARDIANCE) 25 MG TABS tablet Take 25 mg by mouth daily.    . Insulin Glargine (BASAGLAR KWIKPEN) 100 UNIT/ML Inject 34 Units into the skin daily.    Marland Kitchen liraglutide (VICTOZA) 18 MG/3ML SOPN Inject 0.8 mg into the skin every morning.    . lisdexamfetamine (VYVANSE) 20 MG capsule Take 20 mg by mouth daily.    Marland Kitchen lisinopril (ZESTRIL) 10 MG tablet Take 1 tablet (10 mg total) by mouth daily. 90 tablet 3  . Loperamide HCl (IMODIUM PO) Take 1-2 tablets by mouth daily.    . Multiple Vitamins-Minerals (ALIVE WOMENS GUMMY PO) Chew one (1) gummy by mouth daily.    . nitroGLYCERIN (NITROSTAT) 0.4 MG SL tablet Place 1 tablet (0.4 mg total) under the  tongue every 5 (five) minutes as needed for chest pain. 30 tablet 12  . oxyCODONE (OXY IR/ROXICODONE) 5 MG immediate release tablet Take 1 tablet (5 mg total) by mouth every 6 (six) hours as needed for moderate pain or severe pain. 25 tablet 0  . rosuvastatin (CRESTOR) 20 MG tablet Take 20 mg by mouth daily.    . tamoxifen (NOLVADEX) 20 MG tablet Take 1 tablet (20 mg total) by mouth daily. 90 tablet 3   Current Facility-Administered Medications  Medication Dose Route Frequency Provider Last Rate Last Admin  .  0.9 %  sodium chloride infusion  500 mL Intravenous Once Ladene Artist, MD        REVIEW OF SYSTEMS:   Constitutional: Denies fevers, chills or abnormal night sweats Eyes: Denies blurriness of vision, double vision or watery eyes Ears, nose, mouth, throat, and face: Denies mucositis or sore throat Respiratory: Denies cough, dyspnea or wheezes Cardiovascular: Denies palpitation, chest discomfort or lower extremity swelling Gastrointestinal:  Denies nausea, heartburn or change in bowel habits Skin: Denies abnormal skin rashes Lymphatics: Denies new lymphadenopathy or easy bruising Neurological:Denies numbness, tingling or new weaknesses Behavioral/Psych: Mood is stable, no new changes  Breast: Denies any palpable lumps or discharge All other systems were reviewed with the patient and are negative.  PHYSICAL EXAMINATION: ECOG PERFORMANCE STATUS: 1 - Symptomatic but completely ambulatory  Vitals:   07/03/19 1254  BP: 123/82  Pulse: (!) 101  Resp: 18  Temp: 98.7 F (37.1 C)  SpO2: 96%   Filed Weights   07/03/19 1254  Weight: 152 lb 1.6 oz (69 kg)    GENERAL:alert, no distress and comfortable SKIN: skin color, texture, turgor are normal, no rashes or significant lesions EYES: normal, conjunctiva are pink and non-injected, sclera clear OROPHARYNX:no exudate, no erythema and lips, buccal mucosa, and tongue normal  NECK: supple, thyroid normal size, non-tender, without  nodularity LYMPH:  no palpable lymphadenopathy in the cervical, axillary or inguinal LUNGS: clear to auscultation and percussion with normal breathing effort HEART: regular rate & rhythm and no murmurs and no lower extremity edema ABDOMEN:abdomen soft, non-tender and normal bowel sounds Musculoskeletal:no cyanosis of digits and no clubbing  PSYCH: alert & oriented x 3 with fluent speech NEURO: no focal motor/sensory deficits  LABORATORY DATA:  I have reviewed the data as listed Lab Results  Component Value Date   WBC 9.8 04/12/2016   HGB 16.0 (H) 04/12/2016   HCT 45.8 04/12/2016   MCV 83.6 04/12/2016   PLT 257 04/12/2016   Lab Results  Component Value Date   NA 139 06/02/2019   K 3.4 (L) 06/02/2019   CL 101 06/02/2019   CO2 27 06/02/2019    RADIOGRAPHIC STUDIES: I have personally reviewed the radiological reports and agreed with the findings in the report.  ASSESSMENT AND PLAN:  Atypical ductal hyperplasia of right breast Right breast lumpectomy: 4:00 and 5:00 positions: Focal ADH with calcifications, fibrocystic change  Pathology counseling: I discussed the difference between atypical ductal hyperplasia, DCIS and invasive breast cancer. I explained to her that atypical ductal hyperplasia is characterized by a proliferation of uniform epithelial cells filling part, but not the entirety, of the involved duct. ADH is associated with an increased risk of both ipsilateral and contralateral breast cancer and thus provides evidence of underlying breast abnormalities that predispose to breast cancer.   Prognosis:Using the [employer], breast cancer risk: 10-year risk: 11% and lifetime risk 32%  I discussed the risks and benefits of tamoxifen therapy.  Tamoxifen counseling:We discussed the risks and benefits of tamoxifen. These include but not limited to insomnia, hot flashes, mood changes, vaginal dryness, and weight gain. Although rare, serious side effects including  endometrial cancer, risk of blood clots were also discussed. We strongly believe that the benefits far outweigh the risks. Patient understands these risks and consented to starting treatment. Planned treatment duration is 5 years.  Nonpharmacological risk reduction 1. Exercise 30 minutes daily 2. Weight loss 3. Increasing fruits and vegetables and less red meat  Surveillance plan: Annual mammograms, breast MRI and  breast exams Breast MRIs will be done January 2022.  41-month follow-up MyChart virtual visit to go over adverse effects to tamoxifen therapy. All questions were answered. The patient knows to call the clinic with any problems, questions or concerns.   Rulon Eisenmenger, MD, MPH 07/03/2019    I, Molly Dorshimer, am acting as scribe for Nicholas Lose, MD.  I have reviewed the above documentation for accuracy and completeness, and I agree with the above.

## 2019-07-03 ENCOUNTER — Other Ambulatory Visit: Payer: Self-pay

## 2019-07-03 ENCOUNTER — Inpatient Hospital Stay: Payer: Managed Care, Other (non HMO) | Attending: Hematology and Oncology | Admitting: Hematology and Oncology

## 2019-07-03 DIAGNOSIS — F988 Other specified behavioral and emotional disorders with onset usually occurring in childhood and adolescence: Secondary | ICD-10-CM | POA: Diagnosis not present

## 2019-07-03 DIAGNOSIS — F329 Major depressive disorder, single episode, unspecified: Secondary | ICD-10-CM | POA: Diagnosis not present

## 2019-07-03 DIAGNOSIS — I10 Essential (primary) hypertension: Secondary | ICD-10-CM | POA: Diagnosis not present

## 2019-07-03 DIAGNOSIS — E785 Hyperlipidemia, unspecified: Secondary | ICD-10-CM | POA: Diagnosis not present

## 2019-07-03 DIAGNOSIS — A630 Anogenital (venereal) warts: Secondary | ICD-10-CM | POA: Insufficient documentation

## 2019-07-03 DIAGNOSIS — G4730 Sleep apnea, unspecified: Secondary | ICD-10-CM | POA: Insufficient documentation

## 2019-07-03 DIAGNOSIS — N6091 Unspecified benign mammary dysplasia of right breast: Secondary | ICD-10-CM | POA: Insufficient documentation

## 2019-07-03 DIAGNOSIS — M8580 Other specified disorders of bone density and structure, unspecified site: Secondary | ICD-10-CM | POA: Diagnosis not present

## 2019-07-03 DIAGNOSIS — Z79899 Other long term (current) drug therapy: Secondary | ICD-10-CM | POA: Diagnosis not present

## 2019-07-03 DIAGNOSIS — Z794 Long term (current) use of insulin: Secondary | ICD-10-CM | POA: Insufficient documentation

## 2019-07-03 DIAGNOSIS — Z7982 Long term (current) use of aspirin: Secondary | ICD-10-CM | POA: Diagnosis not present

## 2019-07-03 MED ORDER — TAMOXIFEN CITRATE 20 MG PO TABS
20.0000 mg | ORAL_TABLET | Freq: Every day | ORAL | 3 refills | Status: DC
Start: 2019-07-03 — End: 2020-03-30

## 2019-07-03 NOTE — Assessment & Plan Note (Signed)
Right breast lumpectomy: 4:00 and 5:00 positions: Focal ADH with calcifications, fibrocystic change  Pathology counseling: I discussed the difference between atypical ductal hyperplasia, DCIS and invasive breast cancer. I explained to her that atypical ductal hyperplasia is characterized by a proliferation of uniform epithelial cells filling part, but not the entirety, of the involved duct. ADH is associated with an increased risk of both ipsilateral and contralateral breast cancer and thus provides evidence of underlying breast abnormalities that predispose to breast cancer.   Prognosis:Using the American Cancer Society breast cancer risk assessment tool, her risk of breast cancer in 5 years is at 1.1% ( average woman's risk is 0.6%), her lifetime risk of breast cancers at 15.4% ( average risk is a 10%)  I discussed the risks and benefits of tamoxifen therapy.  Tamoxifen counseling:We discussed the risks and benefits of tamoxifen. These include but not limited to insomnia, hot flashes, mood changes, vaginal dryness, and weight gain. Although rare, serious side effects including endometrial cancer, risk of blood clots were also discussed. We strongly believe that the benefits far outweigh the risks. Patient understands these risks and consented to starting treatment. Planned treatment duration is 5 years.  Nonpharmacological risk reduction 1. Exercise 30 minutes daily 2. Weight loss 3. Increasing fruits and vegetables and less red meat  Surveillance plan: Annual mammograms, breast MRI and breast exams

## 2019-07-17 ENCOUNTER — Telehealth: Payer: Self-pay | Admitting: Hematology and Oncology

## 2019-07-17 NOTE — Telephone Encounter (Signed)
Scheduled per 06/03 los, patient has been called and notified.

## 2019-08-28 ENCOUNTER — Other Ambulatory Visit: Payer: Self-pay | Admitting: Gastroenterology

## 2019-10-01 NOTE — Progress Notes (Signed)
HEMATOLOGY-ONCOLOGY [facility] VIDEO VISIT PROGRESS NOTE  I connected with Lindsay Rios on 10/02/2019 at  2:00 PM EDT by MyChart video conference and verified that I am speaking with the correct person using two identifiers.  I discussed the limitations, risks, security and privacy concerns of performing an evaluation and management service by MyChart and the availability of in person appointments.  I also discussed with the patient that there may be a patient responsible charge related to this service. The patient expressed understanding and agreed to proceed.  Patient's Location: Home Physician Location: Clinic  CHIEF COMPLIANT: Follow-up for high risk for breast cancer   INTERVAL HISTORY: Lindsay Rios is a 54 y.o. female with above-mentioned history of high risk for breast cancer who is currently on risk reduction therapy with tamoxifen. She presents to the clinic today for follow-up.    Observations/Objective:  Today's Vitals   10/02/19 1432  BP: 131/86  Pulse: (!) 104  Resp: 18  Temp: 97.9 F (36.6 C)  SpO2: 96%  Weight: 147 lb 14.4 oz (67.1 kg)  Height: 5' 2.5" (1.588 m)   Body mass index is 26.62 kg/m.  I have reviewed the data as listed CMP Latest Ref Rng & Units 06/02/2019 04/12/2016 06/07/2015  Glucose 70 - 99 mg/dL 103(H) 189(H) 161(H)  BUN 6 - 20 mg/dL 12 18 13   Creatinine 0.44 - 1.00 mg/dL 0.74 0.86 0.69  Sodium 135 - 145 mmol/L 139 135 137  Potassium 3.5 - 5.1 mmol/L 3.4(L) 3.0(L) 3.7  Chloride 98 - 111 mmol/L 101 97(L) 100  CO2 22 - 32 mmol/L 27 24 30   Calcium 8.9 - 10.3 mg/dL 9.8 9.4 9.3  Total Protein 6.0 - 8.3 g/dL - - 7.2  Total Bilirubin 0.2 - 1.2 mg/dL - - 0.6  Alkaline Phos 39 - 117 U/L - - 73  AST 0 - 37 U/L - - 93(H)  ALT 0 - 35 U/L - - 129(H)    Lab Results  Component Value Date   WBC 9.8 04/12/2016   HGB 16.0 (H) 04/12/2016   HCT 45.8 04/12/2016   MCV 83.6 04/12/2016   PLT 257 04/12/2016   NEUTROABS 6.4 08/27/2014        Assessment Plan:  Atypical ductal hyperplasia of right breast Right breast lumpectomy: 4:00 and 5:00 positions: Focal ADH with calcifications, fibrocystic change Prognosis:Using the [employer] calculator, breast cancer risk: 10-year risk: 11% and lifetime risk 32%  Current treatment: Tamoxifen 20 mg daily started 07/03/2019 Tamoxifen toxicities: Tolerating tamoxifen extremely well without any problems or concerns.  Breast cancer surveillance: Annual mammograms, breast MRIs Breast MRIs will be done January 2022 Patient will inform me if the cost of the breast MRI is too expensive for her insurance.  If that is the case then we will stick with mammograms and breast exams and follow-up.  Patient stays very busy helping her mother and father get to appointments and handling their health issues. Return to clinic in 1 year for follow-up    I discussed the assessment and treatment plan with the patient. The patient was provided an opportunity to ask questions and all were answered. The patient agreed with the plan and demonstrated an understanding of the instructions. The patient was advised to call back or seek an in-person evaluation if the symptoms worsen or if the condition fails to improve as anticipated.   I provided 20 minutes of face-to-face MyChart video visit time during this encounter.    Rulon Eisenmenger, MD 10/02/2019  I, Molly Dorshimer, am acting as scribe for Nicholas Lose, MD.  I have reviewed the above documentation for accuracy and completeness, and I agree with the above.

## 2019-10-02 ENCOUNTER — Other Ambulatory Visit: Payer: Self-pay

## 2019-10-02 ENCOUNTER — Inpatient Hospital Stay: Payer: Managed Care, Other (non HMO) | Attending: Hematology and Oncology | Admitting: Hematology and Oncology

## 2019-10-02 DIAGNOSIS — N6091 Unspecified benign mammary dysplasia of right breast: Secondary | ICD-10-CM | POA: Diagnosis not present

## 2019-10-02 NOTE — Assessment & Plan Note (Signed)
Right breast lumpectomy: 4:00 and 5:00 positions: Focal ADH with calcifications, fibrocystic change Prognosis:Using the [employer] calculator, breast cancer risk: 10-year risk: 11% and lifetime risk 32%  Current treatment: Tamoxifen 20 mg daily started 07/03/2019 Tamoxifen toxicities:  Breast cancer surveillance: Annual mammograms, breast MRIs Breast MRIs will be done January 2022  Return to clinic in 1 year for follow-up

## 2019-10-07 ENCOUNTER — Telehealth: Payer: Self-pay | Admitting: Hematology and Oncology

## 2019-10-07 NOTE — Telephone Encounter (Signed)
Scheduled per 9/2 los. Called and spoke with pt confirmed 9/1 appt

## 2019-10-21 ENCOUNTER — Telehealth: Payer: Self-pay | Admitting: Gastroenterology

## 2019-10-21 MED ORDER — DICYCLOMINE HCL 20 MG PO TABS: ORAL_TABLET | ORAL | 1 refills | Status: DC

## 2019-10-21 NOTE — Telephone Encounter (Signed)
Informed patient that I will send a refill to her pharmacy but she is due for follow up. Patient scheduled appt 12/16/19 and refill sent to her pharmacy.

## 2019-12-16 ENCOUNTER — Ambulatory Visit: Payer: Managed Care, Other (non HMO) | Admitting: Gastroenterology

## 2019-12-16 ENCOUNTER — Encounter: Payer: Self-pay | Admitting: Gastroenterology

## 2019-12-16 VITALS — BP 138/80 | HR 104 | Ht 62.5 in | Wt 148.0 lb

## 2019-12-16 DIAGNOSIS — K591 Functional diarrhea: Secondary | ICD-10-CM

## 2019-12-16 DIAGNOSIS — R152 Fecal urgency: Secondary | ICD-10-CM | POA: Diagnosis not present

## 2019-12-16 MED ORDER — GLYCOPYRROLATE 2 MG PO TABS
2.0000 mg | ORAL_TABLET | Freq: Two times a day (BID) | ORAL | 3 refills | Status: DC
Start: 2019-12-16 — End: 2022-07-12

## 2019-12-16 NOTE — Patient Instructions (Addendum)
Stop taking the dicyclomine.   We have sent the following medications to your pharmacy for you to pick up at your convenience: glycopyrrolate.   You have been given a low fodmap diet.  Thank you for choosing me and [facility].  Pricilla Riffle. Dagoberto Ligas., MD., Marval Regal

## 2019-12-16 NOTE — Progress Notes (Signed)
    History of Present Illness: This is a 54 year old female with functional diarrhea and pelvic floor dysfunction.  Colonoscopy to [facility] in 03/2017 was normal.  She has multiple bowel movements per day often following meals except in the morning when she tends to have a solid bowel movement and then several loose bowel movements  before breakfast.  She feels dicyclomine has been helpful as has Imodium however symptoms persist at a lower level.   Current Medications, Allergies, Past Medical History, Past Surgical History, Family History and Social History were reviewed in [employer] record.   Physical Exam: General: Well developed, well nourished, no acute distress Head: Normocephalic and atraumatic Eyes:  sclerae anicteric, EOMI Ears: Normal auditory acuity Mouth: Not examined, mask on during Covid-19 pandemic Lungs: Clear throughout to auscultation Heart: Regular rate and rhythm; no murmurs, rubs or bruits Abdomen: Soft, non tender and non distended. No masses, hepatosplenomegaly or hernias noted. Normal Bowel sounds Rectal: Not done Musculoskeletal: Symmetrical with no gross deformities  Pulses:  Normal pulses noted Extremities: No clubbing, cyanosis, edema or deformities noted Neurological: Alert oriented x 4, grossly nonfocal Psychological:  Alert and cooperative. Normal mood and affect   Assessment and Recommendations:  1. Functional diarrhea, fecal urgency.  Trial of a low FODMAP diet.  Avoid foods and beverages that worsen symptoms.  Change to glycopyrrolate 2 mg p.o. twice daily.  Continue Imodium 1-2 daily as needed.  If she feels dicyclomine was more effective than glycopyrrolate she is advised to call and we will change back to dicyclomine. REV in 1 year.

## 2020-01-18 ENCOUNTER — Ambulatory Visit
Admission: RE | Admit: 2020-01-18 | Discharge: 2020-01-18 | Disposition: A | Discharge status: Home / Self Care | Payer: Managed Care, Other (non HMO) | Source: Ambulatory Visit | Attending: Hematology and Oncology | Admitting: Hematology and Oncology

## 2020-01-18 DIAGNOSIS — N6091 Unspecified benign mammary dysplasia of right breast: Secondary | ICD-10-CM

## 2020-01-18 IMAGING — MR MR BREAST BILAT WO/W CM
8 of 12 series · 32 of 48 positions shown · IV contrast (7 ML GADAVIST)
Comparison: None.

CLINICAL DATA: High risk screening MRI. Recent right breast
excisional biopsy for ADH. No current problems.

LABS:  None.
EXAM:
BILATERAL BREAST MRI WITH AND WITHOUT CONTRAST
TECHNIQUE: Multiplanar, multisequence MR images of both breasts were obtained
prior to and following the intravenous administration of 7 ml of
Gadavist

[Series 2: t2_tirm_tra ipat (a-p) · axial · 3.0mm · 0.66mm/px · 1 of 55 slices shown]
[im 1/55]
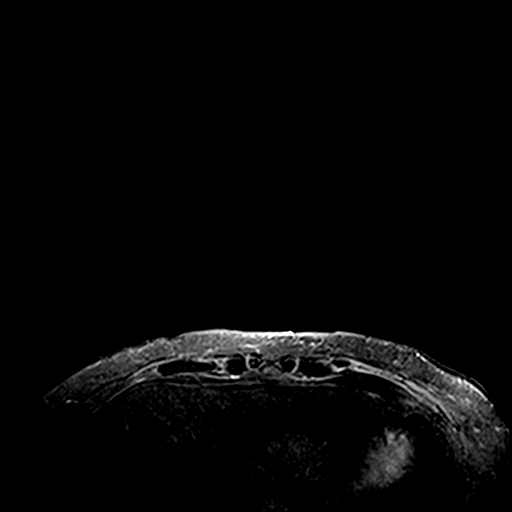

[Series 3: fl3d pre-cm no · axial · non-contrast · 1.2mm · 0.89mm/px · z∈[-56,+116]mm · 5 of 144 slices shown]
[im 1/144]
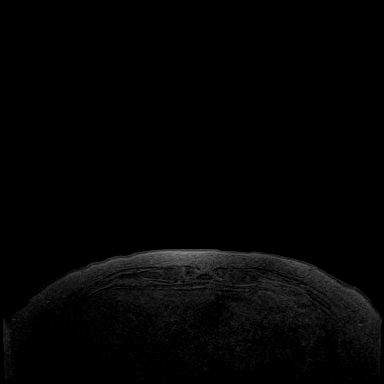
[im 36/144]
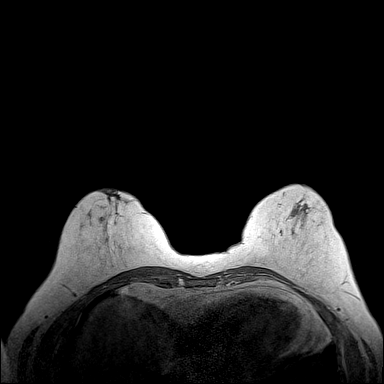
[im 72/144]
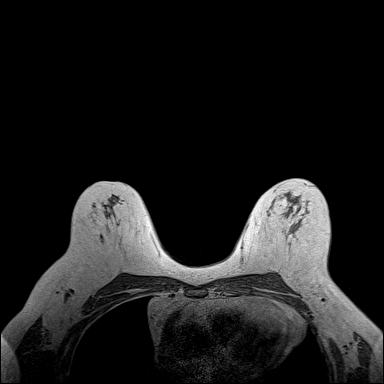
[im 108/144]
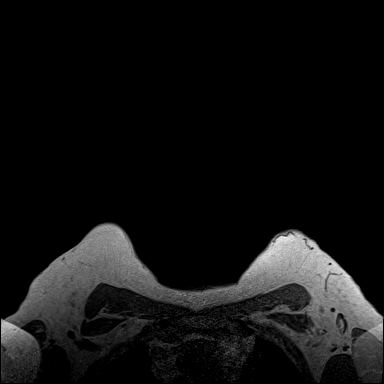
[im 144/144]
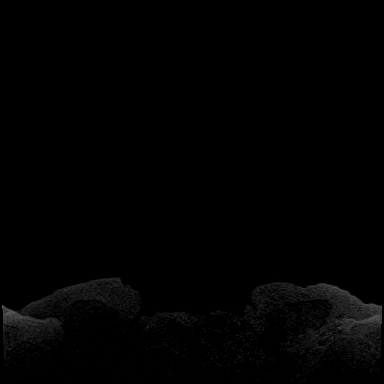

[Series 4: fl3d pre-cm · axial · non-contrast · 1.2mm · 0.89mm/px · z∈[-56,+116]mm · 5 of 144 slices shown]
[im 1/144]
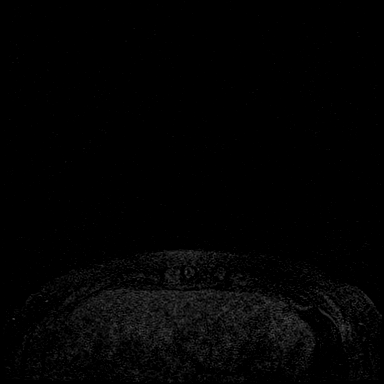
[im 36/144]
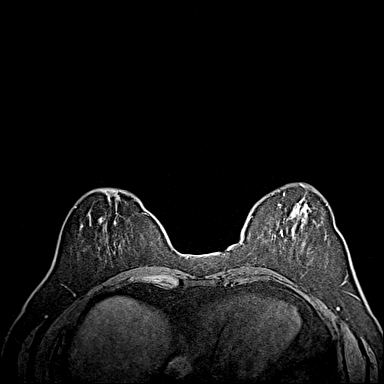
[im 72/144]
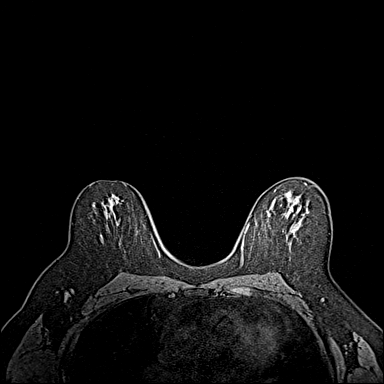
[im 108/144]
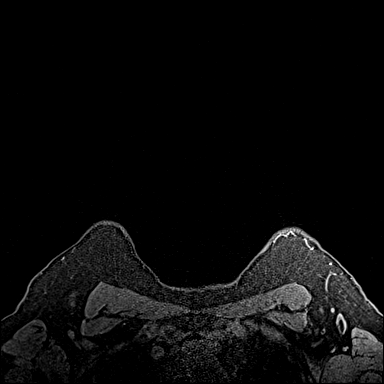
[im 144/144]
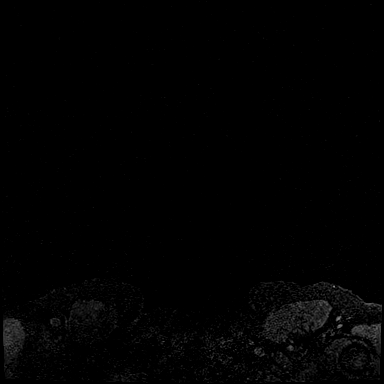

[Series 5: fl3d post immediate · axial · 1.2mm · 0.89mm/px · z∈[-56,+116]mm · 5 of 144 slices shown (1 of 3)]
[im 1/144]
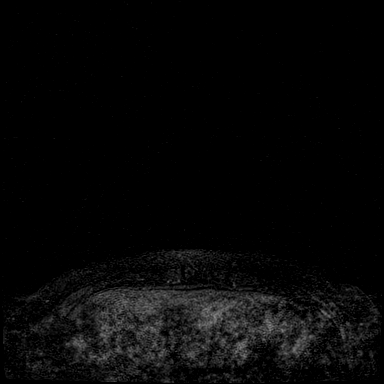
[im 36/144]
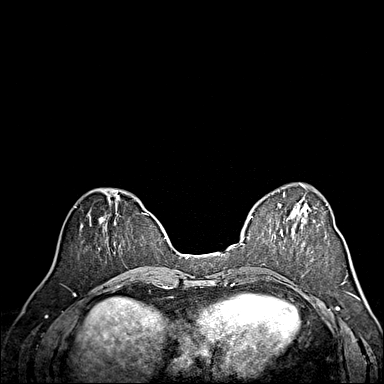
[im 72/144]
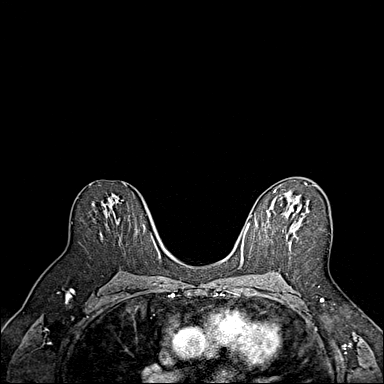
[im 108/144]
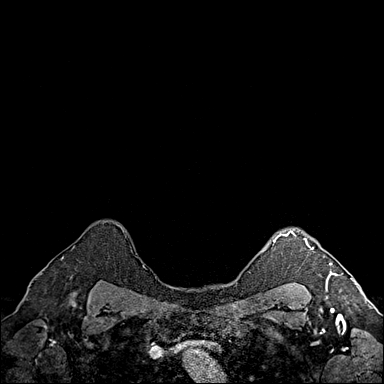
[im 144/144]
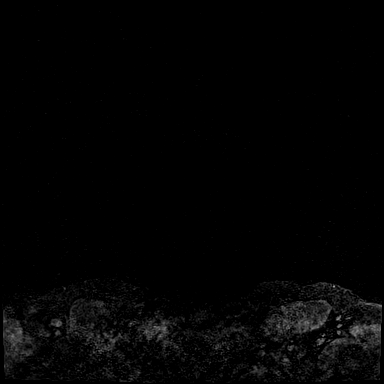

[Series 6: fl3d post immediate · axial · 1.2mm · 0.89mm/px · z∈[-56,+116]mm · 5 of 144 slices shown (2 of 3)]
[im 1/144]
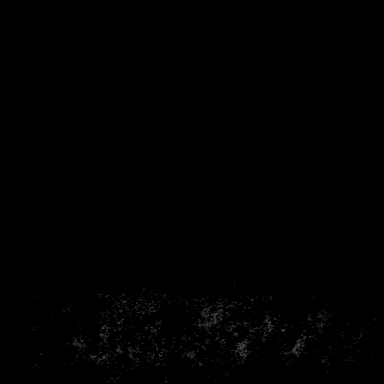
[im 36/144]
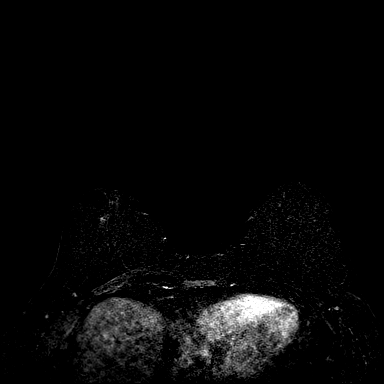
[im 72/144]
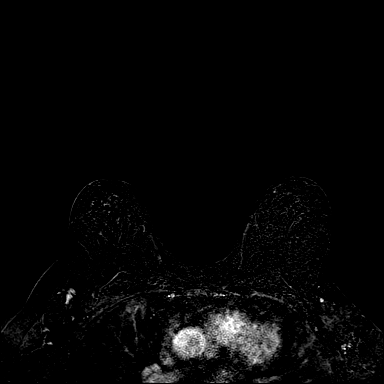
[im 108/144]
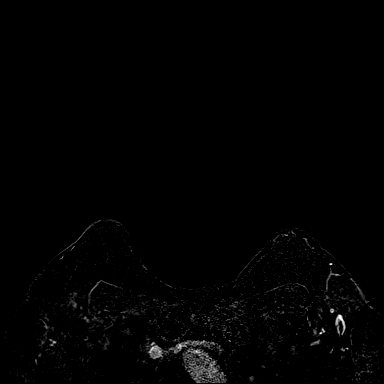
[im 144/144]
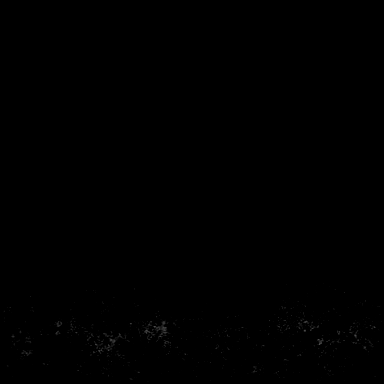

[Series 7: fl3d post immediate · axial · 172.8mm · 0.89mm/px · 1 of 1 slices shown (3 of 3)]
[im 1/1]
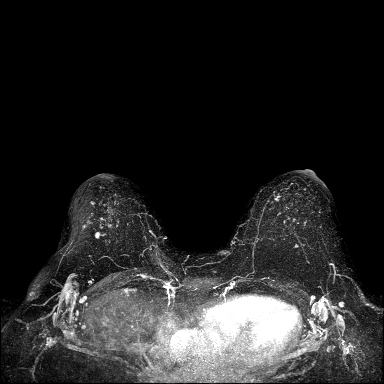

[Series 8: fl3d post 3min · axial · 1.2mm · 0.89mm/px · z∈[-56,+116]mm · 6 of 144 slices shown]
[im 1/144]
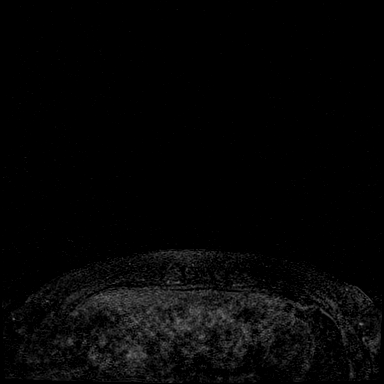
[im 29/144]
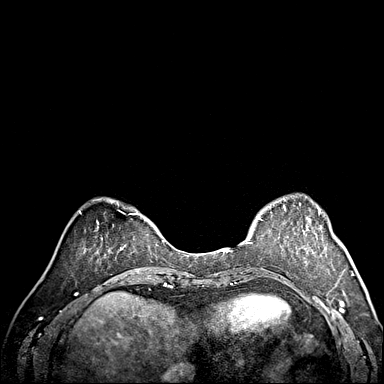
[im 58/144]
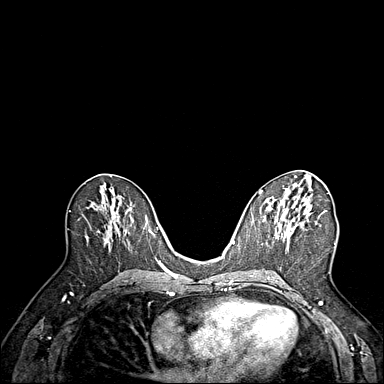
[im 86/144]
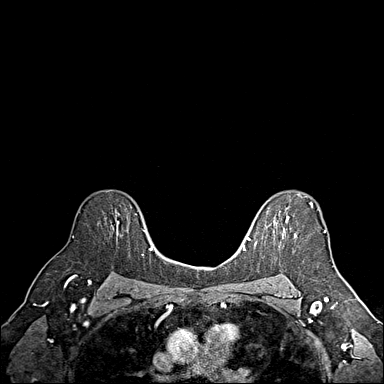
[im 115/144]
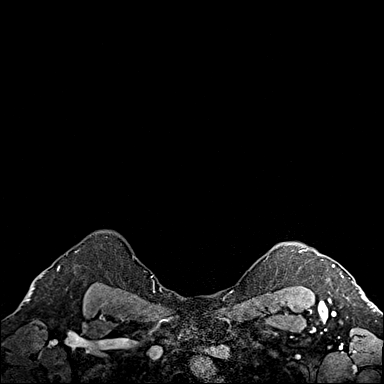
[im 144/144]
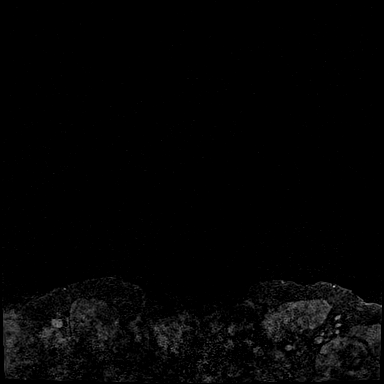

[Series 9: fl3d post 3min_sub · axial · 1.2mm · 0.89mm/px · z∈[-56,+46]mm · 4 of 144 slices shown]
[im 1/144]
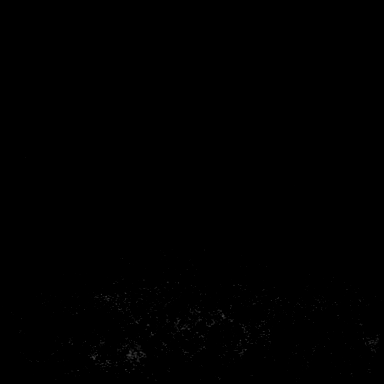
[im 29/144]
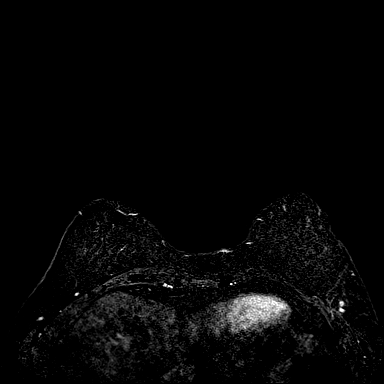
[im 58/144]
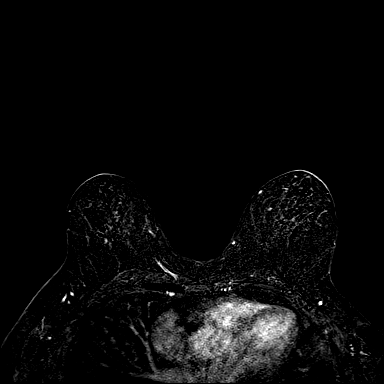
[im 86/144]
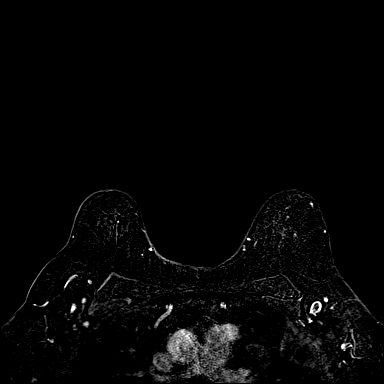

[32 of 48 positions shown; findings below may reference images not displayed]

Three-dimensional MR images were rendered by post-processing of the
original MR data on an independent workstation. The
three-dimensional MR images were interpreted, and findings are
reported in the following complete MRI report for this study. Three
dimensional images were evaluated at the independent interpreting
workstation using the DynaCAD thin client.
FINDINGS: Breast composition: c. Heterogeneous fibroglandular tissue.

Background parenchymal enhancement: Mild

Right breast: There is a focus of susceptibility artifact in the
central right breast middle depth which likely corresponds to a
biopsy marking clip (series 3, image 103). There are multiple
clumped small masses in the outer and lower outer right breast
overall spanning approximately 3.4 x 2.9 cm (series 6, image 92).
The largest mass located at the posterior extent measures 0.6 cm
(series 6, image 93). There is a small mass at the anterior extent
measuring 0.4 cm (series 6, image 92). Postsurgical changes are
noted in the lower slightly medial aspect of the breast. No
additional abnormal areas of enhancement in the right breast.
Enhancement

Left breast: No mass or abnormal enhancement.

Lymph nodes: No abnormal appearing lymph nodes.

Ancillary findings:  None.
IMPRESSION: 1. Multiple clumped small masses in the outer and lower outer right
breast overall spanning approximately 3.4 x 2.9 cm. The largest
single mass located at the posterior extent measures 0.6 cm.
2. No MRI evidence of malignancy in the left breast.

RECOMMENDATION:
Recommend MRI guided biopsy x 2 at the anterior and posterior extent
of the clumped masses in the right breast.

BI-RADS CATEGORY  4: Suspicious.

## 2020-01-18 MED ORDER — GADOBUTROL 1 MMOL/ML IV SOLN
7.0000 mL | Freq: Once | INTRAVENOUS | Status: AC | PRN
  Administered 2020-01-18: 7 mL via INTRAVENOUS

## 2020-01-20 ENCOUNTER — Other Ambulatory Visit: Payer: Self-pay | Admitting: Hematology and Oncology

## 2020-01-20 ENCOUNTER — Telehealth: Payer: Self-pay | Admitting: *Deleted

## 2020-01-20 DIAGNOSIS — R9389 Abnormal findings on diagnostic imaging of other specified body structures: Secondary | ICD-10-CM

## 2020-01-20 NOTE — Telephone Encounter (Signed)
Called pt with MRI results and recommendations of MR bx. Received verbal understanding.

## 2020-01-22 ENCOUNTER — Other Ambulatory Visit: Payer: Self-pay

## 2020-01-22 ENCOUNTER — Ambulatory Visit
Admission: RE | Admit: 2020-01-22 | Discharge: 2020-01-22 | Disposition: A | Discharge status: Home / Self Care | Payer: Managed Care, Other (non HMO) | Source: Ambulatory Visit | Attending: Hematology and Oncology | Admitting: Hematology and Oncology

## 2020-01-22 ENCOUNTER — Other Ambulatory Visit (HOSPITAL_COMMUNITY): Payer: Self-pay | Admitting: Diagnostic Radiology

## 2020-01-22 DIAGNOSIS — R9389 Abnormal findings on diagnostic imaging of other specified body structures: Secondary | ICD-10-CM

## 2020-01-22 IMAGING — MG MM BREAST LOCALIZATION CLIP
4 series · 4 of 12 positions shown · non-contrast
Comparison: Previous exam(s).

CLINICAL DATA: Confirmation of clip placement after MRI guided
biopsies of masses at the ANTERIOR and POSTERIOR extent of grouped
masses spanning 3.5 cm in the OUTER RIGHT breast.

EXAM:
2D AND TOMOSYNTHESIS DIAGNOSTIC RIGHT MAMMOGRAM POST MRI BIOPSY

[R CC synth-2D]
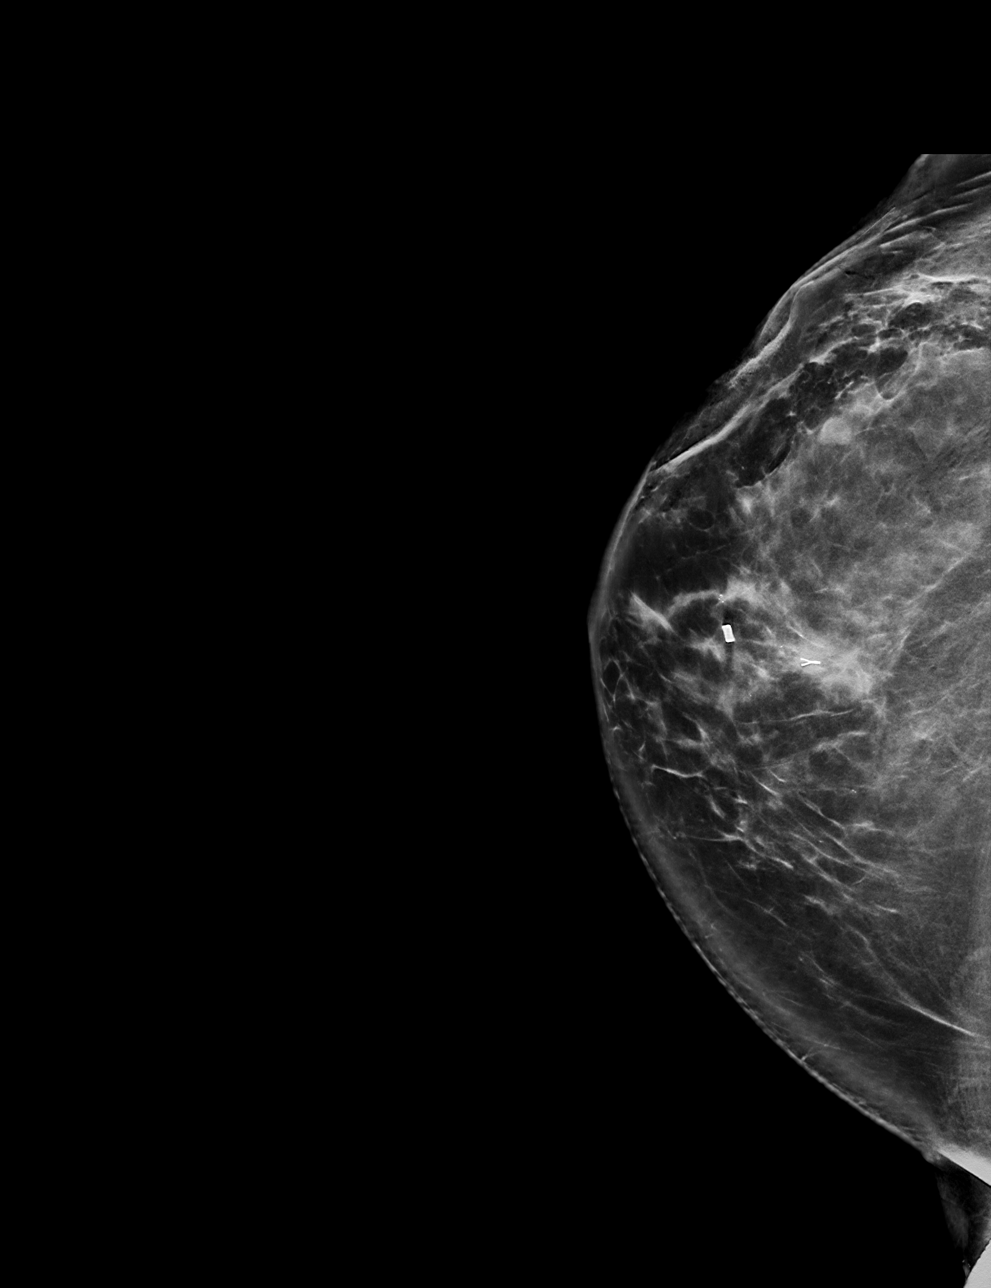

[R ML synth-2D]
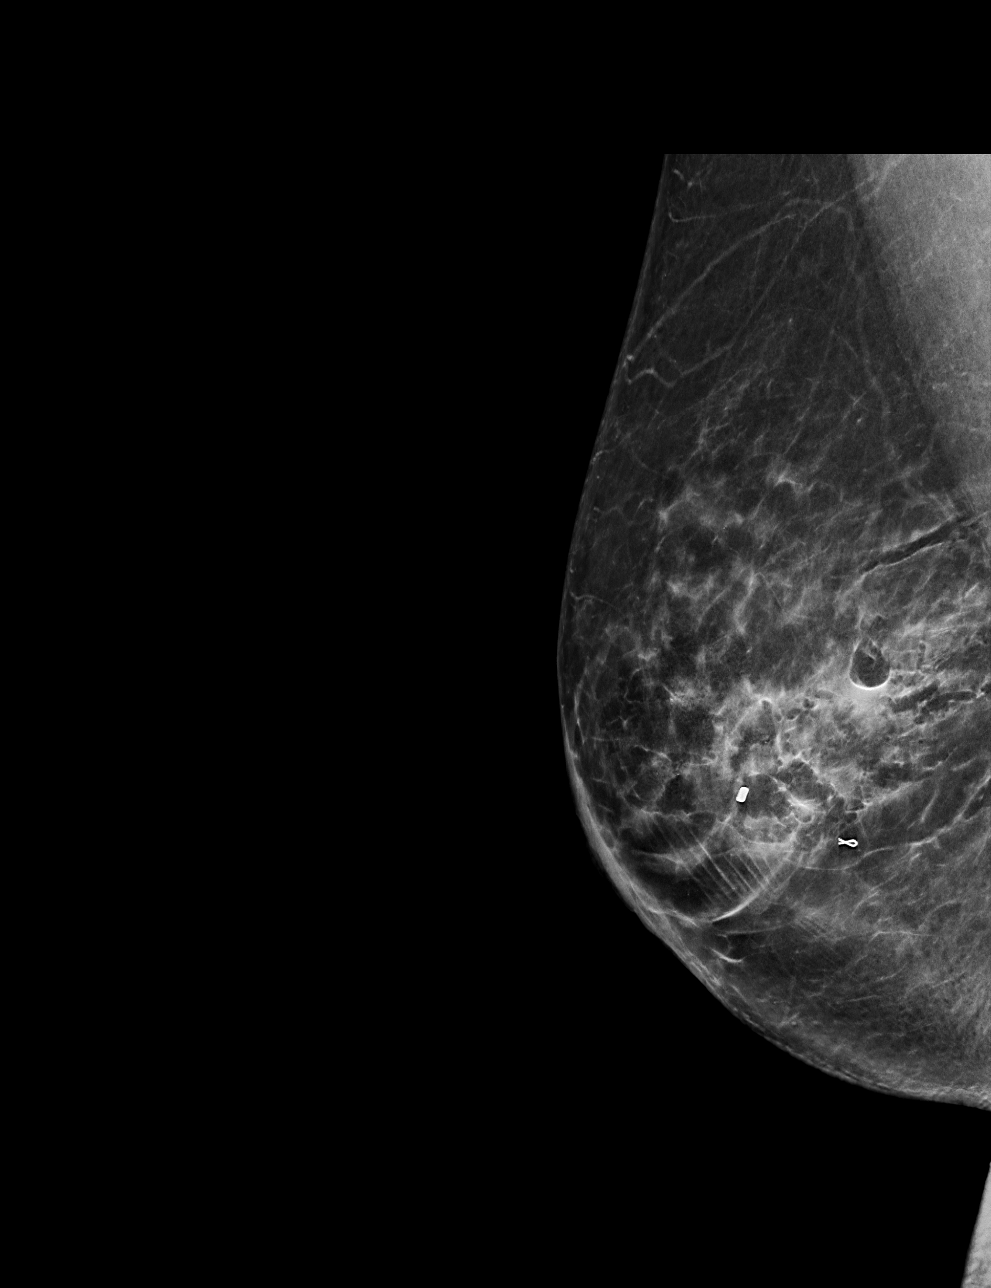

[R ML tomo · tomo slice 36/71.0]
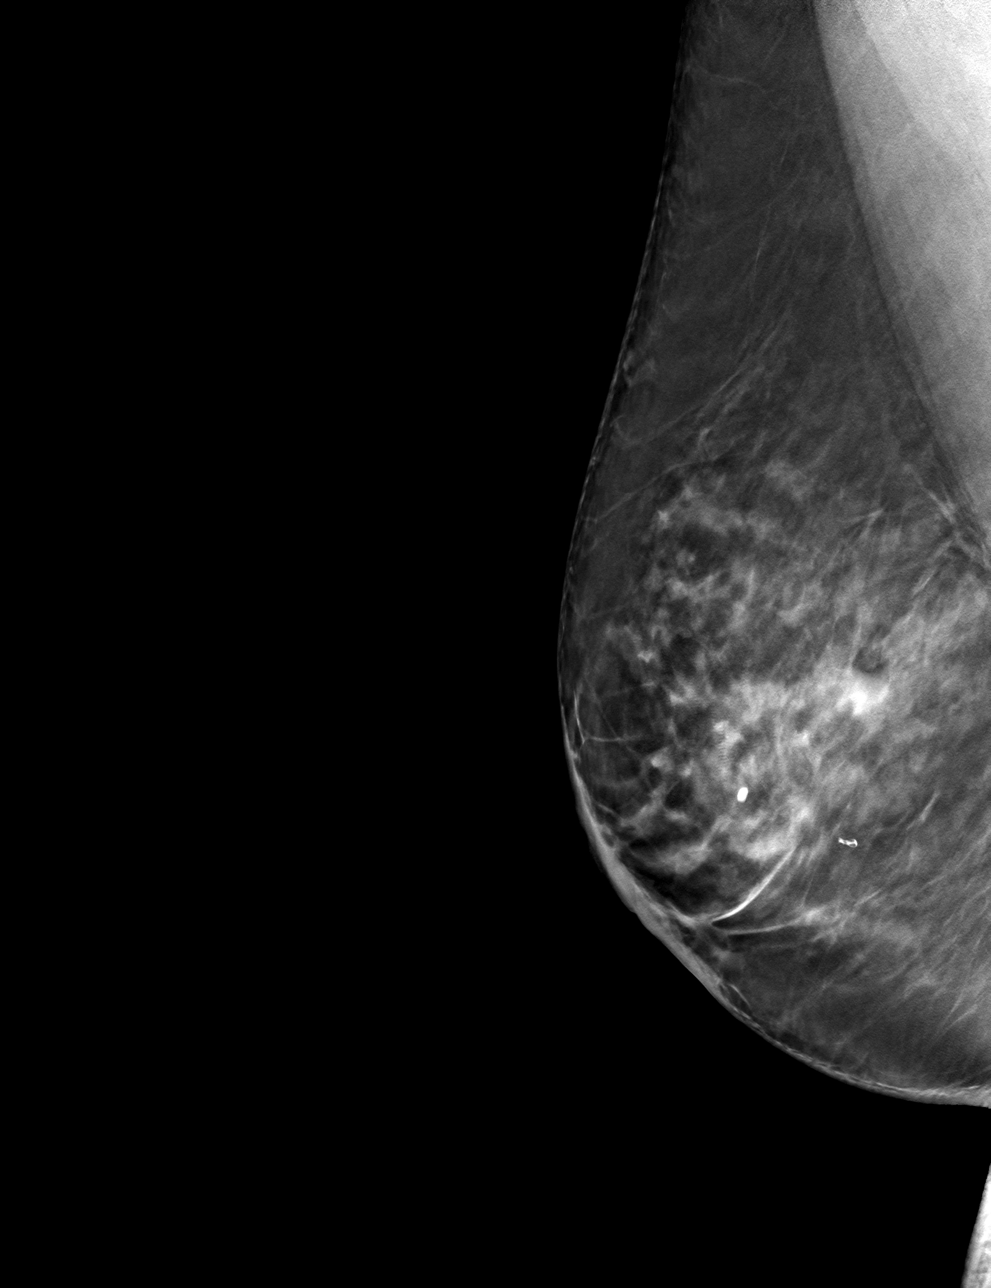

[R CC tomo · tomo slice 39/78.0]
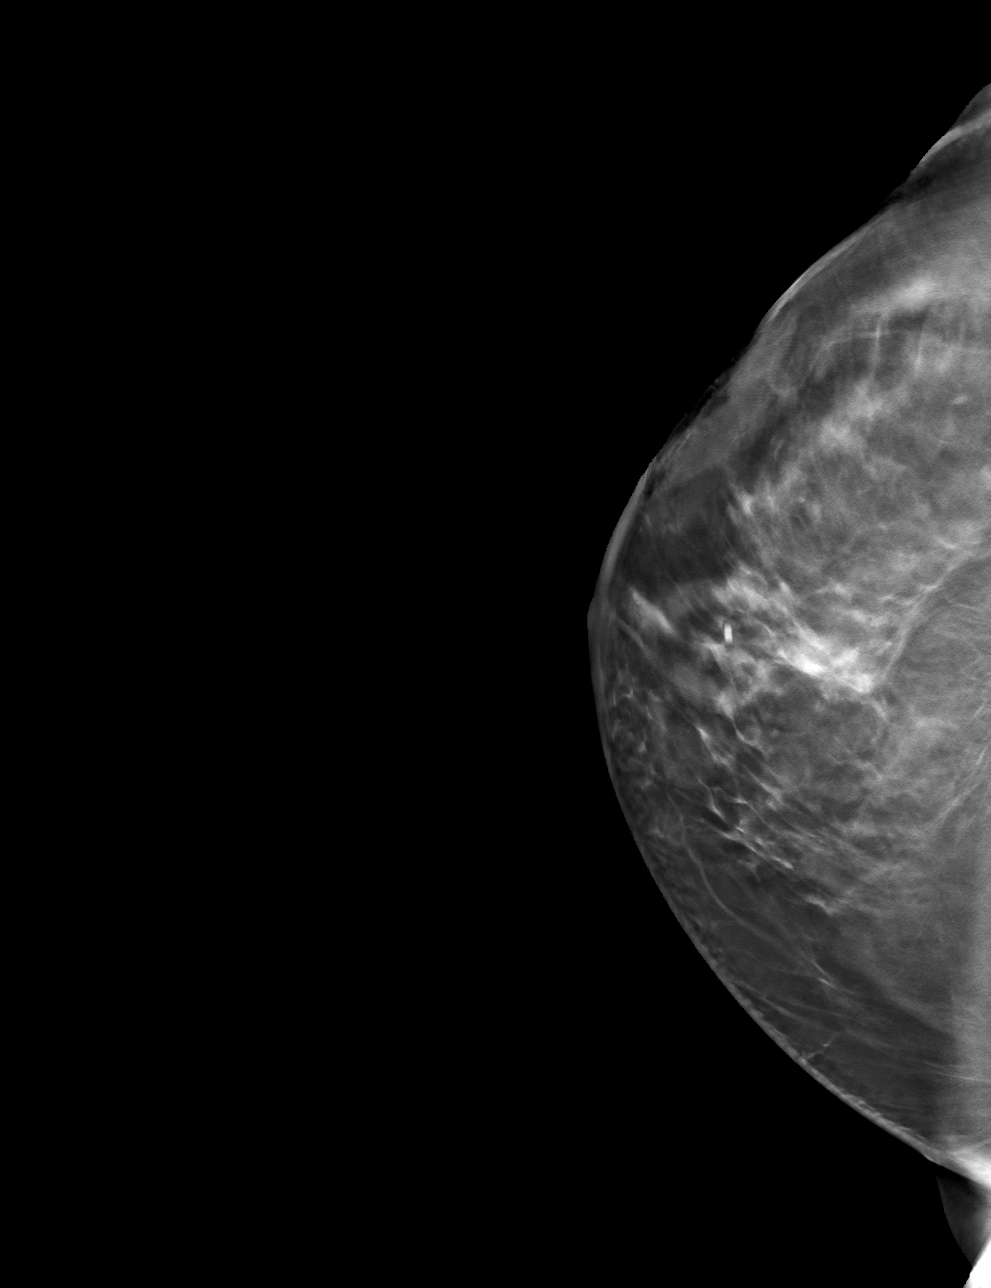

[4 of 12 positions shown; findings below may reference images not displayed]

FINDINGS: Tomosynthesis and synthesized full field CC and mediolateral images
were obtained following MRI guided biopsy of the ANTERIOR and
POSTERIOR extent of grouped masses in the OUTER RIGHT breast.

The cylinder shaped tissue marking clip is appropriately positioned
immediately adjacent to the biopsied mass at the ANTERIOR extent of
the grouped masses in the OUTER breast.

The barbell shaped tissue marker clip deployed at the site of the
biopsied mass at the POSTERIOR extent of the grouped masses in the
OUTER breast did not deploy. There is residual mass at this location
which can be used for localization purposes if needed.

The distance between the cylinder clip anteriorly and the residual
biopsied mass posteriorly is approximately 4.3 cm.
IMPRESSION: 1. Appropriate positioning of the cylinder shaped tissue marking
clip medially adjacent to the biopsied mass at the ANTERIOR extent
of the grouped masses in the OUTER RIGHT breast.
2. The barbell shaped tissue marker clip deployed at the site of the
biopsied mass at the POSTERIOR extent of the grouped masses in the
OUTER RIGHT breast did not deploy. There is residual mass at this
location should localization be necessary.
3. The distance between the cylinder clip anteriorly and the
residual biopsied mass posteriorly is approximately 4.3 cm.

Final Assessment: Post Procedure Mammograms for Marker Placement

## 2020-01-22 MED ORDER — GADOBUTROL 1 MMOL/ML IV SOLN
8.0000 mL | Freq: Once | INTRAVENOUS | Status: AC | PRN
  Administered 2020-01-22: 8 mL via INTRAVENOUS

## 2020-01-27 ENCOUNTER — Other Ambulatory Visit: Payer: Self-pay | Admitting: Hematology and Oncology

## 2020-01-27 DIAGNOSIS — N649 Disorder of breast, unspecified: Secondary | ICD-10-CM

## 2020-02-03 ENCOUNTER — Ambulatory Visit: Payer: Managed Care, Other (non HMO) | Admitting: Podiatry

## 2020-02-03 ENCOUNTER — Other Ambulatory Visit: Payer: Self-pay

## 2020-02-03 ENCOUNTER — Encounter: Payer: Self-pay | Admitting: Podiatry

## 2020-02-03 DIAGNOSIS — Z794 Long term (current) use of insulin: Secondary | ICD-10-CM

## 2020-02-03 DIAGNOSIS — E119 Type 2 diabetes mellitus without complications: Secondary | ICD-10-CM

## 2020-02-05 NOTE — Progress Notes (Signed)
Subjective:  Patient ID: Lindsay Rios, female    DOB: [birth year],  MRN: [id number]  55 y.o. female presents with preventative diabetic foot care and painful thick toenails that are difficult to trim. Pain interferes with ambulation. Aggravating factors include wearing enclosed shoe gear. Pain is relieved with periodic professional debridement..    Patient has been diabetic greater than 2 years. She voices no new pedal problems on today's visit.  PCP: [name], PA and last visit was: 01/12/2020.  Review of Systems: Negative except as noted in the HPI.  Past Medical History:  Diagnosis Date  . ADD (attention deficit disorder)   . Allergy   . Depression   . Ejection fraction   . Gestational diabetes 1999; 2002  . History of chicken pox   . 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 Hyperlipidemia   . Hypertension   . Numbness and tingling   . Osteopenia   . Pneumonia 2012  . Sleep apnea with use of continuous positive airway pressure (CPAP)   . Type II diabetes mellitus ([place])    "borderline; I'm on Metformin"   Past Surgical History:  Procedure Laterality Date  . ANAL RECTAL MANOMETRY N/A 03/17/2015   Procedure: ANO RECTAL MANOMETRY;  Surgeon: Mauri Pole, MD;  Location: [facility];  Service: Endoscopy;  Laterality: N/A;  . BREAST LUMPECTOMY WITH RADIOACTIVE SEED LOCALIZATION Right 06/05/2019   Procedure: [name];  Surgeon: Coralie Keens, MD;  Location: [place];  Service: General;  Laterality: Right;  . LEFT HEART CATHETERIZATION WITH CORONARY ANGIOGRAM N/A 12/22/2013   Procedure: LEFT HEART CATHETERIZATION WITH CORONARY ANGIOGRAM;  Surgeon: Jettie Booze, MD;  Location: [facility] CATH LAB;  Service: Cardiovascular;  Laterality: N/A;  . SHOULDER ARTHROSCOPY W/ ROTATOR CUFF REPAIR Right 1990's  . VAGINAL HYSTERECTOMY  2013   HPV/Cervical Dysplasia; partial   Patient Active Problem  List   Diagnosis Date Noted  . Atypical ductal hyperplasia of right breast 07/03/2019  . Chest pain radiating to jaw 04/12/2016  . OSA on CPAP 04/12/2016  . Family history of early CAD 04/12/2016  . Vitamin D deficiency 06/07/2015  . Ulnar neuropathy at elbow of left upper extremity 05/25/2015  . Constipation   . Spondylosis, cervical, with myelopathy 01/11/2015  . HTN (hypertension) 09/09/2014  . Plantar fasciitis, bilateral 09/09/2014  . HLD (hyperlipidemia) 02/22/2014  . DM type 2 (diabetes mellitus, type 2) ([place]) 02/09/2014  . Depression 06/14/2013  . Stress and adjustment reaction 06/14/2013  . Attention or concentration deficit 06/02/2012  . Allergic rhinitis 09/25/2006    Current Outpatient Medications:  .  aspirin 81 MG chewable tablet, Chew 81 mg by mouth daily. , Disp: , Rfl:  .  cetirizine (ZYRTEC) 10 MG tablet, Take 10 mg by mouth daily., Disp: , Rfl:  .  Cholecalciferol (PA VITAMIN D-3 GUMMY PO), Chew one (1) gummy by mouth daily., Disp: , Rfl:  .  empagliflozin (JARDIANCE) 25 MG TABS tablet, Take 25 mg by mouth daily., Disp: , Rfl:  .  glycopyrrolate (ROBINUL) 2 MG tablet, Take 1 tablet (2 mg total) by mouth 2 (two) times daily., Disp: 180 tablet, Rfl: 3 .  Insulin Glargine (BASAGLAR KWIKPEN) 100 UNIT/ML, Inject 34 Units into the skin daily., Disp: , Rfl:  .  liraglutide (VICTOZA) 18 MG/3ML SOPN, Inject 0.8 mg into the skin every morning., Disp: , Rfl:  .  lisdexamfetamine (VYVANSE) 20 MG capsule, Take 20 mg  by mouth daily., Disp: , Rfl:  .  lisinopril (ZESTRIL) 10 MG tablet, Take 1 tablet (10 mg total) by mouth daily., Disp: 90 tablet, Rfl: 3 .  Loperamide HCl (IMODIUM PO), Take 1-2 tablets by mouth daily., Disp: , Rfl:  .  Multiple Vitamins-Minerals (ALIVE WOMENS GUMMY PO), Chew one (1) gummy by mouth daily., Disp: , Rfl:  .  nitroGLYCERIN (NITROSTAT) 0.4 MG SL tablet, Place 1 tablet (0.4 mg total) under the tongue every 5 (five) minutes as needed for chest pain.,  Disp: 30 tablet, Rfl: 12 .  oxyCODONE (OXY IR/ROXICODONE) 5 MG immediate release tablet, Take 1 tablet (5 mg total) by mouth every 6 (six) hours as needed for moderate pain or severe pain., Disp: 25 tablet, Rfl: 0 .  rosuvastatin (CRESTOR) 20 MG tablet, Take 20 mg by mouth daily., Disp: , Rfl:  .  tamoxifen (NOLVADEX) 20 MG tablet, Take 1 tablet (20 mg total) by mouth daily., Disp: 90 tablet, Rfl: 3  Current Facility-Administered Medications:  .  0.9 %  sodium chloride infusion, 500 mL, Intravenous, Once, Meryl Dare, MD Allergies  Allergen Reactions  . Other Nausea Only and Other (See Comments)    REACTION: nausea   . Sulfa Antibiotics Nausea Only    REACTION: nausea   Social History   Tobacco Use  Smoking Status Never Smoker  Smokeless Tobacco Never Used    Objective:  There were no vitals filed for this visit. Constitutional Patient is a pleasant 55 y.o. Caucasian female in NAD. AAO x 3.  Vascular Capillary refill time to digits immediate b/l. Palpable pedal pulses b/l LE. Pedal hair present. Lower extremity skin temperature gradient within normal limits. No cyanosis or clubbing noted.  Neurologic Normal speech. Pt has subjective symptoms of neuropathy. Protective sensation intact 5/5 intact bilaterally with 10g monofilament b/l. Vibratory sensation intact b/l. Proprioception intact bilaterally.  Dermatologic Pedal skin with normal turgor, texture and tone bilaterally. No open wounds bilaterally. No interdigital macerations bilaterally.  Orthopedic: Normal muscle strength 5/5 to all lower extremity muscle groups bilaterally. No pain crepitus or joint limitation noted with ROM b/l. No gross bony deformities bilaterally. Patient ambulates independent of any assistive aids.     Assessment:   1. Type 2 diabetes mellitus without complication, with long-term current use of insulin (HCC)   2. Encounter for diabetic foot exam [facility])    Plan:  Patient was evaluated and treated and  all questions answered.  -Examined patient. -Diabetic foot examination performed on today's visit. -Continue diabetic foot care principles. -Patient to continue soft, supportive shoe gear daily. -Patient to report any pedal injuries to medical professional immediately. -Patient/POA to call should there be question/concern in the interim.  Return in about 3 months (around 05/03/2020).  Freddie Breech, DPM

## 2020-02-19 ENCOUNTER — Ambulatory Visit
Admission: RE | Admit: 2020-02-19 | Discharge: 2020-02-19 | Disposition: A | Discharge status: Home / Self Care | Payer: Managed Care, Other (non HMO) | Source: Ambulatory Visit | Attending: Hematology and Oncology | Admitting: Hematology and Oncology

## 2020-02-19 ENCOUNTER — Other Ambulatory Visit: Payer: Self-pay

## 2020-02-19 DIAGNOSIS — N649 Disorder of breast, unspecified: Secondary | ICD-10-CM

## 2020-02-19 IMAGING — MG DIGITAL DIAGNOSTIC BILAT W/ TOMO W/ CAD
8 of 14 series · 8 of 34 positions shown · non-contrast
Comparison: Previous exam(s).

CLINICAL DATA: Patient with multiple biopsies in the right breast
demonstrating ADH. Patient has had excision of two of the previous
biopsy sites which demonstrated ADH on surgical excision.
Additionally, there is a cylinder-shaped clip within the outer right
breast at a site of recent MRI guided core needle biopsy
demonstrating ADH.

EXAM:
DIGITAL DIAGNOSTIC BILATERAL MAMMOGRAM WITH TOMO AND CAD
TECHNIQUE: Bilateral digital diagnostic mammography and breast tomosynthesis
was performed. Digital images of the breasts were evaluated with
computer-aided detection.

[R CC (1 of 2)]
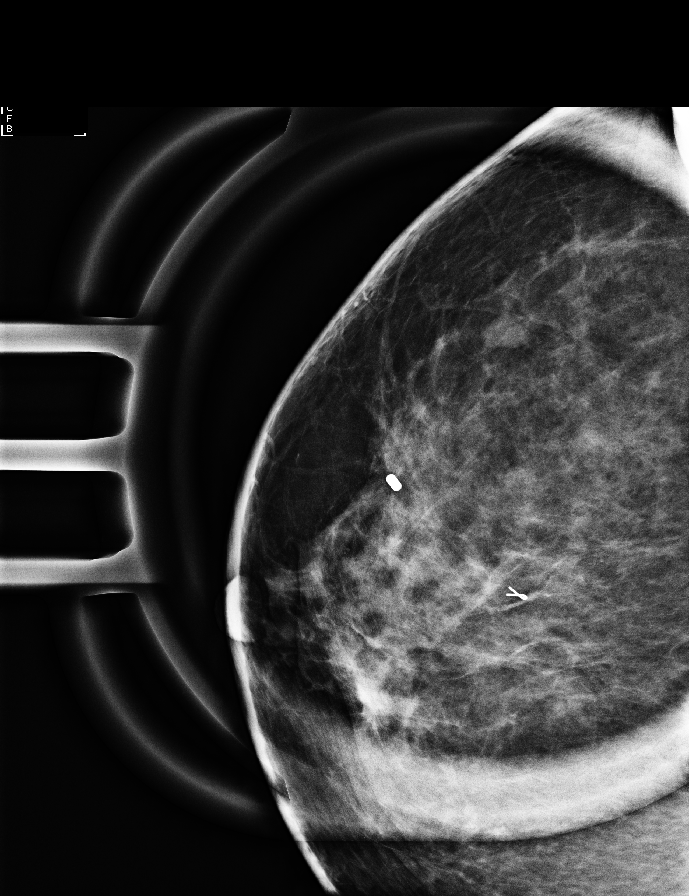

[R ML (1 of 2)]
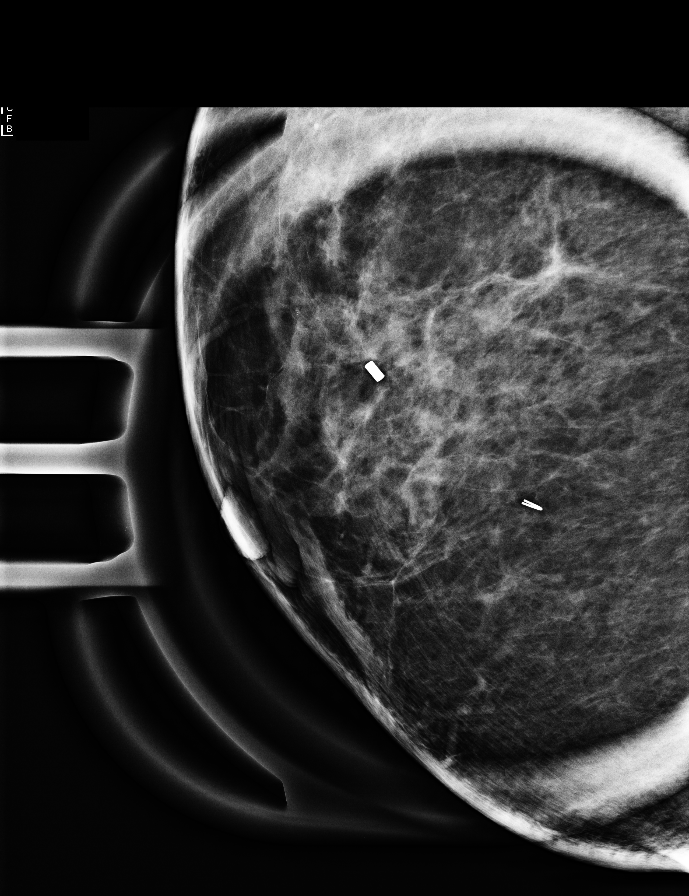

[R CC (2 of 2)]
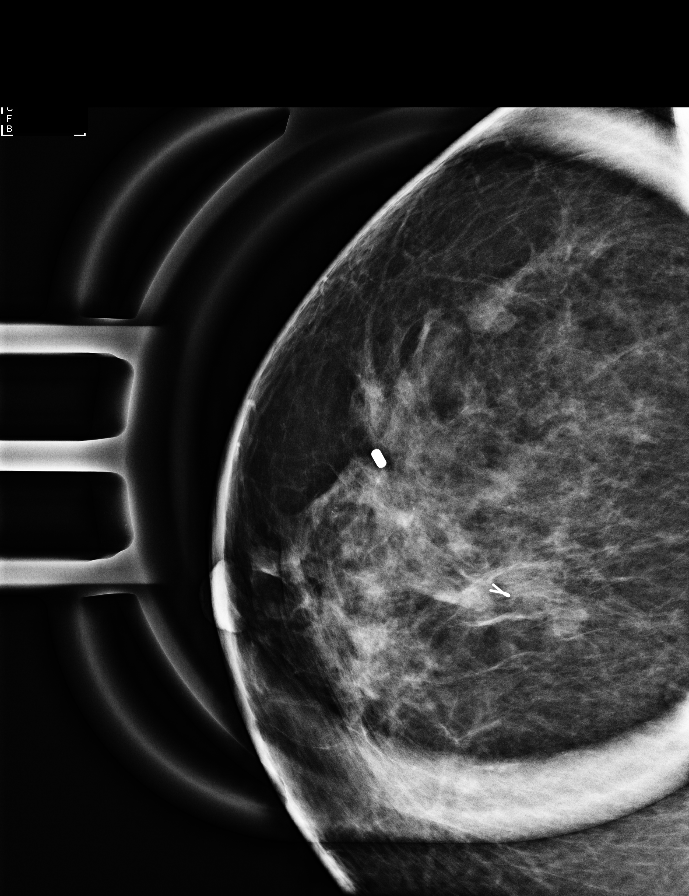

[R ML (2 of 2)]
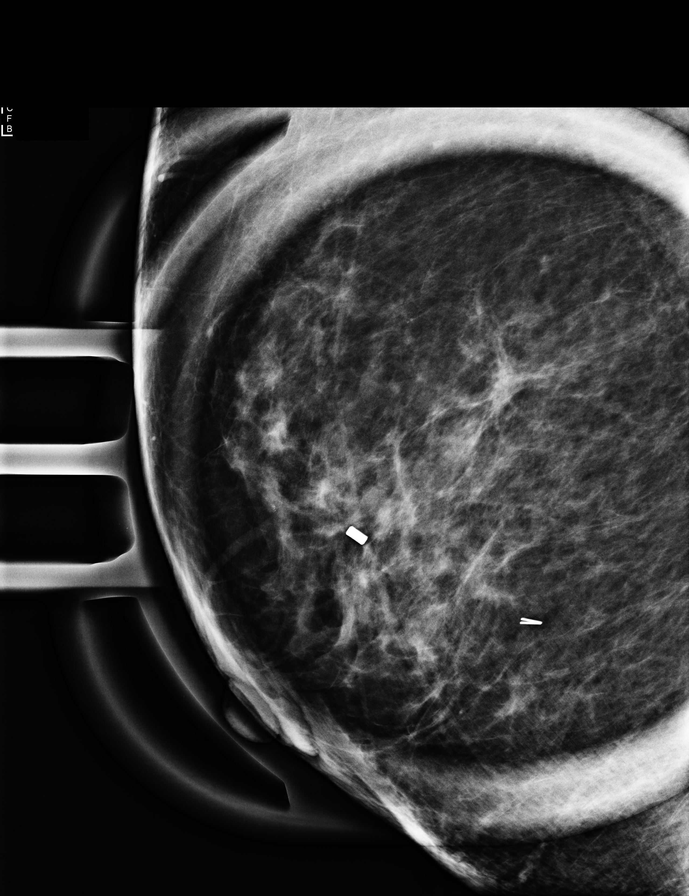

[R CC synth-2D]
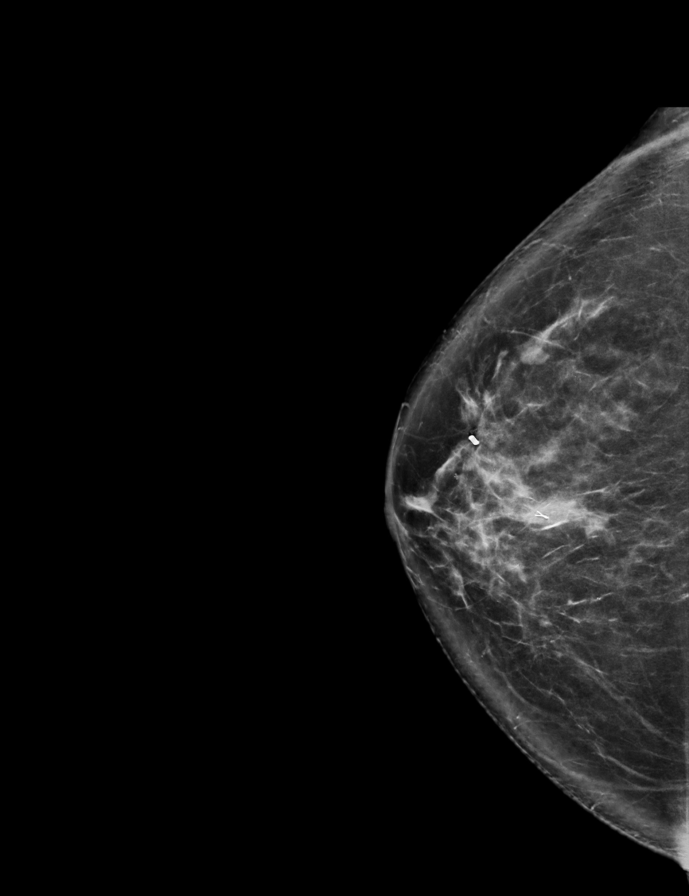

[L CC synth-2D]
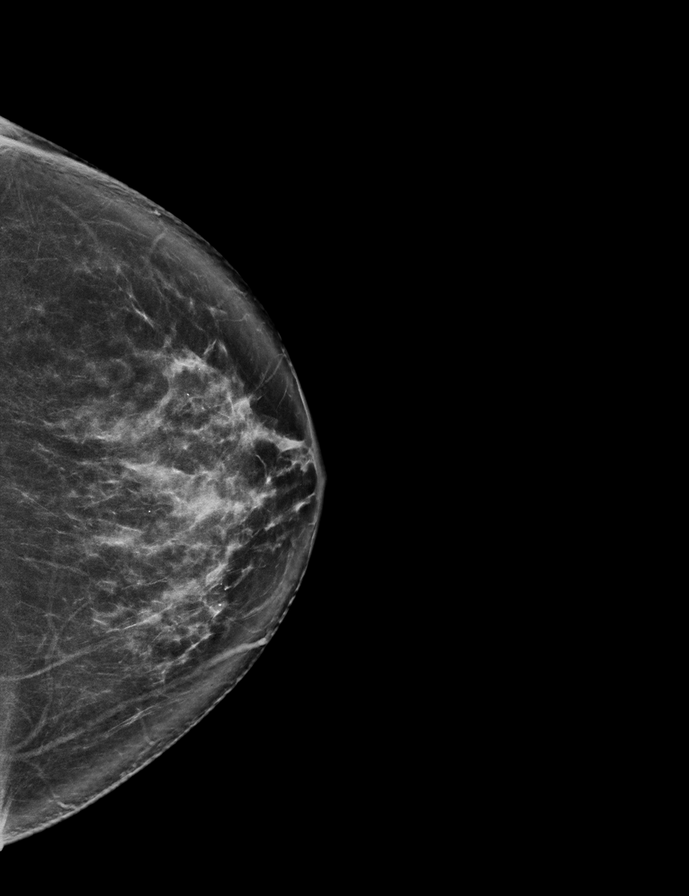

[L MLO synth-2D]
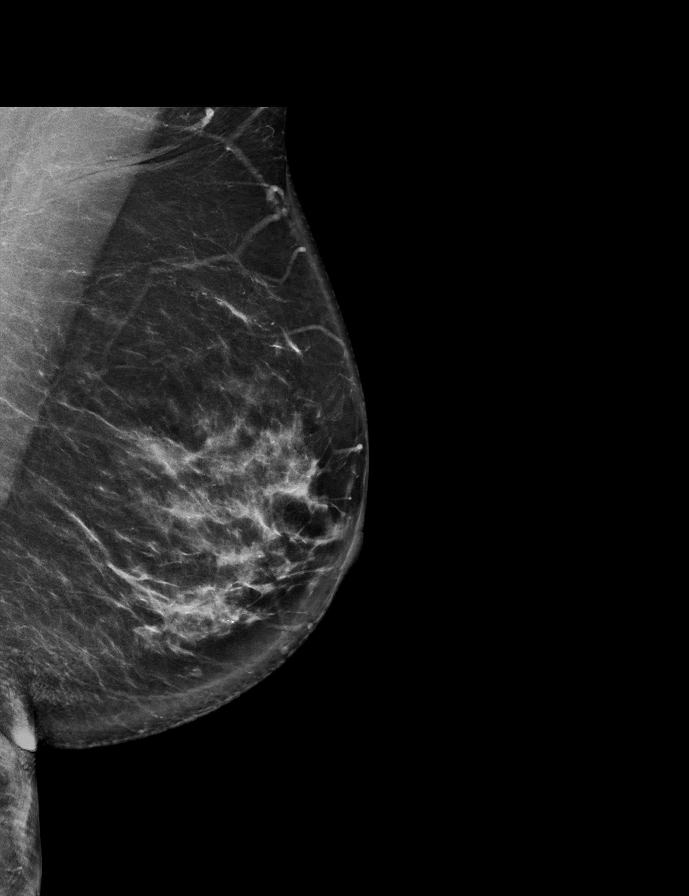

[R ML synth-2D]
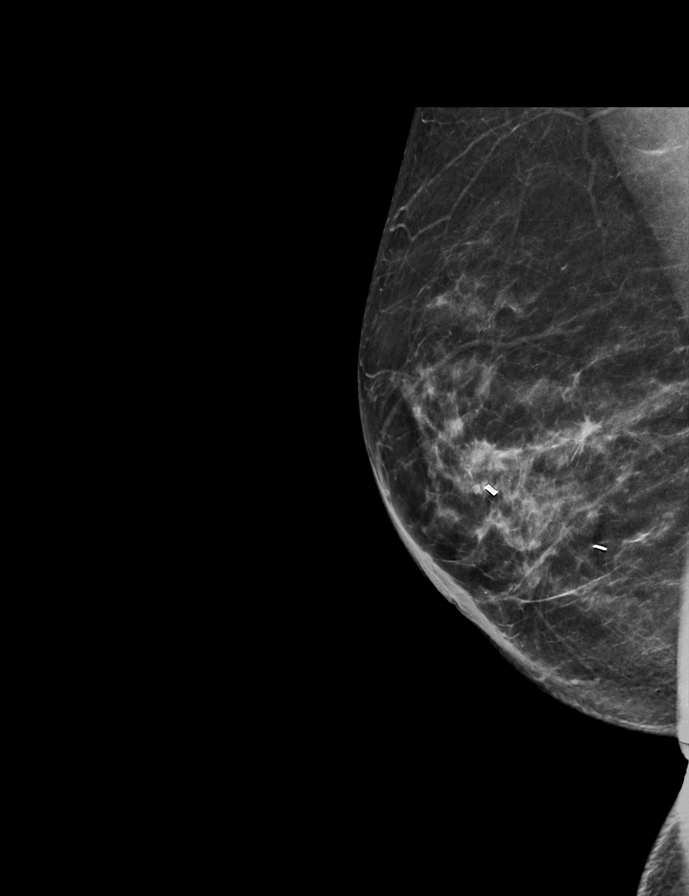

[8 of 34 positions shown; findings below may reference images not displayed]

ACR Breast Density Category c: The breast tissue is heterogeneously
dense, which may obscure small masses.
FINDINGS: Cylinder shaped marking clip within the lateral retroareolar right
breast at the site of previously biopsied ADH. Additionally there is
a ribbon shaped marking clip within the central slightly inferior
right breast. The site of biopsy was removed at prior surgery
however the clip remains as the clip was displaced from the biopsy
site at the time of biopsy.

Magnification views of the right breast were obtained. There are
loosely grouped calcifications within the anterior right breast
adjacent to the cylinder shaped marking clip. Additionally there are
a few punctate calcifications within superior right breast on the
true lateral view.

No suspicious abnormality within the left breast.
IMPRESSION: Patient with existing site of biopsied ADH within the lateral
retroareolar right breast at the site of cylinder-shaped clip.

Additionally there are loosely grouped calcifications within the
retroareolar and superior and inferior right breast.

Postsurgical changes right breast from prior excisional biopsy.

RECOMMENDATION:
There is a least 1 additional site of ADH within the lateral
retroareolar right breast which would need surgical excision.

There are additional loosely grouped calcifications within the
anterior, superior and inferior right breast which are indeterminate
in etiology given the patient's history of ADH.

Patient has surgical appointment with Dr. ATIYA next week to
discuss options. Considerations include excision of the ADH. If this
is going to be excised, consider stereotactic guided biopsy of a few
additional areas of calcifications within the right breast prior to
surgical intervention. Additionally, patient's history can be
considered and whether risk modification with mastectomy would be
warranted.

Findings of this case were discussed with Dr. ATIYA at the time
of interpretation. Patient and Dr. ATIYA follow-up after
their consultation if the patient needs any additional biopsies at
the [REDACTED].

I have discussed the findings and recommendations with the patient.
If applicable, a reminder letter will be sent to the patient
regarding the next appointment.

BI-RADS CATEGORY  4: Suspicious.

## 2020-02-27 ENCOUNTER — Other Ambulatory Visit: Payer: Self-pay | Admitting: Surgery

## 2020-03-09 ENCOUNTER — Encounter: Payer: Self-pay | Admitting: Plastic Surgery

## 2020-03-09 ENCOUNTER — Other Ambulatory Visit: Payer: Self-pay

## 2020-03-09 ENCOUNTER — Ambulatory Visit (INDEPENDENT_AMBULATORY_CARE_PROVIDER_SITE_OTHER): Payer: Managed Care, Other (non HMO) | Admitting: Plastic Surgery

## 2020-03-09 ENCOUNTER — Other Ambulatory Visit: Payer: Self-pay | Admitting: Surgery

## 2020-03-09 VITALS — BP 124/84 | HR 92 | Temp 97.8°F | Ht 62.0 in | Wt 147.0 lb

## 2020-03-09 DIAGNOSIS — N6091 Unspecified benign mammary dysplasia of right breast: Secondary | ICD-10-CM

## 2020-03-09 DIAGNOSIS — E119 Type 2 diabetes mellitus without complications: Secondary | ICD-10-CM | POA: Diagnosis not present

## 2020-03-09 NOTE — Progress Notes (Signed)
Patient ID: Lindsay Rios, female    DOB: 1966-01-23, 55 y.o.   MRN: [id number]   Chief Complaint  Patient presents with  . Advice Only    The patient is a 55 year old female here for consultation for breast reconstruction.  She is seeing Dr. Ninfa Linden.  She has atypical ductal hyperplasia.  She has a very high likelihood of breast cancer per Dr. Berneta Sages and oncology.  She is 5 feet 2 inches tall and weighs 147 pounds.  Her preoperative bra size is a B cup.  She does have diabetes and takes medication for this including Victoza.  She also has high cholesterol.  She is not a smoker.  She has a family history of breast cancer.  She had a mammogram and April 2021 and she has another MRI scheduled.  She has had 2 right breast lumpectomies by Dr. Ninfa Linden.  She does have sleep apnea and occasionally wears her CPAP machine.  Her lifetime risk of breast cancer is extremely high and repeat screening and biopsies will be required if she does not have mastectomies.  At this point in time she agrees that she would like to move ahead with bilateral mastectomies.     Review of Systems  Constitutional: Negative.  Negative for activity change and appetite change.  Eyes: Negative.   Respiratory: Negative.  Negative for chest tightness.   Cardiovascular: Negative.  Negative for leg swelling.  Gastrointestinal: Negative.  Negative for abdominal distention.  Endocrine: Negative.   Genitourinary: Negative.   Musculoskeletal: Negative.   Hematological: Negative.   Psychiatric/Behavioral: Negative.     Past Medical History:  Diagnosis Date  . ADD (attention deficit disorder)   . Allergy   . Depression   . Ejection fraction   . Gestational diabetes 1999; 2002  . History of chicken pox   . 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 Hyperlipidemia   . Hypertension   . Numbness and tingling   . Osteopenia   . Pneumonia 2012  . Sleep apnea with use of continuous positive airway pressure (CPAP)    . Type II diabetes mellitus ([place])    "borderline; I'm on Metformin"    Past Surgical History:  Procedure Laterality Date  . ANAL RECTAL MANOMETRY N/A 03/17/2015   Procedure: ANO RECTAL MANOMETRY;  Surgeon: Mauri Pole, MD;  Location: [facility];  Service: Endoscopy;  Laterality: N/A;  . BREAST LUMPECTOMY WITH RADIOACTIVE SEED LOCALIZATION Right 06/05/2019   Procedure: [name];  Surgeon: Coralie Keens, MD;  Location: [place];  Service: General;  Laterality: Right;  . LEFT HEART CATHETERIZATION WITH CORONARY ANGIOGRAM N/A 12/22/2013   Procedure: LEFT HEART CATHETERIZATION WITH CORONARY ANGIOGRAM;  Surgeon: Jettie Booze, MD;  Location: [facility] CATH LAB;  Service: Cardiovascular;  Laterality: N/A;  . SHOULDER ARTHROSCOPY W/ ROTATOR CUFF REPAIR Right 1990's  . VAGINAL HYSTERECTOMY  2013   HPV/Cervical Dysplasia; partial      Current Outpatient Medications:  .  aspirin 81 MG chewable tablet, Chew 81 mg by mouth daily. , Disp: , Rfl:  .  cetirizine (ZYRTEC) 10 MG tablet, Take 10 mg by mouth daily., Disp: , Rfl:  .  Cholecalciferol (PA VITAMIN D-3 GUMMY PO), Chew one (1) gummy by mouth daily., Disp: , Rfl:  .  empagliflozin (JARDIANCE) 25 MG TABS tablet, Take 25 mg by mouth daily., Disp: , Rfl:  .  glycopyrrolate (ROBINUL) 2 MG tablet,  Take 1 tablet (2 mg total) by mouth 2 (two) times daily., Disp: 180 tablet, Rfl: 3 .  Insulin Glargine (BASAGLAR KWIKPEN) 100 UNIT/ML, Inject 34 Units into the skin daily., Disp: , Rfl:  .  liraglutide (VICTOZA) 18 MG/3ML SOPN, Inject 0.8 mg into the skin every morning., Disp: , Rfl:  .  lisdexamfetamine (VYVANSE) 20 MG capsule, Take 20 mg by mouth daily., Disp: , Rfl:  .  lisinopril (ZESTRIL) 10 MG tablet, Take 1 tablet (10 mg total) by mouth daily., Disp: 90 tablet, Rfl: 3 .  Loperamide HCl (IMODIUM PO), Take 1-2 tablets by mouth daily., Disp: , Rfl:  .  Multiple  Vitamins-Minerals (ALIVE WOMENS GUMMY PO), Chew one (1) gummy by mouth daily., Disp: , Rfl:  .  nitroGLYCERIN (NITROSTAT) 0.4 MG SL tablet, Place 1 tablet (0.4 mg total) under the tongue every 5 (five) minutes as needed for chest pain., Disp: 30 tablet, Rfl: 12 .  oxyCODONE (OXY IR/ROXICODONE) 5 MG immediate release tablet, Take 1 tablet (5 mg total) by mouth every 6 (six) hours as needed for moderate pain or severe pain., Disp: 25 tablet, Rfl: 0 .  rosuvastatin (CRESTOR) 20 MG tablet, Take 20 mg by mouth daily., Disp: , Rfl:  .  tamoxifen (NOLVADEX) 20 MG tablet, Take 1 tablet (20 mg total) by mouth daily., Disp: 90 tablet, Rfl: 3  Current Facility-Administered Medications:  .  0.9 %  sodium chloride infusion, 500 mL, Intravenous, Once, Ladene Artist, MD   Objective:   Vitals:   03/09/20 1358  BP: 124/84  Pulse: 92  Temp: 97.8 F (36.6 C)  SpO2: 98%    Physical Exam Vitals and nursing note reviewed.  Constitutional:      Appearance: Normal appearance.  HENT:     Head: Normocephalic and atraumatic.  Cardiovascular:     Rate and Rhythm: Normal rate.     Pulses: Normal pulses.  Pulmonary:     Effort: Pulmonary effort is normal.  Abdominal:     General: Abdomen is flat. There is no distension.     Tenderness: There is no abdominal tenderness.  Skin:    General: Skin is warm.     Coloration: Skin is not jaundiced.     Findings: Bruising present.  Neurological:     General: No focal deficit present.     Mental Status: She is alert and oriented to person, place, and time.  Psychiatric:        Mood and Affect: Mood normal.        Behavior: Behavior normal.        Thought Content: Thought content normal.     Assessment & Plan:  Atypical ductal hyperplasia of right breast  Type 2 diabetes mellitus without complication, without long-term current use of insulin (HCC)  We had a detailed conversation about the patient's options for breast reconstruction. Several  reconstruction options were explained to the patient.  It is important to remember that breast reconstruction is an optional procedure. Reconstruction often requires several stages of surgery and this means more than one operation.  The surgeries are often done several months apart.  The entire process from start to finish can take a year or more. The major goal of breast reconstruction is to look normal in clothing. There will always be scars and a difference noticeable without clothes.  This is true for asymmetries where both breasts will not be identical.  Surgery may be needed or desired to the non-cancerous breast in order to  achieve better symmetry and satisfactory results.  Regardless of the reconstructive method, there is always risks and the possibility that the procedure will fail or have complications.  This could required additional surgeries.    We discussed the available methods of breast reconstruction and included:  1. Tissue expander with Acellular dermal matrix followed by implant based reconstruction. This can be done as one surgery or multiple surgeries.  2. Autologous reconstruction can include using a muscle or tissue from another area of the body for the reconstruction.  3. Combined procedures like the latissismus dorsi flaps that often uses the muscle with an expander or implant.  For each of the method discussed the risks, benefits, scars and recovery time were discussed in detail. Specific risks included bleeding, infection, hematoma, seroma, scarring, pain, wound healing complications, flap loss, fat necrosis, capsular contracture, need for implant removal, donor site complications, bulge, hernia, umbilical necrosis, need for urgent reoperation, and need for dressing changes.   After the options were discussed we focused on the patient's desires and the procedure that was best for her based on all the information.  A total of 45 minutes of face-to-face time was spent in this  encounter, of which >60% was spent in counseling.    It is a good candidate for bilateral immediate breast reconstruction with expanders and Flex HD.  We also discussed the other options.  She is not interested in autologous reconstruction at this time.  She understands that this would be 2 surgeries.  She could likely keep her nipple areolas.  She has a slight increased risk of necrosis of the right nipple due to her previous surgeries.  Plan for bilateral immediate breast reconstruction with expanders and Flex HD.   I have spoken to Dr. Ninfa Linden.  I have confirmed the above information is correct with the patient.  Pictures were obtained of the patient and placed in the chart with the patient's or guardian's permission.   [place], DO

## 2020-03-16 ENCOUNTER — Ambulatory Visit: Payer: Managed Care, Other (non HMO)

## 2020-03-30 ENCOUNTER — Other Ambulatory Visit: Payer: Self-pay

## 2020-03-30 ENCOUNTER — Encounter: Payer: Self-pay | Admitting: Surgical

## 2020-03-30 ENCOUNTER — Ambulatory Visit (INDEPENDENT_AMBULATORY_CARE_PROVIDER_SITE_OTHER): Payer: Managed Care, Other (non HMO) | Admitting: Surgical

## 2020-03-30 VITALS — BP 119/76 | HR 107 | Ht 63.0 in | Wt 147.8 lb

## 2020-03-30 DIAGNOSIS — E119 Type 2 diabetes mellitus without complications: Secondary | ICD-10-CM

## 2020-03-30 DIAGNOSIS — N6091 Unspecified benign mammary dysplasia of right breast: Secondary | ICD-10-CM

## 2020-03-30 MED ORDER — DIAZEPAM 2 MG PO TABS: 2.0000 mg | ORAL_TABLET | Freq: Two times a day (BID) | ORAL | 0 refills | Status: DC | PRN

## 2020-03-30 MED ORDER — ONDANSETRON HCL 4 MG PO TABS: 4.0000 mg | ORAL_TABLET | Freq: Three times a day (TID) | ORAL | 0 refills | Status: DC | PRN

## 2020-03-30 MED ORDER — HYDROCODONE-ACETAMINOPHEN 5-325 MG PO TABS
1.0000 | ORAL_TABLET | Freq: Four times a day (QID) | ORAL | 0 refills | Status: AC | PRN
Start: 2020-03-30 — End: 2020-04-04

## 2020-03-30 MED ORDER — CEPHALEXIN 500 MG PO CAPS: 500.0000 mg | ORAL_CAPSULE | Freq: Four times a day (QID) | ORAL | 0 refills | Status: AC

## 2020-03-30 NOTE — H&P (View-Only) (Signed)
Patient ID: Lindsay Rios, female    DOB: 08-27-1965, 55 y.o.   MRN: [id number]  Chief Complaint  Patient presents with  . Pre-op Exam     ICD-10-CM   1. Atypical ductal hyperplasia of right breast  N60.91   2. Type 2 diabetes mellitus without complication, without long-term current use of insulin (HCC)  E11.9     History of Present Illness: Lindsay Rios is a 55 y.o.  female  with a history of atypical ductal hyperplasia  She presents for preoperative evaluation for upcoming procedure, bilateral nipple sparing mastectomy by Dr. Nedra Hai with general surgery followed by bilateral immediate breast reconstruction with placement of tissue expanders and Flex HD with Dr. Marla Roe, scheduled for 04/19/2020.  The patient has not had problems with anesthesia. No history of DVT/PE.  No family history of DVT/PE.  No family or personal history of bleeding or clotting disorders.  Patient is not currently taking any blood thinners.  No history of CVA/MI.   Summary of Previous Visit: Patient has atypical ductal hyperplasia, high likelihood of breast cancer per Dr. Arnoldo Morale and oncology.  She is 5 feet 2 inches tall and weighs 147 pounds.  Her preop bra size is a B cup.  She is a diabetic.  She is not a smoker.  She has a history of 2 right breast lumpectomies by Dr. Ninfa Linden.  PMH Significant for: Type 2 diabetes mellitus -most recent A1c 6.9 per EMR review.  Hyperlipidemia, hypertension Patient is currently on ASA 81 mg daily.  She has a history of coronary artery disease, this is followed by cardiology.  She is currently on tamoxifen History of OSA on CPAP, reports she is mostly compliant with the CPAP, however sometimes she does not use it because it gives her some trouble. Patient last saw cardiology on 04/22/2019 for follow-up for nonobstructive CAD  She reports that she has not been taking ASA 81 mg lately.  This has not been prescribed by anyone, she was taking it  over-the-counter.   Past Medical History: Allergies: Allergies  Allergen Reactions  . Other Nausea Only and Other (See Comments)    REACTION: nausea   . Sulfa Antibiotics Nausea Only    REACTION: nausea    Current Medications:  Current Outpatient Medications:  .  aspirin 81 MG chewable tablet, Chew 81 mg by mouth daily., Disp: , Rfl:  .  cephALEXin (KEFLEX) 500 MG capsule, Take 1 capsule (500 mg total) by mouth 4 (four) times daily for 5 days., Disp: 20 capsule, Rfl: 0 .  cetirizine (ZYRTEC) 10 MG tablet, Take 10 mg by mouth daily., Disp: , Rfl:  .  Cholecalciferol (PA VITAMIN D-3 GUMMY PO), Chew one (1) gummy by mouth daily., Disp: , Rfl:  .  diazepam (VALIUM) 2 MG tablet, Take 1 tablet (2 mg total) by mouth every 12 (twelve) hours as needed for muscle spasms., Disp: 20 tablet, Rfl: 0 .  empagliflozin (JARDIANCE) 25 MG TABS tablet, Take 25 mg by mouth daily., Disp: , Rfl:  .  glycopyrrolate (ROBINUL) 2 MG tablet, Take 1 tablet (2 mg total) by mouth 2 (two) times daily., Disp: 180 tablet, Rfl: 3 .  HYDROcodone-acetaminophen (NORCO) 5-325 MG tablet, Take 1 tablet by mouth every 6 (six) hours as needed for up to 5 days for severe pain., Disp: 20 tablet, Rfl: 0 .  Insulin Glargine (BASAGLAR KWIKPEN) 100 UNIT/ML, Inject 34 Units into the skin daily., Disp: , Rfl:  .  liraglutide (  VICTOZA) 18 MG/3ML SOPN, Inject 0.8 mg into the skin every morning., Disp: , Rfl:  .  lisdexamfetamine (VYVANSE) 20 MG capsule, Take 20 mg by mouth daily., Disp: , Rfl:  .  lisinopril (ZESTRIL) 10 MG tablet, Take 1 tablet (10 mg total) by mouth daily., Disp: 90 tablet, Rfl: 3 .  Loperamide HCl (IMODIUM PO), Take 1-2 tablets by mouth daily., Disp: , Rfl:  .  Multiple Vitamins-Minerals (ALIVE WOMENS GUMMY PO), Chew one (1) gummy by mouth daily., Disp: , Rfl:  .  nitroGLYCERIN (NITROSTAT) 0.4 MG SL tablet, Place 1 tablet (0.4 mg total) under the tongue every 5 (five) minutes as needed for chest pain., Disp: 30  tablet, Rfl: 12 .  ondansetron (ZOFRAN) 4 MG tablet, Take 1 tablet (4 mg total) by mouth every 8 (eight) hours as needed for nausea or vomiting., Disp: 20 tablet, Rfl: 0 .  rosuvastatin (CRESTOR) 20 MG tablet, Take 20 mg by mouth daily., Disp: , Rfl:  .  oxyCODONE (OXY IR/ROXICODONE) 5 MG immediate release tablet, Take 1 tablet (5 mg total) by mouth every 6 (six) hours as needed for moderate pain or severe pain., Disp: 25 tablet, Rfl: 0 .  tamoxifen (NOLVADEX) 10 MG tablet, tamoxifen 10 mg tablet  Take 1 tablet twice a day by oral route., Disp: , Rfl:   Current Facility-Administered Medications:  .  0.9 %  sodium chloride infusion, 500 mL, Intravenous, Once, Ladene Artist, MD  Past Medical Problems: Past Medical History:  Diagnosis Date  . ADD (attention deficit disorder)   . Allergy   . Depression   . Ejection fraction   . Gestational diabetes 1999; 2002  . History of chicken pox   . 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 Hyperlipidemia   . Hypertension   . Numbness and tingling   . Osteopenia   . Pneumonia 2012  . Sleep apnea with use of continuous positive airway pressure (CPAP)   . Type II diabetes mellitus ([place])    "borderline; I'm on Metformin"    Past Surgical History: Past Surgical History:  Procedure Laterality Date  . ANAL RECTAL MANOMETRY N/A 03/17/2015   Procedure: ANO RECTAL MANOMETRY;  Surgeon: Mauri Pole, MD;  Location: [facility];  Service: Endoscopy;  Laterality: N/A;  . BREAST LUMPECTOMY WITH RADIOACTIVE SEED LOCALIZATION Right 06/05/2019   Procedure: [name];  Surgeon: Coralie Keens, MD;  Location: [place];  Service: General;  Laterality: Right;  . LEFT HEART CATHETERIZATION WITH CORONARY ANGIOGRAM N/A 12/22/2013   Procedure: LEFT HEART CATHETERIZATION WITH CORONARY ANGIOGRAM;  Surgeon: Jettie Booze, MD;  Location: [facility] CATH [facility];  Service: Cardiovascular;  Laterality:  N/A;  . SHOULDER ARTHROSCOPY W/ ROTATOR CUFF REPAIR Right 1990's  . VAGINAL HYSTERECTOMY  2013   HPV/Cervical Dysplasia; partial    Social History: Social History   Socioeconomic History  . Marital status: Divorced    Spouse name: [name] (Separated)   . Number of children: 2  . Years of education: 24  . Highest education level: Not on file  Occupational History  . 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 Occupation: Psychologist, sport and exercise  Tobacco Use  . Smoking status: Never Smoker  . Smokeless tobacco: Never Used  Vaping Use  . Vaping Use: Never used  Substance and Sexual Activity  . Alcohol use: No    Alcohol/week: 0.0 standard drinks  Comment: rare  . Drug use: No  . Sexual activity: Not Currently    Partners: Male  Other Topics Concern  . Not on file  Social History Narrative   Marital Status: Separated [name]) Significant Other [name])    Children:  Son (1) Daughter (1)    Pets: Dogs (2), Cat (1)    Living Situation: Lives with children.   Occupation: Full- Time Student    Education: [employer] ([place]    Tobacco Use: She has never smoked.     Alcohol Use:  Rarely   Drug Use:  None   Diet:  Regular   Exercise:  None   Hobbies: Shopping      Social Determinants of Health   Financial Resource Strain: Not on file  Food Insecurity: Not on file  Transportation Needs: Not on file  Physical Activity: Not on file  Stress: Not on file  Social Connections: Not on file  Intimate Partner Violence: Not on file    Family History: Family History  Problem Relation Age of Onset  . Hyperlipidemia Mother   . Hyperlipidemia Father   . Hypertension Father   . Diabetes Father   . Heart disease Father   . Stroke Father   . Cancer Maternal Grandmother        Colon Cancer  . Alzheimer's disease Maternal Grandmother   . Colon cancer Maternal Grandmother   . Heart disease Brother 40       Myocardial Infarction  . Breast cancer Paternal Aunt   .  Esophageal cancer Neg Hx   . Stomach cancer Neg Hx   . Rectal cancer Neg Hx     Review of Systems: Review of Systems  Constitutional: Negative.   Respiratory: Negative.   Cardiovascular: Negative.   Gastrointestinal: Negative.   Neurological: Negative.     Physical Exam: Vital Signs BP 119/76 (BP Location: Left Arm, Patient Position: Sitting, Cuff Size: Normal)   Pulse (!) 107   Ht 5\' 3"  (1.6 m)   Wt 147 lb 12.8 oz (67 kg)   SpO2 96%   BMI 26.18 kg/m   Physical Exam  Constitutional:      General: Not in acute distress.    Appearance: Normal appearance. Not ill-appearing.  HENT:     Head: Normocephalic and atraumatic.  Eyes:     Pupils: Pupils are equal, round Neck:     Musculoskeletal: Normal range of motion.  Cardiovascular:     Rate and Rhythm: Normal rate and regular rhythm.     Pulses: Normal pulses.     Heart sounds: Normal heart sounds. No murmur.  Pulmonary:     Effort: Pulmonary effort is normal. No respiratory distress.     Breath sounds: Normal breath sounds. No wheezing.  Abdominal:     General: Abdomen is flat. There is no distension.     Palpations: Abdomen is soft.     Tenderness: There is no abdominal tenderness.  Musculoskeletal: Normal range of motion.  Skin:    General: Skin is warm and dry.     Findings: No erythema or rash.  Neurological:     General: No focal deficit present.     Mental Status: Alert and oriented to person, place, and time. Mental status is at baseline.     Motor: No weakness.  Psychiatric:        Mood and Affect: Mood normal.        Behavior: Behavior normal.    Assessment/Plan: The patient is scheduled  for bilateral nipple sparing mastectomy by general surgery followed by bilateral immediate breast reconstruction with placement of tissue expanders and Flex HD with Dr. Marla Roe. Risks, benefits, and alternatives of procedure discussed, questions answered and consent obtained.    Smoking Status: Non-smoker;  Counseling Given?  N/A Last Mammogram: Diagnostic bilateral mammogram on 02/19/2020; Results: Loosely grouped calcifications within the anterior right breast adjacent to the cylinder shaped marking clip.  Few punctate calcifications within the superior right breast on the true lateral view. Patient had MRI of bilateral breast on 01/18/2020 which showed multiple clumped masses in the outer and lower outer right breast.  Caprini Score: 5, high; Risk Factors include: Age, BMI greater than 25, currently on tamoxifen and length of planned surgery. Recommendation for mechanical And pharmacological prophylaxis. Encourage early ambulation.   Pictures obtained:[email or web address]   Post-op Rx sent to pharmacy: Norco, Zofran, Keflex, valium  Clearances per general surgery, we have reached out to general surgical team to confirm if they have sent for clearances or not.  Patient was provided with the breast reconstruction and General Surgical Risk consent document and Pain Medication Agreement prior to their appointment.  They had adequate time to read through the risk consent documents and Pain Medication Agreement. We also discussed them in person together during this preop appointment. All of their questions were answered to their satisfaction.  Recommended calling if they have any further questions.  Risk consent form and Pain Medication Agreement to be scanned into patient's chart.  The risks that can be encountered with and after placement of a breast expander placement were discussed and include the following but not limited to these: bleeding, infection, delayed healing, anesthesia risks, skin sensation changes, injury to structures including nerves, blood vessels, and muscles which may be temporary or permanent, allergies to tape, suture materials and glues, blood products, topical preparations or injected agents, skin contour irregularities, skin discoloration and swelling, deep vein thrombosis, cardiac and  pulmonary complications, pain, which may persist, fluid accumulation, wrinkling of the skin over the expander, changes in nipple or breast sensation, expander leakage or rupture, faulty position of the expander, persistent pain, formation of tight scar tissue around the expander (capsular contracture), possible need for revisional surgery or staged procedures.  Electronically signed by: Carola Rhine Scheeler, PA-C 03/30/2020 4:19 PM

## 2020-03-30 NOTE — Progress Notes (Signed)
Patient ID: Lindsay Rios, female    DOB: 1965-12-13, 55 y.o.   MRN: [id number]  Chief Complaint  Patient presents with  . Pre-op Exam     ICD-10-CM   1. Atypical ductal hyperplasia of right breast  N60.91   2. Type 2 diabetes mellitus without complication, without long-term current use of insulin (HCC)  E11.9     History of Present Illness: Lindsay Rios is a 55 y.o.  female  with a history of atypical ductal hyperplasia  She presents for preoperative evaluation for upcoming procedure, bilateral nipple sparing mastectomy by Dr. Nedra Hai with general surgery followed by bilateral immediate breast reconstruction with placement of tissue expanders and Flex HD with Dr. Marla Roe, scheduled for 04/19/2020.  The patient has not had problems with anesthesia. No history of DVT/PE.  No family history of DVT/PE.  No family or personal history of bleeding or clotting disorders.  Patient is not currently taking any blood thinners.  No history of CVA/MI.   Summary of Previous Visit: Patient has atypical ductal hyperplasia, high likelihood of breast cancer per Dr. Arnoldo Morale and oncology.  She is 5 feet 2 inches tall and weighs 147 pounds.  Her preop bra size is a B cup.  She is a diabetic.  She is not a smoker.  She has a history of 2 right breast lumpectomies by Dr. Ninfa Linden.  PMH Significant for: Type 2 diabetes mellitus -most recent A1c 6.9 per EMR review.  Hyperlipidemia, hypertension Patient is currently on ASA 81 mg daily.  She has a history of coronary artery disease, this is followed by cardiology.  She is currently on tamoxifen History of OSA on CPAP, reports she is mostly compliant with the CPAP, however sometimes she does not use it because it gives her some trouble. Patient last saw cardiology on 04/22/2019 for follow-up for nonobstructive CAD  She reports that she has not been taking ASA 81 mg lately.  This has not been prescribed by anyone, she was taking it  over-the-counter.   Past Medical History: Allergies: Allergies  Allergen Reactions  . Other Nausea Only and Other (See Comments)    REACTION: nausea   . Sulfa Antibiotics Nausea Only    REACTION: nausea    Current Medications:  Current Outpatient Medications:  .  aspirin 81 MG chewable tablet, Chew 81 mg by mouth daily., Disp: , Rfl:  .  cephALEXin (KEFLEX) 500 MG capsule, Take 1 capsule (500 mg total) by mouth 4 (four) times daily for 5 days., Disp: 20 capsule, Rfl: 0 .  cetirizine (ZYRTEC) 10 MG tablet, Take 10 mg by mouth daily., Disp: , Rfl:  .  Cholecalciferol (PA VITAMIN D-3 GUMMY PO), Chew one (1) gummy by mouth daily., Disp: , Rfl:  .  diazepam (VALIUM) 2 MG tablet, Take 1 tablet (2 mg total) by mouth every 12 (twelve) hours as needed for muscle spasms., Disp: 20 tablet, Rfl: 0 .  empagliflozin (JARDIANCE) 25 MG TABS tablet, Take 25 mg by mouth daily., Disp: , Rfl:  .  glycopyrrolate (ROBINUL) 2 MG tablet, Take 1 tablet (2 mg total) by mouth 2 (two) times daily., Disp: 180 tablet, Rfl: 3 .  HYDROcodone-acetaminophen (NORCO) 5-325 MG tablet, Take 1 tablet by mouth every 6 (six) hours as needed for up to 5 days for severe pain., Disp: 20 tablet, Rfl: 0 .  Insulin Glargine (BASAGLAR KWIKPEN) 100 UNIT/ML, Inject 34 Units into the skin daily., Disp: , Rfl:  .  liraglutide (  VICTOZA) 18 MG/3ML SOPN, Inject 0.8 mg into the skin every morning., Disp: , Rfl:  .  lisdexamfetamine (VYVANSE) 20 MG capsule, Take 20 mg by mouth daily., Disp: , Rfl:  .  lisinopril (ZESTRIL) 10 MG tablet, Take 1 tablet (10 mg total) by mouth daily., Disp: 90 tablet, Rfl: 3 .  Loperamide HCl (IMODIUM PO), Take 1-2 tablets by mouth daily., Disp: , Rfl:  .  Multiple Vitamins-Minerals (ALIVE WOMENS GUMMY PO), Chew one (1) gummy by mouth daily., Disp: , Rfl:  .  nitroGLYCERIN (NITROSTAT) 0.4 MG SL tablet, Place 1 tablet (0.4 mg total) under the tongue every 5 (five) minutes as needed for chest pain., Disp: 30  tablet, Rfl: 12 .  ondansetron (ZOFRAN) 4 MG tablet, Take 1 tablet (4 mg total) by mouth every 8 (eight) hours as needed for nausea or vomiting., Disp: 20 tablet, Rfl: 0 .  rosuvastatin (CRESTOR) 20 MG tablet, Take 20 mg by mouth daily., Disp: , Rfl:  .  oxyCODONE (OXY IR/ROXICODONE) 5 MG immediate release tablet, Take 1 tablet (5 mg total) by mouth every 6 (six) hours as needed for moderate pain or severe pain., Disp: 25 tablet, Rfl: 0 .  tamoxifen (NOLVADEX) 10 MG tablet, tamoxifen 10 mg tablet  Take 1 tablet twice a day by oral route., Disp: , Rfl:   Current Facility-Administered Medications:  .  0.9 %  sodium chloride infusion, 500 mL, Intravenous, Once, Ladene Artist, MD  Past Medical Problems: Past Medical History:  Diagnosis Date  . ADD (attention deficit disorder)   . Allergy   . Depression   . Ejection fraction   . Gestational diabetes 1999; 2002  . History of chicken pox   . 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 Hyperlipidemia   . Hypertension   . Numbness and tingling   . Osteopenia   . Pneumonia 2012  . Sleep apnea with use of continuous positive airway pressure (CPAP)   . Type II diabetes mellitus ([place])    "borderline; I'm on Metformin"    Past Surgical History: Past Surgical History:  Procedure Laterality Date  . ANAL RECTAL MANOMETRY N/A 03/17/2015   Procedure: ANO RECTAL MANOMETRY;  Surgeon: Mauri Pole, MD;  Location: [facility];  Service: Endoscopy;  Laterality: N/A;  . BREAST LUMPECTOMY WITH RADIOACTIVE SEED LOCALIZATION Right 06/05/2019   Procedure: [name];  Surgeon: Coralie Keens, MD;  Location: [place];  Service: General;  Laterality: Right;  . LEFT HEART CATHETERIZATION WITH CORONARY ANGIOGRAM N/A 12/22/2013   Procedure: LEFT HEART CATHETERIZATION WITH CORONARY ANGIOGRAM;  Surgeon: Jettie Booze, MD;  Location: [facility];  Service: Cardiovascular;  Laterality:  N/A;  . SHOULDER ARTHROSCOPY W/ ROTATOR CUFF REPAIR Right 1990's  . VAGINAL HYSTERECTOMY  2013   HPV/Cervical Dysplasia; partial    Social History: Social History   Socioeconomic History  . Marital status: Divorced    Spouse name: [name] (Separated)   . Number of children: 2  . Years of education: 65  . Highest education level: Not on file  Occupational History  . 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 Occupation: Psychologist, sport and exercise  Tobacco Use  . Smoking status: Never Smoker  . Smokeless tobacco: Never Used  Vaping Use  . Vaping Use: Never used  Substance and Sexual Activity  . Alcohol use: No    Alcohol/week: 0.0 standard drinks  Comment: rare  . Drug use: No  . Sexual activity: Not Currently    Partners: Male  Other Topics Concern  . Not on file  Social History Narrative   Marital Status: Separated [name]) Significant Other [name])    Children:  Son (1) Daughter (1)    Pets: Dogs (2), Cat (1)    Living Situation: Lives with children.   Occupation: Full- Time Student    Education: [employer] ([place])    Tobacco Use: She has never smoked.     Alcohol Use:  Rarely   Drug Use:  None   Diet:  Regular   Exercise:  None   Hobbies: Shopping      Social Determinants of Health   Financial Resource Strain: Not on file  Food Insecurity: Not on file  Transportation Needs: Not on file  Physical Activity: Not on file  Stress: Not on file  Social Connections: Not on file  Intimate Partner Violence: Not on file    Family History: Family History  Problem Relation Age of Onset  . Hyperlipidemia Mother   . Hyperlipidemia Father   . Hypertension Father   . Diabetes Father   . Heart disease Father   . Stroke Father   . Cancer Maternal Grandmother        Colon Cancer  . Alzheimer's disease Maternal Grandmother   . Colon cancer Maternal Grandmother   . Heart disease Brother 40       Myocardial Infarction  . Breast cancer Paternal Aunt   .  Esophageal cancer Neg Hx   . Stomach cancer Neg Hx   . Rectal cancer Neg Hx     Review of Systems: Review of Systems  Constitutional: Negative.   Respiratory: Negative.   Cardiovascular: Negative.   Gastrointestinal: Negative.   Neurological: Negative.     Physical Exam: Vital Signs BP 119/76 (BP Location: Left Arm, Patient Position: Sitting, Cuff Size: Normal)   Pulse (!) 107   Ht 5\' 3"  (1.6 m)   Wt 147 lb 12.8 oz (67 kg)   SpO2 96%   BMI 26.18 kg/m   Physical Exam  Constitutional:      General: Not in acute distress.    Appearance: Normal appearance. Not ill-appearing.  HENT:     Head: Normocephalic and atraumatic.  Eyes:     Pupils: Pupils are equal, round Neck:     Musculoskeletal: Normal range of motion.  Cardiovascular:     Rate and Rhythm: Normal rate and regular rhythm.     Pulses: Normal pulses.     Heart sounds: Normal heart sounds. No murmur.  Pulmonary:     Effort: Pulmonary effort is normal. No respiratory distress.     Breath sounds: Normal breath sounds. No wheezing.  Abdominal:     General: Abdomen is flat. There is no distension.     Palpations: Abdomen is soft.     Tenderness: There is no abdominal tenderness.  Musculoskeletal: Normal range of motion.  Skin:    General: Skin is warm and dry.     Findings: No erythema or rash.  Neurological:     General: No focal deficit present.     Mental Status: Alert and oriented to person, place, and time. Mental status is at baseline.     Motor: No weakness.  Psychiatric:        Mood and Affect: Mood normal.        Behavior: Behavior normal.    Assessment/Plan: The patient is scheduled  for bilateral nipple sparing mastectomy by general surgery followed by bilateral immediate breast reconstruction with placement of tissue expanders and Flex HD with Dr. Marla Roe. Risks, benefits, and alternatives of procedure discussed, questions answered and consent obtained.    Smoking Status: Non-smoker;  Counseling Given?  N/A Last Mammogram: Diagnostic bilateral mammogram on 02/19/2020; Results: Loosely grouped calcifications within the anterior right breast adjacent to the cylinder shaped marking clip.  Few punctate calcifications within the superior right breast on the true lateral view. Patient had MRI of bilateral breast on 01/18/2020 which showed multiple clumped masses in the outer and lower outer right breast.  Caprini Score: 5, high; Risk Factors include: Age, BMI greater than 25, currently on tamoxifen and length of planned surgery. Recommendation for mechanical And pharmacological prophylaxis. Encourage early ambulation.   Pictures obtained:[email or web address]   Post-op Rx sent to pharmacy: Norco, Zofran, Keflex, valium  Clearances per general surgery, we have reached out to general surgical team to confirm if they have sent for clearances or not.  Patient was provided with the breast reconstruction and General Surgical Risk consent document and Pain Medication Agreement prior to their appointment.  They had adequate time to read through the risk consent documents and Pain Medication Agreement. We also discussed them in person together during this preop appointment. All of their questions were answered to their satisfaction.  Recommended calling if they have any further questions.  Risk consent form and Pain Medication Agreement to be scanned into patient's chart.  The risks that can be encountered with and after placement of a breast expander placement were discussed and include the following but not limited to these: bleeding, infection, delayed healing, anesthesia risks, skin sensation changes, injury to structures including nerves, blood vessels, and muscles which may be temporary or permanent, allergies to tape, suture materials and glues, blood products, topical preparations or injected agents, skin contour irregularities, skin discoloration and swelling, deep vein thrombosis, cardiac and  pulmonary complications, pain, which may persist, fluid accumulation, wrinkling of the skin over the expander, changes in nipple or breast sensation, expander leakage or rupture, faulty position of the expander, persistent pain, formation of tight scar tissue around the expander (capsular contracture), possible need for revisional surgery or staged procedures.  Electronically signed by: Carola Rhine Scheeler, PA-C 03/30/2020 4:19 PM

## 2020-04-12 ENCOUNTER — Encounter (HOSPITAL_BASED_OUTPATIENT_CLINIC_OR_DEPARTMENT_OTHER): Payer: Self-pay | Admitting: Surgery

## 2020-04-12 ENCOUNTER — Other Ambulatory Visit: Payer: Self-pay

## 2020-04-12 ENCOUNTER — Telehealth: Payer: Self-pay

## 2020-04-12 NOTE — Telephone Encounter (Signed)
Patient called to say that she is scheduled to have surgery on Monday 04/19/20 and we have prescribed some medication for her.  She would like to know if she should start taking the medicine before or after her surgery.  Please call.

## 2020-04-12 NOTE — Telephone Encounter (Signed)
Returned patients call. Advised to start the antibiotic when she gets home from the day of surgery. Patient understood and agreed with the plan.

## 2020-04-15 ENCOUNTER — Encounter (HOSPITAL_BASED_OUTPATIENT_CLINIC_OR_DEPARTMENT_OTHER)
Admission: RE | Admit: 2020-04-15 | Discharge: 2020-04-15 | Disposition: A | Discharge status: Home / Self Care | Payer: Managed Care, Other (non HMO) | Source: Ambulatory Visit | Attending: Surgery | Admitting: Surgery

## 2020-04-15 ENCOUNTER — Other Ambulatory Visit (HOSPITAL_COMMUNITY)
Admission: RE | Admit: 2020-04-15 | Discharge: 2020-04-15 | Disposition: A | Discharge status: Home / Self Care | Payer: Managed Care, Other (non HMO) | Source: Ambulatory Visit | Attending: Surgery | Admitting: Surgery

## 2020-04-15 DIAGNOSIS — Z01812 Encounter for preprocedural laboratory examination: Secondary | ICD-10-CM | POA: Insufficient documentation

## 2020-04-15 DIAGNOSIS — Z01810 Encounter for preprocedural cardiovascular examination: Secondary | ICD-10-CM | POA: Insufficient documentation

## 2020-04-15 DIAGNOSIS — Z20822 Contact with and (suspected) exposure to covid-19: Secondary | ICD-10-CM | POA: Insufficient documentation

## 2020-04-15 LAB — BASIC METABOLIC PANEL
Anion gap: 5 (ref 5–15)
BUN: 13 mg/dL (ref 6–20)
CO2: 27 mmol/L (ref 22–32)
Calcium: 8.8 mg/dL — ABNORMAL LOW (ref 8.9–10.3)
Chloride: 106 mmol/L (ref 98–111)
Creatinine, Ser: 0.65 mg/dL (ref 0.44–1.00)
GFR, Estimated: >60 mL/min (ref >60)
Glucose, Bld: 141 mg/dL — ABNORMAL HIGH (ref 70–99)
Potassium: 4 mmol/L (ref 3.5–5.1)
Sodium: 138 mmol/L (ref 135–145)

## 2020-04-15 LAB — SARS CORONAVIRUS 2 (TAT 6-24 HRS): SARS Coronavirus 2: NEGATIVE

## 2020-04-15 MED ORDER — ENSURE PRE-SURGERY PO LIQD: 296.0000 mL | Freq: Once | ORAL | Status: DC

## 2020-04-15 NOTE — Progress Notes (Signed)

## 2020-04-18 NOTE — H&P (Signed)
   Lindsay Rios  Location: [facility] Surgery Patient #: [id number] DOB: 1966-01-14 Married / Language: English / Race: White Female   History of Present Illness  The patient is a 55 year old female who presents with a complaint of Breast problems.  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 Because of her strong family history of breast cancer she was sent to the high risk clinic. She has since had further MRIs and in December had another biopsy of the right breast showing ADH. She has further calcifications and now at least needs 3 other areas in the right breast biopsy.  It is been determined that her lifetime risk of breast cancer is now 32% at least.   Allergies  Sulfa Antibiotics  Nausea.  Medication History  Aspirin (81MG  Tablet, Oral) Active. ZyrTEC (10MG  Tablet, Oral) Active. Vitamin D3 (10 MCG(400 UNIT) Tablet Chewable, Oral) Active. Dicyclomine HCl (20MG  Tablet, Oral) Active. Lisinopril-hydroCHLOROthiazide (20-12.5MG  Tablet, Oral) Active. Victoza (18MG /3ML Soln Pen-inj, Subcutaneous) Active. Lisinopril-hydroCHLOROthiazide (20-25MG  Tablet, Oral) Active. Vitamin D3 (1.25 MG(50000 UT) Tablet, Oral) Active. Jardiance (25MG  Tablet, Oral) Active. Basaglar KwikPen (100UNIT/ML Soln Pen-inj, Subcutaneous) Active. Medications Reconciled    Physical Exam  The physical exam findings are as follows: Note: On exam, she appears well  Her breasts are normal in appearance. There is minimal ecchymosis from her recent biopsy. There are no palpable masses and no axillary adenopathy    Assessment & Plan   ATYPICAL DUCTAL HYPERPLASIA OF RIGHT BREAST (N60.91)  Impression: I reviewed her most recent mammograms and MRI.  Again, she has increasing areas of atypical ductal hyperplasia of the right breast.  She has a strong family history of breast cancer in her lifetime risk is now greater than 32% of developing breast cancer I discussed this with her in detail. She would either need more areas biopsied and I suspect this will continue her lifetime versus proceeding with bilateral nipple sparing mastectomies. Given she has such high risk, I would recommend mastectomies. She is in agreement with this. I will refer her to plastic surgery for their opinion and we can discuss further proceeding with surgery.  Addendum: She has seen plastic surgery and we will proceed with bilateral nipple sparing mastectomy. We discussed the risks which includes but is not limited to bleeding, infection, injury to surrounding structures, flap failure, nipple necrosis, finding of malignancy, the need for other procedures, cardiopulmonary issues, etc.  She agrees to proceed.

## 2020-04-19 ENCOUNTER — Observation Stay (HOSPITAL_BASED_OUTPATIENT_CLINIC_OR_DEPARTMENT_OTHER)
Admission: RE | Admit: 2020-04-19 | Discharge: 2020-04-20 | Disposition: A | Discharge status: Home / Self Care | Payer: Managed Care, Other (non HMO) | Attending: Plastic Surgery | Admitting: Plastic Surgery

## 2020-04-19 ENCOUNTER — Ambulatory Visit (HOSPITAL_BASED_OUTPATIENT_CLINIC_OR_DEPARTMENT_OTHER): Payer: Managed Care, Other (non HMO) | Admitting: Anesthesiology

## 2020-04-19 ENCOUNTER — Other Ambulatory Visit: Payer: Self-pay

## 2020-04-19 ENCOUNTER — Encounter (HOSPITAL_BASED_OUTPATIENT_CLINIC_OR_DEPARTMENT_OTHER): Payer: Self-pay | Admitting: Surgery

## 2020-04-19 ENCOUNTER — Encounter (HOSPITAL_BASED_OUTPATIENT_CLINIC_OR_DEPARTMENT_OTHER)
Admission: RE | Disposition: A | Discharge status: Home / Self Care | Payer: Self-pay | Source: Home / Self Care | Attending: Plastic Surgery

## 2020-04-19 DIAGNOSIS — N6021 Fibroadenosis of right breast: Secondary | ICD-10-CM | POA: Diagnosis not present

## 2020-04-19 DIAGNOSIS — Z7984 Long term (current) use of oral hypoglycemic drugs: Secondary | ICD-10-CM | POA: Diagnosis not present

## 2020-04-19 DIAGNOSIS — Z7982 Long term (current) use of aspirin: Secondary | ICD-10-CM | POA: Insufficient documentation

## 2020-04-19 DIAGNOSIS — N6032 Fibrosclerosis of left breast: Secondary | ICD-10-CM | POA: Insufficient documentation

## 2020-04-19 DIAGNOSIS — C50911 Malignant neoplasm of unspecified site of right female breast: Principal | ICD-10-CM | POA: Insufficient documentation

## 2020-04-19 DIAGNOSIS — Z79899 Other long term (current) drug therapy: Secondary | ICD-10-CM | POA: Insufficient documentation

## 2020-04-19 DIAGNOSIS — C50919 Malignant neoplasm of unspecified site of unspecified female breast: Secondary | ICD-10-CM | POA: Diagnosis present

## 2020-04-19 DIAGNOSIS — I10 Essential (primary) hypertension: Secondary | ICD-10-CM | POA: Insufficient documentation

## 2020-04-19 DIAGNOSIS — I2510 Atherosclerotic heart disease of native coronary artery without angina pectoris: Secondary | ICD-10-CM | POA: Insufficient documentation

## 2020-04-19 DIAGNOSIS — Z421 Encounter for breast reconstruction following mastectomy: Secondary | ICD-10-CM | POA: Diagnosis not present

## 2020-04-19 DIAGNOSIS — N6022 Fibroadenosis of left breast: Secondary | ICD-10-CM | POA: Diagnosis not present

## 2020-04-19 DIAGNOSIS — E119 Type 2 diabetes mellitus without complications: Secondary | ICD-10-CM | POA: Diagnosis not present

## 2020-04-19 DIAGNOSIS — N6081 Other benign mammary dysplasias of right breast: Secondary | ICD-10-CM | POA: Diagnosis present

## 2020-04-19 HISTORY — PX: NIPPLE SPARING MASTECTOMY: SHX6537

## 2020-04-19 HISTORY — PX: BREAST RECONSTRUCTION WITH PLACEMENT OF TISSUE EXPANDER AND FLEX HD (ACELLULAR HYDRATED DERMIS): SHX6295

## 2020-04-19 LAB — GLUCOSE, CAPILLARY
Glucose-Capillary: 149 mg/dL — ABNORMAL HIGH (ref 70–99)
Glucose-Capillary: 171 mg/dL — ABNORMAL HIGH (ref 70–99)
Glucose-Capillary: 263 mg/dL — ABNORMAL HIGH (ref 70–99)

## 2020-04-19 SURGERY — MASTECTOMY, NIPPLE SPARING
Anesthesia: General | Site: Breast | Laterality: Bilateral

## 2020-04-19 MED ORDER — DEXAMETHASONE SODIUM PHOSPHATE 4 MG/ML IJ SOLN
INTRAMUSCULAR | Status: DC | PRN
  Administered 2020-04-19: 5 mg via INTRAVENOUS

## 2020-04-19 MED ORDER — ACETAMINOPHEN 325 MG PO TABS: 650.0000 mg | ORAL_TABLET | ORAL | Status: DC | PRN

## 2020-04-19 MED ORDER — OXYCODONE HCL 5 MG PO TABS
5.0000 mg | ORAL_TABLET | ORAL | Status: DC | PRN
  Administered 2020-04-19 (×2): 5 mg via ORAL
  Filled 2020-04-19: qty 1

## 2020-04-19 MED ORDER — SODIUM CHLORIDE 0.9% FLUSH
3.0000 mL | INTRAVENOUS | Status: DC | PRN
Start: 2020-04-19 — End: 2020-04-20

## 2020-04-19 MED ORDER — GABAPENTIN 300 MG PO CAPS
300.0000 mg | ORAL_CAPSULE | ORAL | Status: AC
  Administered 2020-04-19: 300 mg via ORAL

## 2020-04-19 MED ORDER — POLYETHYLENE GLYCOL 3350 17 G PO PACK: 17.0000 g | PACK | Freq: Every day | ORAL | Status: DC | PRN

## 2020-04-19 MED ORDER — BACITRACIN ZINC 500 UNIT/GM EX OINT
TOPICAL_OINTMENT | CUTANEOUS | Status: AC
  Filled 2020-04-19: qty 0.9

## 2020-04-19 MED ORDER — SCOPOLAMINE 1 MG/3DAYS TD PT72
MEDICATED_PATCH | TRANSDERMAL | Status: AC
  Filled 2020-04-19: qty 1

## 2020-04-19 MED ORDER — FENTANYL CITRATE (PF) 100 MCG/2ML IJ SOLN
INTRAMUSCULAR | Status: AC
  Filled 2020-04-19: qty 2

## 2020-04-19 MED ORDER — ZOLPIDEM TARTRATE 5 MG PO TABS: 5.0000 mg | ORAL_TABLET | Freq: Every evening | ORAL | Status: DC | PRN

## 2020-04-19 MED ORDER — CHLORHEXIDINE GLUCONATE CLOTH 2 % EX PADS: 6.0000 | MEDICATED_PAD | Freq: Once | CUTANEOUS | Status: DC

## 2020-04-19 MED ORDER — ACETAMINOPHEN 500 MG PO TABS
ORAL_TABLET | ORAL | Status: AC
  Filled 2020-04-19: qty 2

## 2020-04-19 MED ORDER — ONDANSETRON HCL 4 MG/2ML IJ SOLN
INTRAMUSCULAR | Status: AC
  Filled 2020-04-19: qty 2

## 2020-04-19 MED ORDER — CEFAZOLIN SODIUM-DEXTROSE 2-4 GM/100ML-% IV SOLN
2.0000 g | INTRAVENOUS | Status: AC
  Administered 2020-04-19: 2 g via INTRAVENOUS

## 2020-04-19 MED ORDER — SODIUM CHLORIDE 0.9 % IV SOLN: 250.0000 mL | INTRAVENOUS | Status: DC | PRN

## 2020-04-19 MED ORDER — AMISULPRIDE (ANTIEMETIC) 5 MG/2ML IV SOLN: 10.0000 mg | Freq: Once | INTRAVENOUS | Status: DC | PRN

## 2020-04-19 MED ORDER — SENNA 8.6 MG PO TABS
1.0000 | ORAL_TABLET | Freq: Two times a day (BID) | ORAL | Status: DC
  Administered 2020-04-19: 8.6 mg via ORAL
  Filled 2020-04-19: qty 1

## 2020-04-19 MED ORDER — HYDROMORPHONE HCL 1 MG/ML IJ SOLN
0.2500 mg | INTRAMUSCULAR | Status: DC | PRN
  Administered 2020-04-19 (×3): 0.25 mg via INTRAVENOUS

## 2020-04-19 MED ORDER — FENTANYL CITRATE (PF) 100 MCG/2ML IJ SOLN: 25.0000 ug | INTRAMUSCULAR | Status: DC | PRN

## 2020-04-19 MED ORDER — CEFAZOLIN SODIUM-DEXTROSE 2-4 GM/100ML-% IV SOLN
2.0000 g | Freq: Three times a day (TID) | INTRAVENOUS | Status: DC
  Administered 2020-04-19 – 2020-04-20 (×2): 2 g via INTRAVENOUS
  Filled 2020-04-19 (×2): qty 100

## 2020-04-19 MED ORDER — DIPHENHYDRAMINE HCL 12.5 MG/5ML PO ELIX: 12.5000 mg | ORAL_SOLUTION | Freq: Four times a day (QID) | ORAL | Status: DC | PRN

## 2020-04-19 MED ORDER — DEXAMETHASONE SODIUM PHOSPHATE 10 MG/ML IJ SOLN
INTRAMUSCULAR | Status: AC
  Filled 2020-04-19: qty 1

## 2020-04-19 MED ORDER — PROPOFOL 10 MG/ML IV BOLUS
INTRAVENOUS | Status: AC
  Filled 2020-04-19: qty 20

## 2020-04-19 MED ORDER — ROCURONIUM BROMIDE 10 MG/ML (PF) SYRINGE
PREFILLED_SYRINGE | INTRAVENOUS | Status: AC
  Filled 2020-04-19: qty 10

## 2020-04-19 MED ORDER — PROPOFOL 10 MG/ML IV BOLUS
INTRAVENOUS | Status: DC | PRN
  Administered 2020-04-19: 200 mg via INTRAVENOUS

## 2020-04-19 MED ORDER — DIAZEPAM 2 MG PO TABS
2.0000 mg | ORAL_TABLET | Freq: Two times a day (BID) | ORAL | Status: DC | PRN
  Administered 2020-04-19: 2 mg via ORAL
  Filled 2020-04-19: qty 1

## 2020-04-19 MED ORDER — KCL IN DEXTROSE-NACL 20-5-0.45 MEQ/L-%-% IV SOLN: INTRAVENOUS | Status: DC

## 2020-04-19 MED ORDER — MEPERIDINE HCL 25 MG/ML IJ SOLN: 6.2500 mg | INTRAMUSCULAR | Status: DC | PRN

## 2020-04-19 MED ORDER — MIDAZOLAM HCL 5 MG/5ML IJ SOLN
INTRAMUSCULAR | Status: DC | PRN
  Administered 2020-04-19: 2 mg via INTRAVENOUS

## 2020-04-19 MED ORDER — ONDANSETRON 4 MG PO TBDP: 4.0000 mg | ORAL_TABLET | Freq: Four times a day (QID) | ORAL | Status: DC | PRN

## 2020-04-19 MED ORDER — LIDOCAINE 2% (20 MG/ML) 5 ML SYRINGE
INTRAMUSCULAR | Status: AC
  Filled 2020-04-19: qty 5

## 2020-04-19 MED ORDER — PHENYLEPHRINE HCL (PRESSORS) 10 MG/ML IV SOLN
INTRAVENOUS | Status: DC | PRN
  Administered 2020-04-19 (×3): 80 ug via INTRAVENOUS

## 2020-04-19 MED ORDER — OXYCODONE HCL 5 MG PO TABS
5.0000 mg | ORAL_TABLET | Freq: Once | ORAL | Status: DC | PRN
Start: 2020-04-19 — End: 2020-04-20
  Filled 2020-04-19: qty 1

## 2020-04-19 MED ORDER — ONDANSETRON HCL 4 MG/2ML IJ SOLN: 4.0000 mg | Freq: Four times a day (QID) | INTRAMUSCULAR | Status: DC | PRN

## 2020-04-19 MED ORDER — CEFAZOLIN SODIUM-DEXTROSE 2-4 GM/100ML-% IV SOLN
INTRAVENOUS | Status: AC
  Filled 2020-04-19: qty 100

## 2020-04-19 MED ORDER — GABAPENTIN 300 MG PO CAPS
ORAL_CAPSULE | ORAL | Status: AC
  Filled 2020-04-19: qty 1

## 2020-04-19 MED ORDER — MIDAZOLAM HCL 2 MG/2ML IJ SOLN
INTRAMUSCULAR | Status: AC
  Filled 2020-04-19: qty 2

## 2020-04-19 MED ORDER — HYDROMORPHONE HCL 1 MG/ML IJ SOLN
INTRAMUSCULAR | Status: AC
  Filled 2020-04-19: qty 0.5

## 2020-04-19 MED ORDER — SCOPOLAMINE 1 MG/3DAYS TD PT72
MEDICATED_PATCH | TRANSDERMAL | Status: DC | PRN
  Administered 2020-04-19: 1 via TRANSDERMAL

## 2020-04-19 MED ORDER — SODIUM CHLORIDE 0.9 % IV SOLN: INTRAVENOUS | Status: DC | PRN

## 2020-04-19 MED ORDER — BUPIVACAINE HCL (PF) 0.25 % IJ SOLN
INTRAMUSCULAR | Status: AC
  Filled 2020-04-19: qty 30

## 2020-04-19 MED ORDER — DIPHENHYDRAMINE HCL 50 MG/ML IJ SOLN: 12.5000 mg | Freq: Four times a day (QID) | INTRAMUSCULAR | Status: DC | PRN

## 2020-04-19 MED ORDER — IBUPROFEN 200 MG PO TABS
400.0000 mg | ORAL_TABLET | Freq: Four times a day (QID) | ORAL | Status: DC
  Administered 2020-04-19 – 2020-04-20 (×3): 400 mg via ORAL
  Filled 2020-04-19 (×3): qty 2

## 2020-04-19 MED ORDER — LIDOCAINE HCL (PF) 1 % IJ SOLN
INTRAMUSCULAR | Status: AC
  Filled 2020-04-19: qty 30

## 2020-04-19 MED ORDER — ACETAMINOPHEN 10 MG/ML IV SOLN
1000.0000 mg | Freq: Once | INTRAVENOUS | Status: AC
  Administered 2020-04-19: 1000 mg via INTRAVENOUS

## 2020-04-19 MED ORDER — CEFAZOLIN SODIUM-DEXTROSE 2-4 GM/100ML-% IV SOLN: 2.0000 g | INTRAVENOUS | Status: AC

## 2020-04-19 MED ORDER — LACTATED RINGERS IV SOLN: INTRAVENOUS | Status: DC

## 2020-04-19 MED ORDER — ACETAMINOPHEN 500 MG PO TABS
1000.0000 mg | ORAL_TABLET | ORAL | Status: AC
  Administered 2020-04-19: 1000 mg via ORAL

## 2020-04-19 MED ORDER — ACETAMINOPHEN 325 MG RE SUPP: 650.0000 mg | RECTAL | Status: DC | PRN

## 2020-04-19 MED ORDER — PROMETHAZINE HCL 25 MG/ML IJ SOLN: 6.2500 mg | INTRAMUSCULAR | Status: DC | PRN

## 2020-04-19 MED ORDER — LIDOCAINE HCL (CARDIAC) PF 100 MG/5ML IV SOSY
PREFILLED_SYRINGE | INTRAVENOUS | Status: DC | PRN
  Administered 2020-04-19: 50 mg via INTRAVENOUS

## 2020-04-19 MED ORDER — FENTANYL CITRATE (PF) 100 MCG/2ML IJ SOLN
INTRAMUSCULAR | Status: DC | PRN
  Administered 2020-04-19 (×4): 25 ug via INTRAVENOUS
  Administered 2020-04-19 (×2): 50 ug via INTRAVENOUS
  Administered 2020-04-19 (×2): 25 ug via INTRAVENOUS

## 2020-04-19 MED ORDER — OXYCODONE HCL 5 MG PO TABS: 5.0000 mg | ORAL_TABLET | ORAL | Status: DC | PRN

## 2020-04-19 MED ORDER — HYDROMORPHONE HCL 1 MG/ML IJ SOLN: 1.0000 mg | INTRAMUSCULAR | Status: DC | PRN

## 2020-04-19 MED ORDER — OXYCODONE HCL 5 MG/5ML PO SOLN: 5.0000 mg | Freq: Once | ORAL | Status: DC | PRN

## 2020-04-19 MED ORDER — SODIUM CHLORIDE 0.9% FLUSH: 3.0000 mL | Freq: Two times a day (BID) | INTRAVENOUS | Status: DC

## 2020-04-19 MED ORDER — ACETAMINOPHEN 325 MG PO TABS
325.0000 mg | ORAL_TABLET | Freq: Four times a day (QID) | ORAL | Status: DC
  Administered 2020-04-19: 325 mg via ORAL
  Filled 2020-04-19 (×2): qty 1

## 2020-04-19 MED ORDER — EPINEPHRINE PF 1 MG/ML IJ SOLN
INTRAMUSCULAR | Status: AC
  Filled 2020-04-19: qty 1

## 2020-04-19 MED ORDER — ONDANSETRON HCL 4 MG/2ML IJ SOLN
INTRAMUSCULAR | Status: DC | PRN
  Administered 2020-04-19: 4 mg via INTRAVENOUS

## 2020-04-19 MED ORDER — ACETAMINOPHEN 10 MG/ML IV SOLN
INTRAVENOUS | Status: AC
  Filled 2020-04-19: qty 100

## 2020-04-19 SURGICAL SUPPLY — 81 items
APPLIER CLIP 9.375 MED OPEN (MISCELLANEOUS) ×2
BAG DECANTER FOR FLEXI CONT (MISCELLANEOUS) ×2 IMPLANT
BINDER BREAST LRG (GAUZE/BANDAGES/DRESSINGS) IMPLANT
BINDER BREAST MEDIUM (GAUZE/BANDAGES/DRESSINGS) IMPLANT
BINDER BREAST XLRG (GAUZE/BANDAGES/DRESSINGS) ×1 IMPLANT
BINDER BREAST XXLRG (GAUZE/BANDAGES/DRESSINGS) IMPLANT
BIOPATCH RED 1 DISK 7.0 (GAUZE/BANDAGES/DRESSINGS) ×3 IMPLANT
BLADE HEX COATED 2.75 (ELECTRODE) ×2 IMPLANT
BLADE SURG 15 STRL LF DISP TIS (BLADE) ×2 IMPLANT
BLADE SURG 15 STRL SS (BLADE) ×4
BNDG GAUZE ELAST 4 BULKY (GAUZE/BANDAGES/DRESSINGS) ×2 IMPLANT
CANISTER SUCT 1200ML W/VALVE (MISCELLANEOUS) ×2 IMPLANT
CHLORAPREP W/TINT 26 (MISCELLANEOUS) ×2 IMPLANT
CLIP APPLIE 9.375 MED OPEN (MISCELLANEOUS) ×1 IMPLANT
COVER BACK TABLE 60X90IN (DRAPES) ×2 IMPLANT
COVER MAYO STAND STRL (DRAPES) ×2 IMPLANT
COVER SURGICAL LIGHT HANDLE (MISCELLANEOUS) ×1 IMPLANT
COVER WAND RF STERILE (DRAPES) IMPLANT
DECANTER SPIKE VIAL GLASS SM (MISCELLANEOUS) IMPLANT
DERMABOND ADVANCED (GAUZE/BANDAGES/DRESSINGS) ×1
DERMABOND ADVANCED .7 DNX12 (GAUZE/BANDAGES/DRESSINGS) ×1 IMPLANT
DRAIN CHANNEL 19F RND (DRAIN) ×4 IMPLANT
DRAPE LAPAROSCOPIC ABDOMINAL (DRAPES) ×2 IMPLANT
DRAPE UTILITY XL STRL (DRAPES) ×2 IMPLANT
DRSG OPSITE POSTOP 4X6 (GAUZE/BANDAGES/DRESSINGS) ×2 IMPLANT
DRSG PAD ABDOMINAL 8X10 ST (GAUZE/BANDAGES/DRESSINGS) ×5 IMPLANT
DRSG TEGADERM 2-3/8X2-3/4 SM (GAUZE/BANDAGES/DRESSINGS) ×2 IMPLANT
ELECT BLADE 4.0 EZ CLEAN MEGAD (MISCELLANEOUS) ×4
ELECT REM PT RETURN 9FT ADLT (ELECTROSURGICAL) ×2
ELECTRODE BLDE 4.0 EZ CLN MEGD (MISCELLANEOUS) ×1 IMPLANT
ELECTRODE REM PT RTRN 9FT ADLT (ELECTROSURGICAL) ×1 IMPLANT
EVACUATOR SILICONE 100CC (DRAIN) ×4 IMPLANT
FUNNEL KELLER 2 DISP (MISCELLANEOUS) IMPLANT
GLOVE SURG ENC MOIS LTX SZ6.5 (GLOVE) ×4 IMPLANT
GLOVE SURG SIGNA 7.5 PF LTX (GLOVE) ×3 IMPLANT
GOWN STRL REUS W/ TWL LRG LVL3 (GOWN DISPOSABLE) ×3 IMPLANT
GOWN STRL REUS W/ TWL XL LVL3 (GOWN DISPOSABLE) ×1 IMPLANT
GOWN STRL REUS W/TWL LRG LVL3 (GOWN DISPOSABLE) ×8
GOWN STRL REUS W/TWL XL LVL3 (GOWN DISPOSABLE) ×2
GRAFT FLEX HD 6X16 PLIABLE (Tissue) ×2 IMPLANT
HEMOSTAT SURGICEL 2X14 (HEMOSTASIS) IMPLANT
IMPL EXPANDER BREAST 535CC (Breast) IMPLANT
IMPLANT BREAST 535CC (Breast) ×2 IMPLANT
IMPLANT EXPANDER BREAST 535CC (Breast) ×2 IMPLANT
IV NS 1000ML (IV SOLUTION)
IV NS 1000ML BAXH (IV SOLUTION) IMPLANT
IV NS 500ML (IV SOLUTION) ×2
IV NS 500ML BAXH (IV SOLUTION) ×1 IMPLANT
KIT FILL SYSTEM UNIVERSAL (SET/KITS/TRAYS/PACK) IMPLANT
LIGHT WAVEGUIDE WIDE FLAT (MISCELLANEOUS) ×1 IMPLANT
NDL HYPO 25X1 1.5 SAFETY (NEEDLE) ×1 IMPLANT
NEEDLE HYPO 25X1 1.5 SAFETY (NEEDLE) ×2 IMPLANT
NS IRRIG 1000ML POUR BTL (IV SOLUTION) ×2 IMPLANT
PACK BASIN DAY SURGERY FS (CUSTOM PROCEDURE TRAY) ×2 IMPLANT
PAD FOAM SILICONE BACKED (GAUZE/BANDAGES/DRESSINGS) IMPLANT
PENCIL SMOKE EVACUATOR (MISCELLANEOUS) ×2 IMPLANT
PIN SAFETY STERILE (MISCELLANEOUS) ×2 IMPLANT
SLEEVE SCD COMPRESS KNEE MED (STOCKING) ×2 IMPLANT
SPONGE LAP 18X18 RF (DISPOSABLE) ×6 IMPLANT
SPONGE LAP 4X18 RFD (DISPOSABLE) ×3 IMPLANT
STAPLER VISISTAT 35W (STAPLE) IMPLANT
STRIP SUTURE WOUND CLOSURE 1/2 (MISCELLANEOUS) IMPLANT
SUT ETHILON 2 0 FS 18 (SUTURE) ×1 IMPLANT
SUT MNCRL AB 4-0 PS2 18 (SUTURE) ×3 IMPLANT
SUT MON AB 3-0 SH 27 (SUTURE) ×8
SUT MON AB 3-0 SH27 (SUTURE) ×1 IMPLANT
SUT MON AB 5-0 PS2 18 (SUTURE) ×3 IMPLANT
SUT PDS 3-0 CT2 (SUTURE)
SUT PDS AB 2-0 CT2 27 (SUTURE) ×7 IMPLANT
SUT PDS II 3-0 CT2 27 ABS (SUTURE) IMPLANT
SUT SILK 3 0 PS 1 (SUTURE) ×3 IMPLANT
SUT VIC AB 3-0 SH 27 (SUTURE)
SUT VIC AB 3-0 SH 27X BRD (SUTURE) ×1 IMPLANT
SYR BULB EAR ULCER 3OZ GRN STR (SYRINGE) ×2 IMPLANT
SYR BULB IRRIG 60ML STRL (SYRINGE) ×2 IMPLANT
SYR CONTROL 10ML LL (SYRINGE) ×2 IMPLANT
TOWEL GREEN STERILE FF (TOWEL DISPOSABLE) ×4 IMPLANT
TRAY DSU PREP LF (CUSTOM PROCEDURE TRAY) ×1 IMPLANT
TUBE CONNECTING 20X1/4 (TUBING) ×2 IMPLANT
UNDERPAD 30X36 HEAVY ABSORB (UNDERPADS AND DIAPERS) ×4 IMPLANT
YANKAUER SUCT BULB TIP NO VENT (SUCTIONS) ×2 IMPLANT

## 2020-04-19 NOTE — Anesthesia Procedure Notes (Signed)
Procedure Name: LMA Insertion Date/Time: 04/19/2020 8:43 AM Performed by: Bufford Spikes, CRNA Pre-anesthesia Checklist: Patient identified, Emergency Drugs available, Suction available and Patient being monitored Patient Re-evaluated:Patient Re-evaluated prior to induction Oxygen Delivery Method: Circle system utilized Preoxygenation: Pre-oxygenation with 100% oxygen Induction Type: IV induction Ventilation: Mask ventilation without difficulty LMA: LMA inserted LMA Size: 3.0 Number of attempts: 1 Placement Confirmation: positive ETCO2 Tube secured with: Tape Dental Injury: Teeth and Oropharynx as per pre-operative assessment

## 2020-04-19 NOTE — Discharge Instructions (Addendum)
INSTRUCTIONS FOR AFTER SURGERY   You will likely have some questions about what to expect following your operation.  The following information will help you and your family understand what to expect when you are discharged from the hospital.  Following these guidelines will help ensure a smooth recovery and reduce risks of complications.  Postoperative instructions include information on: diet, wound care, medications and physical activity.  AFTER SURGERY Expect to go home after the procedure.  In some cases, you may need to spend one night in the hospital for observation.  DIET This surgery does not require a specific diet.  However, I have to mention that the healthier you eat the better your body can start healing. It is important to increasing your protein intake.  This means limiting the foods with added sugar.  Focus on fruits and vegetables and some meat. It is very important to drink water after your surgery.  If your urine is bright yellow, then it is concentrated, and you need to drink more water.  As a general rule after surgery, you should have 8 ounces of water every hour while awake.  If you find you are persistently nauseated or unable to take in liquids let us know.  NO TOBACCO USE or EXPOSURE.  This will slow your healing process and increase the risk of a wound.  WOUND CARE If you have a drain: Clean with baby wipes for 3-5 days and then you can shower.  If you have a binder you may remove it to shower and then put it back on. If you have steri-strips / tape directly attached to your skin leave them in place. It is OK to get these wet.  No baths, pools or hot tubs for two weeks. We close your incision to leave the smallest and best-looking scar. No ointment or creams on your incisions until given the go ahead.  Especially not Neosporin (Too many skin reactions with this one).  A few weeks after surgery you can use Mederma and start massaging the scar. We ask you to wear your binder or  sports bra for the first 6 weeks around the clock, including while sleeping. This provides added comfort and helps reduce the fluid accumulation at the surgery site.  ACTIVITY No heavy lifting until cleared by the doctor.  It is OK to walk and climb stairs. In fact, moving your legs is very important to decrease your risk of a blood clot.  It will also help keep you from getting deconditioned.  Every 1 to 2 hours get up and walk for 5 minutes. This will help with a quicker recovery back to normal.  Let pain be your guide so you don't do too much.  NO, you cannot do the spring cleaning and don't plan on taking care of anyone else.  This is your time for TLC.   WORK Everyone returns to work at different times. As a rough guide, most people take at least 1 - 2 weeks off prior to returning to work. If you need documentation for your job, bring the forms to your postoperative follow up visit.  DRIVING Arrange for someone to bring you home from the hospital.  You may be able to drive a few days after surgery but not while taking any narcotics or valium.  BOWEL MOVEMENTS Constipation can occur after anesthesia and while taking pain medication.  It is important to stay ahead for your comfort.  We recommend taking Milk of Magnesia (2 tablespoons; twice   a day) while taking the pain pills.  SEROMA This is fluid your body tried to put in the surgical site.  This is normal but if it creates excessive pain and swelling let us know.  It usually decreases in a few weeks.  MEDICATIONS and PAIN CONTROL At your preoperative visit for you history and physical you were given the following medications: 1. An antibiotic: Start this medication when you get home and take according to the instructions on the bottle. 2. Zofran 4 mg:  This is to treat nausea and vomiting.  You can take this every 6 hours as needed and only if needed. 3. Norco (hydrocodone/acetaminophen) 5/325 mg:  This is only to be used after you have  taken the motrin or the tylenol. Every 8 hours as needed. Over the counter Medication to take: 4. Ibuprofen (Motrin) 600 mg:  Take this every 6 hours.  If you have additional pain then take 500 mg of the tylenol.  Only take the Norco after you have tried these two. 5. Miralax or stool softener of choice: Take this according to the bottle if you take the Norco.  WHEN TO CALL Call your surgeon's office if any of the following occur: . Fever 101 degrees F or greater . Excessive bleeding or fluid from the incision site. . Pain that increases over time without aid from the medications . Redness, warmth, or pus draining from incision sites . Persistent nausea or inability to take in liquids . Severe misshapen area that underwent the operation.  [facility]  What is the benefit of having a drain?  During surgery your tissue layers are separated.  This raw surface stimulates your body to fill the space with serous fluid.  This is normal but you don't want that fluid to collect and prevent healing.  A fluid collection can also become infected.  The Jackson-Pratt (JP) drain is used to eliminate this collection of fluid and allow the tissue to heal together.    Jackson-Pratt (JP) bulb    How to care for your drainage and suction unit at home Your drainage catheter will be connected to a collection device. The vacuum caused when the device is compressed allows drainage to collect in the device.    . Wash your hands with soap and water before and after touching the system. . Empty the JP drain every 12 hours once you get home from your procedure. . Record the fluid amount on the record sheet included. . Start with stripping the drain tube to push the clots or excess fluid to the bulb.  Do this by pinching the tube with one hand near your skin.  Then with the other hand squeeze the tubing and work it toward the bulb.  This should be done several times a day.  This may collapse the  tube which will correct on its own.   . Use a safety pin to attach your collection device to your clothing so there is no tension on the insertion site.   . If you have drainage at the skin insertion site, you can apply a gauze dressing and secure it with tape. . If the drain falls out, apply a gauze dressing over the drain insertion site and secure with tape.   To empty the collection device:   . Release the stopper on the top of the collection unit (bulb).  . Pour contents into a measuring container such as a plastic medicine cup.  . Record the   day and amount of drainage on the attached sheet. . This should be done at least twice a day.    To compress the Jackson-Pratt Bulb:  . Release the stopper at the top of the bulb. . Squeeze the bulb tightly in your fist, squeezing air out of the bulb.  . Replace the stopper while the bulb is compressed.  . Be careful not to spill the contents when squeezing the bulb. . The drainage will start bright red and turn to pink and then yellow with time. . IMPORTANT: If the bulb is not squeezed before adding the stopper it will not draw out the fluid.  Care for the JP drain site and your skin daily:  . You may shower three days after surgery. . Secure the drain to a ribbon or cloth around your waist while showering so it does not pull out while showering. . Be sure your hands are cleaned with soap and water. . Use a clean wet cotton swab to clean the skin around the drain site.  . Use another cotton swab to place Vaseline or antibiotic ointment on the skin around the drain.     Contact your physician if any of the following occur:  . The fluid in the bulb becomes cloudy. . Your temperature is greater than 101.4.  . The incision opens. . If you have drainage at the skin insertion site, you can apply a gauze dressing and secure it with tape. . If the drain falls out, apply a gauze dressing over the drain insertion site and secure with tape.  . You will  usually have more drainage when you are active than while you rest or are asleep. If the drainage increases significantly or is bloody call the physician                             Bring this record with you to each office visit Date  Drainage Volume  Date   Drainage volume                                                                                                                                                                                          

## 2020-04-19 NOTE — Anesthesia Postprocedure Evaluation (Signed)
Anesthesia Post Note  Patient: Lindsay Rios  Procedure(s) Performed: BILATERAL NIPPLE SPARING MASTECTOMY (Bilateral Breast) IMMEDIATE BILATERAL BREAST RECONSTRUCTION WITH PLACEMENT OF TISSUE EXPANDER AND FLEX HD (ACELLULAR HYDRATED DERMIS) (Bilateral Breast)     Patient location during evaluation: PACU Anesthesia Type: General Level of consciousness: awake and alert Pain management: pain level controlled Vital Signs Assessment: post-procedure vital signs reviewed and stable Respiratory status: spontaneous breathing, nonlabored ventilation and respiratory function stable Cardiovascular status: blood pressure returned to baseline and stable Postop Assessment: no apparent nausea or vomiting Anesthetic complications: no   No complications documented.  Last Vitals:  Vitals:   04/19/20 1300 04/19/20 1315  BP: 114/75 106/83  Pulse: 88 93  Resp: 16 16  Temp:    SpO2: 97% 97%    Last Pain:  Vitals:   04/19/20 1315  TempSrc:   PainSc: Asleep                 Lynda Rainwater

## 2020-04-19 NOTE — Op Note (Signed)
Op report    DATE OF OPERATION:  04/19/2020  LOCATION: [place]  SURGICAL DIVISION: Plastic Surgery  PREOPERATIVE DIAGNOSES:  1. Right Breast cancer.    POSTOPERATIVE DIAGNOSES:  1. Right Breast cancer.   PROCEDURE:  1. Bilateral immediate breast reconstruction with placement of Acellular Dermal Matrix and tissue expanders.  SURGEON: Jazlen Ogarro Sanger Jacora Hopkins, DO  ASSISTANT: Roetta Sessions, PA  ANESTHESIA:  General.   COMPLICATIONS: None.   IMPLANTS: Left - Mentor 535 cc. Ref #[id number].  Serial Number [id number], 200 cc of injectable saline placed in the expander. Right - Mentor 535 cc. Ref #[id number].  Serial Number [id number], 200 cc of injectable saline placed in the expander. Acellular Dermal Matrix 6 x 16 cm two Flex HD  INDICATIONS FOR PROCEDURE:  The patient, Lindsay Rios, is a 55 y.o. female born on [birth year], is here for  immediate first stage breast reconstruction with placement of bilateral tissue expander and Acellular dermal matrix. MRN: [id number]  CONSENT:  Informed consent was obtained directly from the patient. Risks, benefits and alternatives were fully discussed. Specific risks including but not limited to bleeding, infection, hematoma, seroma, scarring, pain, implant infection, implant extrusion, capsular contracture, asymmetry, wound healing problems, and need for further surgery were all discussed. The patient did have an ample opportunity to have her questions answered to her satisfaction.   DESCRIPTION OF PROCEDURE:  The patient was taken to the operating room by the general surgery team. SCDs were placed and IV antibiotics were given. The patient's chest was prepped and draped in a sterile fashion. A time out was performed and the implants to be used were identified.  Bilateral mastectomies were performed.  Once the general surgery team had completed their portion of the case the patient was rendered to the plastic and  reconstructive surgery team.  Right:  The pectoralis major muscle was lifted from the chest wall with release of the lateral edge and lateral inframammary fold.  The pocket was irrigated with antibiotic solution and hemostasis was achieved with electrocautery.  The ADM was then prepared according to the manufacture guidelines and slits placed to help with postoperative fluid management.  The ADM was then sutured to the inferior and lateral edge of the inframammary fold with 2-0 PDS starting with an interrupted stitch and then a running stitch.  The lateral portion was sutured to with interrupted sutures after the expander was placed.  The expander was prepared according to the manufacture guidelines, the air evacuated and then it was placed under the ADM and pectoralis major muscle.  The inferior and lateral tabs were used to secure the expander to the chest wall with 2-0 PDS.  The drain was placed at the inframammary fold over the ADM and secured to the skin with 3-0 Silk.  The deep layers were closed with 3-0 Monocryl followed by 4-0 Monocryl.  The skin was closed with 5-0 Monocryl.  Left:  The pectoralis major muscle was lifted from the chest wall with release of the lateral edge and lateral inframammary fold.  The pocket was irrigated with antibiotic solution and hemostasis was achieved with electrocautery.  The ADM was then prepared according to the manufacture guidelines and slits placed to help with postoperative fluid management.  The ADM was then sutured to the inferior and lateral edge of the inframammary fold with 2-0 PDS starting with an interrupted stitch and then a running stitch.  The lateral portion was sutured to with interrupted sutures after the expander was placed.  The expander was prepared according to the manufacture guidelines, the air evacuated and then it was placed under the ADM and pectoralis major muscle.  The inferior and lateral tabs were used to secure the expander to the chest  wall with 2-0 PDS.  The drain was placed at the inframammary fold over the ADM and secured to the skin with 3-0 Silk.    The deep layers were closed with 3-0 Monocryl followed by 4-0 Monocryl.  The skin was closed with 5-0 Monocryl and then dermabond was applied.  The ABDs and breast binder were placed.  The patient tolerated the procedure well and there were no complications.  The patient was allowed to wake from anesthesia and taken to the recovery room in satisfactory condition.   The advanced practice practitioner (APP) assisted throughout the case.  The APP was essential in retraction and counter traction when needed to make the case progress smoothly.  This retraction and assistance made it possible to see the tissue plans for the procedure.  The assistance was needed for blood control, tissue re-approximation and assisted with closure of the incision site.

## 2020-04-19 NOTE — Anesthesia Preprocedure Evaluation (Signed)
Anesthesia Evaluation  Patient identified by MRN, date of birth, ID band Patient awake    Reviewed: Allergy & Precautions, H&P , NPO status , Patient's Chart, lab work & pertinent test results  Airway Mallampati: III  TM Distance: >3 FB Neck ROM: Full    Dental no notable dental hx. (+) Teeth Intact, Dental Advisory Given   Pulmonary sleep apnea and Continuous Positive Airway Pressure Ventilation ,    Pulmonary exam normal breath sounds clear to auscultation       Cardiovascular hypertension, Pt. on medications  Rhythm:Regular Rate:Normal     Neuro/Psych Depression negative neurological ROS     GI/Hepatic negative GI ROS, Neg liver ROS,   Endo/Other  diabetes, Insulin Dependent, Oral Hypoglycemic Agents  Renal/GU negative Renal ROS  negative genitourinary   Musculoskeletal  (+) Arthritis , Osteoarthritis,    Abdominal   Peds  Hematology negative hematology ROS (+)   Anesthesia Other Findings   Reproductive/Obstetrics negative OB ROS                             Anesthesia Physical  Anesthesia Plan  ASA: III  Anesthesia Plan: General   Post-op Pain Management:    Induction: Intravenous  PONV Risk Score and Plan: 3 and Ondansetron, Midazolam, Dexamethasone and Treatment may vary due to age or medical condition  Airway Management Planned: LMA  Additional Equipment:   Intra-op Plan:   Post-operative Plan: Extubation in OR  Informed Consent: I have reviewed the patients History and Physical, chart, labs and discussed the procedure including the risks, benefits and alternatives for the proposed anesthesia with the patient or authorized representative who has indicated his/her understanding and acceptance.     Dental advisory given  Plan Discussed with: CRNA  Anesthesia Plan Comments:         Anesthesia Quick Evaluation

## 2020-04-19 NOTE — Transfer of Care (Signed)
Immediate Anesthesia Transfer of Care Note  Patient: Korayma L. Lasseter  Procedure(s) Performed: BILATERAL NIPPLE SPARING MASTECTOMY (Bilateral Breast) IMMEDIATE BILATERAL BREAST RECONSTRUCTION WITH PLACEMENT OF TISSUE EXPANDER AND FLEX HD (ACELLULAR HYDRATED DERMIS) (Bilateral Breast)  Patient Location: PACU  Anesthesia Type:General  Level of Consciousness: awake, alert  and oriented  Airway & Oxygen Therapy: Patient Spontanous Breathing and Patient connected to nasal cannula oxygen  Post-op Assessment: Report given to RN and Post -op Vital signs reviewed and stable  Post vital signs: Reviewed and stable  Last Vitals:  Vitals Value Taken Time  BP 151/87 04/19/20 1133  Temp    Pulse 99 04/19/20 1139  Resp 19 04/19/20 1139  SpO2 94 % 04/19/20 1139  Vitals shown include unvalidated device data.  Last Pain:  Vitals:   04/19/20 0700  TempSrc: Oral  PainSc: 0-No pain      Patients Stated Pain Goal: 5 (66/29/47 6546)  Complications: No complications documented.

## 2020-04-19 NOTE — Op Note (Signed)
BILATERAL NIPPLE SPARING MASTECTOMY  Procedure Note  Lindsay Rios 04/19/2020   Pre-op Diagnosis: RECURRENT BREAST ATYPICAL DUCTAL HYPERPLASIA; HIGH LONG TERM RISK OF CANCER     Post-op Diagnosis: same  Procedure(s): BILATERAL NIPPLE [place] MASTECTOMY   Surgeon : Lindsay Keens, MD  Assistant: Carlena Hurl, PA  Anesthesia: General  Staff:  Circulator: McDonough-Hughes, Delene Ruffini, RN Scrub Person: Jackie Plum  Estimated Blood Loss: Minimal               Specimens: sent to path  Indications: This is a 55 year old female who has had a right breast lumpectomy and multiple biopsies in the right breast showing multifocal atypical ductal hyperplasia.  She has a greater than 30% lifetime risk of developing breast cancer.  Given this, the decision was made to proceed with bilateral nipple sparing mastectomies.  Procedure: The patient was brought to operating room identifies correct patient.  She was placed prone on the operating table and general anesthesia was induced.  Her chest and breast were prepped and draped in usual sterile fashion.  I made an inframammary incision of the right breast with a scalpel.  I then dissected down of the breast tissue electrocautery.  I then took the dissection down to the chest wall.  I next started elevating the skin flap working superiorly toward the nipple areolar complex.  Reach the level of the nipple and entered a seroma from the previous biopsy site.  I did a biopsy of this area and there was atypical ductal hyperplasia but no evidence of malignancy.  I then took the tissue all the way up to the level of the nipple with cautery.  I then continue to work superiorly remove the breast tissue from the overlying skin flap with the cautery.  I took the dissection both medially and laterally and then dissected down toward the chest wall.  I then dissected medial to lateral taking the breast tissue off of the pectoralis fascia with the cautery.  I then  completed the mastectomy with the rest of the tissue toward the axilla.  The breast specimen was then sent to pathology as the right breast.  Hemostasis was achieved with cautery as well as several surgical clips in the upper outer quadrant of the breast. Next I turned my attention toward the left breast.  A similar inframammary incision was made with a scalpel.  Again this was taken down to the chest wall with the cautery.  I then created the skin flap staying on top of the breast tissue and underneath the skin and dermis with the cautery working superiorly toward the nipple areolar complex.  Once I reached the nipple areolar complex I again performed a frozen section biopsy on the nipple which showed normal breast tissue without evidence of malignancy.  I then continued dissection up past the nipple areolar complex removing the superior portion of the breast.  I took the dissection both medial laterally with electrocautery staying underneath the skin flaps.  I then took dissection down toward the chest wall medially, superiorly, and laterally.  I again then started taking the breast off the chest wall going superior to inferior this time staying off the pectoralis fascia.  I then completed mastectomy removing the left breast.  This breast mass was also sent to pathology for evaluation.  I next achieved hemostasis with cautery.  At this point Dr. Marla Roe was in the room.  Hemostasis peer to be achieved.  She then began her immediate reconstruction.  At  this point all counts were correct at my portion of the procedure.          Lindsay Rios   Date: 04/19/2020  Time: 10:21 AM

## 2020-04-19 NOTE — Interval H&P Note (Signed)
History and Physical Interval Note:no change in H and P  04/19/2020 8:14 AM  Lindsay Rios  has presented today for surgery, with the diagnosis of RECURRENT BREAST ATYPICAL DUCTAL HYPERPLASIA; HIGH LONG TERM RISK OF CANCER.  The various methods of treatment have been discussed with the patient and family. After consideration of risks, benefits and other options for treatment, the patient has consented to  Procedure(s) with comments: BILATERAL NIPPLE SPARING MASTECTOMY (Bilateral) - LMA; START TIME OF 8:30 AM FOR 120 MINUTES IN ROOM 8; COORD W/ [place]-NEEDS 90 MINUTES IMMEDIATE BILATERAL BREAST RECONSTRUCTION WITH PLACEMENT OF TISSUE EXPANDER AND FLEX HD (ACELLULAR HYDRATED DERMIS) (Bilateral) as a surgical intervention.  The patient's history has been reviewed, patient examined, no change in status, stable for surgery.  I have reviewed the patient's chart and labs.  Questions were answered to the patient's satisfaction.     Coralie Keens

## 2020-04-19 NOTE — Interval H&P Note (Signed)
History and Physical Interval Note:  04/19/2020 7:00 AM  Lindsay Rios  has presented today for surgery, with the diagnosis of RECURRENT BREAST ATYPICAL DUCTAL HYPERPLASIA; HIGH LONG TERM RISK OF CANCER.  The various methods of treatment have been discussed with the patient and family. After consideration of risks, benefits and other options for treatment, the patient has consented to  Procedure(s) with comments: BILATERAL NIPPLE SPARING MASTECTOMY (Bilateral) - LMA; START TIME OF 8:30 AM FOR 120 MINUTES IN ROOM 8; COORD W/ Natalyah Cummiskey-NEEDS 90 MINUTES IMMEDIATE BILATERAL BREAST RECONSTRUCTION WITH PLACEMENT OF TISSUE EXPANDER AND FLEX HD (ACELLULAR HYDRATED DERMIS) (Bilateral) as a surgical intervention.  The patient's history has been reviewed, patient examined, no change in status, stable for surgery.  I have reviewed the patient's chart and labs.  Questions were answered to the patient's satisfaction.     Loel Lofty Brock Mokry

## 2020-04-20 ENCOUNTER — Encounter (HOSPITAL_BASED_OUTPATIENT_CLINIC_OR_DEPARTMENT_OTHER): Payer: Self-pay | Admitting: Surgery

## 2020-04-20 DIAGNOSIS — C50911 Malignant neoplasm of unspecified site of right female breast: Secondary | ICD-10-CM | POA: Diagnosis not present

## 2020-04-20 NOTE — Discharge Summary (Signed)
Physician Discharge Summary  Patient ID: Lindsay Rios MRN: [id number] DOB/AGE: 07-11-1965 55 y.o.  Admit date: 04/19/2020 Discharge date: 04/20/2020  Admission Diagnoses:  Discharge Diagnoses:  Active Problems:   Breast cancer [facility])   Discharged Condition: good  Hospital Course: Patient is a 55 year old female who underwent bilateral immediate breast reconstruction placement of tissue expanders and Flex HD after bilateral mastectomies yesterday.  She stayed overnight at [facility] surgical center for observation.  She reports overnight she did well, nursing staff reported no acute overnight events.  Patient's husband is at bedside and she is ready for discharge.  Consults: None  Significant Diagnostic Studies: None  Treatments: Oxycodone, Valium, ibuprofen, bilateral breast reconstruction placement tissue expanders and Flex HD  Discharge Exam: Blood pressure 115/71, pulse 96, temperature 97.8 F (36.6 C), resp. rate 16, height 5\' 3"  (1.6 m), weight 69.8 kg, SpO2 97 %. General appearance: alert, cooperative, no distress and Resting in bed, husband at bedside, nursing staff at bedside Resp: Unlabored Breasts: Status post bilateral mastectomies, honeycomb dressings and Steri-Strips in place.  She has some dusky appearance of bilateral mastectomy flaps with the right being worse than the left.  There is no necrosis noted.  No skin sloughing is noted.  No erythema or cellulitic changes are noted.  No fluid collections or subcutaneous fluid noted with palpation.  Bilateral JP drains in place with serosanguineous drainage noted.   Disposition: Discharge disposition: 01-Home or Self Care       Discharge Instructions    Call MD for:  difficulty breathing, headache or visual disturbances   Complete by: As directed    Call MD for:  extreme fatigue   Complete by: As directed    Call MD for:  hives   Complete by: As directed    Call MD for:  persistant dizziness or  light-headedness   Complete by: As directed    Call MD for:  persistant nausea and vomiting   Complete by: As directed    Call MD for:  redness, tenderness, or signs of infection (pain, swelling, redness, odor or green/yellow discharge around incision site)   Complete by: As directed    Call MD for:  severe uncontrolled pain   Complete by: As directed    Call MD for:  temperature >100.4   Complete by: As directed    Diet - low sodium heart healthy   Complete by: As directed    Increase activity slowly   Complete by: As directed      Allergies as of 04/20/2020      Reactions   Other Nausea Only, Other (See Comments)   REACTION: nausea   Sulfa Antibiotics Nausea Only   REACTION: nausea      Medication List    TAKE these medications   ALIVE WOMENS GUMMY PO Chew one (1) gummy by mouth daily.   aspirin 81 MG chewable tablet Chew 81 mg by mouth daily.   Basaglar KwikPen 100 UNIT/ML Inject 34 Units into the skin daily.   cetirizine 10 MG tablet Commonly known as: ZYRTEC Take 10 mg by mouth daily.   diazepam 2 MG tablet Commonly known as: Valium Take 1 tablet (2 mg total) by mouth every 12 (twelve) hours as needed for muscle spasms.   glycopyrrolate 2 MG tablet Commonly known as: ROBINUL Take 1 tablet (2 mg total) by mouth 2 (two) times daily.   IMODIUM PO Take 1-2 tablets by mouth daily.   Jardiance 25 MG Tabs tablet Generic drug:  empagliflozin Take 25 mg by mouth daily.   lisdexamfetamine 20 MG capsule Commonly known as: VYVANSE Take 20 mg by mouth daily.   lisinopril 10 MG tablet Commonly known as: ZESTRIL Take 1 tablet (10 mg total) by mouth daily.   nitroGLYCERIN 0.4 MG SL tablet Commonly known as: NITROSTAT Place 1 tablet (0.4 mg total) under the tongue every 5 (five) minutes as needed for chest pain.   ondansetron 4 MG tablet Commonly known as: Zofran Take 1 tablet (4 mg total) by mouth every 8 (eight) hours as needed for nausea or vomiting.   PA  VITAMIN D-3 GUMMY PO Chew one (1) gummy by mouth daily.   rosuvastatin 20 MG tablet Commonly known as: CRESTOR Take 20 mg by mouth daily.   tamoxifen 10 MG tablet Commonly known as: NOLVADEX tamoxifen 10 mg tablet  Take 1 tablet twice a day by oral route.   Victoza 18 MG/3ML Sopn Generic drug: liraglutide Inject 0.8 mg into the skin every morning.       Follow-up Information    Dillingham, Loel Lofty, DO In 10 days.   Specialty: Plastic Surgery Contact information: [address] [place] [address] [phone]        Coralie Keens, MD In 2 weeks.   Specialty: General Surgery Contact information: [place] [address] [facility] [address] [place], [place] [address] [phone]  Signed: Carola Rhine  04/20/2020, 7:31 AM

## 2020-04-23 LAB — SURGICAL PATHOLOGY

## 2020-04-27 ENCOUNTER — Encounter: Payer: Self-pay | Admitting: Plastic Surgery

## 2020-04-27 ENCOUNTER — Ambulatory Visit (INDEPENDENT_AMBULATORY_CARE_PROVIDER_SITE_OTHER): Payer: Managed Care, Other (non HMO) | Admitting: Plastic Surgery

## 2020-04-27 ENCOUNTER — Other Ambulatory Visit: Payer: Self-pay

## 2020-04-27 VITALS — BP 142/83 | HR 111

## 2020-04-27 DIAGNOSIS — C50919 Malignant neoplasm of unspecified site of unspecified female breast: Secondary | ICD-10-CM

## 2020-04-27 DIAGNOSIS — Z9013 Acquired absence of bilateral breasts and nipples: Secondary | ICD-10-CM

## 2020-04-27 DIAGNOSIS — Z9010 Acquired absence of unspecified breast and nipple: Secondary | ICD-10-CM | POA: Insufficient documentation

## 2020-04-27 NOTE — Progress Notes (Signed)
The patient is a 55 year old female here for follow-up after undergoing bilateral mastectomies that were nipple sparing with placement of expanders 04/19/20.  There does not appear to be any seroma or hematoma.  No sign of infection.  She has quite a bit of bruising particularly on the right side around the nipple areola.  She has [place] 535 cc expanders with 200 cc in place.  We placed injectable saline in the Expander using a sterile technique: Right: 50 cc for a total of 250 / 535 cc Left: 50 cc for a total of 250 / 535 cc

## 2020-05-04 ENCOUNTER — Other Ambulatory Visit: Payer: Self-pay

## 2020-05-04 ENCOUNTER — Ambulatory Visit (INDEPENDENT_AMBULATORY_CARE_PROVIDER_SITE_OTHER): Payer: Managed Care, Other (non HMO) | Admitting: Surgical

## 2020-05-04 ENCOUNTER — Encounter: Payer: Self-pay | Admitting: Surgical

## 2020-05-04 VITALS — BP 152/95 | HR 93

## 2020-05-04 DIAGNOSIS — N6091 Unspecified benign mammary dysplasia of right breast: Secondary | ICD-10-CM

## 2020-05-04 DIAGNOSIS — C50919 Malignant neoplasm of unspecified site of unspecified female breast: Secondary | ICD-10-CM

## 2020-05-04 DIAGNOSIS — Z9013 Acquired absence of bilateral breasts and nipples: Secondary | ICD-10-CM

## 2020-05-04 NOTE — Progress Notes (Signed)
Patient is a 55 year old female here for follow-up after bilateral nipple sparing mastectomy followed by bilateral breast reconstruction placement of tissue expanders.  She is 2 weeks postop.  Patient reports she is overall well.  She is not having any infectious symptoms.  She reports she has little bit of tenderness, but reports she was more active recently while helping around her father's house.  She has had approximately 10 to 15 cc of output per 24 hours from each JP drain.  Chaperone present on exam On exam bilateral breast incisions are intact, right NAC has some superficial sloughing and scabbing noted.  It does not appear necrotic.  There is no foul odor.  There is no surrounding cellulitic changes.  The bilateral breast incisions are intact.  Steri-Strips are in place.  JP drains are in place  We placed injectable saline in the Expander using a sterile technique: Right: 50 cc for a total of 300/ 535 cc Left: 50 cc for a total of 300/ 535 cc  There is no sign of infection, seroma, hematoma.  She does have some asymmetry with the left side being larger at this time, she has a history of lumpectomies to the right breast which is likely causing this asymmetry at this time.  I discussed with her we would fill accordingly to adjust.  Recommend continue her compressive garment.  Avoid strenuous activity.  Bilateral JP drains removed due to low output.  Recommend Vaseline and gauze daily to the drain site wounds.

## 2020-05-05 ENCOUNTER — Ambulatory Visit: Payer: Managed Care, Other (non HMO) | Admitting: Podiatry

## 2020-05-06 ENCOUNTER — Other Ambulatory Visit: Payer: Self-pay | Admitting: Surgical

## 2020-05-18 ENCOUNTER — Other Ambulatory Visit: Payer: Self-pay

## 2020-05-18 ENCOUNTER — Encounter: Payer: Self-pay | Admitting: Surgical

## 2020-05-18 ENCOUNTER — Ambulatory Visit (INDEPENDENT_AMBULATORY_CARE_PROVIDER_SITE_OTHER): Payer: Managed Care, Other (non HMO) | Admitting: Surgical

## 2020-05-18 VITALS — BP 163/89 | HR 98

## 2020-05-18 DIAGNOSIS — C50919 Malignant neoplasm of unspecified site of unspecified female breast: Secondary | ICD-10-CM

## 2020-05-18 DIAGNOSIS — Z9013 Acquired absence of bilateral breasts and nipples: Secondary | ICD-10-CM

## 2020-05-18 MED ORDER — DOXYCYCLINE HYCLATE 100 MG PO TABS: 100.0000 mg | ORAL_TABLET | Freq: Two times a day (BID) | ORAL | 0 refills | Status: AC

## 2020-05-18 NOTE — Progress Notes (Addendum)
Patient is a 55 year old female here for follow-up on her bilateral nipple sparing mastectomy followed by bilateral breast reconstruction placement tissue expanders.  She is 1 month postop.  She presents today for concerns of redness and pain.  She reports she has not had any infectious symptoms.  She reports that she has not had any fevers chills or nausea or vomiting.  She reports that her husband noticed the area was red yesterday.  She reports some tenderness to her lateral breasts, reports a pulling sensation.  On exam she has some erythema of the left breast.  She also has some swelling of bilateral breast with some subcutaneous fluid collections palpated.  Bilateral NAC's are viable with good color.  No wounds are noted.  No drainage or foul odors are noted.  We were able to aspirate 45 cc of serosanguineous fluid from the right breast.  We aspirated 90 cc of serosanguineous fluid from the left breast.  It did not appear cloudy or concerning for infection.  We then injected 50 cc of saline in the right tissue expander using a sterile technique. Right: 50 cc for a total of 350/535 cc Left: 0 cc for a total of 300/535 cc  Prescription for doxycycline sent to pharmacy.  Low threshold to treat given she has expanders in place.  She has a follow-up scheduled for 1 week for reevaluation.  We discussed calling if her symptoms worsen or change.

## 2020-05-19 ENCOUNTER — Ambulatory Visit: Payer: Managed Care, Other (non HMO) | Admitting: Surgical

## 2020-05-21 ENCOUNTER — Telehealth: Payer: Self-pay

## 2020-05-21 NOTE — Telephone Encounter (Signed)
Returned patients call. Advised her we will see her at her next appointment on 05/25/20. She started her Doxycycline last Tuesday, has been alternating Tylenol with Advil for pain. Can use a warm compress for comfort. Patient understood and agreed with plan.

## 2020-05-21 NOTE — Telephone Encounter (Signed)
Patient called to say that it looks like she has fluid in her right breast and she is wondering if she needs to be seen today or if she should wait until her next appointment on 05/25/2020.  Patient said that she just saw [place] on Tuesday of this week and we drew fluid off both of her breasts at that time.  Please call.

## 2020-05-24 ENCOUNTER — Telehealth: Payer: Self-pay | Admitting: *Deleted

## 2020-05-24 NOTE — Telephone Encounter (Signed)
Received call from pt stating she stopped taking Tamoxifen April 19, 2020 due to severe bilateral hand and feet cramping.  Pt states symptoms have resolved since stopping Tamoxifen.  Pt requesting to be seen by MD for possible change in therapy.  Pt states she believes she is in menopause now and has recently received lab work with [place] which showed menopause status.  MD visit scheduled and RN will contact [facility] to obtain recent lab work.

## 2020-05-25 ENCOUNTER — Other Ambulatory Visit: Payer: Self-pay

## 2020-05-25 ENCOUNTER — Ambulatory Visit (INDEPENDENT_AMBULATORY_CARE_PROVIDER_SITE_OTHER): Payer: Managed Care, Other (non HMO) | Admitting: Surgical

## 2020-05-25 ENCOUNTER — Encounter: Payer: Self-pay | Admitting: Surgical

## 2020-05-25 VITALS — BP 156/89 | HR 89

## 2020-05-25 DIAGNOSIS — Z9013 Acquired absence of bilateral breasts and nipples: Secondary | ICD-10-CM

## 2020-05-25 DIAGNOSIS — C50919 Malignant neoplasm of unspecified site of unspecified female breast: Secondary | ICD-10-CM

## 2020-05-25 MED ORDER — DOXYCYCLINE HYCLATE 100 MG PO TABS: 100.0000 mg | ORAL_TABLET | Freq: Two times a day (BID) | ORAL | 0 refills | Status: AC

## 2020-05-25 NOTE — Progress Notes (Signed)
Patient is a 55 year old female here for follow-up on her bilateral nipple sparing mastectomy followed by bilateral breast reconstruction on 04/19/2020.  She is 5 weeks postop.  She was last seen in the office on 05/18/2020 and fluid was aspirated from bilateral breasts.  Patient presents today for concern for additional swelling.  She reports that she feels as if the redness of her left breast has improved.  Chaperone present on exam On exam right breast has a positive fluid wave.  No fluid wave of left breast.  She does have some erythema of the left breast.  There is no thickening of the skin.  It does not appear cellulitic.  There is no increased tenderness of the left breast.   We placed injectable saline in the Expander using a sterile technique: Right: 50 cc for a total of 400 / 535 cc Left: 0 cc for a total of 300 / 535 cc  5 cc of cloudy serosanguineous drainage was aspirated from the left breast using sterile technique and being careful to not puncture to the expander.  Butterfly needle was injected just above the expander port for safety.  25 cc of clear serous fluid was aspirated from the right breast using a sterile technique.  Butterfly needle was injected just above the expander port for safety to prevent puncturing the expander.  I am going to send a culture of the left breast fluid to be evaluated for more specific coverage recommendations.  Recommend following up in 1 week for reevaluation.  Recommend calling with questions or concerns.

## 2020-05-26 NOTE — Progress Notes (Signed)
Patient Care Team: [name], [place] as PCP - General (Nurse Practitioner)  DIAGNOSIS:    ICD-10-CM   1. Atypical ductal hyperplasia of right breast  N60.91     CHIEF COMPLIANT: Follow-up for high risk for breast cancer, s/p bilateral mastectomies  INTERVAL HISTORY: Lindsay Rios is a 55 y.o. with above-mentioned history of high risk for breast cancer who was on risk reduction therapy with tamoxifen. Breast MRI on 01/18/20 showed clumped small masses in the right breast spanning 3.4cm. Biopsy on 01/22/20 showed atypical ductal hyperplasia. She underwent bilateral mastectomies on 04/19/20 with Dr. Ninfa Linden for which pathology showed in the right breast, atypical ductal and lobular hyperplasia, and in the left breast, complex sclerosing lesion atypical lobular hyperplasia. She presents to the clinic today for follow-up to discuss alternative antiestrogen therapy. She is recovering from recent bilateral mastectomies.  Still having some fluid buildup and is currently on antibiotics.  ALLERGIES:  is allergic to other and sulfa antibiotics.  MEDICATIONS:  Current Outpatient Medications  Medication Sig Dispense Refill  . aspirin 81 MG chewable tablet Chew 81 mg by mouth daily.    . cetirizine (ZYRTEC) 10 MG tablet Take 10 mg by mouth daily.    . Cholecalciferol (PA VITAMIN D-3 GUMMY PO) Chew one (1) gummy by mouth daily.    . diazepam (VALIUM) 2 MG tablet TAKE 1 TABLET(2 MG) BY MOUTH EVERY 12 HOURS AS NEEDED FOR MUSCLE SPASMS 20 tablet 0  . doxycycline (VIBRA-TABS) 100 MG tablet Take 1 tablet (100 mg total) by mouth 2 (two) times daily for 7 days. 14 tablet 0  . empagliflozin (JARDIANCE) 25 MG TABS tablet Take 25 mg by mouth daily.    Marland Kitchen glycopyrrolate (ROBINUL) 2 MG tablet Take 1 tablet (2 mg total) by mouth 2 (two) times daily. 180 tablet 3  . Insulin Glargine (BASAGLAR KWIKPEN) 100 UNIT/ML Inject 34 Units into the skin daily.    Marland Kitchen liraglutide (VICTOZA) 18 MG/3ML SOPN Inject 0.8 mg  into the skin every morning.    . lisdexamfetamine (VYVANSE) 20 MG capsule Take 20 mg by mouth daily.    Marland Kitchen lisinopril (ZESTRIL) 10 MG tablet Take 1 tablet (10 mg total) by mouth daily. 90 tablet 3  . Loperamide HCl (IMODIUM PO) Take 1-2 tablets by mouth daily.    . Multiple Vitamins-Minerals (ALIVE WOMENS GUMMY PO) Chew one (1) gummy by mouth daily.    . nitroGLYCERIN (NITROSTAT) 0.4 MG SL tablet Place 1 tablet (0.4 mg total) under the tongue every 5 (five) minutes as needed for chest pain. 30 tablet 12  . ondansetron (ZOFRAN) 4 MG tablet Take 1 tablet (4 mg total) by mouth every 8 (eight) hours as needed for nausea or vomiting. (Patient not taking: Reported on 05/25/2020) 20 tablet 0  . rosuvastatin (CRESTOR) 20 MG tablet Take 20 mg by mouth daily.    . tamoxifen (NOLVADEX) 10 MG tablet      Current Facility-Administered Medications  Medication Dose Route Frequency Provider Last Rate Last Admin  . 0.9 %  sodium chloride infusion  500 mL Intravenous Once Ladene Artist, MD        PHYSICAL EXAMINATION: ECOG PERFORMANCE STATUS: 1 - Symptomatic but completely ambulatory  Vitals:   05/27/20 1037  BP: (!) 162/89  Pulse: (!) 105  Resp: 18  Temp: 97.7 F (36.5 C)  SpO2: 97%   Filed Weights   05/27/20 1037  Weight: 153 lb 3.2 oz (69.5 kg)      LABORATORY DATA:  I have reviewed the data as listed CMP Latest Ref Rng & Units 04/15/2020 06/02/2019 04/12/2016  Glucose 70 - 99 mg/dL 141(H) 103(H) 189(H)  BUN 6 - 20 mg/dL 13 12 18   Creatinine 0.44 - 1.00 mg/dL 0.65 0.74 0.86  Sodium 135 - 145 mmol/L 138 139 135  Potassium 3.5 - 5.1 mmol/L 4.0 3.4(L) 3.0(L)  Chloride 98 - 111 mmol/L 106 101 97(L)  CO2 22 - 32 mmol/L 27 27 24   Calcium 8.9 - 10.3 mg/dL 8.8(L) 9.8 9.4  Total Protein 6.0 - 8.3 g/dL - - -  Total Bilirubin 0.2 - 1.2 mg/dL - - -  Alkaline Phos 39 - 117 U/L - - -  AST 0 - 37 U/L - - -  ALT 0 - 35 U/L - - -    Lab Results  Component Value Date   WBC 9.8 04/12/2016   HGB  16.0 (H) 04/12/2016   HCT 45.8 04/12/2016   MCV 83.6 04/12/2016   PLT 257 04/12/2016   NEUTROABS 6.4 08/27/2014    ASSESSMENT & PLAN:  Atypical ductal hyperplasia of right breast Right breast lumpectomy: 4:00 and 5:00 positions: Focal ADH with calcifications, fibrocystic change Prognosis:Using theTyer Cusick calculator, breast cancer risk: 10-year risk: 11% and lifetime risk 32% Prior treatment: Tamoxifen 20 mg daily started 07/03/2019 stopped 04/19/2020 (due to severe bilateral hand and feet cramping) 04/19/2020: Bilateral mastectomies with reconstruction: Right mastectomy: ADH, ALH; left mastectomy: CSL with ADH, ALH  Having had bilateral mastectomies there is no further role of antiestrogen therapy. There is also no role of further imaging studies. Return to clinic on an as-needed basis.  No orders of the defined types were placed in this encounter.  The patient has a good understanding of the overall plan. she agrees with it. she will call with any problems that may develop before the next visit here.  Total time spent: 20 mins including face to face time and time spent for planning, charting and coordination of care  Rulon Eisenmenger, MD, MPH 05/27/2020  I, Molly Dorshimer, am acting as scribe for Dr. Nicholas Lose.  I have reviewed the above documentation for accuracy and completeness, and I agree with the above.

## 2020-05-27 ENCOUNTER — Other Ambulatory Visit: Payer: Self-pay

## 2020-05-27 ENCOUNTER — Inpatient Hospital Stay: Payer: Managed Care, Other (non HMO) | Attending: Hematology and Oncology | Admitting: Hematology and Oncology

## 2020-05-27 DIAGNOSIS — Z7982 Long term (current) use of aspirin: Secondary | ICD-10-CM | POA: Insufficient documentation

## 2020-05-27 DIAGNOSIS — Z9013 Acquired absence of bilateral breasts and nipples: Secondary | ICD-10-CM | POA: Diagnosis not present

## 2020-05-27 DIAGNOSIS — Z79899 Other long term (current) drug therapy: Secondary | ICD-10-CM | POA: Diagnosis not present

## 2020-05-27 DIAGNOSIS — N6091 Unspecified benign mammary dysplasia of right breast: Secondary | ICD-10-CM | POA: Diagnosis not present

## 2020-05-27 NOTE — Assessment & Plan Note (Signed)
Right breast lumpectomy: 4:00 and 5:00 positions: Focal ADH with calcifications, fibrocystic change Prognosis:Using theTyer Cusick calculator, breast cancer risk: 10-year risk: 11% and lifetime risk 32% Prior treatment: Tamoxifen 20 mg daily started 07/03/2019 stopped 04/19/2020 (due to severe bilateral hand and feet cramping) 04/19/2020: Bilateral mastectomies with reconstruction: Right mastectomy: ADH, ALH; left mastectomy: CSL with ADH, ALH  Having had bilateral mastectomies there is no further role of antiestrogen therapy. There is also no role of further imaging studies. Return to clinic on an as-needed basis.

## 2020-05-28 ENCOUNTER — Telehealth: Payer: Self-pay

## 2020-05-28 NOTE — Telephone Encounter (Signed)
Patient called to check if the results were in for her left breast culture and would like a call back with an update.

## 2020-05-31 LAB — AEROBIC/ANAEROBIC CULTURE W GRAM STAIN (SURGICAL/DEEP WOUND): Culture: NO GROWTH

## 2020-05-31 NOTE — Telephone Encounter (Signed)
Called and spoke with the patient on (05/28/20) regarding the message below.  Informed the patient that I spoke with [facility], and he stated that some of the results are still pending, but what is completed looks good.  Patient verbalized understanding and agreed.//AB/CMA

## 2020-06-01 ENCOUNTER — Encounter: Payer: Self-pay | Admitting: Plastic Surgery

## 2020-06-01 ENCOUNTER — Ambulatory Visit (INDEPENDENT_AMBULATORY_CARE_PROVIDER_SITE_OTHER): Payer: Managed Care, Other (non HMO) | Admitting: Plastic Surgery

## 2020-06-01 ENCOUNTER — Other Ambulatory Visit: Payer: Self-pay

## 2020-06-01 DIAGNOSIS — Z9013 Acquired absence of bilateral breasts and nipples: Secondary | ICD-10-CM

## 2020-06-01 NOTE — Progress Notes (Signed)
The patient is a 55 year old female here for follow-up bilateral mastectomies.  They were nipple sparing.  She is doing really well.  I do not see a hematoma or seroma today.  Her micro came back negative.  She is pleased with her progress.  She would like to be a little larger.  We can start planning on her exchange surgery.  We placed injectable saline in the Expander using a sterile technique: Right: 50 cc for a total of 450 / 535 cc Left: 50 cc for a total of 350 / 535 cc

## 2020-06-22 ENCOUNTER — Other Ambulatory Visit: Payer: Self-pay

## 2020-06-22 ENCOUNTER — Ambulatory Visit (INDEPENDENT_AMBULATORY_CARE_PROVIDER_SITE_OTHER): Payer: Managed Care, Other (non HMO) | Admitting: Surgical

## 2020-06-22 DIAGNOSIS — Z9013 Acquired absence of bilateral breasts and nipples: Secondary | ICD-10-CM

## 2020-06-22 DIAGNOSIS — C50919 Malignant neoplasm of unspecified site of unspecified female breast: Secondary | ICD-10-CM

## 2020-06-22 DIAGNOSIS — N6091 Unspecified benign mammary dysplasia of right breast: Secondary | ICD-10-CM

## 2020-06-22 NOTE — Progress Notes (Signed)
Patient is a 55 year old female here for follow-up on her bilateral breast reconstruction. She is scheduled for removal of bilateral tissue expanders and placement of bilateral breast implants on 08/12/2020 with Dr. Marla Roe.  Patient reports she is doing well after her last fill.  She would like an additional fill  Chaperone present on exam On exam bilateral breast incisions are intact.  No erythema.  No subcutaneous fluid collections noted.  We placed injectable saline in the Expander using a sterile technique: Right: 50 cc for a total of 500/ 535 cc Left: 50 cc for a total of 400/ 535 cc  Pictures were taken and placed in the patient's chart with patient's permission.  Recommend following up in 2 weeks for reevaluation and additional fill. She has some asymmetry due to the fact that she had previous right breast lumpectomies prior to her mastectomy.  She never had any previous left breast procedures prior to the left mastectomy.

## 2020-07-07 ENCOUNTER — Other Ambulatory Visit: Payer: Self-pay

## 2020-07-07 ENCOUNTER — Ambulatory Visit (INDEPENDENT_AMBULATORY_CARE_PROVIDER_SITE_OTHER): Payer: Managed Care, Other (non HMO) | Admitting: Surgical

## 2020-07-07 DIAGNOSIS — C50919 Malignant neoplasm of unspecified site of unspecified female breast: Secondary | ICD-10-CM

## 2020-07-07 DIAGNOSIS — Z9013 Acquired absence of bilateral breasts and nipples: Secondary | ICD-10-CM

## 2020-07-07 NOTE — Progress Notes (Signed)
Patient 55 year old female here for follow-up on her bilateral breast reconstruction.  She is scheduled for removal of bilateral tissue expanders and placement of bilateral breast implants 08/12/2020 with Dr. Marla Roe.  Patient reports she is overall doing well.  She is interested in an additional fill.  She think she would like at least 1 more fill after today's as well.  Chaperone present on exam On exam bilateral breast incisions are intact.  Bilateral NAC's are viable.  No erythema.  No subcutaneous fluid collection noted.  Right breast is slightly smaller   We placed injectable saline in the Expander using a sterile technique: Right: 50 cc for a total of 550 / 535 cc Left: 0 cc for a total of 400 / 535 cc  After today's fill of the right breast she appears much more symmetric.  We will review photos with Dr. Marla Roe to discuss additional fill of bilateral breast.  We will also plan to order implants soon.  Recommend following up at her preop appointment and we can do 1 additional fill at that time.

## 2020-07-09 ENCOUNTER — Ambulatory Visit: Payer: Managed Care, Other (non HMO) | Admitting: Surgical

## 2020-07-22 ENCOUNTER — Other Ambulatory Visit: Payer: Self-pay

## 2020-07-22 ENCOUNTER — Encounter: Payer: Self-pay | Admitting: Surgical

## 2020-07-22 ENCOUNTER — Ambulatory Visit (INDEPENDENT_AMBULATORY_CARE_PROVIDER_SITE_OTHER): Payer: Managed Care, Other (non HMO) | Admitting: Surgical

## 2020-07-22 VITALS — BP 156/93 | HR 94 | Ht 62.0 in | Wt 157.6 lb

## 2020-07-22 DIAGNOSIS — E119 Type 2 diabetes mellitus without complications: Secondary | ICD-10-CM

## 2020-07-22 DIAGNOSIS — C50919 Malignant neoplasm of unspecified site of unspecified female breast: Secondary | ICD-10-CM

## 2020-07-22 DIAGNOSIS — Z9013 Acquired absence of bilateral breasts and nipples: Secondary | ICD-10-CM

## 2020-07-22 DIAGNOSIS — N6091 Unspecified benign mammary dysplasia of right breast: Secondary | ICD-10-CM

## 2020-07-22 MED ORDER — DIAZEPAM 2 MG PO TABS: 2.0000 mg | ORAL_TABLET | Freq: Two times a day (BID) | ORAL | 0 refills | Status: DC | PRN

## 2020-07-22 MED ORDER — HYDROCODONE-ACETAMINOPHEN 5-325 MG PO TABS: 1.0000 | ORAL_TABLET | Freq: Four times a day (QID) | ORAL | 0 refills | Status: AC | PRN

## 2020-07-22 MED ORDER — ONDANSETRON HCL 4 MG PO TABS: 4.0000 mg | ORAL_TABLET | Freq: Three times a day (TID) | ORAL | 0 refills | Status: DC | PRN

## 2020-07-22 MED ORDER — CEPHALEXIN 500 MG PO CAPS: 500.0000 mg | ORAL_CAPSULE | Freq: Four times a day (QID) | ORAL | 0 refills | Status: AC

## 2020-07-22 NOTE — H&P (View-Only) (Signed)
Patient ID: Lindsay Rios, female    DOB: 05-03-1965, 55 y.o.   MRN: [id number]  Chief Complaint  Patient presents with   Pre-op Exam       ICD-10-CM   1. Acquired absence of both breasts  Z90.13     2. Atypical ductal hyperplasia of right breast  N60.91     3. Malignant neoplasm of female breast, unspecified estrogen receptor status, unspecified laterality, unspecified site of breast ([place])  C50.919     4. Type 2 diabetes mellitus without complication, without long-term current use of insulin ([facility])  E11.9       History of Present Illness: Lindsay Rios is a 55 y.o.  female .  She presents for preoperative evaluation for upcoming procedure removal of bilateral tissue expanders and placement of bilateral silicone breast implants scheduled for 08/12/2020 with Dr. Marla Roe.  The patient has not had problems with anesthesia. No history of DVT/PE.  No family history of DVT/PE.  No family or personal history of bleeding or clotting disorders.  Patient is not currently taking any blood thinners.  No history of CVA/MI.   PMH Significant for: Type 2 diabetes mellitus, hyperlipidemia, hypertension.  History of OSA on CPAP.  She is on ASA 81 mg for history of coronary artery disease.  She is also currently on tamoxifen. Patient reports she is feeling well lately.  No recent changes in her health.  Currently in her bilateral tissue expander she has: Right: 550/535 cc. Left: 400/535 cc.   Past Medical History: Allergies: Allergies  Allergen Reactions   Other Nausea Only and Other (See Comments)    REACTION: nausea    Sulfa Antibiotics Nausea Only    REACTION: nausea    Current Medications:  Current Outpatient Medications:    aspirin 81 MG chewable tablet, Chew 81 mg by mouth daily., Disp: , Rfl:    cephALEXin (KEFLEX) 500 MG capsule, Take 1 capsule (500 mg total) by mouth 4 (four) times daily for 5 days., Disp: 20 capsule, Rfl: 0   cetirizine (ZYRTEC) 10 MG  tablet, Take 10 mg by mouth daily., Disp: , Rfl:    Cholecalciferol (PA VITAMIN D-3 GUMMY PO), Chew one (1) gummy by mouth daily., Disp: , Rfl:    diazepam (VALIUM) 2 MG tablet, TAKE 1 TABLET(2 MG) BY MOUTH EVERY 12 HOURS AS NEEDED FOR MUSCLE SPASMS, Disp: 20 tablet, Rfl: 0   diazepam (VALIUM) 2 MG tablet, Take 1 tablet (2 mg total) by mouth every 12 (twelve) hours as needed for muscle spasms., Disp: 10 tablet, Rfl: 0   empagliflozin (JARDIANCE) 25 MG TABS tablet, Take 25 mg by mouth daily., Disp: , Rfl:    glycopyrrolate (ROBINUL) 2 MG tablet, Take 1 tablet (2 mg total) by mouth 2 (two) times daily., Disp: 180 tablet, Rfl: 3   HYDROcodone-acetaminophen (NORCO) 5-325 MG tablet, Take 1 tablet by mouth every 6 (six) hours as needed for up to 5 days for severe pain., Disp: 20 tablet, Rfl: 0   Insulin Glargine (BASAGLAR KWIKPEN) 100 UNIT/ML, Inject 34 Units into the skin daily., Disp: , Rfl:    insulin lispro (HUMALOG) 100 UNIT/ML KwikPen, 4 units at lunch and 4 units at evening meal (titrate as directed, max daily dose of 50 units), Disp: , Rfl:    liraglutide (VICTOZA) 18 MG/3ML SOPN, Inject 0.8 mg into the skin every morning., Disp: , Rfl:    lisdexamfetamine (VYVANSE) 20 MG capsule, Take 20 mg by mouth daily., Disp: ,  Rfl:    lisinopril (ZESTRIL) 10 MG tablet, Take 1 tablet (10 mg total) by mouth daily., Disp: 90 tablet, Rfl: 3   Loperamide HCl (IMODIUM PO), Take 1-2 tablets by mouth daily., Disp: , Rfl:    Multiple Vitamins-Minerals (ALIVE WOMENS GUMMY PO), Chew one (1) gummy by mouth daily., Disp: , Rfl:    nitroGLYCERIN (NITROSTAT) 0.4 MG SL tablet, Place 1 tablet (0.4 mg total) under the tongue every 5 (five) minutes as needed for chest pain., Disp: 30 tablet, Rfl: 12   ondansetron (ZOFRAN) 4 MG tablet, Take 1 tablet (4 mg total) by mouth every 8 (eight) hours as needed for nausea or vomiting., Disp: 20 tablet, Rfl: 0   ondansetron (ZOFRAN) 4 MG tablet, Take 1 tablet (4 mg total) by mouth every 8  (eight) hours as needed for nausea or vomiting., Disp: 20 tablet, Rfl: 0   rosuvastatin (CRESTOR) 20 MG tablet, Take 20 mg by mouth daily., Disp: , Rfl:   Current Facility-Administered Medications:    0.9 %  sodium chloride infusion, 500 mL, Intravenous, Once, Ladene Artist, MD  Past Medical Problems: Past Medical History:  Diagnosis Date   ADD (attention deficit disorder)    Allergy    Depression    Ejection fraction    Gestational diabetes 1999; 2002   History of chicken pox    Human papilloma virus    High Risk    Hyperlipidemia    Hypertension    Numbness and tingling    Osteopenia    Pneumonia 2012   Sleep apnea with use of continuous positive airway pressure (CPAP)    Type II diabetes mellitus ([place])    "borderline; I'm on Metformin"    Past Surgical History: Past Surgical History:  Procedure Laterality Date   ANAL RECTAL MANOMETRY N/A 03/17/2015   Procedure: ANO RECTAL MANOMETRY;  Surgeon: Mauri Pole, MD;  Location: [facility];  Service: Endoscopy;  Laterality: N/A;   BREAST LUMPECTOMY WITH RADIOACTIVE SEED LOCALIZATION Right 06/05/2019   Procedure: [name];  Surgeon: Coralie Keens, MD;  Location: [facility];  Service: General;  Laterality: Right;   BREAST RECONSTRUCTION WITH PLACEMENT OF TISSUE EXPANDER AND FLEX HD (ACELLULAR HYDRATED DERMIS) Bilateral 04/19/2020   Procedure: IMMEDIATE BILATERAL BREAST RECONSTRUCTION WITH PLACEMENT OF TISSUE EXPANDER AND FLEX HD (ACELLULAR HYDRATED DERMIS);  Surgeon: Wallace Going, DO;  Location: [facility];  Service: Plastics;  Laterality: Bilateral;   LEFT HEART CATHETERIZATION WITH CORONARY ANGIOGRAM N/A 12/22/2013   Procedure: LEFT HEART CATHETERIZATION WITH CORONARY ANGIOGRAM;  Surgeon: Jettie Booze, MD;  Location: [facility]ATH [facility];  Service: Cardiovascular;  Laterality: N/A;   NIPPLE SPARING MASTECTOMY Bilateral 04/19/2020    Procedure: BILATERAL NIPPLE SPARING MASTECTOMY;  Surgeon: Coralie Keens, MD;  Location: [place];  Service: General;  Laterality: Bilateral;   SHOULDER ARTHROSCOPY W/ ROTATOR CUFF REPAIR Right 1990's   VAGINAL HYSTERECTOMY  2013   HPV/Cervical Dysplasia; partial    Social History: Social History   Socioeconomic History   Marital status: Married    Spouse name: [name] (Separated)    Number of children: 2   Years of education: 14   Highest education level: Not on file  Occupational History   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   Occupation: Full- Time Student    Education: [employer] ([place])    Tobacco Use: She has never smoked.     Alcohol Use:  Rarely   Drug Use:  None   Diet:  Regular   Exercise:  None   Hobbies: Shopping      Social Determinants of Health   Financial Resource Strain: Not on file  Food Insecurity: Not on file  Transportation Needs: Not on file  Physical Activity: Not on file  Stress: Not on file  Social Connections: Not on file  Intimate Partner Violence: Not on file    Family History: Family History  Problem Relation Age of Onset   Hyperlipidemia Mother    Hyperlipidemia Father    Hypertension Father    Diabetes Father    Heart disease Father    Stroke Father    Cancer Maternal Grandmother        Colon Cancer   Alzheimer's disease Maternal Grandmother    Colon cancer  Maternal Grandmother    Heart disease Brother 52       Myocardial Infarction   Breast cancer Paternal Aunt    Esophageal cancer Neg Hx    Stomach cancer Neg Hx    Rectal cancer Neg Hx     Review of Systems: Review of Systems  Constitutional: Negative.   Respiratory: Negative.    Cardiovascular: Negative.   Gastrointestinal: Negative.   Musculoskeletal: Negative.   Neurological: Negative.    Physical Exam: Vital Signs BP (!) 156/93 (BP Location: Right Arm, Patient Position: Sitting, Cuff Size: Normal)   Pulse 94   Ht 5\' 2"  (1.575 m)   Wt 157 lb 9.6 oz (71.5 kg)   SpO2 95%   BMI 28.83 kg/m   Physical Exam  Constitutional:      General: Not in acute distress.    Appearance: Normal appearance. Not ill-appearing.  HENT:     Head: Normocephalic and atraumatic.  Eyes:     Pupils: Pupils are equal, round Neck:     Musculoskeletal: Normal range of motion.  Cardiovascular:     Rate and Rhythm: Normal rate    Pulses: Normal pulses.  Pulmonary:     Effort: Pulmonary effort is normal. No respiratory distress.  Abdominal:     General: Abdomen is flat. There is no distension.  Musculoskeletal: Normal range of motion.  Skin:    General: Skin is warm and dry.     Findings: No erythema or rash.  Neurological:     General: No focal deficit present.     Mental Status: Alert and oriented to person, place, and time. Mental status is at baseline.     Motor: No weakness.  Psychiatric:        Mood and Affect: Mood normal.        Behavior: Behavior normal.    Assessment/Plan: The patient is scheduled for exchange of bilateral tissue expanders for bilateral breast implants with Dr. Marla Roe.  Risks, benefits, and alternatives of  procedure discussed, questions answered and consent obtained.    Smoking Status: Non-smoker; Counseling Given?  N/A  Caprini Score: 5, high; Risk Factors include: Age, BMI than 25, and length of planned surgery. Recommendation for mechanical and  pharmacological prophylaxis. Encourage early ambulation.   Pictures obtained: Today at consult  Post-op Rx sent to pharmacy: Valium, Norco, Zofran, Keflex  Patient was provided with the General Surgical Risk consent document and Pain Medication Agreement prior to their appointment.  They had adequate time to read through the risk consent documents and Pain Medication Agreement. We also discussed them in person together during this preop appointment. All of their questions were answered to their satisfaction.  Recommended calling if they have any further questions.  Risk consent form and Pain Medication Agreement to be scanned into patient's chart.  The risks that can be encountered with and after placement of a breast implant were discussed and include the following but not limited to these: bleeding, infection, delayed healing, anesthesia risks, skin sensation changes, injury to structures including nerves, blood vessels, and muscles which may be temporary or permanent, allergies to tape, suture materials and glues, blood products, topical preparations or injected agents, skin contour irregularities, skin discoloration and swelling, deep vein thrombosis, cardiac and pulmonary complications, pain, which may persist, fluid accumulation, wrinkling of the skin over the implanmt, changes in nipple or breast sensation, implant leakage or rupture, faulty position of the implant, persistent pain, formation of tight scar tissue around the implant (capsular contracture).  Patient was provided with the [facility] implant patient decision checklist and this was completed during today's preoperative evaluation. Patient had time to read through the information and any questions were answered to their content. Form will be scanned into patient's chart.  Pictures were obtained of the patient and placed in the chart with the patient's or guardian's permission.  Patient reports she is not currently taking aspirin.  She is  aware to hold this 5 to 7 days prior to surgery.   Electronically signed by: Carola Rhine Destine Ambroise, PA-C 07/22/2020 12:51 PM

## 2020-07-22 NOTE — Progress Notes (Signed)
Patient ID: Lindsay Rios, female    DOB: 17-Apr-1965, 55 y.o.   MRN: [id number]  Chief Complaint  Patient presents with   Pre-op Exam       ICD-10-CM   1. Acquired absence of both breasts  Z90.13     2. Atypical ductal hyperplasia of right breast  N60.91     3. Malignant neoplasm of female breast, unspecified estrogen receptor status, unspecified laterality, unspecified site of breast ([place])  C50.919     4. Type 2 diabetes mellitus without complication, without long-term current use of insulin (HCC)  E11.9       History of Present Illness: Lindsay Rios is a 55 y.o.  female .  She presents for preoperative evaluation for upcoming procedure removal of bilateral tissue expanders and placement of bilateral silicone breast implants scheduled for 08/12/2020 with Dr. Marla Roe.  The patient has not had problems with anesthesia. No history of DVT/PE.  No family history of DVT/PE.  No family or personal history of bleeding or clotting disorders.  Patient is not currently taking any blood thinners.  No history of CVA/MI.   PMH Significant for: Type 2 diabetes mellitus, hyperlipidemia, hypertension.  History of OSA on CPAP.  She is on ASA 81 mg for history of coronary artery disease.  She is also currently on tamoxifen. Patient reports she is feeling well lately.  No recent changes in her health.  Currently in her bilateral tissue expander she has: Right: 550/535 cc. Left: 400/535 cc.   Past Medical History: Allergies: Allergies  Allergen Reactions   Other Nausea Only and Other (See Comments)    REACTION: nausea    Sulfa Antibiotics Nausea Only    REACTION: nausea    Current Medications:  Current Outpatient Medications:    aspirin 81 MG chewable tablet, Chew 81 mg by mouth daily., Disp: , Rfl:    cephALEXin (KEFLEX) 500 MG capsule, Take 1 capsule (500 mg total) by mouth 4 (four) times daily for 5 days., Disp: 20 capsule, Rfl: 0   cetirizine (ZYRTEC) 10 MG  tablet, Take 10 mg by mouth daily., Disp: , Rfl:    Cholecalciferol (PA VITAMIN D-3 GUMMY PO), Chew one (1) gummy by mouth daily., Disp: , Rfl:    diazepam (VALIUM) 2 MG tablet, TAKE 1 TABLET(2 MG) BY MOUTH EVERY 12 HOURS AS NEEDED FOR MUSCLE SPASMS, Disp: 20 tablet, Rfl: 0   diazepam (VALIUM) 2 MG tablet, Take 1 tablet (2 mg total) by mouth every 12 (twelve) hours as needed for muscle spasms., Disp: 10 tablet, Rfl: 0   empagliflozin (JARDIANCE) 25 MG TABS tablet, Take 25 mg by mouth daily., Disp: , Rfl:    glycopyrrolate (ROBINUL) 2 MG tablet, Take 1 tablet (2 mg total) by mouth 2 (two) times daily., Disp: 180 tablet, Rfl: 3   HYDROcodone-acetaminophen (NORCO) 5-325 MG tablet, Take 1 tablet by mouth every 6 (six) hours as needed for up to 5 days for severe pain., Disp: 20 tablet, Rfl: 0   Insulin Glargine (BASAGLAR KWIKPEN) 100 UNIT/ML, Inject 34 Units into the skin daily., Disp: , Rfl:    insulin lispro (HUMALOG) 100 UNIT/ML KwikPen, 4 units at lunch and 4 units at evening meal (titrate as directed, max daily dose of 50 units), Disp: , Rfl:    liraglutide (VICTOZA) 18 MG/3ML SOPN, Inject 0.8 mg into the skin every morning., Disp: , Rfl:    lisdexamfetamine (VYVANSE) 20 MG capsule, Take 20 mg by mouth daily., Disp: ,  Rfl:    lisinopril (ZESTRIL) 10 MG tablet, Take 1 tablet (10 mg total) by mouth daily., Disp: 90 tablet, Rfl: 3   Loperamide HCl (IMODIUM PO), Take 1-2 tablets by mouth daily., Disp: , Rfl:    Multiple Vitamins-Minerals (ALIVE WOMENS GUMMY PO), Chew one (1) gummy by mouth daily., Disp: , Rfl:    nitroGLYCERIN (NITROSTAT) 0.4 MG SL tablet, Place 1 tablet (0.4 mg total) under the tongue every 5 (five) minutes as needed for chest pain., Disp: 30 tablet, Rfl: 12   ondansetron (ZOFRAN) 4 MG tablet, Take 1 tablet (4 mg total) by mouth every 8 (eight) hours as needed for nausea or vomiting., Disp: 20 tablet, Rfl: 0   ondansetron (ZOFRAN) 4 MG tablet, Take 1 tablet (4 mg total) by mouth every 8  (eight) hours as needed for nausea or vomiting., Disp: 20 tablet, Rfl: 0   rosuvastatin (CRESTOR) 20 MG tablet, Take 20 mg by mouth daily., Disp: , Rfl:   Current Facility-Administered Medications:    0.9 %  sodium chloride infusion, 500 mL, Intravenous, Once, Ladene Artist, MD  Past Medical Problems: Past Medical History:  Diagnosis Date   ADD (attention deficit disorder)    Allergy    Depression    Ejection fraction    Gestational diabetes 1999; 2002   History of chicken pox    Human papilloma virus    High Risk    Hyperlipidemia    Hypertension    Numbness and tingling    Osteopenia    Pneumonia 2012   Sleep apnea with use of continuous positive airway pressure (CPAP)    Type II diabetes mellitus ([place])    "borderline; I'm on Metformin"    Past Surgical History: Past Surgical History:  Procedure Laterality Date   ANAL RECTAL MANOMETRY N/A 03/17/2015   Procedure: ANO RECTAL MANOMETRY;  Surgeon: Mauri Pole, MD;  Location: [facility];  Service: Endoscopy;  Laterality: N/A;   BREAST LUMPECTOMY WITH RADIOACTIVE SEED LOCALIZATION Right 06/05/2019   Procedure: [name];  Surgeon: Coralie Keens, MD;  Location: [facility];  Service: General;  Laterality: Right;   BREAST RECONSTRUCTION WITH PLACEMENT OF TISSUE EXPANDER AND FLEX HD (ACELLULAR HYDRATED DERMIS) Bilateral 04/19/2020   Procedure: IMMEDIATE BILATERAL BREAST RECONSTRUCTION WITH PLACEMENT OF TISSUE EXPANDER AND FLEX HD (ACELLULAR HYDRATED DERMIS);  Surgeon: Wallace Going, DO;  Location: [facility];  Service: Plastics;  Laterality: Bilateral;   LEFT HEART CATHETERIZATION WITH CORONARY ANGIOGRAM N/A 12/22/2013   Procedure: LEFT HEART CATHETERIZATION WITH CORONARY ANGIOGRAM;  Surgeon: Jettie Booze, MD;  Location: [facility];  Service: Cardiovascular;  Laterality: N/A;   NIPPLE SPARING MASTECTOMY Bilateral 04/19/2020    Procedure: BILATERAL NIPPLE SPARING MASTECTOMY;  Surgeon: Coralie Keens, MD;  Location: [facility];  Service: General;  Laterality: Bilateral;   SHOULDER ARTHROSCOPY W/ ROTATOR CUFF REPAIR Right 1990's   VAGINAL HYSTERECTOMY  2013   HPV/Cervical Dysplasia; partial    Social History: Social History   Socioeconomic History   Marital status: Married    Spouse name: [name] (Separated)    Number of children: 2   Years of education: 14   Highest education level: Not on file  Occupational History   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   Occupation: Full- Time Student    Education: [place] ([place]    Tobacco Use: She has never smoked.     Alcohol Use:  Rarely   Drug Use:  None   Diet:  Regular   Exercise:  None   Hobbies: Shopping      Social Determinants of Health   Financial Resource Strain: Not on file  Food Insecurity: Not on file  Transportation Needs: Not on file  Physical Activity: Not on file  Stress: Not on file  Social Connections: Not on file  Intimate Partner Violence: Not on file    Family History: Family History  Problem Relation Age of Onset   Hyperlipidemia Mother    Hyperlipidemia Father    Hypertension Father    Diabetes Father    Heart disease Father    Stroke Father    Cancer Maternal Grandmother        Colon Cancer   Alzheimer's disease Maternal Grandmother    Colon cancer  Maternal Grandmother    Heart disease Brother 69       Myocardial Infarction   Breast cancer Paternal Aunt    Esophageal cancer Neg Hx    Stomach cancer Neg Hx    Rectal cancer Neg Hx     Review of Systems: Review of Systems  Constitutional: Negative.   Respiratory: Negative.    Cardiovascular: Negative.   Gastrointestinal: Negative.   Musculoskeletal: Negative.   Neurological: Negative.    Physical Exam: Vital Signs BP (!) 156/93 (BP Location: Right Arm, Patient Position: Sitting, Cuff Size: Normal)   Pulse 94   Ht 5\' 2"  (1.575 m)   Wt 157 lb 9.6 oz (71.5 kg)   SpO2 95%   BMI 28.83 kg/m   Physical Exam  Constitutional:      General: Not in acute distress.    Appearance: Normal appearance. Not ill-appearing.  HENT:     Head: Normocephalic and atraumatic.  Eyes:     Pupils: Pupils are equal, round Neck:     Musculoskeletal: Normal range of motion.  Cardiovascular:     Rate and Rhythm: Normal rate    Pulses: Normal pulses.  Pulmonary:     Effort: Pulmonary effort is normal. No respiratory distress.  Abdominal:     General: Abdomen is flat. There is no distension.  Musculoskeletal: Normal range of motion.  Skin:    General: Skin is warm and dry.     Findings: No erythema or rash.  Neurological:     General: No focal deficit present.     Mental Status: Alert and oriented to person, place, and time. Mental status is at baseline.     Motor: No weakness.  Psychiatric:        Mood and Affect: Mood normal.        Behavior: Behavior normal.    Assessment/Plan: The patient is scheduled for exchange of bilateral tissue expanders for bilateral breast implants with Dr. Marla Roe.  Risks, benefits, and alternatives of  procedure discussed, questions answered and consent obtained.    Smoking Status: Non-smoker; Counseling Given?  N/A  Caprini Score: 5, high; Risk Factors include: Age, BMI than 25, and length of planned surgery. Recommendation for mechanical and  pharmacological prophylaxis. Encourage early ambulation.   Pictures obtained: Today at consult  Post-op Rx sent to pharmacy: Valium, Norco, Zofran, Keflex  Patient was provided with the General Surgical Risk consent document and Pain Medication Agreement prior to their appointment.  They had adequate time to read through the risk consent documents and Pain Medication Agreement. We also discussed them in person together during this preop appointment. All of their questions were answered to their satisfaction.  Recommended calling if they have any further questions.  Risk consent form and Pain Medication Agreement to be scanned into patient's chart.  The risks that can be encountered with and after placement of a breast implant were discussed and include the following but not limited to these: bleeding, infection, delayed healing, anesthesia risks, skin sensation changes, injury to structures including nerves, blood vessels, and muscles which may be temporary or permanent, allergies to tape, suture materials and glues, blood products, topical preparations or injected agents, skin contour irregularities, skin discoloration and swelling, deep vein thrombosis, cardiac and pulmonary complications, pain, which may persist, fluid accumulation, wrinkling of the skin over the implanmt, changes in nipple or breast sensation, implant leakage or rupture, faulty position of the implant, persistent pain, formation of tight scar tissue around the implant (capsular contracture).  Patient was provided with the Mentor implant patient decision checklist and this was completed during today's preoperative evaluation. Patient had time to read through the information and any questions were answered to their content. Form will be scanned into patient's chart.  Pictures were obtained of the patient and placed in the chart with the patient's or guardian's permission.  Patient reports she is not currently taking aspirin.  She is  aware to hold this 5 to 7 days prior to surgery.   Electronically signed by: Carola Rhine Tahir Blank, PA-C 07/22/2020 12:51 PM

## 2020-07-23 ENCOUNTER — Encounter: Payer: Managed Care, Other (non HMO) | Admitting: Surgical

## 2020-08-04 ENCOUNTER — Other Ambulatory Visit: Payer: Self-pay

## 2020-08-04 ENCOUNTER — Encounter (HOSPITAL_BASED_OUTPATIENT_CLINIC_OR_DEPARTMENT_OTHER): Payer: Self-pay | Admitting: Plastic Surgery

## 2020-08-06 ENCOUNTER — Encounter (HOSPITAL_BASED_OUTPATIENT_CLINIC_OR_DEPARTMENT_OTHER)
Admission: RE | Admit: 2020-08-06 | Discharge: 2020-08-06 | Disposition: A | Discharge status: Home / Self Care | Payer: Managed Care, Other (non HMO) | Source: Ambulatory Visit | Attending: Plastic Surgery | Admitting: Plastic Surgery

## 2020-08-06 DIAGNOSIS — Z01812 Encounter for preprocedural laboratory examination: Secondary | ICD-10-CM | POA: Diagnosis not present

## 2020-08-06 LAB — BASIC METABOLIC PANEL
Anion gap: 11 (ref 5–15)
BUN: 11 mg/dL (ref 6–20)
CO2: 23 mmol/L (ref 22–32)
Calcium: 9.3 mg/dL (ref 8.9–10.3)
Chloride: 105 mmol/L (ref 98–111)
Creatinine, Ser: 0.64 mg/dL (ref 0.44–1.00)
GFR, Estimated: >60 mL/min (ref >60)
Glucose, Bld: 176 mg/dL — ABNORMAL HIGH (ref 70–99)
Potassium: 4.2 mmol/L (ref 3.5–5.1)
Sodium: 139 mmol/L (ref 135–145)

## 2020-08-06 NOTE — Progress Notes (Signed)

## 2020-08-07 ENCOUNTER — Encounter (HOSPITAL_COMMUNITY): Payer: Self-pay

## 2020-08-09 ENCOUNTER — Other Ambulatory Visit (HOSPITAL_COMMUNITY): Payer: Managed Care, Other (non HMO)

## 2020-08-11 NOTE — Anesthesia Preprocedure Evaluation (Addendum)
Anesthesia Evaluation  Patient identified by MRN, date of birth, ID band Patient awake    Reviewed: Allergy & Precautions, NPO status , Patient's Chart, lab work & pertinent test results  History of Anesthesia Complications Negative for: history of anesthetic complications  Airway Mallampati: II  TM Distance: >3 FB Neck ROM: Full    Dental no notable dental hx.    Pulmonary sleep apnea and Continuous Positive Airway Pressure Ventilation ,    Pulmonary exam normal        Cardiovascular hypertension, Pt. on medications Normal cardiovascular exam     Neuro/Psych Depression ADHDCervical myelopathy    GI/Hepatic negative GI ROS, Neg liver ROS,   Endo/Other  diabetes, Type 2, Oral Hypoglycemic Agents, Insulin Dependent  Renal/GU negative Renal ROS  negative genitourinary   Musculoskeletal   Abdominal   Peds  Hematology negative hematology ROS (+)   Anesthesia Other Findings Day of surgery medications reviewed with patient.  Reproductive/Obstetrics negative OB ROS                            Anesthesia Physical Anesthesia Plan  ASA: 2  Anesthesia Plan: General   Post-op Pain Management:    Induction: Intravenous  PONV Risk Score and Plan: 3 and Treatment may vary due to age or medical condition, Ondansetron, Dexamethasone and Midazolam  Airway Management Planned: LMA  Additional Equipment: None  Intra-op Plan:   Post-operative Plan: Extubation in OR  Informed Consent: I have reviewed the patients History and Physical, chart, labs and discussed the procedure including the risks, benefits and alternatives for the proposed anesthesia with the patient or authorized representative who has indicated his/her understanding and acceptance.     Dental advisory given  Plan Discussed with: CRNA  Anesthesia Plan Comments:        Anesthesia Quick Evaluation

## 2020-08-12 ENCOUNTER — Ambulatory Visit (HOSPITAL_BASED_OUTPATIENT_CLINIC_OR_DEPARTMENT_OTHER): Payer: Managed Care, Other (non HMO) | Admitting: Anesthesiology

## 2020-08-12 ENCOUNTER — Encounter (HOSPITAL_BASED_OUTPATIENT_CLINIC_OR_DEPARTMENT_OTHER): Payer: Self-pay | Admitting: Plastic Surgery

## 2020-08-12 ENCOUNTER — Ambulatory Visit (HOSPITAL_BASED_OUTPATIENT_CLINIC_OR_DEPARTMENT_OTHER)
Admission: RE | Admit: 2020-08-12 | Discharge: 2020-08-12 | Disposition: A | Discharge status: Home / Self Care | Payer: Managed Care, Other (non HMO) | Attending: Plastic Surgery | Admitting: Plastic Surgery

## 2020-08-12 ENCOUNTER — Other Ambulatory Visit: Payer: Self-pay

## 2020-08-12 ENCOUNTER — Encounter (HOSPITAL_BASED_OUTPATIENT_CLINIC_OR_DEPARTMENT_OTHER)
Admission: RE | Disposition: A | Discharge status: Home / Self Care | Payer: Self-pay | Source: Home / Self Care | Attending: Plastic Surgery

## 2020-08-12 DIAGNOSIS — Z882 Allergy status to sulfonamides status: Secondary | ICD-10-CM | POA: Insufficient documentation

## 2020-08-12 DIAGNOSIS — Z7984 Long term (current) use of oral hypoglycemic drugs: Secondary | ICD-10-CM | POA: Diagnosis not present

## 2020-08-12 DIAGNOSIS — C50919 Malignant neoplasm of unspecified site of unspecified female breast: Secondary | ICD-10-CM | POA: Insufficient documentation

## 2020-08-12 DIAGNOSIS — E119 Type 2 diabetes mellitus without complications: Secondary | ICD-10-CM | POA: Diagnosis not present

## 2020-08-12 DIAGNOSIS — Z9013 Acquired absence of bilateral breasts and nipples: Secondary | ICD-10-CM | POA: Insufficient documentation

## 2020-08-12 DIAGNOSIS — Z45812 Encounter for adjustment or removal of left breast implant: Secondary | ICD-10-CM | POA: Insufficient documentation

## 2020-08-12 DIAGNOSIS — Z45811 Encounter for adjustment or removal of right breast implant: Secondary | ICD-10-CM | POA: Diagnosis not present

## 2020-08-12 DIAGNOSIS — Z7982 Long term (current) use of aspirin: Secondary | ICD-10-CM | POA: Insufficient documentation

## 2020-08-12 DIAGNOSIS — Z794 Long term (current) use of insulin: Secondary | ICD-10-CM | POA: Insufficient documentation

## 2020-08-12 DIAGNOSIS — Z79810 Long term (current) use of selective estrogen receptor modulators (SERMs): Secondary | ICD-10-CM | POA: Diagnosis not present

## 2020-08-12 HISTORY — PX: REMOVAL OF TISSUE EXPANDER AND PLACEMENT OF IMPLANT: SHX6457

## 2020-08-12 LAB — GLUCOSE, CAPILLARY
Glucose-Capillary: 186 mg/dL — ABNORMAL HIGH (ref 70–99)
Glucose-Capillary: 146 mg/dL — ABNORMAL HIGH (ref 70–99)

## 2020-08-12 SURGERY — REMOVAL, TISSUE EXPANDER, BREAST, WITH IMPLANT INSERTION
Anesthesia: General | Site: Breast | Laterality: Bilateral

## 2020-08-12 MED ORDER — OXYCODONE HCL 5 MG PO TABS: 5.0000 mg | ORAL_TABLET | Freq: Once | ORAL | Status: DC | PRN

## 2020-08-12 MED ORDER — CHLORHEXIDINE GLUCONATE CLOTH 2 % EX PADS: 6.0000 | MEDICATED_PAD | Freq: Once | CUTANEOUS | Status: DC

## 2020-08-12 MED ORDER — SUCCINYLCHOLINE CHLORIDE 200 MG/10ML IV SOSY
PREFILLED_SYRINGE | INTRAVENOUS | Status: AC
  Filled 2020-08-12: qty 10

## 2020-08-12 MED ORDER — LACTATED RINGERS IV SOLN: INTRAVENOUS | Status: DC

## 2020-08-12 MED ORDER — DEXAMETHASONE SODIUM PHOSPHATE 10 MG/ML IJ SOLN
INTRAMUSCULAR | Status: AC
  Filled 2020-08-12: qty 1

## 2020-08-12 MED ORDER — ACETAMINOPHEN 325 MG PO TABS: 650.0000 mg | ORAL_TABLET | ORAL | Status: DC | PRN

## 2020-08-12 MED ORDER — LIDOCAINE HCL (PF) 2 % IJ SOLN
INTRAMUSCULAR | Status: AC
  Filled 2020-08-12: qty 5

## 2020-08-12 MED ORDER — ACETAMINOPHEN 325 MG RE SUPP: 650.0000 mg | RECTAL | Status: DC | PRN

## 2020-08-12 MED ORDER — LIDOCAINE-EPINEPHRINE 1 %-1:100000 IJ SOLN
INTRAMUSCULAR | Status: DC | PRN
  Administered 2020-08-12: 17 mL via INTRAMUSCULAR

## 2020-08-12 MED ORDER — SODIUM CHLORIDE 0.9 % IV SOLN
INTRAVENOUS | Status: AC
  Filled 2020-08-12: qty 10

## 2020-08-12 MED ORDER — ONDANSETRON HCL 4 MG/2ML IJ SOLN
INTRAMUSCULAR | Status: AC
  Filled 2020-08-12: qty 2

## 2020-08-12 MED ORDER — EPHEDRINE 5 MG/ML INJ
INTRAVENOUS | Status: AC
  Filled 2020-08-12: qty 10

## 2020-08-12 MED ORDER — FENTANYL CITRATE (PF) 100 MCG/2ML IJ SOLN
INTRAMUSCULAR | Status: DC | PRN
  Administered 2020-08-12: 100 ug via INTRAVENOUS
  Administered 2020-08-12: 50 ug via INTRAVENOUS

## 2020-08-12 MED ORDER — PROMETHAZINE HCL 25 MG/ML IJ SOLN: 6.2500 mg | INTRAMUSCULAR | Status: DC | PRN

## 2020-08-12 MED ORDER — FENTANYL CITRATE (PF) 100 MCG/2ML IJ SOLN: 25.0000 ug | INTRAMUSCULAR | Status: DC | PRN

## 2020-08-12 MED ORDER — FENTANYL CITRATE (PF) 100 MCG/2ML IJ SOLN
INTRAMUSCULAR | Status: AC
  Filled 2020-08-12: qty 2

## 2020-08-12 MED ORDER — PHENYLEPHRINE 40 MCG/ML (10ML) SYRINGE FOR IV PUSH (FOR BLOOD PRESSURE SUPPORT)
PREFILLED_SYRINGE | INTRAVENOUS | Status: AC
  Filled 2020-08-12: qty 10

## 2020-08-12 MED ORDER — SODIUM CHLORIDE 0.9 % IV SOLN: 250.0000 mL | INTRAVENOUS | Status: DC | PRN

## 2020-08-12 MED ORDER — LIDOCAINE 2% (20 MG/ML) 5 ML SYRINGE
INTRAMUSCULAR | Status: DC | PRN
  Administered 2020-08-12: 100 mg via INTRAVENOUS

## 2020-08-12 MED ORDER — OXYCODONE HCL 5 MG/5ML PO SOLN: 5.0000 mg | Freq: Once | ORAL | Status: DC | PRN

## 2020-08-12 MED ORDER — OXYCODONE HCL 5 MG PO TABS
5.0000 mg | ORAL_TABLET | ORAL | Status: DC | PRN
Start: 2020-08-12 — End: 2020-08-12

## 2020-08-12 MED ORDER — PHENYLEPHRINE 40 MCG/ML (10ML) SYRINGE FOR IV PUSH (FOR BLOOD PRESSURE SUPPORT)
PREFILLED_SYRINGE | INTRAVENOUS | Status: DC | PRN
  Administered 2020-08-12: 120 ug via INTRAVENOUS

## 2020-08-12 MED ORDER — PROPOFOL 10 MG/ML IV BOLUS
INTRAVENOUS | Status: DC | PRN
  Administered 2020-08-12: 150 mg via INTRAVENOUS

## 2020-08-12 MED ORDER — MIDAZOLAM HCL 2 MG/2ML IJ SOLN
INTRAMUSCULAR | Status: AC
  Filled 2020-08-12: qty 2

## 2020-08-12 MED ORDER — SODIUM CHLORIDE 0.9% FLUSH: 3.0000 mL | INTRAVENOUS | Status: DC | PRN

## 2020-08-12 MED ORDER — DEXAMETHASONE SODIUM PHOSPHATE 4 MG/ML IJ SOLN
INTRAMUSCULAR | Status: DC | PRN
  Administered 2020-08-12: 5 mg via INTRAVENOUS

## 2020-08-12 MED ORDER — ONDANSETRON HCL 4 MG/2ML IJ SOLN
INTRAMUSCULAR | Status: DC | PRN
  Administered 2020-08-12: 4 mg via INTRAVENOUS

## 2020-08-12 MED ORDER — CEFAZOLIN SODIUM-DEXTROSE 2-4 GM/100ML-% IV SOLN
INTRAVENOUS | Status: AC
  Filled 2020-08-12: qty 100

## 2020-08-12 MED ORDER — CEFAZOLIN SODIUM-DEXTROSE 2-4 GM/100ML-% IV SOLN
2.0000 g | INTRAVENOUS | Status: AC
  Administered 2020-08-12: 2 g via INTRAVENOUS

## 2020-08-12 MED ORDER — MIDAZOLAM HCL 5 MG/5ML IJ SOLN
INTRAMUSCULAR | Status: DC | PRN
  Administered 2020-08-12: 2 mg via INTRAVENOUS

## 2020-08-12 MED ORDER — SODIUM CHLORIDE 0.9 % IV SOLN: INTRAVENOUS | Status: DC | PRN

## 2020-08-12 MED ORDER — ACETAMINOPHEN 500 MG PO TABS
1000.0000 mg | ORAL_TABLET | Freq: Once | ORAL | Status: AC
  Administered 2020-08-12: 1000 mg via ORAL

## 2020-08-12 MED ORDER — ACETAMINOPHEN 500 MG PO TABS
ORAL_TABLET | ORAL | Status: AC
  Filled 2020-08-12: qty 2

## 2020-08-12 MED ORDER — SODIUM CHLORIDE 0.9% FLUSH: 3.0000 mL | Freq: Two times a day (BID) | INTRAVENOUS | Status: DC

## 2020-08-12 SURGICAL SUPPLY — 76 items
ADH SKN CLS APL DERMABOND .7 (GAUZE/BANDAGES/DRESSINGS) ×2
BAG DECANTER FOR FLEXI CONT (MISCELLANEOUS) ×2 IMPLANT
BINDER BREAST LRG (GAUZE/BANDAGES/DRESSINGS) IMPLANT
BINDER BREAST MEDIUM (GAUZE/BANDAGES/DRESSINGS) IMPLANT
BINDER BREAST XLRG (GAUZE/BANDAGES/DRESSINGS) ×2 IMPLANT
BINDER BREAST XXLRG (GAUZE/BANDAGES/DRESSINGS) IMPLANT
BIOPATCH RED 1 DISK 7.0 (GAUZE/BANDAGES/DRESSINGS) IMPLANT
BLADE SURG 10 STRL SS (BLADE) ×4 IMPLANT
BLADE SURG 15 STRL LF DISP TIS (BLADE) ×2 IMPLANT
BLADE SURG 15 STRL SS (BLADE) ×4
BNDG GAUZE ELAST 4 BULKY (GAUZE/BANDAGES/DRESSINGS) IMPLANT
CANISTER SUCT 1200ML W/VALVE (MISCELLANEOUS) ×4 IMPLANT
COVER BACK TABLE 60X90IN (DRAPES) ×2 IMPLANT
COVER MAYO STAND STRL (DRAPES) ×2 IMPLANT
DECANTER SPIKE VIAL GLASS SM (MISCELLANEOUS) IMPLANT
DERMABOND ADVANCED (GAUZE/BANDAGES/DRESSINGS) ×2
DERMABOND ADVANCED .7 DNX12 (GAUZE/BANDAGES/DRESSINGS) ×2 IMPLANT
DRAIN CHANNEL 19F RND (DRAIN) IMPLANT
DRAPE LAPAROSCOPIC ABDOMINAL (DRAPES) ×2 IMPLANT
DRSG PAD ABDOMINAL 8X10 ST (GAUZE/BANDAGES/DRESSINGS) ×4 IMPLANT
ELECT BLADE 4.0 EZ CLEAN MEGAD (MISCELLANEOUS) ×2
ELECT COATED BLADE 2.86 ST (ELECTRODE) IMPLANT
ELECT REM PT RETURN 9FT ADLT (ELECTROSURGICAL) ×2
ELECTRODE BLDE 4.0 EZ CLN MEGD (MISCELLANEOUS) ×1 IMPLANT
ELECTRODE REM PT RTRN 9FT ADLT (ELECTROSURGICAL) ×1 IMPLANT
EVACUATOR SILICONE 100CC (DRAIN) IMPLANT
FUNNEL KELLER 2 DISP (MISCELLANEOUS) ×2 IMPLANT
GAUZE SPONGE 4X4 12PLY STRL LF (GAUZE/BANDAGES/DRESSINGS) IMPLANT
GLOVE SURG ENC MOIS LTX SZ6 (GLOVE) ×2 IMPLANT
GLOVE SURG ENC MOIS LTX SZ6.5 (GLOVE) ×4 IMPLANT
GLOVE SURG POLYISO LF SZ7 (GLOVE) ×2 IMPLANT
GLOVE SURG UNDER POLY LF SZ6.5 (GLOVE) ×2 IMPLANT
GLOVE SURG UNDER POLY LF SZ7 (GLOVE) ×2 IMPLANT
GOWN STRL REUS W/ TWL LRG LVL3 (GOWN DISPOSABLE) ×5 IMPLANT
GOWN STRL REUS W/TWL LRG LVL3 (GOWN DISPOSABLE) ×10
IMPL BREAST P6.5XULT HI 650 (Breast) ×1 IMPLANT
IMPL BRST P6.5XULT HI 650CC (Breast) ×1 IMPLANT
IMPL GEL 450CC (Breast) ×1 IMPLANT
IMPLANT BREAST GEL 650CC (Breast) ×2 IMPLANT
IMPLANT GEL 450CC (Breast) ×2 IMPLANT
IV NS 1000ML (IV SOLUTION)
IV NS 1000ML BAXH (IV SOLUTION) IMPLANT
NDL SAFETY ECLIPSE 18X1.5 (NEEDLE) ×1 IMPLANT
NEEDLE HYPO 18GX1.5 SHARP (NEEDLE) ×2
NEEDLE HYPO 25X1 1.5 SAFETY (NEEDLE) ×2 IMPLANT
PACK BASIN DAY SURGERY FS (CUSTOM PROCEDURE TRAY) ×2 IMPLANT
PENCIL SMOKE EVACUATOR (MISCELLANEOUS) ×2 IMPLANT
PIN SAFETY STERILE (MISCELLANEOUS) IMPLANT
SIZER BREAST 450CC (SIZER) ×2
SIZER BREAST REUSE 590CC (SIZER) ×2
SIZER BREAST REUSE 650CC (SIZER) ×2
SIZER BRST P5.1XHI 450CC (SIZER) ×1 IMPLANT
SIZER BRST REUSE 590CC (SIZER) ×1 IMPLANT
SIZER BRST REUSE P6.4 650CC (SIZER) ×1 IMPLANT
SLEEVE SCD COMPRESS KNEE MED (STOCKING) ×2 IMPLANT
SPONGE T-LAP 18X18 ~~LOC~~+RFID (SPONGE) ×4 IMPLANT
STRIP SUTURE WOUND CLOSURE 1/2 (MISCELLANEOUS) ×4 IMPLANT
SUT MNCRL AB 3-0 PS2 18 (SUTURE) IMPLANT
SUT MNCRL AB 4-0 PS2 18 (SUTURE) ×4 IMPLANT
SUT MON AB 3-0 SH 27 (SUTURE) ×6
SUT MON AB 3-0 SH27 (SUTURE) ×3 IMPLANT
SUT MON AB 5-0 PS2 18 (SUTURE) IMPLANT
SUT PDS 3-0 CT2 (SUTURE) ×8
SUT PDS AB 2-0 CT2 27 (SUTURE) IMPLANT
SUT PDS II 3-0 CT2 27 ABS (SUTURE) ×4 IMPLANT
SUT SILK 3 0 PS 1 (SUTURE) IMPLANT
SUT VIC AB 3-0 SH 27 (SUTURE)
SUT VIC AB 3-0 SH 27X BRD (SUTURE) IMPLANT
SUT VICRYL 4-0 PS2 18IN ABS (SUTURE) IMPLANT
SYR BULB IRRIG 60ML STRL (SYRINGE) ×2 IMPLANT
SYR CONTROL 10ML LL (SYRINGE) ×2 IMPLANT
TOWEL GREEN STERILE FF (TOWEL DISPOSABLE) ×4 IMPLANT
TRAY DSU PREP LF (CUSTOM PROCEDURE TRAY) ×2 IMPLANT
TUBE CONNECTING 20X1/4 (TUBING) ×2 IMPLANT
UNDERPAD 30X36 HEAVY ABSORB (UNDERPADS AND DIAPERS) ×4 IMPLANT
YANKAUER SUCT BULB TIP NO VENT (SUCTIONS) ×4 IMPLANT

## 2020-08-12 NOTE — Transfer of Care (Signed)
Immediate Anesthesia Transfer of Care Note  Patient: Lindsay Rios  Procedure(s) Performed: REMOVAL OF TISSUE EXPANDER AND PLACEMENT OF IMPLANT (Bilateral: Breast)  Patient Location: PACU  Anesthesia Type:General  Level of Consciousness: awake, drowsy and patient cooperative  Airway & Oxygen Therapy: Patient Spontanous Breathing and Patient connected to face mask oxygen  Post-op Assessment: Report given to RN and Post -op Vital signs reviewed and stable  Post vital signs: Reviewed and stable  Last Vitals:  Vitals Value Taken Time  BP    Temp    Pulse    Resp    SpO2      Last Pain:  Vitals:   08/12/20 0848  TempSrc: Oral  PainSc: 0-No pain      Patients Stated Pain Goal: 5 (75/91/63 [id number])  Complications: No notable events documented.

## 2020-08-12 NOTE — Anesthesia Procedure Notes (Signed)
Procedure Name: LMA Insertion Date/Time: 08/12/2020 9:46 AM Performed by: Willa Frater, CRNA Pre-anesthesia Checklist: Patient identified, Emergency Drugs available, Suction available and Patient being monitored Patient Re-evaluated:Patient Re-evaluated prior to induction Oxygen Delivery Method: Circle system utilized Preoxygenation: Pre-oxygenation with 100% oxygen Induction Type: IV induction Ventilation: Mask ventilation without difficulty LMA: LMA inserted LMA Size: 4.0 Number of attempts: 1 Airway Equipment and Method: Bite block Placement Confirmation: positive ETCO2 Tube secured with: Tape Dental Injury: Teeth and Oropharynx as per pre-operative assessment

## 2020-08-12 NOTE — Anesthesia Postprocedure Evaluation (Signed)
Anesthesia Post Note  Patient: Lindsay Rios  Procedure(s) Performed: REMOVAL OF TISSUE EXPANDER AND PLACEMENT OF IMPLANT (Bilateral: Breast)     Patient location during evaluation: PACU Anesthesia Type: General Level of consciousness: awake and alert and oriented Pain management: pain level controlled Vital Signs Assessment: post-procedure vital signs reviewed and stable Respiratory status: spontaneous breathing, nonlabored ventilation and respiratory function stable Cardiovascular status: blood pressure returned to baseline Postop Assessment: no apparent nausea or vomiting Anesthetic complications: no   No notable events documented.  Last Vitals:  Vitals:   08/12/20 1200 08/12/20 1215  BP: 134/81 138/82  Pulse: 77 85  Resp: 20 16  Temp:  36.9 C  SpO2: 92% 94%    Last Pain:  Vitals:   08/12/20 1215  TempSrc:   PainSc: 0-No pain                 Brennan Bailey

## 2020-08-12 NOTE — Op Note (Signed)
Op report Bilateral Exchange   DATE OF OPERATION: 08/12/2020  LOCATION: [place]  SURGICAL DIVISION: Plastic Surgery  PREOPERATIVE DIAGNOSIS:  History of breast cancer.  2.   Acquired absence of bilateral breast.   POSTOPERATIVE DIAGNOSIS:  1. History of breast cancer.  2. Acquired absence of bilateral breast.   PROCEDURE:  1. Bilateral exchange of tissue expanders for implants.  2. Bilateral capsulotomies for implant respositioning.  SURGEON: Aila Terra Sanger Nirvaan Frett, DO  ASSISTANT: Roetta Sessions, PA  ANESTHESIA:  General.   COMPLICATIONS: None.   IMPLANTS: Left - Mentor Smooth Round High Profile Gel 450cc. Ref #[id number].  Serial Number [id number] Right - Mentor Smooth Round Ultra High Profile Gel 650cc. Ref #[id number].  Serial Number [id number]  INDICATIONS FOR PROCEDURE:  The patient, Lindsay Rios, is a 55 y.o. female born on [birth year], is here for treatment after bilateral mastectomies.  She had tissue expanders placed at the time of mastectomies. She now presents for exchange of her expanders for implants.  She requires capsulotomies to better position the implants. MRN: [id number]  CONSENT:  Informed consent was obtained directly from the patient. Risks, benefits and alternatives were fully discussed. Specific risks including but not limited to bleeding, infection, hematoma, seroma, scarring, pain, implant infection, implant extrusion, capsular contracture, asymmetry, wound healing problems, and need for further surgery were all discussed. The patient did have an ample opportunity to have her questions answered to her satisfaction.   DESCRIPTION OF PROCEDURE:  The patient was taken to the operating room. SCDs were placed and IV antibiotics were given. The patient's chest was prepped and draped in a sterile fashion. A time out was performed and the implants to be used were identified.    Right breast: One percent Lidocaine with  epinephrine was used to infiltrate at the incision site. The old mastectomy scar was incised.  The mastectomy flaps from the superior and inferior flaps were raised over the capsule for several centimeters to minimize tension for the closure. The capsule inferior to the skin incision to expose and remove the tissue expander.  Inspection of the pocket showed a normal healthy capsule and good integration of the biologic matrix.  The pocket was irrigated with antibiotic solution.  Circumferential capsulotomies were performed to allow for breast pocket expansion.  Measurements were made and a sizer used to confirm adequate pocket size for the implant dimensions.  Hemostasis was ensured with electrocautery. New gloves were placed. The implant was soaked in antibiotic solution and then placed in the pocket and oriented appropriately. The capsule on the anterior surface was closed with a 3-0 PDS suture. The remaining skin was closed with 3-0 Monocryl deep dermal and 4-0 Monocryl subcuticular stitches.   Left breast: The old mastectomy scar was incised.  The mastectomy flaps from the superior and inferior flaps were raised over the capsule for several centimeters to minimize tension for the closure. The capsule was split inferior to the skin incision to expose and remove the tissue expander.  Inspection of the pocket showed a normal healthy capsule and good integration of the biologic matrix.   Circumferential capsulotomies were performed to allow for breast pocket expansion.  Measurements were made and a sizer utilized to confirm adequate pocket size for the implant dimensions.  Hemostasis was ensured with the electrocautery.  New gloves were applied. The implant was soaked in antibiotic solution and placed in the pocket and oriented appropriately. The capsule on the anterior surface was closed with a 3-0 PDS suture.  The remaining skin was closed with 3-0 Monocryl deep dermal and 4-0 Monocryl subcuticular stitches.   Several sizers were used to be sure we obtained symmetry. Dermabond was applied to the incision site. A breast binder and ABDs were placed.  The patient was allowed to wake from anesthesia and taken to the recovery room in satisfactory condition.   The advanced practice practitioner (APP) assisted throughout the case.  The APP was essential in retraction and counter traction when needed to make the case progress smoothly.  This retraction and assistance made it possible to see the tissue plans for the procedure.  The assistance was needed for blood control, tissue re-approximation and assisted with closure of the incision site.

## 2020-08-12 NOTE — Interval H&P Note (Signed)
History and Physical Interval Note:  08/12/2020 9:05 AM  Lindsay Rios  has presented today for surgery, with the diagnosis of Acquired absence of both breasts.  The various methods of treatment have been discussed with the patient and family. After consideration of risks, benefits and other options for treatment, the patient has consented to  Procedure(s) with comments: REMOVAL OF TISSUE EXPANDER AND PLACEMENT OF IMPLANT (Bilateral) - 90 min as a surgical intervention.  The patient's history has been reviewed, patient examined, no change in status, stable for surgery.  I have reviewed the patient's chart and labs.  Questions were answered to the patient's satisfaction.     Lindsay Rios Lindsay Rios

## 2020-08-12 NOTE — Discharge Instructions (Addendum)
INSTRUCTIONS FOR AFTER SURGERY   You will likely have some questions about what to expect following your operation.  The following information will help you and your family understand what to expect when you are discharged from the hospital.  Following these guidelines will help ensure a smooth recovery and reduce risks of complications.  Postoperative instructions include information on: diet, wound care, medications and physical activity.  AFTER SURGERY Expect to go home after the procedure.  In some cases, you may need to spend one night in the hospital for observation.  DIET This surgery does not require a specific diet.  However, I have to mention that the healthier you eat the better your body can start healing. It is important to increasing your protein intake.  This means limiting the foods with added sugar.  Focus on fruits and vegetables and some meat. It is very important to drink water after your surgery.  If your urine is bright yellow, then it is concentrated, and you need to drink more water.  As a general rule after surgery, you should have 8 ounces of water every hour while awake.  If you find you are persistently nauseated or unable to take in liquids let us know.  NO TOBACCO USE or EXPOSURE.  This will slow your healing process and increase the risk of a wound.  WOUND CARE If you don't have a drain: You can shower the day after surgery.  Use fragrance free soap.  Dial, [place], [place] and Cetaphil are usually mild on the skin.  If you have steri-strips / tape directly attached to your skin leave them in place. It is OK to get these wet.  No baths, pools or hot tubs for two weeks. We close your incision to leave the smallest and best-looking scar. No ointment or creams on your incisions until given the go ahead.  Especially not Neosporin (Too many skin reactions with this one).  A few weeks after surgery you can use Mederma and start massaging the scar. We ask you to wear your binder or  sports bra for the first 6 weeks around the clock, including while sleeping. This provides added comfort and helps reduce the fluid accumulation at the surgery site.  ACTIVITY No heavy lifting until cleared by the doctor.  It is OK to walk and climb stairs. In fact, moving your legs is very important to decrease your risk of a blood clot.  It will also help keep you from getting deconditioned.  Every 1 to 2 hours get up and walk for 5 minutes. This will help with a quicker recovery back to normal.  Let pain be your guide so you don't do too much.  NO, you cannot do the spring cleaning and don't plan on taking care of anyone else.  This is your time for TLC.   WORK Everyone returns to work at different times. As a rough guide, most people take at least 1 - 2 weeks off prior to returning to work. If you need documentation for your job, bring the forms to your postoperative follow up visit.  DRIVING Arrange for someone to bring you home from the hospital.  You may be able to drive a few days after surgery but not while taking any narcotics or valium.  BOWEL MOVEMENTS Constipation can occur after anesthesia and while taking pain medication.  It is important to stay ahead for your comfort.  We recommend taking Milk of Magnesia (2 tablespoons; twice a day) while taking  the pain pills.  SEROMA This is fluid your body tried to put in the surgical site.  This is normal but if it creates excessive pain and swelling let us know.  It usually decreases in a few weeks.  MEDICATIONS and PAIN CONTROL At your preoperative visit for you history and physical you were given the following medications: An antibiotic: Start this medication when you get home and take according to the instructions on the bottle. Zofran 4 mg:  This is to treat nausea and vomiting.  You can take this every 6 hours as needed and only if needed. Norco (hydrocodone/acetaminophen) 5/325 mg:  This is only to be used after you have taken the  motrin or the tylenol. Every 8 hours as needed. Over the counter Medication to take: Ibuprofen (Motrin) 600 mg:  Take this every 6 hours.  If you have additional pain then take 500 mg of the tylenol.  Only take the Norco after you have tried these two. Miralax or stool softener of choice: Take this according to the bottle if you take the Norco.  *You took 1000 mg of tylenol at 9:00am  WHEN TO CALL Call your surgeon's office if any of the following occur:  Fever 101 degrees F or greater  Excessive bleeding or fluid from the incision site.  Pain that increases over time without aid from the medications  Redness, warmth, or pus draining from incision sites  Persistent nausea or inability to take in liquids  Severe misshapen area that underwent the operation.   Post Anesthesia Home Care Instructions  Activity: Get plenty of rest for the remainder of the day. A responsible individual must stay with you for 24 hours following the procedure.  For the next 24 hours, DO NOT: -Drive a car -Paediatric nurse -Drink alcoholic beverages -Take any medication unless instructed by your physician -Make any legal decisions or sign important papers.  Meals: Start with liquid foods such as gelatin or soup. Progress to regular foods as tolerated. Avoid greasy, spicy, heavy foods. If nausea and/or vomiting occur, drink only clear liquids until the nausea and/or vomiting subsides. Call your physician if vomiting continues.  Special Instructions/Symptoms: Your throat may feel dry or sore from the anesthesia or the breathing tube placed in your throat during surgery. If this causes discomfort, gargle with warm salt water. The discomfort should disappear within 24 hours.  If you had a scopolamine patch placed behind your ear for the management of post- operative nausea and/or vomiting:  1. The medication in the patch is effective for 72 hours, after which it should be removed.  Wrap patch in a tissue and  discard in the trash. Wash hands thoroughly with soap and water. 2. You may remove the patch earlier than 72 hours if you experience unpleasant side effects which may include dry mouth, dizziness or visual disturbances. 3. Avoid touching the patch. Wash your hands with soap and water after contact with the patch.

## 2020-08-20 ENCOUNTER — Ambulatory Visit (INDEPENDENT_AMBULATORY_CARE_PROVIDER_SITE_OTHER): Payer: Managed Care, Other (non HMO) | Admitting: Plastic Surgery

## 2020-08-20 ENCOUNTER — Encounter: Payer: Self-pay | Admitting: Plastic Surgery

## 2020-08-20 ENCOUNTER — Other Ambulatory Visit: Payer: Self-pay

## 2020-08-20 DIAGNOSIS — N6091 Unspecified benign mammary dysplasia of right breast: Secondary | ICD-10-CM

## 2020-08-20 DIAGNOSIS — Z9013 Acquired absence of bilateral breasts and nipples: Secondary | ICD-10-CM

## 2020-08-20 NOTE — Progress Notes (Signed)
The patient is a 55 year old female here for follow-up on her surgery from 7/14.  She had removal of bilateral expanders and placement of implants.  She currently has a 450 cc Mentor smooth round high-profile gel on the left and a 650 cc Mentor smooth round ultrahigh profile gel implant on the right.  She has a little bit of pain but overall she has done really well.  No sign of seroma or hematoma.  Dressings are in place and we will plan to remove those at the next visit.  She can go into a sports bra.

## 2020-09-03 ENCOUNTER — Other Ambulatory Visit: Payer: Self-pay

## 2020-09-03 ENCOUNTER — Telehealth: Payer: Self-pay | Admitting: *Deleted

## 2020-09-03 ENCOUNTER — Encounter: Payer: Self-pay | Admitting: Surgical

## 2020-09-03 ENCOUNTER — Ambulatory Visit (INDEPENDENT_AMBULATORY_CARE_PROVIDER_SITE_OTHER): Payer: Managed Care, Other (non HMO) | Admitting: Surgical

## 2020-09-03 DIAGNOSIS — N6091 Unspecified benign mammary dysplasia of right breast: Secondary | ICD-10-CM

## 2020-09-03 DIAGNOSIS — C50919 Malignant neoplasm of unspecified site of unspecified female breast: Secondary | ICD-10-CM

## 2020-09-03 DIAGNOSIS — Z9013 Acquired absence of bilateral breasts and nipples: Secondary | ICD-10-CM

## 2020-09-03 NOTE — Progress Notes (Signed)
55 year old female here for follow-up on her bilateral breast reconstruction.  She had a bilateral tissue expanders removed and bilateral silicone implants placed on 08/12/2020.  She has a 450 cc Mentor smooth round high-profile gel on the left She has a 650 cc Mentor smooth round ultrahigh profile gel on the right.  She is overall doing well, no infectious symptoms.  She is having some tightness/cramping of her left hand, reports this occurred after her last procedure as well.    Chaperone present on exam On exam bilateral breast incisions are intact, bilateral NAC's are viable.  No wounds are noted.  She overall has a good contour with no significant areas of volume loss noted.  Recommend continue her compressive garment for 3 more weeks, avoid strenuous activity for 3 more weeks. Recommend following up in a few months for reevaluation and to reevaluate for need for fat grafting. There is no sign of infection, seroma, hematoma.  All of her questions were answered to her content.  Picture was taken and placed in the patient's chart with patient's permission.  Recommend calling with any changes.  Discussed with patient we can certainly see her sooner if she has any changes or concerns.

## 2020-09-03 NOTE — Telephone Encounter (Signed)
Faxed order to [facility] for mastectomy supplies for the patient.  Confirmation received and copy scanned into the chart.//AB/CMA

## 2020-09-20 ENCOUNTER — Telehealth: Payer: Self-pay

## 2020-09-20 NOTE — Telephone Encounter (Signed)
Fyi to PA

## 2020-09-20 NOTE — Telephone Encounter (Signed)
Patient called to say that she had reconstructive surgery on 08/12/2020 and she is having pain on both sides, in the front at the incision site, and at her nipples.  Patient said that she would like to know if this is normal.  She said that she has been pushing her husband in a wheelchair, so she isn't sure if this could be causing it.  She said she thinks she may need to see a doctor.  Please call.

## 2020-09-20 NOTE — Telephone Encounter (Signed)
Noted  

## 2020-09-20 NOTE — Telephone Encounter (Signed)
Pt reported she had a f/u with Matt on 8/5 and was adv to wait a few weeks before pushing her father's wheelchair. Pt has been pushing him full time and believes that she may have started back too early. She reports pain at the excision sites and around nipples. She requested that her appt be moved up from 11/9. Transferred pt to [place] and gave message to [facility] and she adv she would call pt.   Larina Bras to inform pt to alternate between Tylenol and ibuprofen for pain until we can get her into the office sooner as I didn't get to mention it before getting off the phone with pt.

## 2020-09-24 ENCOUNTER — Ambulatory Visit (INDEPENDENT_AMBULATORY_CARE_PROVIDER_SITE_OTHER): Payer: Managed Care, Other (non HMO) | Admitting: Plastic Surgery

## 2020-09-24 ENCOUNTER — Other Ambulatory Visit: Payer: Self-pay

## 2020-09-24 ENCOUNTER — Encounter: Payer: Self-pay | Admitting: Plastic Surgery

## 2020-09-24 DIAGNOSIS — N6091 Unspecified benign mammary dysplasia of right breast: Secondary | ICD-10-CM

## 2020-09-24 DIAGNOSIS — Z9013 Acquired absence of bilateral breasts and nipples: Secondary | ICD-10-CM

## 2020-09-24 NOTE — Progress Notes (Signed)
   Subjective:    Patient ID: Lindsay Rios, female    DOB: Jun 30, 1965, 55 y.o.   MRN: [id number]  The patient is a 55 year old female here for follow-up on her breast reconstruction.  She was concerned about some tenderness on the lateral part of her breasts.  She had exchange to implants in July.  She looks amazing and is healing really well.  The incisions are nicely closed.  There is no sign of infection or seroma.  She feels very soft.  When I push on the lateral aspect of her ribs and muscles she has a little bit of tenderness.  She has been doing a lot more with her disabled dad and this may have contributed to some increased tenderness.     Review of Systems  Constitutional: Negative.   HENT: Negative.    Eyes: Negative.   Respiratory: Negative.    Cardiovascular: Negative.   Gastrointestinal: Negative.   Endocrine: Negative.   Genitourinary: Negative.   Neurological: Negative.   Hematological: Negative.       Objective:   Physical Exam Nursing note reviewed.  Constitutional:      Appearance: Normal appearance.  Cardiovascular:     Rate and Rhythm: Normal rate.     Pulses: Normal pulses.  Pulmonary:     Effort: Pulmonary effort is normal.  Abdominal:     General: Abdomen is flat.  Neurological:     General: No focal deficit present.     Mental Status: She is alert and oriented to person, place, and time.  Psychiatric:        Mood and Affect: Mood normal.        Behavior: Behavior normal.       Assessment & Plan:     ICD-10-CM   1. Atypical ductal hyperplasia of right breast  N60.91     2. Acquired absence of both breasts  Z90.13       The patient was offered physical therapy but she like to hold off for now because she is so busy with her parents.  We will plan on seeing her in November.  I feel like what I see today she is doing really well and I do not have any areas of concern.  Physical therapy is always an option.  She agrees with this plan will  let us know if anything changes.  Pictures were obtained of the patient and placed in the chart with the patient's or guardian's permission.

## 2020-09-30 ENCOUNTER — Ambulatory Visit: Payer: Managed Care, Other (non HMO) | Admitting: Hematology and Oncology

## 2020-10-01 ENCOUNTER — Ambulatory Visit: Payer: Managed Care, Other (non HMO) | Admitting: Hematology and Oncology

## 2020-12-08 ENCOUNTER — Other Ambulatory Visit: Payer: Self-pay

## 2020-12-08 ENCOUNTER — Ambulatory Visit (INDEPENDENT_AMBULATORY_CARE_PROVIDER_SITE_OTHER): Payer: Managed Care, Other (non HMO) | Admitting: Surgical

## 2020-12-08 ENCOUNTER — Telehealth: Payer: Self-pay | Admitting: *Deleted

## 2020-12-08 ENCOUNTER — Encounter: Payer: Self-pay | Admitting: Surgical

## 2020-12-08 DIAGNOSIS — Z9013 Acquired absence of bilateral breasts and nipples: Secondary | ICD-10-CM

## 2020-12-08 DIAGNOSIS — N6091 Unspecified benign mammary dysplasia of right breast: Secondary | ICD-10-CM

## 2020-12-08 NOTE — Telephone Encounter (Signed)
Faxed order, demographics,insurance card, and recent office notes to [facility] for mastectomy supplies for the patient.  Confirmation received and copy scanned into the chart.//AB/CMA

## 2020-12-08 NOTE — Progress Notes (Signed)
Subjective:     Patient ID: Lindsay Rios, female    DOB: 06-28-1965, 55 y.o.   MRN: [id number]  Chief Complaint  Patient presents with   Follow-up   HPI: The patient is a 55 y.o. female here for follow-up on her bilateral breast reconstruction.  She most recently underwent removal of bilateral tissue expanders and placement of bilateral breast implants with Dr. Marla Roe on 08/12/2020.  She is approximately 4 months postop from this. She had a history of 2 right breast lumpectomies prior to her bilateral mastectomies.  She had a high lifetime risk of breast cancer and therefore decided on bilateral mastectomies.  In her left breast she had Mentor smooth round high-profile gel 450 cc placed. In her right breast she had Mentor smooth round ultrahigh profile gel 650 cc placed  She reports today that her father who is 94 years old was recently diagnosed with esophageal cancer and is going to start chemotherapy and radiation next week.  She is his primary caregiver.  We have previously talked about fat grafting to her right breast for some volume loss medially and she is interested in this, but would like to know if this is something that can be done next year due to her father's recent diagnosis.   Review of Systems  Constitutional: Negative.   Skin: Negative.     Objective:   Vital Signs There were no vitals taken for this visit. Vital Signs and Nursing Note Reviewed Chaperone present Physical Exam Constitutional:      General: She is not in acute distress.    Appearance: Normal appearance. She is normal weight. She is not ill-appearing.  HENT:     Head: Normocephalic and atraumatic.  Pulmonary:     Effort: Pulmonary effort is normal.  Chest:     Comments: Bilateral NAC's are viable, bilateral breast incisions are intact and healing well.  No erythema or cellulitic changes noted.  She does have some volume loss along the superior medial border of the right breast, this  is more pronounced when she is lying flat. Abdominal:     General: Abdomen is flat.  Skin:    General: Skin is warm and dry.     Capillary Refill: Capillary refill takes less than 2 seconds.  Neurological:     Mental Status: She is alert and oriented to person, place, and time.  Psychiatric:        Mood and Affect: Mood normal.        Behavior: Behavior normal.      Assessment/Plan:     ICD-10-CM   1. Atypical ductal hyperplasia of right breast  N60.91     2. Acquired absence of both breasts  Z90.70       55 year old female status post bilateral breast reconstruction, currently has bilateral silicone implants in place.  She overall has good shape and symmetry, however has some volume loss along the medial right breast.  This contour abnormality bothers her and she is interested in some improvement in this.  We discussed liposuction of abdomen and fat grafting would be a possibility, I discussed with her that this is something that could be done at any time and does not necessarily have to be with them this year.  This was relieving for her given that she has a lot of life changes going on at this time.  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  We discussed scheduling additional appointments or calling whenever she has availability to discuss fat grafting.  She would like to call to schedule her appointment whenever she has more availability.  Recommend calling with questions or concerns.  Pictures were obtained of the patient and placed in the chart with the patient's or guardian's permission.   Carola Rhine Detavious Rinn, PA-C 12/08/2020, 3:04 PM

## 2020-12-15 ENCOUNTER — Telehealth: Payer: Self-pay | Admitting: *Deleted

## 2020-12-15 NOTE — Telephone Encounter (Signed)
Received DME Standard Written Order Waynetta Sandy) via of fax from [facility] on (12/09/20).  Requesting signature and return.  Given to provider to sign and return.  Order signed and faxed back to [facility].  Confirmation received and copy scanned into the chart.//AB/CMA

## 2020-12-27 ENCOUNTER — Telehealth: Payer: Self-pay | Admitting: *Deleted

## 2020-12-27 NOTE — Telephone Encounter (Signed)
Received call from pt with compliant of left nipple pain x several weeks.  Pt denies redness, warmth, drainage or recent injury.  Pt states anxiety is high with hx of breast cancer and would prefer to have a breast exam.  Appt scheduled and pt verbalized understanding of date and time.

## 2021-01-04 ENCOUNTER — Inpatient Hospital Stay: Payer: Managed Care, Other (non HMO) | Attending: Adult Health | Admitting: Adult Health

## 2021-01-04 ENCOUNTER — Other Ambulatory Visit: Payer: Self-pay

## 2021-01-04 VITALS — BP 129/76 | HR 100 | Temp 97.3°F | Resp 17 | Ht 63.0 in | Wt 153.4 lb

## 2021-01-04 DIAGNOSIS — Z7985 Long-term (current) use of injectable non-insulin antidiabetic drugs: Secondary | ICD-10-CM | POA: Insufficient documentation

## 2021-01-04 DIAGNOSIS — Z79899 Other long term (current) drug therapy: Secondary | ICD-10-CM | POA: Insufficient documentation

## 2021-01-04 DIAGNOSIS — N6091 Unspecified benign mammary dysplasia of right breast: Secondary | ICD-10-CM | POA: Diagnosis not present

## 2021-01-04 DIAGNOSIS — N6011 Diffuse cystic mastopathy of right breast: Secondary | ICD-10-CM | POA: Insufficient documentation

## 2021-01-04 DIAGNOSIS — Z7982 Long term (current) use of aspirin: Secondary | ICD-10-CM | POA: Insufficient documentation

## 2021-01-04 DIAGNOSIS — Z9013 Acquired absence of bilateral breasts and nipples: Secondary | ICD-10-CM | POA: Diagnosis not present

## 2021-01-04 DIAGNOSIS — R921 Mammographic calcification found on diagnostic imaging of breast: Secondary | ICD-10-CM | POA: Diagnosis not present

## 2021-01-04 DIAGNOSIS — Z7984 Long term (current) use of oral hypoglycemic drugs: Secondary | ICD-10-CM | POA: Diagnosis not present

## 2021-01-04 NOTE — Progress Notes (Signed)
Patient Care Team: [name], [place] as PCP - General (Nurse Practitioner)  DIAGNOSIS:  No diagnosis found.   CHIEF COMPLIANT: Follow-up for high risk for breast cancer, s/p bilateral mastectomies  INTERVAL HISTORY: Lindsay Rios is a 55 y.o. woman who had high risk for breast cancer is status post bilateral mastectomies.  She followed up with Dr. Payton Mccallum earlier this year who reviewed that she no longer requires risk reduction therapy in light of the fact that she has had bilateral mastectomies.  She notes that in her left mastectomy site where she had the nipple sparing mastectomies that she has had tingling and an itching feeling that is concerning to her.  This is She is not sure what she is supposed to feel.  She notes that there is some minor small tiny amount of breast tissue within the nipple and this concerns her about possibly being cancer in the area.  She denies any pain, swelling, or nodularity in that area.  ALLERGIES:  is allergic to other and sulfa antibiotics.  MEDICATIONS:  Current Outpatient Medications  Medication Sig Dispense Refill   aspirin 81 MG chewable tablet Chew 81 mg by mouth daily.     cetirizine (ZYRTEC) 10 MG tablet Take 10 mg by mouth daily.     Cholecalciferol (PA VITAMIN D-3 GUMMY PO) Chew one (1) gummy by mouth daily.     empagliflozin (JARDIANCE) 25 MG TABS tablet Take 25 mg by mouth daily.     glycopyrrolate (ROBINUL) 2 MG tablet Take 1 tablet (2 mg total) by mouth 2 (two) times daily. 180 tablet 3   liraglutide (VICTOZA) 18 MG/3ML SOPN Inject 0.8 mg into the skin every morning.     lisdexamfetamine (VYVANSE) 20 MG capsule Take 20 mg by mouth daily.     lisinopril (ZESTRIL) 10 MG tablet Take 1 tablet (10 mg total) by mouth daily. 90 tablet 3   Loperamide HCl (IMODIUM PO) Take 1-2 tablets by mouth daily.     Multiple Vitamins-Minerals (ALIVE WOMENS GUMMY PO) Chew one (1) gummy by mouth daily.     nitroGLYCERIN (NITROSTAT) 0.4 MG SL tablet  Place 1 tablet (0.4 mg total) under the tongue every 5 (five) minutes as needed for chest pain. (Patient not taking: Reported on 01/04/2021) 30 tablet 12   rosuvastatin (CRESTOR) 20 MG tablet Take 20 mg by mouth daily.     SEMGLEE, YFGN, 100 UNIT/ML Pen Inject into the skin.     Current Facility-Administered Medications  Medication Dose Route Frequency Provider Last Rate Last Admin   0.9 %  sodium chloride infusion  500 mL Intravenous Once Ladene Artist, MD        PHYSICAL EXAMINATION: ECOG PERFORMANCE STATUS: 1 - Symptomatic but completely ambulatory  Vitals:   01/04/21 1402  BP: 129/76  Pulse: 100  Resp: 17  Temp: (!) 97.3 F (36.3 C)  SpO2: 97%   Filed Weights   01/04/21 1402  Weight: 153 lb 6.4 oz (69.6 kg)    GENERAL: Patient is a well appearing female in no acute distress HEENT:  Sclerae anicteric.  Normocephalic NODES:  No cervical, supraclavicular, or axillary lymphadenopathy palpated.  BREAST EXAM: Status post bilateral mastectomies with reconstruction.  Particularly I feel no abnormality over underneath either areola S:  No peripheral edema.   SKIN:  Clear with no obvious rashes or skin changes. No nail dyscrasia. NEURO:  Nonfocal. Well oriented.  Appropriate affect.   LABORATORY DATA:  I have reviewed the data as listed  CMP Latest Ref Rng & Units 08/06/2020 04/15/2020 06/02/2019  Glucose 70 - 99 mg/dL 176(H) 141(H) 103(H)  BUN 6 - 20 mg/dL 11 13 12   Creatinine 0.44 - 1.00 mg/dL 0.64 0.65 0.74  Sodium 135 - 145 mmol/L 139 138 139  Potassium 3.5 - 5.1 mmol/L 4.2 4.0 3.4(L)  Chloride 98 - 111 mmol/L 105 106 101  CO2 22 - 32 mmol/L 23 27 27   Calcium 8.9 - 10.3 mg/dL 9.3 8.8(L) 9.8  Total Protein 6.0 - 8.3 g/dL - - -  Total Bilirubin 0.2 - 1.2 mg/dL - - -  Alkaline Phos 39 - 117 U/L - - -  AST 0 - 37 U/L - - -  ALT 0 - 35 U/L - - -    Lab Results  Component Value Date   WBC 9.8 04/12/2016   HGB 16.0 (H) 04/12/2016   HCT 45.8 04/12/2016   MCV 83.6 04/12/2016    PLT 257 04/12/2016   NEUTROABS 6.4 08/27/2014    ASSESSMENT & PLAN:  Atypical ductal hyperplasia of right breast Right breast lumpectomy: 4:00 and 5:00 positions: Focal ADH with calcifications, fibrocystic change Prognosis:Using the [employer] calculator, breast cancer risk: 10-year risk: 11% and lifetime risk 32% Prior treatment: Tamoxifen 20 mg daily started 07/03/2019 stopped 04/19/2020 (due to severe bilateral hand and feet cramping) 04/19/2020: Bilateral mastectomies with reconstruction: Right mastectomy: ADH, ALH; left mastectomy: CSL with ADH, ALH  Lindsay Rios is here today for follow-up for concerns at her mastectomy site.  I reviewed with her that indeed she is at incredibly low risk for developing breast cancer in the left nipple.  I reviewed that that risk is so low that the benefit of antiestrogen therapy does not outweigh the risk of side effects from antiestrogen therapies.  Considering these new sensations and pain offered to order an ultrasound that she could have completed at the breast center and reviewed this concern with her radiologist.  We did review also that her physical exam was normal.  She would like to wait on the ultrasound and states that if anything worsens she will call and we can order that.  I think this is okay.  She will continue to follow-up with [place] on an as-needed basis, and will continue to see Dr. Elisabeth Cara who is doing her reconstruction.  Total time spent: 20 mins including face to face time and time spent for planning, charting and coordination of care  Wilber Bihari, NP 01/04/21 2:26 PM Medical Oncology and Hematology [facility] [place], [place] [address] Tel. [phone]    Fax. [phone]  *Total Encounter Time as defined by the Centers for Medicare and Medicaid Services includes, in addition to the face-to-face time of a patient visit (documented in the note above) non-face-to-face time: obtaining and reviewing  outside history, ordering and reviewing medications, tests or procedures, care coordination (communications with other health care professionals or caregivers) and documentation in the medical record.

## 2021-03-06 ENCOUNTER — Encounter (HOSPITAL_BASED_OUTPATIENT_CLINIC_OR_DEPARTMENT_OTHER): Payer: Self-pay

## 2021-03-06 ENCOUNTER — Emergency Department (HOSPITAL_BASED_OUTPATIENT_CLINIC_OR_DEPARTMENT_OTHER)
Admission: EM | Admit: 2021-03-06 | Discharge: 2021-03-06 | Disposition: A | Discharge status: Home / Self Care | Payer: Managed Care, Other (non HMO) | Attending: Emergency Medicine | Admitting: Emergency Medicine

## 2021-03-06 ENCOUNTER — Other Ambulatory Visit: Payer: Self-pay

## 2021-03-06 ENCOUNTER — Emergency Department (HOSPITAL_COMMUNITY): Payer: Managed Care, Other (non HMO)

## 2021-03-06 DIAGNOSIS — Z79899 Other long term (current) drug therapy: Secondary | ICD-10-CM | POA: Diagnosis not present

## 2021-03-06 DIAGNOSIS — I10 Essential (primary) hypertension: Secondary | ICD-10-CM | POA: Diagnosis not present

## 2021-03-06 DIAGNOSIS — Z794 Long term (current) use of insulin: Secondary | ICD-10-CM | POA: Insufficient documentation

## 2021-03-06 DIAGNOSIS — E119 Type 2 diabetes mellitus without complications: Secondary | ICD-10-CM | POA: Insufficient documentation

## 2021-03-06 DIAGNOSIS — Z7982 Long term (current) use of aspirin: Secondary | ICD-10-CM | POA: Diagnosis not present

## 2021-03-06 DIAGNOSIS — R42 Dizziness and giddiness: Secondary | ICD-10-CM

## 2021-03-06 LAB — CBG MONITORING, ED: Glucose-Capillary: 125 mg/dL — ABNORMAL HIGH (ref 70–99)

## 2021-03-06 LAB — CBC
HCT: 44 % (ref 36.0–46.0)
Hemoglobin: 14.7 g/dL (ref 12.0–15.0)
MCH: 27.9 pg (ref 26.0–34.0)
MCHC: 33.4 g/dL (ref 30.0–36.0)
MCV: 83.7 fL (ref 80.0–100.0)
Platelets: 210 10*3/uL (ref 150–400)
RBC: 5.26 MIL/uL — ABNORMAL HIGH (ref 3.87–5.11)
RDW: 12.9 % (ref 11.5–15.5)
WBC: 6.2 10*3/uL (ref 4.0–10.5)
nRBC: 0 % (ref 0.0–0.2)

## 2021-03-06 LAB — BASIC METABOLIC PANEL
Anion gap: 8 (ref 5–15)
BUN: 13 mg/dL (ref 6–20)
CO2: 26 mmol/L (ref 22–32)
Calcium: 9.2 mg/dL (ref 8.9–10.3)
Chloride: 107 mmol/L (ref 98–111)
Creatinine, Ser: 0.55 mg/dL (ref 0.44–1.00)
GFR, Estimated: >60 mL/min (ref >60)
Glucose, Bld: 126 mg/dL — ABNORMAL HIGH (ref 70–99)
Potassium: 3.8 mmol/L (ref 3.5–5.1)
Sodium: 141 mmol/L (ref 135–145)

## 2021-03-06 IMAGING — MR MR HEAD W/O CM
12 of 13 series · 44 of 48 positions shown · non-contrast
Comparison: Head CT [DATE].

CLINICAL DATA: Provided history: Dizziness, persistent/recurrent,
cardiac or vascular cause suspected; evaluate for cerebellar lesion.
Vertigo for several months.

EXAM:
MRI HEAD WITHOUT CONTRAST
TECHNIQUE: Multiplanar, multiecho pulse sequences of the brain and surrounding
structures were obtained without intravenous contrast.

[Series 5: DWI · axial · 3.0mm · 0.88mm/px · z∈[-116,+24]mm · 8 of 96 slices shown (1 of 4)]
[im 1/96]
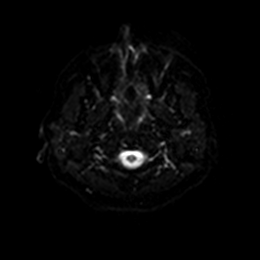
[im 14/96]
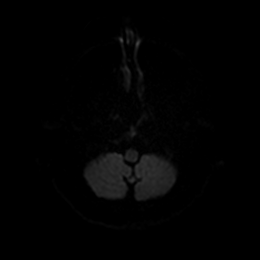
[im 28/96]
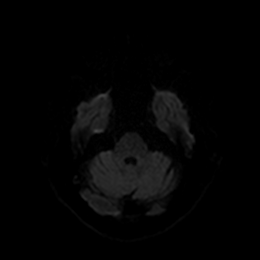
[im 41/96]
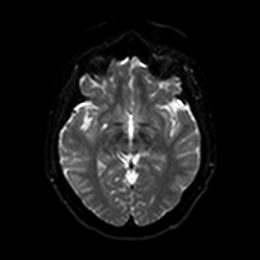
[im 55/96]
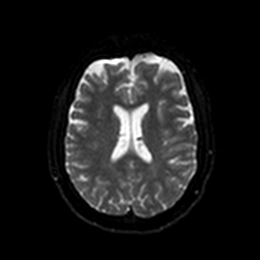
[im 68/96]
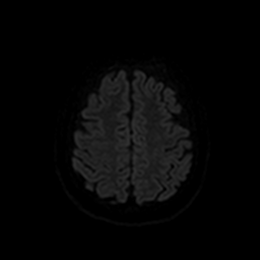
[im 82/96]
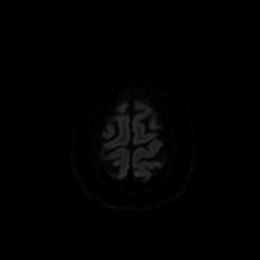
[im 96/96]
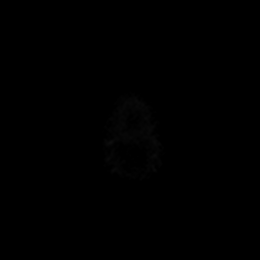

[Series 6: DWI · axial · 3.0mm · 0.88mm/px · z∈[-116,+24]mm · 4 of 48 slices shown (2 of 4)]
[im 1/48]
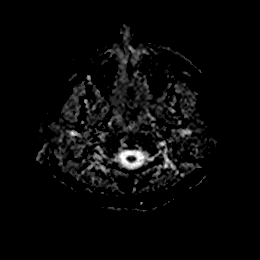
[im 16/48]
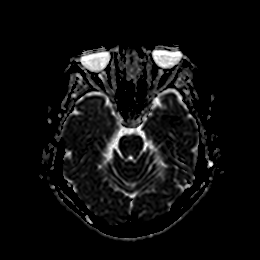
[im 32/48]
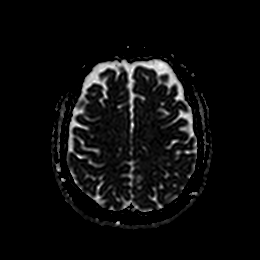
[im 48/48]
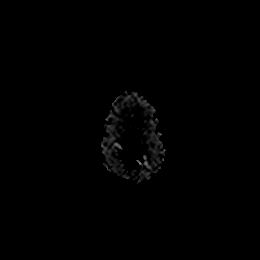

[Series 7: DWI · coronal · 4.0mm · 0.88mm/px · 5 of 64 slices shown (3 of 4)]
[im 1/64]
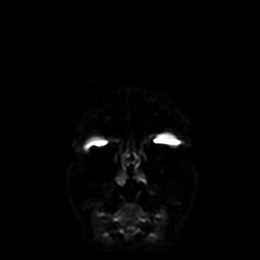
[im 16/64]
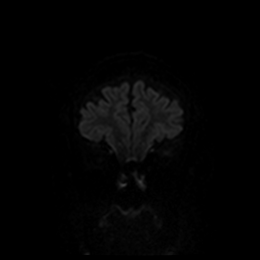
[im 32/64]
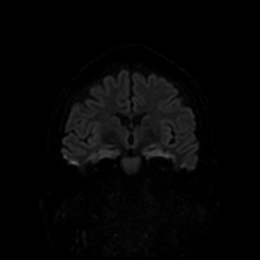
[im 48/64]
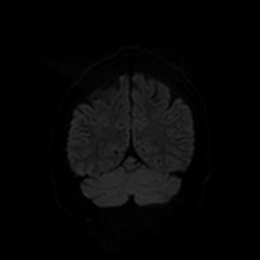
[im 64/64]
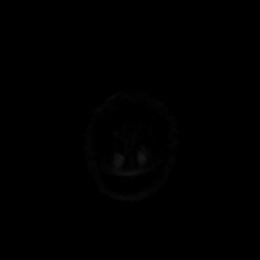

[Series 8: DWI · coronal · 4.0mm · 0.88mm/px · 3 of 32 slices shown (4 of 4)]
[im 1/32]
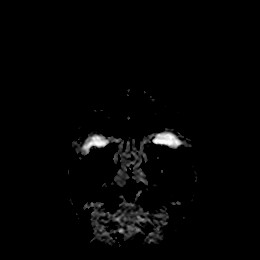
[im 16/32]
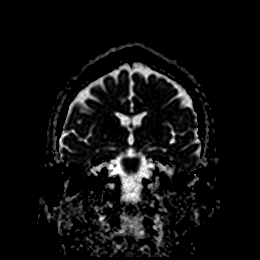
[im 32/32]
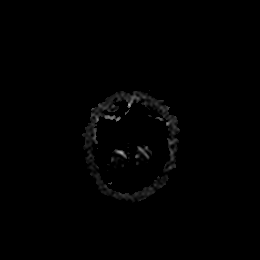

[Series 9: T1 · sagittal · 5.0mm · 0.75mm/px · 2 of 24 slices shown]
[im 1/24]
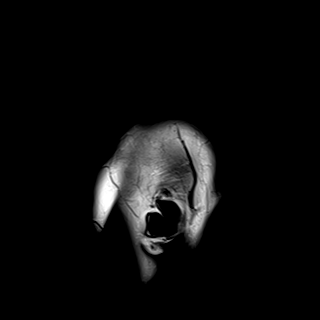
[im 24/24]
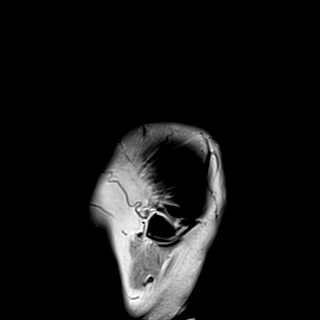

[Series 10: T2 · axial · 5.0mm · 0.72mm/px · z∈[-121,+23]mm · 2 of 25 slices shown (1 of 2)]
[im 1/25]
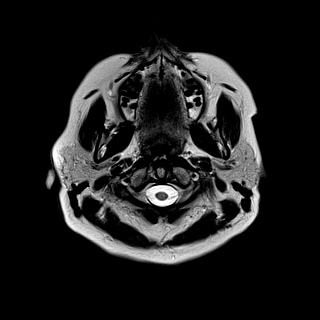
[im 25/25]
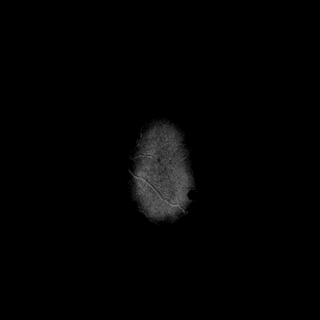

[Series 11: FLAIR · axial · 5.0mm · 0.45mm/px · z∈[-121,+22]mm · 2 of 25 slices shown]
[im 1/25]
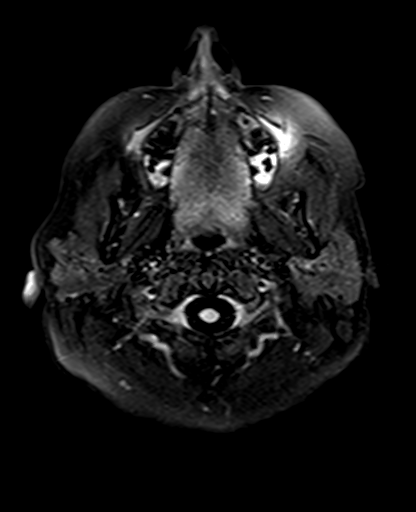
[im 25/25]
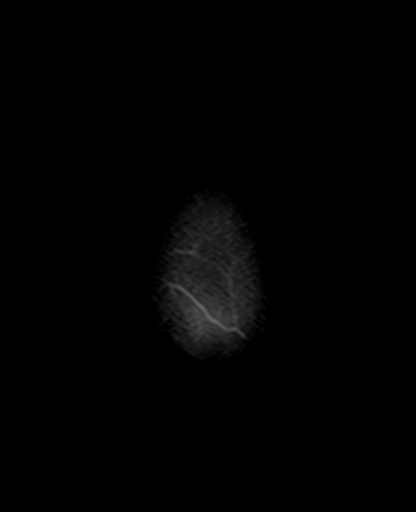

[Series 12: mag_images · axial · 3.0mm · 0.90mm/px · z∈[-128,+24]mm · 4 of 52 slices shown]
[im 1/52]
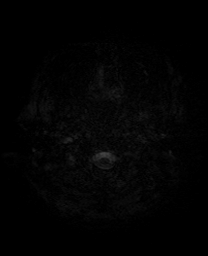
[im 18/52]
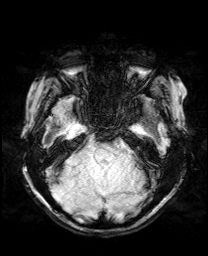
[im 35/52]
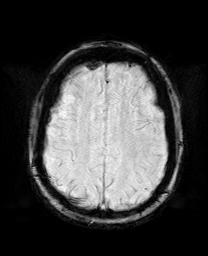
[im 52/52]
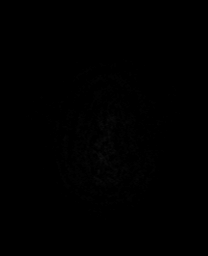

[Series 13: pha_images · axial · 3.0mm · 0.90mm/px · z∈[-128,+21]mm · 4 of 51 slices shown]
[im 1/51]
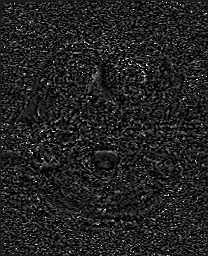
[im 17/51]
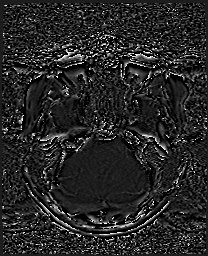
[im 34/51]
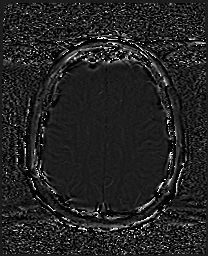
[im 51/51]
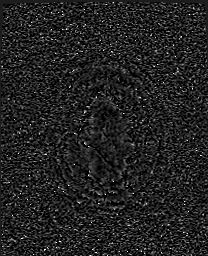

[Series 14: swi_images · axial · 3.0mm · 0.90mm/px · z∈[-128,+24]mm · 4 of 52 slices shown]
[im 1/52]
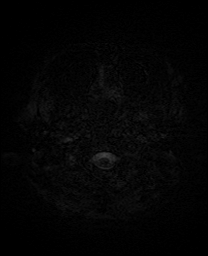
[im 18/52]
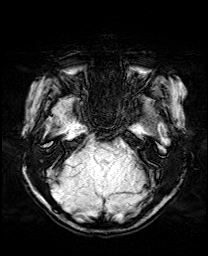
[im 35/52]
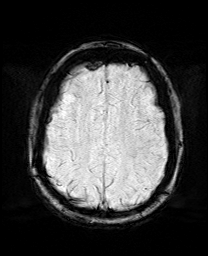
[im 52/52]
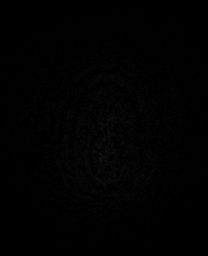

[Series 15: mip_images(sw) · axial · 24.0mm · 0.90mm/px · z∈[-118,+14]mm · 4 of 45 slices shown]
[im 1/45]
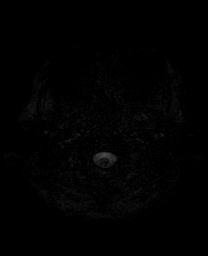
[im 15/45]
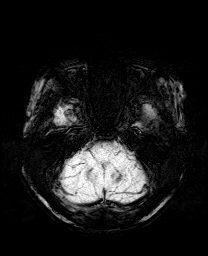
[im 30/45]
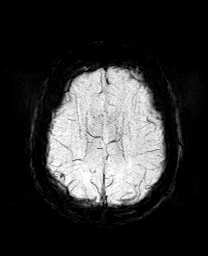
[im 45/45]
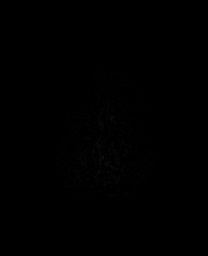

[Series 17: T2 · coronal · 5.0mm · 0.34mm/px · 2 of 29 slices shown (2 of 2)]
[im 1/29]
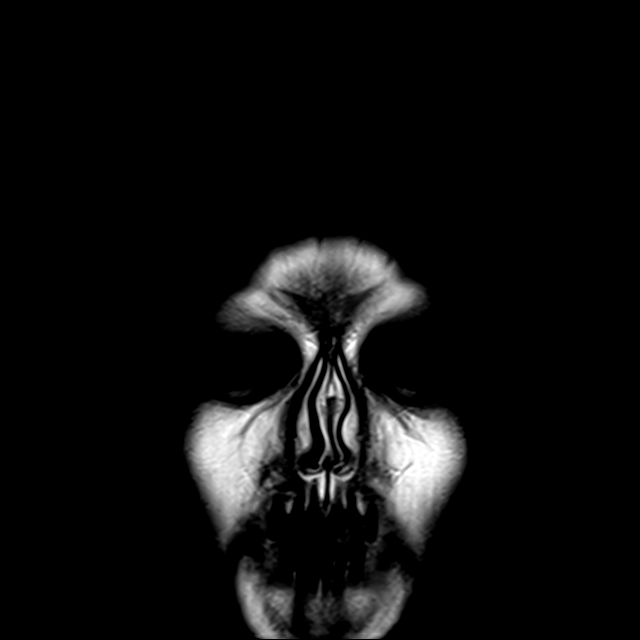
[im 29/29]
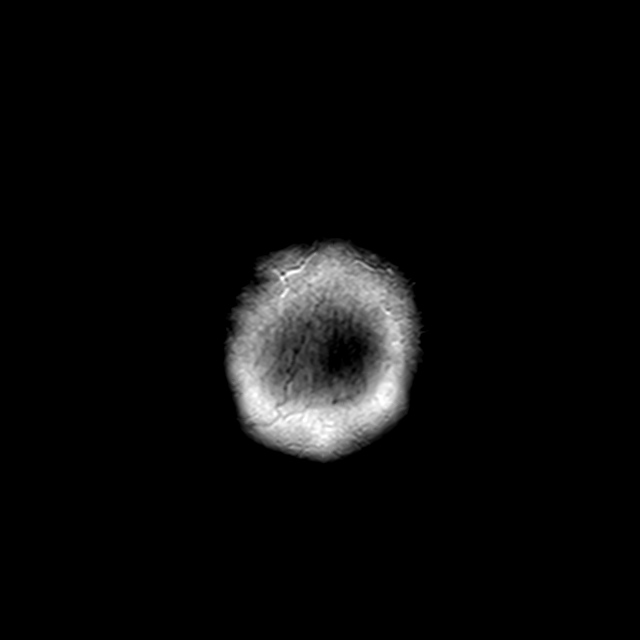

[44 of 48 positions shown; findings below may reference images not displayed]

FINDINGS: Brain:

Mild intermittent motion degradation.

Cerebral volume is normal.

No cortical encephalomalacia is identified. No significant cerebral
white matter disease.

There is no acute infarct.

No evidence of an intracranial mass.

No chronic intracranial blood products.

No extra-axial fluid collection.

No midline shift.

Vascular: Maintained flow voids within the proximal large arterial
vessels. Curvilinear focus of SWI signal loss within the right
parietal lobe suspicious for a developmental venous anomaly
(anatomic variant).

Skull and upper cervical spine: No focal suspicious marrow lesion.

Sinuses/Orbits: Visualized orbits show no acute finding. Trace
mucosal thickening within the bilateral ethmoid sinuses.
IMPRESSION: Mildly motion degraded exam.

Curvilinear focus of susceptibility-weighted signal loss within the
right parietal lobe, suspicious for a developmental venous anomaly
(anatomic variant).

Otherwise unremarkable non-contrast MRI appearance of the brain. No
evidence of acute intracranial abnormality.

Trace mucosal thickening within the bilateral ethmoid sinuses.

## 2021-03-06 MED ORDER — MECLIZINE HCL 25 MG PO TABS: 25.0000 mg | ORAL_TABLET | Freq: Three times a day (TID) | ORAL | 0 refills | Status: DC | PRN

## 2021-03-06 MED ORDER — MECLIZINE HCL 25 MG PO TABS
50.0000 mg | ORAL_TABLET | Freq: Once | ORAL | Status: AC
  Administered 2021-03-06: 50 mg via ORAL
  Filled 2021-03-06: qty 2

## 2021-03-06 NOTE — ED Provider Notes (Signed)
[facility] DE[facility] Provider Note   CSN: [id number] Arrival date & time: 03/06/21  0841     History  CC: Vertigo  Lindsay Rios is a 56 y.o. female presented to ED in the company of her husband with complaint of ongoing vertigo.  She reports she has had the symptoms for months or years.  They tend to occur episodically, but for the past week it been more severe.  She states when she closes her eyes it feels that the room is spinning around her.  Often when she turns her head sharply, she will feel like she is listing to that side.  She says she cannot close her eyes and maintain her balance when standing.  She feels like she is "drunk when I am walking".  She has not been on any medications for this, has not tried meclizine, has not been diagnosed with vertigo in the past or seen an ENT specialist.  She does report that she has had "ringing" in her ears for some time.  She cannot recall which ear this is then.  She denies loss of hearing.  She denies any history of TIA or stroke but it does run in her family, her father had a cerebellar stroke.  The patient has a history of hypertension, hyperlipidemia, and diabetes.  She denies history of arrhythmia.    HPI     Home Medications Prior to Admission medications   Medication Sig Start Date End Date Taking? Authorizing Provider  meclizine (ANTIVERT) 25 MG tablet Take 1 tablet (25 mg total) by mouth 3 (three) times daily as needed for up to 30 doses for dizziness. 03/06/21  Yes Wyvonnia Dusky, MD  aspirin 81 MG chewable tablet Chew 81 mg by mouth daily.    [provider]  cetirizine (ZYRTEC) 10 MG tablet Take 10 mg by mouth daily.    [provider]  Cholecalciferol (PA VITAMIN D-3 GUMMY PO) Chew one (1) gummy by mouth daily.    [provider]  empagliflozin (JARDIANCE) 25 MG TABS tablet Take 25 mg by mouth daily.    [provider]  glycopyrrolate (ROBINUL) 2 MG tablet Take  1 tablet (2 mg total) by mouth 2 (two) times daily. 12/16/19   Ladene Artist, MD  liraglutide (VICTOZA) 18 MG/3ML SOPN Inject 0.8 mg into the skin every morning.    [provider]  lisdexamfetamine (VYVANSE) 20 MG capsule Take 20 mg by mouth daily.    [provider]  lisinopril (ZESTRIL) 10 MG tablet Take 1 tablet (10 mg total) by mouth daily. 12/09/18   Josue Hector, MD  Loperamide HCl (IMODIUM PO) Take 1-2 tablets by mouth daily.    [provider]  Multiple Vitamins-Minerals (ALIVE WOMENS GUMMY PO) Chew one (1) gummy by mouth daily.    [provider]  nitroGLYCERIN (NITROSTAT) 0.4 MG SL tablet Place 1 tablet (0.4 mg total) under the tongue every 5 (five) minutes as needed for chest pain. Patient not taking: Reported on 01/04/2021 12/20/13   Lendon Colonel, NP  rosuvastatin (CRESTOR) 20 MG tablet Take 20 mg by mouth daily.    [provider]  SEMGLEE, YFGN, 100 UNIT/ML Pen Inject into the skin. 11/28/20   [provider]      Allergies    Other and Sulfa antibiotics    Review of Systems   Review of Systems  Physical Exam Updated Vital Signs BP (!) 141/85 (BP Location: Left Arm)  Pulse 66    Temp 97.8 F (36.6 C) (Oral)    Resp 20    Ht 5\' 3"  (1.6 m)    Wt 63.6 kg    SpO2 97%    BMI 24.84 kg/m  Physical Exam Constitutional:      General: She is not in acute distress. HENT:     Head: Normocephalic and atraumatic.     Right Ear: Tympanic membrane, ear canal and external ear normal. There is no impacted cerumen.     Left Ear: Tympanic membrane, ear canal and external ear normal. There is no impacted cerumen.  Eyes:     Conjunctiva/sclera: Conjunctivae normal.     Pupils: Pupils are equal, round, and reactive to light.  Cardiovascular:     Rate and Rhythm: Normal rate and regular rhythm.     Pulses: Normal pulses.  Pulmonary:     Effort: Pulmonary effort is normal. No respiratory distress.  Skin:    General: Skin  is warm and dry.  Neurological:     General: No focal deficit present.     Mental Status: She is alert and oriented to person, place, and time. Mental status is at baseline.     Cranial Nerves: No cranial nerve deficit.     Sensory: No sensory deficit.     Comments: Unilateral left beating nystagmus with rightward eye gaze Equivocal head impulse test Patient is able to ambulate with a wide-based, mildly ataxic gait in the room. She is unable to perform Romberg testing due to balance difficulty (abnormal Romberg) No difficulty with finger-to-nose, or with heel-to-shin  Psychiatric:        Mood and Affect: Mood normal.        Behavior: Behavior normal.    ED Results / Procedures / Treatments   Labs (all labs ordered are listed, but only abnormal results are displayed) Labs Reviewed  CBG MONITORING, ED - Abnormal; Notable for the following components:      Result Value   Glucose-Capillary 125 (*)    All other components within normal limits  BASIC METABOLIC PANEL  CBC    EKG None  Radiology No results found.  Procedures Procedures    Medications Ordered in ED Medications  meclizine (ANTIVERT) tablet 50 mg (has no administration in time range)    ED Course/ Medical Decision Making/ A&P Clinical Course as of 03/06/21 8366  Sun Mar 06, 2021  0925 Accepted for transfer to [facility] ED by Dr Dene Gentry, with plan for MRI.  If no acute CVA, could be discharged on meclizine for peripheral vertigo management with ENT outpatient f/u.  Patient is appropriately stable for her husband to take her by POV.  They verbalized agreement with the plan. [MT]    Clinical Course User Index [MT] Orphia Mctigue, Carola Rhine, MD                           Medical Decision Making Amount and/or Complexity of Data Reviewed Labs: ordered. Radiology: ordered.   Patient to be evaluated for vertigo.  I strongly suspect this is peripheral vertigo, particularly given that she has ringing in her ears, given  how positional her symptoms are.  If her work-up is negative I recommend that she follows up with ENT, we can start her on meclizine.  She is already on benzos as prescribed by her PCP for anxiety, and so I would not give additional Valium at this time.  Thankfully her symptoms  are fairly well controlled in the ED, she has no vomiting, she is able to ambulate.  However, because she does have stroke risk factors, she has an equivocal hints exam, I discussed with the patient and her husband going to [facility] ED for an MRI of the brain to rule out cerebellar lesions as a cause for her vertigo.  They are in agreement with this plan.  They can go by private vehicle.  She has no other ongoing stroke symptoms.         Final Clinical Impression(s) / ED Diagnoses Final diagnoses:  Vertigo    Rx / DC Orders ED Discharge Orders          Ordered    meclizine (ANTIVERT) 25 MG tablet  3 times daily PRN        03/06/21 0929              Wyvonnia Dusky, MD 03/06/21 [phone]

## 2021-03-06 NOTE — ED Notes (Signed)
Patient transported to MRI 

## 2021-03-06 NOTE — ED Notes (Signed)
Discharge instructions reviewed with patient. Patient verbalized understanding of instructions. Follow-up care and medications were reviewed. Patient ambulatory with steady gait. VSS upon discharge.  ?

## 2021-03-06 NOTE — ED Provider Notes (Addendum)
Seen upon arrival after transfer from  [facility].  Patient reports improvement in vertiginous symptoms after administration of meclizine.  She is comfortably awaiting MRI imaging  1525 MRI is without evidence of acute stroke.  Patient is significantly improved.  She desires DC home.  She does understand need for close follow-up.  Strict return precautions given and understood.    Valarie Merino, MD 03/06/21 1249    Valarie Merino, MD 03/06/21 [phone]

## 2021-03-06 NOTE — ED Triage Notes (Signed)
Patient arrives via POV with complaints of dizziness. Patient reports that the dizziness has been intermittent for years, but the past couple days the symptoms have worsened. The patient reports she can be laying down with her eyes closed and the room will be spinning. Patient also reports feeling off balance for months.

## 2021-03-06 NOTE — ED Notes (Signed)
Pt and husband verbalized understanding of transfer to [facility] for further testing/MRI. OTF at this time. Transfer via POV

## 2021-03-06 NOTE — ED Notes (Addendum)
Report Called to [facility] ED [facility]). Accepting physician: Dr. Rhina Brackett

## 2021-03-06 NOTE — Discharge Instructions (Addendum)
Return for any problem.  ?

## 2021-04-01 ENCOUNTER — Encounter (HOSPITAL_BASED_OUTPATIENT_CLINIC_OR_DEPARTMENT_OTHER): Payer: Self-pay | Admitting: Emergency Medicine

## 2021-04-01 ENCOUNTER — Other Ambulatory Visit: Payer: Self-pay

## 2021-04-01 ENCOUNTER — Other Ambulatory Visit (HOSPITAL_BASED_OUTPATIENT_CLINIC_OR_DEPARTMENT_OTHER): Payer: Self-pay

## 2021-04-01 ENCOUNTER — Emergency Department (HOSPITAL_BASED_OUTPATIENT_CLINIC_OR_DEPARTMENT_OTHER)
Admission: EM | Admit: 2021-04-01 | Discharge: 2021-04-01 | Disposition: A | Discharge status: Home / Self Care | Payer: Managed Care, Other (non HMO) | Attending: Emergency Medicine | Admitting: Emergency Medicine

## 2021-04-01 DIAGNOSIS — E119 Type 2 diabetes mellitus without complications: Secondary | ICD-10-CM | POA: Insufficient documentation

## 2021-04-01 DIAGNOSIS — R1110 Vomiting, unspecified: Secondary | ICD-10-CM | POA: Diagnosis present

## 2021-04-01 DIAGNOSIS — R197 Diarrhea, unspecified: Secondary | ICD-10-CM | POA: Insufficient documentation

## 2021-04-01 DIAGNOSIS — Z7984 Long term (current) use of oral hypoglycemic drugs: Secondary | ICD-10-CM | POA: Diagnosis not present

## 2021-04-01 DIAGNOSIS — Z7982 Long term (current) use of aspirin: Secondary | ICD-10-CM | POA: Insufficient documentation

## 2021-04-01 LAB — CBC
HCT: 43 % (ref 36.0–46.0)
Hemoglobin: 14.5 g/dL (ref 12.0–15.0)
MCH: 27.9 pg (ref 26.0–34.0)
MCHC: 33.7 g/dL (ref 30.0–36.0)
MCV: 82.7 fL (ref 80.0–100.0)
Platelets: 171 10*3/uL (ref 150–400)
RBC: 5.2 MIL/uL — ABNORMAL HIGH (ref 3.87–5.11)
RDW: 12.6 % (ref 11.5–15.5)
WBC: 8.2 10*3/uL (ref 4.0–10.5)
nRBC: 0 % (ref 0.0–0.2)

## 2021-04-01 LAB — COMPREHENSIVE METABOLIC PANEL
ALT: 40 U/L (ref 0–44)
AST: 40 U/L (ref 15–41)
Albumin: 4.4 g/dL (ref 3.5–5.0)
Alkaline Phosphatase: 62 U/L (ref 38–126)
Anion gap: 11 (ref 5–15)
BUN: 11 mg/dL (ref 6–20)
CO2: 23 mmol/L (ref 22–32)
Calcium: 9.2 mg/dL (ref 8.9–10.3)
Chloride: 106 mmol/L (ref 98–111)
Creatinine, Ser: 0.56 mg/dL (ref 0.44–1.00)
GFR, Estimated: >60 mL/min (ref >60)
Glucose, Bld: 180 mg/dL — ABNORMAL HIGH (ref 70–99)
Potassium: 3.5 mmol/L (ref 3.5–5.1)
Sodium: 140 mmol/L (ref 135–145)
Total Bilirubin: 0.5 mg/dL (ref 0.3–1.2)
Total Protein: 7 g/dL (ref 6.5–8.1)

## 2021-04-01 LAB — URINALYSIS, ROUTINE W REFLEX MICROSCOPIC
Bilirubin Urine: NEGATIVE
Glucose, UA: >1000 mg/dL — AB
Hgb urine dipstick: NEGATIVE
Ketones, ur: NEGATIVE mg/dL
Leukocytes,Ua: NEGATIVE
Nitrite: NEGATIVE
Protein, ur: NEGATIVE mg/dL
Specific Gravity, Urine: 1.04 — ABNORMAL HIGH (ref 1.005–1.030)
pH: 5.5 (ref 5.0–8.0)

## 2021-04-01 MED ORDER — ONDANSETRON HCL 4 MG/2ML IJ SOLN
4.0000 mg | Freq: Once | INTRAMUSCULAR | Status: AC
  Administered 2021-04-01: 4 mg via INTRAVENOUS
  Filled 2021-04-01: qty 2

## 2021-04-01 MED ORDER — LOPERAMIDE HCL 2 MG PO CAPS
2.0000 mg | ORAL_CAPSULE | Freq: Four times a day (QID) | ORAL | 0 refills | Status: DC | PRN
  Filled 2021-04-01: qty 12, 3d supply, fill #0

## 2021-04-01 MED ORDER — ONDANSETRON 4 MG PO TBDP
4.0000 mg | ORAL_TABLET | Freq: Three times a day (TID) | ORAL | 0 refills | Status: DC | PRN
  Filled 2021-04-01: qty 4, 2d supply, fill #0

## 2021-04-01 MED ORDER — SODIUM CHLORIDE 0.9 % IV BOLUS
1000.0000 mL | Freq: Once | INTRAVENOUS | Status: AC
  Administered 2021-04-01: 1000 mL via INTRAVENOUS

## 2021-04-01 NOTE — ED Provider Notes (Signed)
?[facility] EM[facility] DEPT ?Provider Note ? ? ?CSN: [id number] ?Arrival date & time: 04/01/21  7124 ? ?  ? ?History ? ?Chief Complaint  ?Patient presents with  ? Dizziness  ? Diarrhea  ? ? ?Lindsay Rios is a 56 y.o. female. ? ?Patient presents with vomiting and diarrhea ongoing since last night.  Describes 2 episodes of vomiting and 3 episodes of diarrhea all nonbloody nonbilious.  Denies any abdominal pain.  Has persistent lightheadedness and dizziness.  She says she has had vertigo for over 2 to 3 months unchanged today.  No reports of fevers or cough no headache no chest pain no abdominal pain. ? ? ?  ? ?Home Medications ?Prior to Admission medications   ?Medication Sig Start Date End Date Taking? Authorizing Provider  ?loperamide (IMODIUM) 2 MG capsule Take 1 capsule (2 mg total) by mouth 4 (four) times daily as needed for diarrhea or loose stools. 04/01/21  Yes Luna Fuse, MD  ?ondansetron (ZOFRAN-ODT) 4 MG disintegrating tablet Take 1 tablet (4 mg total) by mouth every 8 (eight) hours as needed for up to 4 doses for nausea or vomiting. 04/01/21  Yes Luna Fuse, MD  ?aspirin 81 MG chewable tablet Chew 81 mg by mouth daily.    [provider]  ?cetirizine (ZYRTEC) 10 MG tablet Take 10 mg by mouth daily.    [provider]  ?Cholecalciferol (PA VITAMIN D-3 GUMMY PO) Chew one (1) gummy by mouth daily.    [provider]  ?empagliflozin (JARDIANCE) 25 MG TABS tablet Take 25 mg by mouth daily.    [provider]  ?glycopyrrolate (ROBINUL) 2 MG tablet Take 1 tablet (2 mg total) by mouth 2 (two) times daily. 12/16/19   Ladene Artist, MD  ?liraglutide (VICTOZA) 18 MG/3ML SOPN Inject 0.8 mg into the skin every morning.    [provider]  ?lisdexamfetamine (VYVANSE) 20 MG capsule Take 20 mg by mouth daily.    [provider]  ?lisinopril (ZESTRIL) 10 MG tablet Take 1 tablet (10 mg total) by mouth daily. 12/09/18   Josue Hector, MD   ?meclizine (ANTIVERT) 25 MG tablet Take 1 tablet (25 mg total) by mouth 3 (three) times daily as needed for up to 30 doses for dizziness. 03/06/21   Wyvonnia Dusky, MD  ?Multiple Vitamins-Minerals (ALIVE WOMENS GUMMY PO) Chew one (1) gummy by mouth daily.    [provider]  ?nitroGLYCERIN (NITROSTAT) 0.4 MG SL tablet Place 1 tablet (0.4 mg total) under the tongue every 5 (five) minutes as needed for chest pain. ?Patient not taking: Reported on 01/04/2021 12/20/13   Lendon Colonel, NP  ?rosuvastatin (CRESTOR) 20 MG tablet Take 20 mg by mouth daily.    [provider]  ?SEMGLEE, YFGN, 100 UNIT/ML Pen Inject into the skin. 11/28/20   [provider]  ?   ? ?Allergies    ?Other, Sulfa antibiotics, Exenatide, and Metformin hcl   ? ?Review of Systems   ?Review of Systems  ?Constitutional:  Negative for fever.  ?HENT:  Negative for ear pain.   ?Eyes:  Negative for pain.  ?Respiratory:  Negative for cough.   ?Cardiovascular:  Negative for chest pain.  ?Gastrointestinal:  Negative for abdominal pain.  ?Genitourinary:  Negative for flank pain.  ?Musculoskeletal:  Negative for back pain.  ?Skin:  Negative for rash.  ?Neurological:  Negative for headaches.  ? ?Physical Exam ?Updated Vital Signs ?BP (!) 156/122   Pulse 99  Temp 98.6 ?F (37 ?C) (Oral)   Resp 16   Ht 5\' 3"  (1.6 m)   Wt 63.6 kg   SpO2 98%   BMI 24.84 kg/m?  ?Physical Exam ?Constitutional:   ?   General: She is not in acute distress. ?   Appearance: Normal appearance.  ?HENT:  ?   Head: Normocephalic.  ?   Nose: Nose normal.  ?Eyes:  ?   Extraocular Movements: Extraocular movements intact.  ?Cardiovascular:  ?   Rate and Rhythm: Normal rate.  ?Pulmonary:  ?   Effort: Pulmonary effort is normal.  ?Abdominal:  ?   Tenderness: There is no abdominal tenderness. There is no guarding or rebound.  ?Musculoskeletal:     ?   General: Normal range of motion.  ?   Cervical back: Normal range of motion.  ?Neurological:  ?   General: No  focal deficit present.  ?   Mental Status: She is alert and oriented to person, place, and time. Mental status is at baseline.  ?   Cranial Nerves: No cranial nerve deficit.  ?   Motor: No weakness.  ? ? ?ED Results / Procedures / Treatments   ?Labs ?(all labs ordered are listed, but only abnormal results are displayed) ?Labs Reviewed  ?COMPREHENSIVE METABOLIC PANEL - Abnormal; Notable for the following components:  ?    Result Value  ? Glucose, Bld 180 (*)   ? All other components within normal limits  ?CBC - Abnormal; Notable for the following components:  ? RBC 5.20 (*)   ? All other components within normal limits  ?URINALYSIS, ROUTINE W REFLEX MICROSCOPIC - Abnormal; Notable for the following components:  ? Specific Gravity, Urine 1.040 (*)   ? Glucose, UA >1,000 (*)   ? All other components within normal limits  ? ? ?EKG ?EKG Interpretation ? ?Date/Time:  Friday April 01 2021 07:47:13 EST ?Ventricular Rate:  98 ?PR Interval:  203 ?QRS Duration: 100 ?QT Interval:  385 ?QTC Calculation: 492 ?R Axis:   47 ?Text Interpretation: Sinus rhythm Borderline prolonged PR interval Borderline T abnormalities, anterior leads Borderline prolonged QT interval Confirmed by Thamas Jaegers ([id number]) on 04/01/2021 9:26:37 AM ? ?Radiology ?No results found. ? ?Procedures ?Procedures  ? ? ?Medications Ordered in ED ?Medications  ?sodium chloride 0.9 % bolus 1,000 mL (1,000 mLs Intravenous New Bag/Given 04/01/21 0838)  ?ondansetron [facility]) injection 4 mg (4 mg Intravenous Given 04/01/21 0839)  ? ? ?ED Course/ Medical Decision Making/ A&P ?  ?                        ?Medical Decision Making ?Amount and/or Complexity of Data Reviewed ?Labs: ordered. ? ?Risk ?Prescription drug management. ? ? ?Chart review shows outpatient visit for diabetes March 23, 2021. ? ?This patient presents to the ED for concern of vomiting diarrhea dizziness this involves an extensive number of treatment options, and is a complaint that carries with it a high risk of  complications and morbidity.  The differential diagnosis includes electrolyte abnormality, dehydration, stroke, obstruction, other ?  ?  ?Co morbidities that complicate the patient evaluation ?  ?Chronic dizziness. ?  ?  ?Additional history obtained: ?  ?Additional history obtained from husband at bedside. ?  ?  ?Lab Tests: ?  ?I Ordered, and personally interpreted labs.  The pertinent results include: Labs ordered CBC CMP unremarkable white count normal chemistry normal urinalysis negative. ?  ?  ? ?  ?Test Considered: ?  ?  CT imaging and MRI of the brain considered, however the patient states her dizziness has been worked up with MRI that was negative for stroke.  Symptoms have been chronic and unchanged today. ?  ?Critical Interventions: ?  ?IV fluids and medications IV fluid resuscitation.  \ ?\\ ? ?Patient given Zofran with improvement of symptoms. ? ?Vital signs within normal limits.  Lab work appears unremarkable.  We will continue to advise outpatient follow-up with her doctor in 2 to 3 days.  Advised return if she cannot keep down any fluids or has worsening symptoms. ?  ? ? ? ? ? ? ? ?Final Clinical Impression(s) / ED Diagnoses ?Final diagnoses:  ?Vomiting and diarrhea  ? ? ?Rx / DC Orders ?ED Discharge Orders   ? ?      Ordered  ?  ondansetron (ZOFRAN-ODT) 4 MG disintegrating tablet  Every 8 hours PRN       ? 04/01/21 1156  ?  loperamide (IMODIUM) 2 MG capsule  4 times daily PRN       ? 04/01/21 1156  ? ?  ?  ? ?  ? ? ?  ?Luna Fuse, MD ?04/01/21 1156 ? ?

## 2021-04-01 NOTE — ED Notes (Signed)
Crackers and drink given ?

## 2021-04-01 NOTE — Discharge Instructions (Signed)
Your blood tests were within normal limits. ? ?Take the medications as written.  Continue to follow-up with your doctor in 2 to 3 days.  However return back to the ER if you cannot keep down any fluids if your symptoms worsen if you have pain or if you have any additional concerns. ?

## 2021-04-01 NOTE — ED Notes (Signed)
Dc instructions and scripts reviewed with pt no questions or concerns at this time. Will follow up with pcp in a week.  ?

## 2021-04-01 NOTE — ED Triage Notes (Signed)
Pt arrives to ED with c/o dizziness that is chronic and on-going and diarrhea/vomiting that started last night.   ?

## 2021-04-02 ENCOUNTER — Encounter (HOSPITAL_BASED_OUTPATIENT_CLINIC_OR_DEPARTMENT_OTHER): Payer: Self-pay | Admitting: Emergency Medicine

## 2021-04-02 ENCOUNTER — Other Ambulatory Visit: Payer: Self-pay

## 2021-04-02 ENCOUNTER — Emergency Department (HOSPITAL_BASED_OUTPATIENT_CLINIC_OR_DEPARTMENT_OTHER)
Admission: EM | Admit: 2021-04-02 | Discharge: 2021-04-02 | Disposition: A | Discharge status: Home / Self Care | Payer: Managed Care, Other (non HMO) | Attending: Emergency Medicine | Admitting: Emergency Medicine

## 2021-04-02 DIAGNOSIS — Z79899 Other long term (current) drug therapy: Secondary | ICD-10-CM | POA: Insufficient documentation

## 2021-04-02 DIAGNOSIS — Z7982 Long term (current) use of aspirin: Secondary | ICD-10-CM | POA: Diagnosis not present

## 2021-04-02 DIAGNOSIS — E119 Type 2 diabetes mellitus without complications: Secondary | ICD-10-CM | POA: Insufficient documentation

## 2021-04-02 DIAGNOSIS — I10 Essential (primary) hypertension: Secondary | ICD-10-CM | POA: Insufficient documentation

## 2021-04-02 DIAGNOSIS — R112 Nausea with vomiting, unspecified: Secondary | ICD-10-CM | POA: Diagnosis present

## 2021-04-02 DIAGNOSIS — Z8616 Personal history of COVID-19: Secondary | ICD-10-CM | POA: Insufficient documentation

## 2021-04-02 DIAGNOSIS — U071 COVID-19: Secondary | ICD-10-CM | POA: Diagnosis not present

## 2021-04-02 LAB — CBG MONITORING, ED: Glucose-Capillary: 73 mg/dL (ref 70–99)

## 2021-04-02 LAB — RESP PANEL BY RT-PCR (FLU A&B, COVID) ARPGX2
Influenza A by PCR: NEGATIVE
Influenza B by PCR: NEGATIVE
SARS Coronavirus 2 by RT PCR: POSITIVE — AB

## 2021-04-02 MED ORDER — NIRMATRELVIR/RITONAVIR (PAXLOVID)TABLET: 3.0000 | ORAL_TABLET | Freq: Two times a day (BID) | ORAL | 0 refills | Status: AC

## 2021-04-02 NOTE — ED Triage Notes (Signed)
Pt states she was here yesterday for dizziness. Pt has taken 2 home covid test that are positive. Here for medications.   ?

## 2021-04-02 NOTE — ED Provider Notes (Addendum)
?[facility] ?Provider Note ? ? ?CSN: [id number] ?Arrival date & time: 04/02/21  1459 ? ?  ? ?History ? ?Chief Complaint  ?Patient presents with  ? Covid Positive  ? ? ?Lindsay Rios is a 56 y.o. female. ? ?Patient seen yesterday for evaluation of some dizziness nausea vomiting and diarrhea.  Patient was notified she had a COVID exposure.  Tested at home last night and was positive for COVID.  Also does have some cough and some congestion.  No real feeling of shortness of breath no chest pain.  Patient came in today with wanting oral antiviral for COVID.  Symptoms started 3 days ago.  So she is within the window.  Lab work from yesterday shows that her renal function GFR is greater than 60.  So she would be a candidate to take it.  When patient was seen here yesterday.  It was not known that she had a COVID infection.  Her symptoms were somewhat atypical.  Mostly GI related.  Labs reviewed from yesterday. ? ?Past medical history positive COVID illness here history of hypertension hyperlipidemia type 2 diabetes.  Patient has significant risk factors.  So oral antiviral appropriate ? ? ?  ? ?Home Medications ?Prior to Admission medications   ?Medication Sig Start Date End Date Taking? Authorizing Provider  ?aspirin 81 MG chewable tablet Chew 81 mg by mouth daily.    [provider]  ?cetirizine (ZYRTEC) 10 MG tablet Take 10 mg by mouth daily.    [provider]  ?Cholecalciferol (PA VITAMIN D-3 GUMMY PO) Chew one (1) gummy by mouth daily.    [provider]  ?empagliflozin (JARDIANCE) 25 MG TABS tablet Take 25 mg by mouth daily.    [provider]  ?glycopyrrolate (ROBINUL) 2 MG tablet Take 1 tablet (2 mg total) by mouth 2 (two) times daily. 12/16/19   Ladene Artist, MD  ?liraglutide (VICTOZA) 18 MG/3ML SOPN Inject 0.8 mg into the skin every morning.    [provider]  ?lisdexamfetamine (VYVANSE) 20 MG capsule Take 20 mg by mouth daily.     [provider]  ?lisinopril (ZESTRIL) 10 MG tablet Take 1 tablet (10 mg total) by mouth daily. 12/09/18   Josue Hector, MD  ?loperamide (IMODIUM) 2 MG capsule Take 1 capsule (2 mg total) by mouth 4 (four) times daily as needed for diarrhea or loose stools. 04/01/21   Luna Fuse, MD  ?meclizine (ANTIVERT) 25 MG tablet Take 1 tablet (25 mg total) by mouth 3 (three) times daily as needed for up to 30 doses for dizziness. 03/06/21   Wyvonnia Dusky, MD  ?Multiple Vitamins-Minerals (ALIVE WOMENS GUMMY PO) Chew one (1) gummy by mouth daily.    [provider]  ?nitroGLYCERIN (NITROSTAT) 0.4 MG SL tablet Place 1 tablet (0.4 mg total) under the tongue every 5 (five) minutes as needed for chest pain. ?Patient not taking: Reported on 01/04/2021 12/20/13   Lendon Colonel, NP  ?ondansetron (ZOFRAN-ODT) 4 MG disintegrating tablet Take 1 tablet (4 mg total) by mouth every 8 (eight) hours as needed for up to 4 doses for nausea or vomiting. 04/01/21   Luna Fuse, MD  ?rosuvastatin (CRESTOR) 20 MG tablet Take 20 mg by mouth daily.    [provider]  ?SEMGLEE, YFGN, 100 UNIT/ML Pen Inject into the skin. 11/28/20   [provider]  ?   ? ?Allergies    ?Other, Sulfa antibiotics, Exenatide, and Metformin hcl   ? ?  Review of Systems   ?Review of Systems  ?Constitutional:  Negative for chills and fever.  ?HENT:  Positive for congestion. Negative for ear pain and sore throat.   ?Eyes:  Negative for pain and visual disturbance.  ?Respiratory:  Positive for cough. Negative for shortness of breath.   ?Cardiovascular:  Negative for chest pain and palpitations.  ?Gastrointestinal:  Positive for diarrhea, nausea and vomiting. Negative for abdominal pain.  ?Genitourinary:  Negative for dysuria and hematuria.  ?Musculoskeletal:  Positive for myalgias. Negative for arthralgias and back pain.  ?Skin:  Negative for color change and rash.  ?Neurological:  Negative for seizures and syncope.  ?All other  systems reviewed and are negative. ? ?Physical Exam ?Updated Vital Signs ?BP (!) 149/85 (BP Location: Right Arm)   Pulse 71   Temp 98.4 ?F (36.9 ?C)   Resp 16   Ht 1.6 m ('5\' 3"'$ )   Wt 63.6 kg   SpO2 98%   BMI 24.84 kg/m?  ?Physical Exam ?Vitals and nursing note reviewed.  ?Constitutional:   ?   General: She is not in acute distress. ?   Appearance: Normal appearance. She is well-developed.  ?HENT:  ?   Head: Normocephalic and atraumatic.  ?Eyes:  ?   Extraocular Movements: Extraocular movements intact.  ?   Conjunctiva/sclera: Conjunctivae normal.  ?   Pupils: Pupils are equal, round, and reactive to light.  ?Cardiovascular:  ?   Rate and Rhythm: Normal rate and regular rhythm.  ?   Heart sounds: No murmur heard. ?Pulmonary:  ?   Effort: Pulmonary effort is normal. No respiratory distress.  ?   Breath sounds: Normal breath sounds. No wheezing or rales.  ?Abdominal:  ?   Palpations: Abdomen is soft.  ?   Tenderness: There is no abdominal tenderness.  ?Musculoskeletal:     ?   General: No swelling.  ?   Cervical back: Normal range of motion and neck supple. No rigidity.  ?Skin: ?   General: Skin is warm and dry.  ?   Capillary Refill: Capillary refill takes less than 2 seconds.  ?Neurological:  ?   General: No focal deficit present.  ?   Mental Status: She is alert and oriented to person, place, and time.  ?Psychiatric:     ?   Mood and Affect: Mood normal.  ? ? ?ED Results / Procedures / Treatments   ?Labs ?(all labs ordered are listed, but only abnormal results are displayed) ?Labs Reviewed  ?RESP PANEL BY RT-PCR (FLU A&B, COVID) ARPGX2 - Abnormal; Notable for the following components:  ?    Result Value  ? SARS Coronavirus 2 by RT PCR POSITIVE (*)   ? All other components within normal limits  ?CBG MONITORING, ED  ? ? ?EKG ?None ? ?Radiology ?No results found. ? ?Procedures ?Procedures  ? ? ?Medications Ordered in ED ?Medications - No data to display ? ?ED Course/ Medical Decision Making/ A&P ?  ?                         ?Medical Decision Making ? ?Patient nontoxic no acute distress.  But patient's test here positive for COVID influenza negative.  Labs from yesterday showed that no significant lab abnormalities GFR was greater than 60.  So patient is a good candidate for Paxlovid. ? ?Patient also has significant risk factors.  For COVID infection. ? ? ?Final Clinical Impression(s) / ED Diagnoses ?Final diagnoses:  ?COVID  ? ? ?  Rx / DC Orders ?ED Discharge Orders   ? ? None  ? ?  ? ? ?  ?Fredia Sorrow, MD ?04/02/21 1804 ? ?  ?Fredia Sorrow, MD ?04/02/21 1806 ? ?

## 2021-04-02 NOTE — Discharge Instructions (Addendum)
Take the prescription Paxlovid as ordered.  Best to started tonight.  Your pharmacy may be closed already.  But there is a [facility] on [place] that would be open 24/7.  Your COVID test was positive here as well.  Influenza testing was negative.  Symptomatic treatment as needed.  Return for any new or worse symptoms.  In particular return for any difficulty breathing or significant shortness of breath. ?

## 2021-04-26 DIAGNOSIS — H8112 Benign paroxysmal vertigo, left ear: Secondary | ICD-10-CM | POA: Insufficient documentation

## 2021-04-26 DIAGNOSIS — E1165 Type 2 diabetes mellitus with hyperglycemia: Secondary | ICD-10-CM | POA: Insufficient documentation

## 2021-06-14 DIAGNOSIS — Z9010 Acquired absence of unspecified breast and nipple: Secondary | ICD-10-CM | POA: Insufficient documentation

## 2021-08-25 ENCOUNTER — Ambulatory Visit: Payer: Managed Care, Other (non HMO) | Admitting: Neurology

## 2021-08-25 ENCOUNTER — Encounter: Payer: Self-pay | Admitting: Neurology

## 2021-08-25 VITALS — BP 119/86 | HR 92 | Ht 63.0 in | Wt 154.5 lb

## 2021-08-25 DIAGNOSIS — R269 Unspecified abnormalities of gait and mobility: Secondary | ICD-10-CM | POA: Insufficient documentation

## 2021-08-25 NOTE — Progress Notes (Signed)
Chief Complaint  Patient presents with   New Patient (Initial Visit)    Rm 15. Alone. Lindsay Rios 2017/Paper/[facility] [place]/Imbalance, dizziness, non otologic.      ASSESSMENT AND PLAN  Lindsay Rios is a 56 y.o. 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 Laboratory evaluations in March 2023 CBC showed hemoglobin 14.5, CMP showed normal creatinine 0.56 with elevated glucose 180,  MEDICAL HISTORY:  Lindsay Rios is a 56 year old female, seen in request by her primary care doctor Lindsay Rios, for evaluation of unsteady gait,   I reviewed and summarized the referring note. 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 There is moderately severe right and moderate left foraminal narrowing. This could lead to right C6 nerve root compression. At C6-C7 there is broad disc bulging but there does not appear to be any nerve root impingement.   She was evaluated by neurosurgeon at that time, offered elective cervical decompression surgery, but she decided not to proceed at that time  She had few falls in past 3 months,  PHYSICAL EXAM:   Vitals:   08/25/21 1313  BP: 119/86  Pulse: 92  Weight: 154 lb 8 oz (70.1 kg)  Height: '5\' 3"'$  (1.6 m)   Body mass index is 27.37 kg/m.  PHYSICAL EXAMNIATION:  Gen: NAD, conversant, well nourised, well groomed                     Cardiovascular: Regular rate rhythm, no peripheral edema, warm, nontender. Eyes: Conjunctivae clear without exudates or hemorrhage Neck: Supple, no carotid bruits. Pulmonary: Clear to auscultation bilaterally   NEUROLOGICAL EXAM:  MENTAL STATUS: Speech/cognition: Awake, alert, oriented to history taking and casual conversation CRANIAL NERVES: CN II: Visual fields are full to confrontation. Pupils are round equal and briskly reactive to light. CN III, IV, VI: extraocular movement are normal. No ptosis. CN V: Facial sensation is intact to light touch CN VII: Face is symmetric with normal eye closure  CN VIII: Hearing is normal to causal conversation. CN IX, X: Phonation is normal. CN XI: Head turning and shoulder shrug are intact  MOTOR: Mild left shoulder abduction, external rotation weakness, mild bilateral hip flexion weakness  REFLEXES: Reflexes are 2+ and symmetric at the biceps, triceps, 3/3 knees, and absent at ankles. Plantar responses are  extensor bilaterally  SENSORY: Absent vibratory sensation at toes, length-dependent decreased pinprick to distal shin level  COORDINATION: There is no trunk or limb dysmetria noted.  GAIT/STANCE: She can get up from seated position  arm crossed, wide-based, cautious, stiff gait, mild difficulty turning   REVIEW OF SYSTEMS:  Full 14 system review of systems performed and notable only for as above All other review of systems were negative.   ALLERGIES: Allergies  Allergen Reactions   Other Nausea Only and Other (See Comments)    REACTION: swelling under skin after shots Treseba    Sulfa Antibiotics Nausea Only    REACTION: nausea   Exenatide     Other reaction(s): dizzy, upset stomach   Metformin Hcl     Other reaction(s): GI Upset (intolerance), stomach upset    HOME MEDICATIONS: Current Outpatient Medications  Medication Sig Dispense Refill   aspirin 81 MG chewable tablet Chew 81 mg by mouth daily.     cetirizine (ZYRTEC) 10 MG tablet Take 10 mg by mouth daily.     Cholecalciferol (PA VITAMIN D-3 GUMMY PO) Chew one (1) gummy by mouth daily.     Dulaglutide (TRULICITY) 2.35 TI/1.4ER SOPN Inject 0.75 mg into the skin once a week.     empagliflozin (JARDIANCE) 25 MG TABS tablet Take 25 mg by mouth daily.     escitalopram (LEXAPRO) 10 MG tablet Take 1 tablet by mouth daily.     glycopyrrolate (ROBINUL) 2 MG tablet Take 1 tablet (2 mg total) by mouth 2 (two) times daily. 180 tablet 3   lisdexamfetamine (VYVANSE) 20 MG capsule Take 20 mg by mouth daily.     lisinopril (ZESTRIL) 10 MG tablet Take 1 tablet (10 mg total) by mouth daily. 90 tablet 3   loperamide (IMODIUM) 2 MG capsule Take 1 capsule (2 mg total) by mouth 4 (four) times daily as needed for diarrhea or loose stools. 12 capsule 0   meclizine (ANTIVERT) 25 MG tablet Take 1 tablet (25 mg total) by mouth 3 (three) times daily as needed for up to 30 doses for dizziness. 30 tablet 0   Multiple Vitamins-Minerals (ALIVE WOMENS GUMMY PO) Chew one (1) gummy by mouth daily.     nitrofurantoin, macrocrystal-monohydrate, (MACROBID) 100 MG capsule Take 100 mg by mouth 2 (two) times daily.     nitroGLYCERIN (NITROSTAT) 0.4 MG SL tablet Place 1 tablet (0.4 mg total)  under the tongue every 5 (five) minutes as needed for chest pain. 30 tablet 12   ondansetron (ZOFRAN-ODT) 4 MG disintegrating tablet Take 1 tablet (4 mg total) by mouth every 8 (eight) hours as needed for up to 4 doses for nausea or vomiting. 4 tablet 0   phenazopyridine (PYRIDIUM) 200 MG tablet Take 1 tablet by mouth 3 (three) times daily.     rosuvastatin (CRESTOR) 20 MG tablet Take 20 mg by mouth daily.     SEMGLEE, YFGN, 100 UNIT/ML Pen Inject into the skin.     Current Facility-Administered Medications  Medication Dose Route Frequency Provider Last Rate Last Admin   0.9 %  sodium chloride infusion  500 mL Intravenous Once Ladene Artist, MD        PAST MEDICAL HISTORY: Past Medical History:  Diagnosis Date   ADD (attention deficit disorder)    Allergy    Depression    Ejection fraction    Gestational diabetes 1999; 2002   History of chicken pox    Human papilloma virus    High Risk  Hyperlipidemia    Hypertension    Numbness and tingling    Osteopenia    Pneumonia 2012   Sleep apnea with use of continuous positive airway pressure (CPAP)    Type II diabetes mellitus ([place])    "borderline; I'm on Metformin"    PAST SURGICAL HISTORY: Past Surgical History:  Procedure Laterality Date   ANAL RECTAL MANOMETRY N/A 03/17/2015   Procedure: ANO RECTAL MANOMETRY;  Surgeon: Mauri Pole, MD;  Location: [facility];  Service: Endoscopy;  Laterality: N/A;   BREAST LUMPECTOMY WITH RADIOACTIVE SEED LOCALIZATION Right 06/05/2019   Procedure: [name];  Surgeon: Coralie Keens, MD;  Location: [facility];  Service: General;  Laterality: Right;   BREAST RECONSTRUCTION WITH PLACEMENT OF TISSUE EXPANDER AND FLEX HD (ACELLULAR HYDRATED DERMIS) Bilateral 04/19/2020   Procedure: IMMEDIATE BILATERAL BREAST RECONSTRUCTION WITH PLACEMENT OF TISSUE EXPANDER AND FLEX HD (ACELLULAR HYDRATED DERMIS);  Surgeon: Wallace Going, DO;  Location: [facility];  Service: Plastics;  Laterality: Bilateral;   LEFT HEART CATHETERIZATION WITH CORONARY ANGIOGRAM N/A 12/22/2013   Procedure: LEFT HEART CATHETERIZATION WITH CORONARY ANGIOGRAM;  Surgeon: Jettie Booze, MD;  [facility];  Service: Cardiovascular;  Laterality: N/A;   NIPPLE SPARING MASTECTOMY Bilateral 04/19/2020   Procedure: BILATERAL NIPPLE SPARING MASTECTOMY;  Surgeon: Coralie Keens, MD;  Location: [facility];  Service: General;  Laterality: Bilateral;   REMOVAL OF TISSUE EXPANDER AND PLACEMENT OF IMPLANT Bilateral 08/12/2020   Procedure: REMOVAL OF TISSUE EXPANDER AND PLACEMENT OF IMPLANT;  Surgeon: Wallace Going, DO;  Location: [facility];  Service: Plastics;  Laterality: Bilateral;  90 min   SHOULDER ARTHROSCOPY W/ ROTATOR CUFF REPAIR Right 1990's   VAGINAL HYSTERECTOMY  2013   HPV/Cervical Dysplasia; partial    FAMILY HISTORY: Family History  Problem Relation Age of Onset   Hyperlipidemia Mother    Hyperlipidemia Father    Hypertension Father    Diabetes Father    Heart disease Father    Stroke Father    Cancer Maternal Grandmother        Colon Cancer   Alzheimer's disease Maternal Grandmother    Colon cancer Maternal Grandmother    Heart disease Brother 54       Myocardial Infarction   Breast cancer Paternal Aunt    Esophageal cancer Neg Hx    Stomach cancer Neg Hx    Rectal cancer Neg Hx     SOCIAL HISTORY: Social History   Socioeconomic History   Marital status: Married    Spouse name: [name] (Separated)    Number of children: 2   Years of education: 14   Highest education level: Not on file  Occupational History   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   Occupation: Full- Time Student    Education: [employer]    Tobacco Use: She has never smoked.     Alcohol Use:  Rarely   Drug Use:  None   Diet:  Regular   Exercise:  None   Hobbies: Shopping      Social Determinants of Health   Financial Resource Strain: Not on file  Food Insecurity: Not on file  Transportation Needs: Not on file  Physical Activity: Not on file  Stress: Not on file  Social Connections: Not on file  Intimate Partner Violence: Not on file      Marcial Pacas, M.D. Ph.D.  [facility] [address], [place], [place] [address] Ph: [phone] Fax: [phone]  CC:  Lindsay Rios, [place] [address],  [place] [address]  [name], [place]

## 2021-08-29 ENCOUNTER — Telehealth: Payer: Self-pay | Admitting: Neurology

## 2021-08-29 NOTE — Telephone Encounter (Signed)
[employer] sent to [facility] they obtain auth  

## 2021-08-30 ENCOUNTER — Ambulatory Visit
Admission: RE | Admit: 2021-08-30 | Discharge: 2021-08-30 | Disposition: A | Discharge status: Home / Self Care | Payer: Managed Care, Other (non HMO) | Source: Ambulatory Visit | Attending: Neurology | Admitting: Neurology

## 2021-08-30 ENCOUNTER — Other Ambulatory Visit (HOSPITAL_COMMUNITY): Payer: Self-pay

## 2021-08-30 DIAGNOSIS — R269 Unspecified abnormalities of gait and mobility: Secondary | ICD-10-CM

## 2021-08-30 MED ORDER — HYDROCHLOROTHIAZIDE 12.5 MG PO CAPS
ORAL_CAPSULE | ORAL | 4 refills | Status: DC
  Filled 2021-08-30: qty 90, 90d supply, fill #0

## 2021-08-30 MED ORDER — ETHYNODIOL DIAC-ETH ESTRADIOL 1-35 MG-MCG PO TABS
ORAL_TABLET | ORAL | 4 refills | Status: DC
  Filled 2021-08-30: qty 56, 56d supply, fill #0
  Filled 2021-08-30: qty 28, 28d supply, fill #0

## 2021-08-31 ENCOUNTER — Other Ambulatory Visit (HOSPITAL_COMMUNITY): Payer: Self-pay

## 2021-09-05 ENCOUNTER — Telehealth: Payer: Self-pay | Admitting: Neurology

## 2021-09-05 NOTE — Telephone Encounter (Signed)
I called the pt back and have added to 09/12/2021 at 330 for a virtual visit.

## 2021-09-05 NOTE — Progress Notes (Signed)
MRI shows mild spinal stenosis and severe right sided stenosis at C5-6. She did have mild spinal stenosis at this spot on her MRI in 2016 as well

## 2021-09-05 NOTE — Telephone Encounter (Signed)
Pt is calling and said Dr. Krista Blue told her to schedule a VV right away to discuss results, When looking for a sooner appointment nothing came up until 10/31 the date Pt has appointment schedule. Pt said she need something sooner

## 2021-09-12 ENCOUNTER — Telehealth (INDEPENDENT_AMBULATORY_CARE_PROVIDER_SITE_OTHER): Payer: Managed Care, Other (non HMO) | Admitting: Neurology

## 2021-09-12 ENCOUNTER — Telehealth: Payer: Self-pay | Admitting: Neurology

## 2021-09-12 DIAGNOSIS — M5412 Radiculopathy, cervical region: Secondary | ICD-10-CM | POA: Diagnosis not present

## 2021-09-12 DIAGNOSIS — R269 Unspecified abnormalities of gait and mobility: Secondary | ICD-10-CM

## 2021-09-12 NOTE — Progress Notes (Addendum)
No chief complaint on file.     ASSESSMENT AND PLAN  Lindsay Rios is a 56 y.o. female    Worsening gait abnormality, urinary even bowel incontinence occasionally,  Reviewed MRI of CERVICAL spine on September 01, 2021: Evidence of C5-6 disc bulging, uncovertebral joint hypertrophy with evidence of mild canal stenosis severe right foraminal stenosis, no significant change compared to previous scan in 2016,  But based on imaging findings would not explain her current complaints of worsening gait abnormality, either bowel or bladder incontinence,  Will proceed with EMG nerve conduction study,  Also refer patients to neurosurgeon for evaluation,  Addendum, neurosurgical evaluation by Lindsay Rios October 12, 2021, C5-6, broad-based disc osteophyte complex with right greater than left neuroforaminal stenosis mild spinal stenosis, not a surgical candidate, epidural injection planned  DIAGNOSTIC DATA (LABS, IMAGING, TESTING) - I reviewed patient records, labs, notes, testing and imaging myself where available. Laboratory evaluations in March 2023 CBC showed hemoglobin 14.5, CMP showed normal creatinine 0.56 with elevated glucose 180,  MEDICAL HISTORY:  Lindsay Rios is a 56 year old female, seen in request by her primary care doctor Lindsay Rios, for evaluation of unsteady gait,   I reviewed and summarized the referring note. 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 There is moderately severe right and moderate left foraminal narrowing. This could lead to right C6 nerve root compression. At C6-C7 there is broad disc bulging but there does not appear to be any nerve root impingement.   She was evaluated by neurosurgeon at that time, offered elective cervical decompression surgery, but she decided not to proceed at that time  She had few falls in past 3 months,  Virtual Visit via video UPDATE September 12 2021 I discussed the limitations of evaluation and management by telemedicine and the availability of in person appointments. The patient expressed understanding and agreed to proceed  Location: Provider: [place] office; Patient: Home  I connected with Lindsay Rios  on September 12, 2021 by a video enabled telemedicine application and verified that I am speaking with the correct person using two identifiers.  She continue complains of abnormality, lack of stamina, even occasionally bowel and bladder incontinence, neck pain,  Personally reviewed MRI of cervical spine with patient on August 3 /2023: - At C5-6 disc bulging and uncovertebral joint hypertrophy with borderline mild spinal stenosis and severe right foraminal stenosis.     Observations/Objective: Awake alert oriented to history taking care of conversation, moving 4 extremities without difficulty   REVIEW OF SYSTEMS:  Full 14 system review of systems performed and notable only for as above All other review of systems were negative.   ALLERGIES: Allergies  Allergen Reactions   Other Nausea Only and Other (See Comments)  REACTION: swelling under skin after shots Lindsay Rios    Sulfa  Antibiotics Nausea Only    REACTION: nausea   Exenatide     Other reaction(s): dizzy, upset stomach   Metformin Hcl     Other reaction(s): GI Upset (intolerance), stomach upset    HOME MEDICATIONS: Current Outpatient Medications  Medication Sig Dispense Refill   aspirin 81 MG chewable tablet Chew 81 mg by mouth daily.     cetirizine (ZYRTEC) 10 MG tablet Take 10 mg by mouth daily.     Cholecalciferol (PA VITAMIN D-3 GUMMY PO) Chew one (1) gummy by mouth daily.     Dulaglutide (TRULICITY) 2.99 ME/2.6ST SOPN Inject 0.75 mg into the skin once a week.     empagliflozin (JARDIANCE) 25 MG TABS tablet Take 25 mg by mouth daily.     escitalopram (LEXAPRO) 10 MG tablet Take 1 tablet by mouth daily.     ethynodiol-ethinyl estradiol (ZOVIA 1/35, 28,) 1-35 MG-MCG tablet Take 1 tablet by mouth daily 84 tablet 4   glycopyrrolate (ROBINUL) 2 MG tablet Take 1 tablet (2 mg total) by mouth 2 (two) times daily. 180 tablet 3   hydrochlorothiazide (MICROZIDE) 12.5 MG capsule Take 1 capsule by mouth every morning 90 capsule 4   lisdexamfetamine (VYVANSE) 20 MG capsule Take 20 mg by mouth daily.     lisinopril (ZESTRIL) 10 MG tablet Take 1 tablet (10 mg total) by mouth daily. 90 tablet 3   loperamide (IMODIUM) 2 MG capsule Take 1 capsule (2 mg total) by mouth 4 (four) times daily as needed for diarrhea or loose stools. 12 capsule 0   meclizine (ANTIVERT) 25 MG tablet Take 1 tablet (25 mg total) by mouth 3 (three) times daily as needed for up to 30 doses for dizziness. 30 tablet 0   Multiple Vitamins-Minerals (ALIVE WOMENS GUMMY PO) Chew one (1) gummy by mouth daily.     nitrofurantoin, macrocrystal-monohydrate, (MACROBID) 100 MG capsule Take 100 mg by mouth 2 (two) times daily.     nitroGLYCERIN (NITROSTAT) 0.4 MG SL tablet Place 1 tablet (0.4 mg total) under the tongue every 5 (five) minutes as needed for chest pain. 30 tablet 12   ondansetron (ZOFRAN-ODT) 4 MG disintegrating tablet Take 1 tablet (4 mg total)  by mouth every 8 (eight) hours as needed for up to 4 doses for nausea or vomiting. 4 tablet 0   phenazopyridine (PYRIDIUM) 200 MG tablet Take 1 tablet by mouth 3 (three) times daily.     rosuvastatin (CRESTOR) 20 MG tablet Take 20 mg by mouth daily.     SEMGLEE, YFGN, 100 UNIT/ML Pen Inject into the skin.     Current Facility-Administered Medications  Medication Dose Route Frequency Provider Last Rate Last Admin   0.9 %  sodium chloride infusion  500 mL Intravenous Once Ladene Artist, MD        PAST MEDICAL HISTORY: Past Medical History:  Diagnosis Date   ADD (attention deficit disorder)    Allergy    Depression    Ejection fraction    Gestational diabetes 1999; 2002   History of chicken pox    Human papilloma virus    High Risk    Hyperlipidemia    Hypertension    Numbness and tingling    Osteopenia    Pneumonia 2012   Sleep apnea with use of continuous positive airway pressure (CPAP)    Type II diabetes mellitus ([place])    "borderline; I'm on Metformin"    PAST SURGICAL  HISTORY: Past Surgical History:  Procedure Laterality Date   ANAL RECTAL MANOMETRY N/A 03/17/2015   Procedure: ANO RECTAL MANOMETRY;  Surgeon: Mauri Pole, MD;  Location: [facility];  Service: Endoscopy;  Laterality: N/A;   BREAST LUMPECTOMY WITH RADIOACTIVE SEED LOCALIZATION Right 06/05/2019   Procedure: [name];  Surgeon: Coralie Keens, MD;  Location: [place];  Service: General;  Laterality: Right;   BREAST RECONSTRUCTION WITH PLACEMENT OF TISSUE EXPANDER AND FLEX HD (ACELLULAR HYDRATED DERMIS) Bilateral 04/19/2020   Procedure: IMMEDIATE BILATERAL BREAST RECONSTRUCTION WITH PLACEMENT OF TISSUE EXPANDER AND FLEX HD (ACELLULAR HYDRATED DERMIS);  Surgeon: Wallace Going, DO;  Location: [place];  Service: Plastics;  Laterality: Bilateral;   LEFT HEART CATHETERIZATION WITH CORONARY ANGIOGRAM N/A 12/22/2013    Procedure: LEFT HEART CATHETERIZATION WITH CORONARY ANGIOGRAM;  Surgeon: Jettie Booze, MD;  Location: [facility] CATH LAB;  Service: Cardiovascular;  Laterality: N/A;   NIPPLE SPARING MASTECTOMY Bilateral 04/19/2020   Procedure: BILATERAL NIPPLE SPARING MASTECTOMY;  Surgeon: Coralie Keens, MD;  Location: [place];  Service: General;  Laterality: Bilateral;   REMOVAL OF TISSUE EXPANDER AND PLACEMENT OF IMPLANT Bilateral 08/12/2020   Procedure: REMOVAL OF TISSUE EXPANDER AND PLACEMENT OF IMPLANT;  Surgeon: Wallace Going, DO;  Location: [place];  Service: Plastics;  Laterality: Bilateral;  90 min   SHOULDER ARTHROSCOPY W/ ROTATOR CUFF REPAIR Right 1990's   VAGINAL HYSTERECTOMY  2013   HPV/Cervical Dysplasia; partial    FAMILY HISTORY: Family History  Problem Relation Age of Onset   Hyperlipidemia Mother    Hyperlipidemia Father    Hypertension Father    Diabetes Father    Heart disease Father    Stroke Father    Cancer Maternal Grandmother        Colon Cancer   Alzheimer's disease Maternal Grandmother    Colon cancer Maternal Grandmother    Heart disease Brother 69       Myocardial Infarction   Breast cancer Paternal Aunt    Esophageal cancer Neg Hx    Stomach cancer Neg Hx    Rectal cancer Neg Hx     SOCIAL HISTORY: Social History   Socioeconomic History   Marital status: Married    Spouse name: [name] (Separated)    Number of children: 2   Years of education: 14   Highest education level: Not on file  Occupational History   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   Occupation: Full- Time Student    Education: [employer])    Tobacco Use: She has never smoked.     Alcohol Use:  Rarely   Drug Use:  None   Diet:  Regular   Exercise:  None   Hobbies:  Shopping      Social Determinants of Health   Financial Resource Strain: Not on file  Food Insecurity: Not on file  Transportation Needs: Not on file  Physical Activity: Not on file  Stress: Not on file  Social Connections: Not on file  Intimate Partner Violence: Not on file      Marcial Pacas, M.D. Ph.D.  [facility] [address] [place], [place] [address] Ph: [phone] Fax: [phone]  CC:  [name], [place]. [address] [employer] [address] [place],  [place] [address]  [name], [place]

## 2021-09-12 NOTE — Telephone Encounter (Signed)
Please cancel her follow up visit, changed from EMG nerve conduction study at my next available

## 2021-09-12 NOTE — Telephone Encounter (Signed)
Cancelled follow up visit. Rescheduled for 11/02/21 for NCV/EMG at 8:30 (arrival time 8am). I called patient and left detailed voicemail with the appt information (ok per DPR). Provided our number to call back with any questions or to reschedule, if needed.

## 2021-09-13 ENCOUNTER — Telehealth: Payer: Self-pay | Admitting: Neurology

## 2021-09-13 NOTE — Telephone Encounter (Signed)
Sent to [facility] ph # [phone]

## 2021-10-10 ENCOUNTER — Ambulatory Visit: Payer: Managed Care, Other (non HMO) | Admitting: Neurology

## 2021-11-02 ENCOUNTER — Encounter: Payer: Self-pay | Admitting: Neurology

## 2021-11-02 ENCOUNTER — Ambulatory Visit: Payer: Managed Care, Other (non HMO) | Admitting: Neurology

## 2021-11-02 ENCOUNTER — Telehealth: Payer: Self-pay | Admitting: Neurology

## 2021-11-02 ENCOUNTER — Ambulatory Visit (INDEPENDENT_AMBULATORY_CARE_PROVIDER_SITE_OTHER): Payer: Managed Care, Other (non HMO) | Admitting: Neurology

## 2021-11-02 VITALS — BP 120/82 | HR 82 | Ht 62.0 in | Wt 153.0 lb

## 2021-11-02 DIAGNOSIS — R2689 Other abnormalities of gait and mobility: Secondary | ICD-10-CM | POA: Diagnosis not present

## 2021-11-02 DIAGNOSIS — M5412 Radiculopathy, cervical region: Secondary | ICD-10-CM | POA: Diagnosis not present

## 2021-11-02 DIAGNOSIS — R159 Full incontinence of feces: Secondary | ICD-10-CM

## 2021-11-02 DIAGNOSIS — R269 Unspecified abnormalities of gait and mobility: Secondary | ICD-10-CM

## 2021-11-02 NOTE — Telephone Encounter (Signed)
[employer] sent to [facility] they obtain auth  

## 2021-11-02 NOTE — Progress Notes (Signed)
ASSESSMENT AND PLAN  Lindsay Rios is a 56 y.o. female    Worsening gait abnormality, urinary even bowel incontinence occasionally,  Reviewed MRI of CERVICAL spine on September 01, 2021: Evidence of C5-6 disc bulging, uncovertebral joint hypertrophy with evidence of mild canal stenosis severe right foraminal stenosis, no significant change compared to previous scan in 2016,  Above findings would not explain her progressive worsening upper and lower extremity paresthesia, gait abnormality, even incontinence,  EMG nerve conduction study today showed no large fiber peripheral neuropathy of focal neuropathy, evidence of chronic right C8 7 8 radiculopathy, bilateral L4 radiculopathy, no acute process  Patient desires complete evaluation, MRI of thoracic and lumbar spine  Laboratory evaluation including B12, copper,   DIAGNOSTIC DATA (LABS, IMAGING, TESTING) - I reviewed patient records, labs, notes, testing and imaging myself where available. Laboratory evaluations in March 2023 CBC showed hemoglobin 14.5, CMP showed normal creatinine 0.56 with elevated glucose 180,  MEDICAL HISTORY:  Lindsay Rios is a 56 year old female, seen in request by her primary care doctor Izora Gala, for evaluation of unsteady gait,   I reviewed and summarized the referring note. PMHX DM- Depression anxiety HLD  She reported gradual onset gait abnormality since 2020, unsteady sensation, also developed urinary urgency, frequency, rapid worsening since 2023, she fell few times, hyperextended her neck, now she has occasional urinary incontinence, even bowel incontinence, worsening gait abnormality, intermittent neck pain, cracking sound when turning her neck,  She is the main caregiver of her father and the mother, mother passed away in 03-14-21, she look after her father on a daily basis, who suffered stroke with recent diagnosis of esophagus cancer  I saw patient previously in 2017 for neck  pain, radiating pain to left upper extremity  Personally reviewed MRI of cervical spine in December 2016: At C5-C6 there is spinal stenosis due to right greater than left uncovertebral spurring and right paramedian disc protrusion. There is moderately severe right and moderate left foraminal narrowing. This could lead to right C6 nerve root compression. At C6-C7 there is broad disc bulging but there does not appear to be any nerve root impingement.   She was evaluated by neurosurgeon at that time, offered elective cervical decompression surgery, but she decided not to proceed at that time  She had few falls in past 3 months,  Virtual Visit via video UPDATE September 12 2021 She continue complains of abnormality, lack of stamina, even occasionally bowel and bladder incontinence, neck pain,  Personally reviewed MRI of cervical spine with patient on August 3 /2023: - At C5-6 disc bulging and uncovertebral joint hypertrophy with borderline mild spinal stenosis and severe right foraminal stenosis.   Update November 02, 2021: Patient return for electrodiagnostic study today, continue has constellation of complaints, generalized fatigue, lack of stamina, occasionally bowel and bladder incontinence  Bilateral upper extremity and intermittent lower extremity paresthesia  Reviewed neurosurgery evaluation by Dr Kathyrn Sheriff October 12, 2021, C5-6, broad-based disc osteophyte complex with right greater than left neuroforaminal stenosis mild spinal stenosis, not a surgical candidate, epidural injection planned   PHYSICAL EXAMNIATION: Blood pressure 120/82, pulse 82  Gen: NAD, conversant, well nourised, well groomed                     Cardiovascular: Regular rate rhythm, no peripheral edema, warm, nontender. Eyes: Conjunctivae clear without exudates or hemorrhage Neck: Supple, no carotid bruits.  NEUROLOGICAL EXAM:  MENTAL STATUS: Speech/cognition: Awake, alert oriented to history taking  and casual  conversation  CRANIAL NERVES: CN II: Visual fields are full to confrontation.  Pupils are round equal and briskly reactive to light. CN III, IV, VI: extraocular movement are normal. No ptosis. CN V: Facial sensation is intact to pinprick in all 3 divisions bilaterally. Corneal responses are intact.  CN VII: Face is symmetric with normal eye closure and smile. CN VIII: Hearing is normal to casual conversation CN IX, X: Palate elevates symmetrically. Phonation is normal. CN XI: Head turning and shoulder shrug are intact CN XII: Tongue is midline with normal movements and no atrophy.  MOTOR: Proximal motor examination is limited by her complaints of neck and shoulder pain, felt there was no significant bilateral upper and lower extremity proximal and distal muscle weakness  REFLEXES: Reflexes are 2+ and symmetric at the biceps, triceps, knees, and ankles. Plantar responses are flexor.  SENSORY: Mild length-dependent decreased light touch, pinprick, vibratory sensation,  COORDINATION: Rapid alternating movements and fine finger movements are intact. There is no dysmetria on finger-to-nose and heel-knee-shin.    GAIT/STANCE: Push-up to get up from seated position, cautious, stiff   REVIEW OF SYSTEMS:  Full 14 system review of systems performed and notable only for as above All other review of systems were negative.   ALLERGIES: Allergies  Allergen Reactions   Other Nausea Only and Other (See Comments)    REACTION: swelling under skin after shots [place]    Sulfa Antibiotics Nausea Only    REACTION: nausea   Exenatide     Other reaction(s): dizzy, upset stomach   Metformin Hcl     Other reaction(s): GI Upset (intolerance), stomach upset    HOME MEDICATIONS: Current Outpatient Medications  Medication Sig Dispense Refill   aspirin 81 MG chewable tablet Chew 81 mg by mouth daily.     cetirizine (ZYRTEC) 10 MG tablet Take 10 mg by mouth daily.     Cholecalciferol (PA VITAMIN  D-3 GUMMY PO) Chew one (1) gummy by mouth daily.     Dulaglutide (TRULICITY) 9.98 PJ/8.2NK SOPN Inject 0.75 mg into the skin once a week.     empagliflozin (JARDIANCE) 25 MG TABS tablet Take 25 mg by mouth daily.     escitalopram (LEXAPRO) 10 MG tablet Take 1 tablet by mouth daily.     ethynodiol-ethinyl estradiol (ZOVIA 1/35, 28,) 1-35 MG-MCG tablet Take 1 tablet by mouth daily 84 tablet 4   glycopyrrolate (ROBINUL) 2 MG tablet Take 1 tablet (2 mg total) by mouth 2 (two) times daily. 180 tablet 3   hydrochlorothiazide (MICROZIDE) 12.5 MG capsule Take 1 capsule by mouth every morning 90 capsule 4   lisdexamfetamine (VYVANSE) 20 MG capsule Take 20 mg by mouth daily.     lisinopril (ZESTRIL) 10 MG tablet Take 1 tablet (10 mg total) by mouth daily. 90 tablet 3   loperamide (IMODIUM) 2 MG capsule Take 1 capsule (2 mg total) by mouth 4 (four) times daily as needed for diarrhea or loose stools. 12 capsule 0   meclizine (ANTIVERT) 25 MG tablet Take 1 tablet (25 mg total) by mouth 3 (three) times daily as needed for up to 30 doses for dizziness. 30 tablet 0   Multiple Vitamins-Minerals (ALIVE WOMENS GUMMY PO) Chew one (1) gummy by mouth daily.     nitrofurantoin, macrocrystal-monohydrate, (MACROBID) 100 MG capsule Take 100 mg by mouth 2 (two) times daily.     nitroGLYCERIN (NITROSTAT) 0.4 MG SL tablet Place 1 tablet (0.4 mg total) under the tongue every 5 (five)  minutes as needed for chest pain. 30 tablet 12   ondansetron (ZOFRAN-ODT) 4 MG disintegrating tablet Take 1 tablet (4 mg total) by mouth every 8 (eight) hours as needed for up to 4 doses for nausea or vomiting. 4 tablet 0   phenazopyridine (PYRIDIUM) 200 MG tablet Take 1 tablet by mouth 3 (three) times daily.     rosuvastatin (CRESTOR) 20 MG tablet Take 20 mg by mouth daily.     SEMGLEE, YFGN, 100 UNIT/ML Pen Inject into the skin.     Current Facility-Administered Medications  Medication Dose Route Frequency Provider Last Rate Last Admin   0.9 %   sodium chloride infusion  500 mL Intravenous Once Ladene Artist, MD        PAST MEDICAL HISTORY: Past Medical History:  Diagnosis Date   ADD (attention deficit disorder)    Allergy    Depression    Ejection fraction    Gestational diabetes 1999; 2002   History of chicken pox    Human papilloma virus    High Risk    Hyperlipidemia    Hypertension    Numbness and tingling    Osteopenia    Pneumonia 2012   Sleep apnea with use of continuous positive airway pressure (CPAP)    Type II diabetes mellitus ([place])    "borderline; I'm on Metformin"    PAST SURGICAL HISTORY: Past Surgical History:  Procedure Laterality Date   ANAL RECTAL MANOMETRY N/A 03/17/2015   Procedure: ANO RECTAL MANOMETRY;  Surgeon: Mauri Pole, MD;  [facility]: [facility];  Service: Endoscopy;  Laterality: N/A;   BREAST LUMPECTOMY WITH RADIOACTIVE SEED LOCALIZATION Right 06/05/2019   Procedure: [name];  Surgeon: Coralie Keens, MD;  Location: [place];  Service: General;  Laterality: Right;   BREAST RECONSTRUCTION WITH PLACEMENT OF TISSUE EXPANDER AND FLEX HD (ACELLULAR HYDRATED DERMIS) Bilateral 04/19/2020   Procedure: IMMEDIATE BILATERAL BREAST RECONSTRUCTION WITH PLACEMENT OF TISSUE EXPANDER AND FLEX HD (ACELLULAR HYDRATED DERMIS);  Surgeon: Wallace Going, DO;  Location: [place];  Service: Plastics;  Laterality: Bilateral;   LEFT HEART CATHETERIZATION WITH CORONARY ANGIOGRAM N/A 12/22/2013   Procedure: LEFT HEART CATHETERIZATION WITH CORONARY ANGIOGRAM;  Surgeon: Jettie Booze, MD;  [facility] C[facility];  Service: Cardiovascular;  Laterality: N/A;   NIPPLE SPARING MASTECTOMY Bilateral 04/19/2020   Procedure: BILATERAL NIPPLE SPARING MASTECTOMY;  Surgeon: Coralie Keens, MD;  Location: [place];  Service: General;  Laterality: Bilateral;   REMOVAL OF TISSUE EXPANDER AND PLACEMENT OF  IMPLANT Bilateral 08/12/2020   Procedure: REMOVAL OF TISSUE EXPANDER AND PLACEMENT OF IMPLANT;  Surgeon: Wallace Going, DO;  Location: [place];  Service: Plastics;  Laterality: Bilateral;  90 min   SHOULDER ARTHROSCOPY W/ ROTATOR CUFF REPAIR Right 1990's   VAGINAL HYSTERECTOMY  2013   HPV/Cervical Dysplasia; partial    FAMILY HISTORY: Family History  Problem Relation Age of Onset   Hyperlipidemia Mother    Hyperlipidemia Father    Hypertension Father    Diabetes Father    Heart disease Father    Stroke Father    Cancer Maternal Grandmother        Colon Cancer   Alzheimer's disease Maternal Grandmother    Colon cancer Maternal Grandmother    Heart disease Brother 73       Myocardial Infarction   Breast cancer Paternal Aunt    Esophageal cancer Neg Hx  Stomach cancer Neg Hx    Rectal cancer Neg Hx     SOCIAL HISTORY: Social History   Socioeconomic History   Marital status: Married    Spouse name: [name] (Separated)    Number of children: 2   Years of education: 14   Highest education level: Not on file  Occupational History   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   Occupation: Full- Time Student    Education: [employer] ([employer])    Tobacco Use: She has never smoked.     Alcohol Use:  Rarely   Drug Use:  None   Diet:  Regular   Exercise:  None   Hobbies: Shopping      Social  Determinants of Health   Financial Resource Strain: Not on file  Food Insecurity: Not on file  Transportation Needs: Not on file  Physical Activity: Not on file  Stress: Not on file  Social Connections: Not on file  Intimate Partner Violence: Not on file      Marcial Pacas, M.D. Ph.D.  [facility] [address], [place], [place] [address] Ph: [phone] Fax: [phone]  CC:  [name], [place]. [address] [employer] [address] [place],  [place] [address]  [name], [place]

## 2021-11-04 NOTE — Procedures (Signed)
Full Name: Lindsay Rios Gender: Female MRN #: [id number] Date of Birth: 1965-12-10    Visit Date: 11/02/2021 08:50 Age: 56 Years Examining Physician: Marcial Pacas Referring Physician: Marcial Pacas Height: 5 feet 2 inch History: 56 year old female, with known history of cervical degenerative changes, complains of worsening gait abnormality, bowel and bladder incontinence, worsening bilateral upper and lower extremity paresthesia   Summary of the test,  Nerve conduction study: Right sural, superficial peroneal sensory, median and ulnar sensory responses were normal. Right tibial, peroneal to EDB, ulnar, median motor responses were normal  Electromyography:  Selected needle examination was performed at bilateral lower extremity muscles, lumbosacral paraspinal muscles, right cervical paraspinal muscles, right upper extremity muscles.  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  Conclusion: This is a mild abnormal study.  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  There is also evidence of mild bilateral L4 radiculopathy.  There is no evidence of large fiber peripheral neuropathy or focal neuropathy.  ------------------------------- Marcial Pacas, M.D. Ph.D.  [facility] [address] [place], [place] [address] Tel: [phone] Fax: [phone]  Verbal informed consent was obtained from the patient, patient was informed of potential risk of procedure, including bruising, bleeding, hematoma formation, infection, muscle weakness, muscle pain, numbness, among others.        [place]    Nerve / Sites Muscle Latency Ref. Amplitude Ref. Rel Amp Segments Distance Velocity Ref. Area    ms ms mV mV %  cm m/s m/s mVms  R Median - APB     Wrist  APB 4.1 ?4.4 4.3 ?4.0 100 Wrist - APB 7   19.3     Upper arm APB 8.1  4.5  105 Upper arm - Wrist 20 50 ?49 20.9  R Ulnar - ADM     Wrist ADM 3.0 ?3.3 8.0 ?6.0 100 Wrist - ADM 8   30.1     B.Elbow ADM 4.9  7.0  87.7 B.Elbow - Wrist 10 52 ?49 33.2     A.Elbow ADM 7.7  6.7  95.8 A.Elbow - B.Elbow 14 50 ?49 28.3  R Peroneal - EDB     Ankle EDB 5.4 ?6.5 4.3 ?2.0 100 Ankle - EDB 9   18.4     Fib head EDB 12.2  4.0  93.4 Fib head - Ankle 28 41 ?44 19.4     Pop fossa EDB 14.7  3.8  94.4 Pop fossa - Fib head 10 41 ?44 20.1         Pop fossa - Ankle      R Tibial - AH     Ankle AH 5.6 ?5.8 8.9 ?4.0 100 Ankle - AH 9   22.2     Pop fossa AH 16.5  5.9  66.4 Pop fossa - Ankle 45 41 ?41 21.2             SNC    Nerve / Sites Rec. Site Peak Lat Ref.  Amp Ref. Segments Distance    ms ms V V  cm  R Sural - Ankle (Calf) (1)     Calf Ankle 4.2 ?4.4 12 ?6 Calf - Ankle 14  R Superficial peroneal - Ankle     Lat leg Ankle 4.0 ?4.4 8 ?6 Lat leg -  Ankle 14  R Median - Orthodromic (Dig II, Mid palm)     Dig II Wrist 3.2 ?3.4 10 ?10 Dig II - Wrist 13  R Ulnar - Orthodromic, (Dig V, Mid palm)     Dig V Wrist 2.7 ?3.1 7 ?5 Dig V - Wrist 61             F  Wave    Nerve F Lat Ref.   ms ms  R Ulnar - ADM 30.3 ?32.0  R Tibial - AH 58.0 ?56.0         EMG Summary Table    Spontaneous MUAP Recruitment  Muscle IA Fib PSW Fasc Other Amp Dur. Poly Pattern  R. Tibialis anterior Normal None None None _______ Normal Normal Normal Reduced  R. Tibialis posterior Normal None None None _______ Normal Normal Normal Normal  R. Peroneus longus Normal None None None _______ Normal Normal Normal Normal  R. Gastrocnemius (Medial head) Normal None None None _______ Normal Normal Normal Normal  R. Vastus lateralis Normal None None None _______ Normal Normal Normal Normal  L. Tibialis anterior Normal None None None _______ Normal Normal Normal Reduced  L. Tibialis posterior Normal None None None _______ Normal Normal Normal  Normal  L. Peroneus longus Normal None None None _______ Normal Normal Normal Normal  L. Gastrocnemius (Medial head) Normal None None None _______ Normal Normal Normal Normal  L. Vastus lateralis Normal None None None _______ Normal Normal Normal Normal  L. Lumbar paraspinals (mid) Normal None None None _______ Normal Normal Normal Normal  L. Lumbar paraspinals (low) Normal None None None _______ Normal Normal Normal Normal  R. Lumbar paraspinals (low) Normal None None None _______ Normal Normal Normal Normal  R. Lumbar paraspinals (mid) Normal None None None _______ Normal Normal Normal Normal  R. First dorsal interosseous Normal None None None _______ Normal Normal Normal Reduced  R. Pronator teres Normal None None None _______ Normal Normal Normal Normal  R. Biceps brachii Normal None None None _______ Normal Normal Normal Normal  R. Deltoid Normal None None None _______ Normal Normal Normal Normal  R. Triceps brachii Normal None None None _______ Normal Normal Normal Reduced  R. Cervical paraspinals Normal None None None _______ Normal Normal Normal Normal

## 2021-11-10 LAB — VITAMIN D 25 HYDROXY (VIT D DEFICIENCY, FRACTURES): Vit D, 25-Hydroxy: 29.8 ng/mL — ABNORMAL LOW (ref 30.0–100.0)

## 2021-11-10 LAB — FOLATE: Folate: 8.5 ng/mL (ref >3.0)

## 2021-11-10 LAB — SEDIMENTATION RATE: Sed Rate: 7 mm/hr (ref 0–40)

## 2021-11-10 LAB — ANA W/REFLEX IF POSITIVE: Anti Nuclear Antibody (ANA): NEGATIVE

## 2021-11-10 LAB — VITAMIN B12: Vitamin B-12: 406 pg/mL (ref 232–1245)

## 2021-11-10 LAB — HGB A1C W/O EAG: Hgb A1c MFr Bld: 7.7 % — ABNORMAL HIGH (ref 4.8–5.6)

## 2021-11-10 LAB — C-REACTIVE PROTEIN: CRP: 2 mg/L (ref 0–10)

## 2021-11-10 LAB — RPR: RPR Ser Ql: NONREACTIVE

## 2021-11-10 LAB — CK: Total CK: 38 U/L (ref 32–182)

## 2021-11-10 LAB — HTLV I+II ANTIBODIES, (EIA), BLD: HTLV I/II Ab: NEGATIVE

## 2021-11-10 LAB — LYME DISEASE SEROLOGY W/REFLEX: Lyme Total Antibody EIA: NEGATIVE

## 2021-11-10 LAB — TSH: TSH: 0.754 u[IU]/mL (ref 0.450–4.500)

## 2021-11-10 LAB — HIV ANTIBODY (ROUTINE TESTING W REFLEX): HIV Screen 4th Generation wRfx: NONREACTIVE

## 2021-11-10 LAB — COPPER, SERUM: Copper: 106 ug/dL (ref 80–158)

## 2021-11-13 NOTE — Progress Notes (Signed)
EMG is under procedure tab 

## 2021-11-26 ENCOUNTER — Other Ambulatory Visit: Payer: Managed Care, Other (non HMO)

## 2021-11-29 ENCOUNTER — Ambulatory Visit: Payer: Managed Care, Other (non HMO) | Admitting: Neurology

## 2021-12-23 ENCOUNTER — Other Ambulatory Visit (HOSPITAL_COMMUNITY): Payer: Self-pay

## 2022-01-16 ENCOUNTER — Telehealth: Payer: Self-pay | Admitting: Neurology

## 2022-01-31 ENCOUNTER — Emergency Department (HOSPITAL_BASED_OUTPATIENT_CLINIC_OR_DEPARTMENT_OTHER): Payer: Managed Care, Other (non HMO) | Admitting: Radiology

## 2022-01-31 ENCOUNTER — Emergency Department (HOSPITAL_BASED_OUTPATIENT_CLINIC_OR_DEPARTMENT_OTHER)
Admission: EM | Admit: 2022-01-31 | Discharge: 2022-01-31 | Disposition: A | Discharge status: Home / Self Care | Payer: Managed Care, Other (non HMO) | Attending: Emergency Medicine | Admitting: Emergency Medicine

## 2022-01-31 ENCOUNTER — Encounter (HOSPITAL_BASED_OUTPATIENT_CLINIC_OR_DEPARTMENT_OTHER): Payer: Self-pay | Admitting: Emergency Medicine

## 2022-01-31 ENCOUNTER — Other Ambulatory Visit: Payer: Self-pay

## 2022-01-31 ENCOUNTER — Other Ambulatory Visit: Payer: Managed Care, Other (non HMO)

## 2022-01-31 ENCOUNTER — Inpatient Hospital Stay: Admission: RE | Admit: 2022-01-31 | Payer: Managed Care, Other (non HMO) | Source: Ambulatory Visit

## 2022-01-31 ENCOUNTER — Emergency Department (HOSPITAL_BASED_OUTPATIENT_CLINIC_OR_DEPARTMENT_OTHER): Payer: Managed Care, Other (non HMO)

## 2022-01-31 DIAGNOSIS — M25531 Pain in right wrist: Secondary | ICD-10-CM | POA: Insufficient documentation

## 2022-01-31 DIAGNOSIS — M542 Cervicalgia: Secondary | ICD-10-CM | POA: Insufficient documentation

## 2022-01-31 DIAGNOSIS — G4700 Insomnia, unspecified: Secondary | ICD-10-CM | POA: Diagnosis not present

## 2022-01-31 DIAGNOSIS — W01198A Fall on same level from slipping, tripping and stumbling with subsequent striking against other object, initial encounter: Secondary | ICD-10-CM | POA: Insufficient documentation

## 2022-01-31 DIAGNOSIS — Z7982 Long term (current) use of aspirin: Secondary | ICD-10-CM | POA: Diagnosis not present

## 2022-01-31 DIAGNOSIS — W19XXXA Unspecified fall, initial encounter: Secondary | ICD-10-CM

## 2022-01-31 DIAGNOSIS — R42 Dizziness and giddiness: Secondary | ICD-10-CM | POA: Insufficient documentation

## 2022-01-31 DIAGNOSIS — M79674 Pain in right toe(s): Secondary | ICD-10-CM | POA: Insufficient documentation

## 2022-01-31 LAB — BASIC METABOLIC PANEL
Anion gap: 12 (ref 5–15)
BUN: 20 mg/dL (ref 6–20)
CO2: 27 mmol/L (ref 22–32)
Calcium: 10.4 mg/dL — ABNORMAL HIGH (ref 8.9–10.3)
Chloride: 101 mmol/L (ref 98–111)
Creatinine, Ser: 0.62 mg/dL (ref 0.44–1.00)
GFR, Estimated: >60 mL/min (ref >60)
Glucose, Bld: 132 mg/dL — ABNORMAL HIGH (ref 70–99)
Potassium: 4 mmol/L (ref 3.5–5.1)
Sodium: 140 mmol/L (ref 135–145)

## 2022-01-31 LAB — CBC
HCT: 48.4 % — ABNORMAL HIGH (ref 36.0–46.0)
Hemoglobin: 16.8 g/dL — ABNORMAL HIGH (ref 12.0–15.0)
MCH: 28.9 pg (ref 26.0–34.0)
MCHC: 34.7 g/dL (ref 30.0–36.0)
MCV: 83.2 fL (ref 80.0–100.0)
Platelets: 229 10*3/uL (ref 150–400)
RBC: 5.82 MIL/uL — ABNORMAL HIGH (ref 3.87–5.11)
RDW: 12.6 % (ref 11.5–15.5)
WBC: 8.9 10*3/uL (ref 4.0–10.5)
nRBC: 0 % (ref 0.0–0.2)

## 2022-01-31 LAB — URINALYSIS, ROUTINE W REFLEX MICROSCOPIC
Bacteria, UA: NONE SEEN
Bilirubin Urine: NEGATIVE
Glucose, UA: >1000 mg/dL — AB
Hgb urine dipstick: NEGATIVE
Ketones, ur: 15 mg/dL — AB
Leukocytes,Ua: NEGATIVE
Nitrite: NEGATIVE
Specific Gravity, Urine: >1.046 — ABNORMAL HIGH (ref 1.005–1.030)
pH: 5.5 (ref 5.0–8.0)

## 2022-01-31 NOTE — Discharge Instructions (Signed)
Please take 600 mg ibuprofen every 6 hours for pain as needed.  Would like for you to follow-up with your primary care doctor and neurologist for further evaluation.  You may return to the emergency room at any time for worsening symptoms.

## 2022-01-31 NOTE — ED Triage Notes (Addendum)
Pt arrives pov, slow gait, c/o dizziness x "couple years". Pt endorses fall last night. Denies thinners, -loc.  Pt reports several visits to neurology with no definitive dx. Reports standing and closing eyes causes "head spinning". States "my gait has gotten worse". Pt also c/o hitting head posterior,, c/o neck pain, neg for tenderness. Also reports RT pinky toe pain. Speech clear, AO x 4. Pt reports after triage that RT wrist is hurting.

## 2022-01-31 NOTE — ED Provider Notes (Signed)
[facility] EMERGENCY DEPT Provider Note   CSN: [id number] Arrival date & time: 01/31/22  1412     History Chief Complaint  Patient presents with   Lytle Michaels    Lindsay Rios is a 57 y.o. female patient with history of chronic vertigo over the last year who presents to the emergency department today for further evaluation of right wrist pain, neck pain, and right toe pain from a fall that occurred last night.  Patient states that she suffers from intermittent insomnia secondary to the loss of her parents earlier last year.  She states that she was making some tea last night when she got dizzy and lost her balance and fell backwards.  She did hit her head but did not lose consciousness.  She denies any other symptoms.   Fall       Home Medications Prior to Admission medications   Medication Sig Start Date End Date Taking? Authorizing Provider  aspirin 81 MG chewable tablet Chew 81 mg by mouth daily.    [provider]  cetirizine (ZYRTEC) 10 MG tablet Take 10 mg by mouth daily.    [provider]  Cholecalciferol (PA VITAMIN D-3 GUMMY PO) Chew one (1) gummy by mouth daily.    [provider]  Dulaglutide (TRULICITY) 5.17 OH/6.0VP SOPN Inject 0.75 mg into the skin once a week. 03/24/21   [provider]  empagliflozin (JARDIANCE) 25 MG TABS tablet Take 25 mg by mouth daily.    [provider]  escitalopram (LEXAPRO) 10 MG tablet Take 1 tablet by mouth daily.    [provider]  ethynodiol-ethinyl estradiol (ZOVIA 1/35, 28,) 1-35 MG-MCG tablet Take 1 tablet by mouth daily 06/28/21     glycopyrrolate (ROBINUL) 2 MG tablet Take 1 tablet (2 mg total) by mouth 2 (two) times daily. 12/16/19   Ladene Artist, MD  hydrochlorothiazide (MICROZIDE) 12.5 MG capsule Take 1 capsule by mouth every morning 02/18/21     lisdexamfetamine (VYVANSE) 20 MG capsule Take 20 mg by mouth daily.    [provider]  lisinopril  (ZESTRIL) 10 MG tablet Take 1 tablet (10 mg total) by mouth daily. 12/09/18   Josue Hector, MD  loperamide (IMODIUM) 2 MG capsule Take 1 capsule (2 mg total) by mouth 4 (four) times daily as needed for diarrhea or loose stools. 04/01/21   Luna Fuse, MD  meclizine (ANTIVERT) 25 MG tablet Take 1 tablet (25 mg total) by mouth 3 (three) times daily as needed for up to 30 doses for dizziness. 03/06/21   Wyvonnia Dusky, MD  Multiple Vitamins-Minerals (ALIVE WOMENS GUMMY PO) Chew one (1) gummy by mouth daily.    [provider]  nitrofurantoin, macrocrystal-monohydrate, (MACROBID) 100 MG capsule Take 100 mg by mouth 2 (two) times daily.    [provider]  nitroGLYCERIN (NITROSTAT) 0.4 MG SL tablet Place 1 tablet (0.4 mg total) under the tongue every 5 (five) minutes as needed for chest pain. 12/20/13   Lendon Colonel, NP  ondansetron (ZOFRAN-ODT) 4 MG disintegrating tablet Take 1 tablet (4 mg total) by mouth every 8 (eight) hours as needed for up to 4 doses for nausea or vomiting. 04/01/21   Luna Fuse, MD  phenazopyridine (PYRIDIUM) 200 MG tablet Take 1 tablet by mouth 3 (three) times daily. 08/24/21   [provider]  rosuvastatin (CRESTOR) 20 MG tablet Take 20 mg by mouth daily.    [provider]  SEMGLEE, YFGN, 100 UNIT/ML  Pen Inject into the skin. 11/28/20   [provider]      Allergies    Other, Sulfa antibiotics, Exenatide, and Metformin hcl    Review of Systems   Review of Systems  All other systems reviewed and are negative.   Physical Exam Updated Vital Signs BP (!) 140/93   Pulse 86   Temp 97.9 F (36.6 C) (Oral)   Resp 16   Ht '5\' 3"'$  (1.6 m)   Wt 68 kg   SpO2 96%   BMI 26.57 kg/m  Physical Exam Vitals and nursing note reviewed.  Constitutional:      General: She is not in acute distress.    Appearance: Normal appearance.  HENT:     Head: Normocephalic and atraumatic.  Eyes:     General:        Right eye: No  discharge.        Left eye: No discharge.  Cardiovascular:     Comments: Regular rate and rhythm.  S1/S2 are distinct without any evidence of murmur, rubs, or gallops.  Radial pulses are 2+ bilaterally.  Dorsalis pedis pulses are 2+ bilaterally.  No evidence of pedal edema. Pulmonary:     Comments: Clear to auscultation bilaterally.  Normal effort.  No respiratory distress.  No evidence of wheezes, rales, or rhonchi heard throughout. Abdominal:     General: Abdomen is flat. Bowel sounds are normal. There is no distension.     Tenderness: There is no abdominal tenderness. There is no guarding or rebound.  Musculoskeletal:        General: Normal range of motion.     Cervical back: Normal range of motion and neck supple.     Comments: Mild swelling to the right wrist.   Skin:    General: Skin is warm and dry.     Findings: No rash.  Neurological:     General: No focal deficit present.     Mental Status: She is alert.  Psychiatric:        Mood and Affect: Mood normal.        Behavior: Behavior normal.     ED Results / Procedures / Treatments   Labs (all labs ordered are listed, but only abnormal results are displayed) Labs Reviewed  BASIC METABOLIC PANEL - Abnormal; Notable for the following components:      Result Value   Glucose, Bld 132 (*)    Calcium 10.4 (*)    All other components within normal limits  CBC - Abnormal; Notable for the following components:   RBC 5.82 (*)    Hemoglobin 16.8 (*)    HCT 48.4 (*)    All other components within normal limits  URINALYSIS, ROUTINE W REFLEX MICROSCOPIC - Abnormal; Notable for the following components:   Specific Gravity, Urine >1.046 (*)    Glucose, UA >1,000 (*)    Ketones, ur 15 (*)    Protein, ur TRACE (*)    All other components within normal limits    EKG EKG Interpretation  Date/Time:  Tuesday January 31 2022 14:59:53 EST Ventricular Rate:  99 PR Interval:  188 QRS Duration: 84 QT Interval:  350 QTC  Calculation: 449 R Axis:   96 Text Interpretation: Normal sinus rhythm Rightward axis Abnormal ECG When compared with ECG of 01-Apr-2021 07:47, PREVIOUS ECG IS PRESENT Confirmed by Regan Lemming ([id number]) on 01/31/2022 9:41:48 PM  Radiology CT Head Wo Contrast  Result Date: 01/31/2022 CLINICAL DATA:  Head trauma EXAM: CT HEAD  WITHOUT CONTRAST CT CERVICAL SPINE WITHOUT CONTRAST TECHNIQUE: Multidetector CT imaging of the head and cervical spine was performed following the standard protocol without intravenous contrast. Multiplanar CT image reconstructions of the cervical spine were also generated. RADIATION DOSE REDUCTION: This exam was performed according to the departmental dose-optimization program which includes automated exposure control, adjustment of the mA and/or kV according to patient size and/or use of iterative reconstruction technique. COMPARISON:  08/27/2014 FINDINGS: CT HEAD FINDINGS Brain: There is no mass, hemorrhage or extra-axial collection. The size and configuration of the ventricles and extra-axial CSF spaces are normal. The brain parenchyma is normal, without acute or chronic infarction. Vascular: No abnormal hyperdensity of the major intracranial arteries or dural venous sinuses. No intracranial atherosclerosis. Skull: The visualized skull base, calvarium and extracranial soft tissues are normal. Sinuses/Orbits: No fluid levels or advanced mucosal thickening of the visualized paranasal sinuses. No mastoid or middle ear effusion. The orbits are normal. CT CERVICAL SPINE FINDINGS Alignment: Normal. Skull base and vertebrae: No acute fracture. No primary bone lesion or focal pathologic process. Soft tissues and spinal canal: No prevertebral fluid or swelling. No visible canal hematoma. Disc levels: Degenerative disc disease is greatest at C5-6 and C6-7. There is a mild bilateral foraminal stenosis at C5-6. Upper chest: Negative. Other: None IMPRESSION: 1. No acute intracranial abnormality. 2. No  acute fracture or static subluxation of the cervical spine. Electronically Signed   By: Ulyses Jarred M.D.   On: 01/31/2022 20:54   CT Cervical Spine Wo Contrast  Result Date: 01/31/2022 CLINICAL DATA:  Head trauma EXAM: CT HEAD WITHOUT CONTRAST CT CERVICAL SPINE WITHOUT CONTRAST TECHNIQUE: Multidetector CT imaging of the head and cervical spine was performed following the standard protocol without intravenous contrast. Multiplanar CT image reconstructions of the cervical spine were also generated. RADIATION DOSE REDUCTION: This exam was performed according to the departmental dose-optimization program which includes automated exposure control, adjustment of the mA and/or kV according to patient size and/or use of iterative reconstruction technique. COMPARISON:  08/27/2014 FINDINGS: CT HEAD FINDINGS Brain: There is no mass, hemorrhage or extra-axial collection. The size and configuration of the ventricles and extra-axial CSF spaces are normal. The brain parenchyma is normal, without acute or chronic infarction. Vascular: No abnormal hyperdensity of the major intracranial arteries or dural venous sinuses. No intracranial atherosclerosis. Skull: The visualized skull base, calvarium and extracranial soft tissues are normal. Sinuses/Orbits: No fluid levels or advanced mucosal thickening of the visualized paranasal sinuses. No mastoid or middle ear effusion. The orbits are normal. CT CERVICAL SPINE FINDINGS Alignment: Normal. Skull base and vertebrae: No acute fracture. No primary bone lesion or focal pathologic process. Soft tissues and spinal canal: No prevertebral fluid or swelling. No visible canal hematoma. Disc levels: Degenerative disc disease is greatest at C5-6 and C6-7. There is a mild bilateral foraminal stenosis at C5-6. Upper chest: Negative. Other: None IMPRESSION: 1. No acute intracranial abnormality. 2. No acute fracture or static subluxation of the cervical spine. Electronically Signed   By: Ulyses Jarred M.D.   On: 01/31/2022 20:54    Procedures Procedures    Medications Ordered in ED Medications - No data to display  ED Course/ Medical Decision Making/ A&P Clinical Course as of 02/02/22 1735  Tue Jan 31, 2022  2057 CBC(!) Normal [CF]  2058 Urinalysis, Routine w reflex microscopic Urine, Clean Catch(!) Glucosuria without signs of infection. [CF]  7902 Basic metabolic panel(!) Normal.  [CF]  2058 DG Toe 5th Right I personally ordered and  interpreted this study and do not see any evidence of fracture.  I do agree with the radiologist interpretation. [CF]  2058 DG Wrist Complete Right I personally interpreted the study and do not see any evidence of fracture.  I do agree with the radiologist interpretation. [CF]  2058 CT Cervical Spine Wo Contrast [CF]    Clinical Course User Index [CF] Hendricks Limes, PA-C                           Medical Decision Making LAVEYAH ORIOL is a 57 y.o. female patient who presents to the emerged from today after mechanical fall.  Patient is currently at her baseline.  I have a low suspicion at this time for basilar stroke as the patient has been having vertigo for some time.  Patient's labs were normal.  He got CT imaging of the head and cervical spine to evaluate for intracranial or intracervical pathology.  I personally reviewed these images and do not see any evidence of such.  Wrist does not appear to be broken.  I will send her to her primary care doctor and/or neurologist for further evaluation.  Strict return precautions were discussed.  She is safe for discharge.   Amount and/or Complexity of Data Reviewed Labs: ordered. Decision-making details documented in ED Course. Radiology: ordered. Decision-making details documented in ED Course.    Final Clinical Impression(s) / ED Diagnoses Final diagnoses:  Fall, initial encounter  Right wrist pain    Rx / DC Orders ED Discharge Orders     None         Hendricks Limes, [place] 02/02/22 1736    Regan Lemming, MD 02/04/22 [phone]

## 2022-02-13 ENCOUNTER — Encounter: Payer: Self-pay | Admitting: Neurology

## 2022-02-13 ENCOUNTER — Ambulatory Visit: Payer: Managed Care, Other (non HMO) | Admitting: Neurology

## 2022-02-13 VITALS — BP 111/78 | HR 113 | Ht 63.0 in | Wt 150.0 lb

## 2022-02-13 DIAGNOSIS — R42 Dizziness and giddiness: Secondary | ICD-10-CM | POA: Insufficient documentation

## 2022-02-13 DIAGNOSIS — R269 Unspecified abnormalities of gait and mobility: Secondary | ICD-10-CM

## 2022-02-13 DIAGNOSIS — M5412 Radiculopathy, cervical region: Secondary | ICD-10-CM | POA: Diagnosis not present

## 2022-02-13 NOTE — Progress Notes (Signed)
ASSESSMENT AND PLAN  Lindsay Rios is a 57 y.o. female    Orthostatic blood pressure change, tachycardia  Most consistent with her deconditioning, lack of regular sleep schedule, worsening depression, with a background of diabetic small fiber neuropathy,  Advised her to increase walking, gradually increase stamina,  Optimize control of her diabetes,  She has pending MRI of the lumbar and thoracic spine, if there is no significant abnormality, she will only follow-up with her primary care and endocrinologist     DIAGNOSTIC DATA (LABS, IMAGING, TESTING) - I reviewed patient records, labs, notes, testing and imaging myself where available. Laboratory evaluations in March 2023 CBC showed hemoglobin 14.5, CMP showed normal creatinine 0.56 with elevated glucose 180,  MEDICAL HISTORY:  Lindsay Rios is a 57 year old female, seen in request by her primary care doctor Izora Gala, for evaluation of unsteady gait,   I reviewed and summarized the referring note. 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 There is moderately severe right and moderate left foraminal narrowing.  This could lead to right C6 nerve root compression. At C6-C7 there is broad disc bulging but there does not appear to be any nerve root impingement.   She was evaluated by neurosurgeon at that time, offered elective cervical decompression surgery, but she decided not to proceed at that time  She had few falls in past 3 months,  Virtual Visit via video UPDATE September 12 2021 She continue complains of abnormality, lack of stamina, even occasionally bowel and bladder incontinence, neck pain,  Personally reviewed MRI of cervical spine with patient on August 3 /2023: - At C5-6 disc bulging and uncovertebral joint hypertrophy with borderline mild spinal stenosis and severe right foraminal stenosis.   Update November 02, 2021: Patient return for electrodiagnostic study today, continue has constellation of complaints, generalized fatigue, lack of stamina, occasionally bowel and bladder incontinence  Bilateral upper extremity and intermittent lower extremity paresthesia  Reviewed neurosurgery evaluation by Dr Kathyrn Sheriff October 12, 2021, C5-6, broad-based disc osteophyte complex with right greater than left neuroforaminal stenosis mild spinal stenosis, not a surgical candidate, epidural injection planned  UPDATE Feb 13 2022: She is alone at today's visit, lost her father in November 2023, complains of depression, insomnia, and a lot of spitting up which impeded nighttime, frequent nap during the day, lost her job few years ago taking care of her parents, is considering applying for social disability  She complains of dizziness when standing up, fell shortly after taking a standing position went to emergency room January 31, 2021, personally reviewed CT head without contrast no acute abnormality  CT cervical spine, lost of C curvature, mild degenerative changes  Extensive laboratory evaluation in 2023 showed normal or negative CBC hemoglobin of 16.8, CMP calcium  of 10.4, normal HTLV 1 and 2 antibody,  Lyme titer, copper, ANA, ESR C-reactive protein CPK, TSH, folic acid, HIV, RPR, K44, vitamin D 29.8, A1c 7.7 previously up to 9.6  She has intermittent lower extremity paresthesia, EMG nerve conduction study in October 2023 showed no large fiber peripheral neuropathy,  Today she had noticeable orthostatic tachycardia, orthostatic blood pressure drop, also becomes symptomatic after standing up   Sitting down 129/88, H 118 Standing up 106/69, HR 123 Standing up one minute 115/74, HR 141.  Vestibular testing July 2023 at [facility] showed no evidence of peripheral vestibular deficit, no evidence of benign positional vertigo,   PHYSICAL EXAMNIATION: Sitting down 129/88, H 118 Standing up 106/69, HR 123 Standing up one minute 115/74, HR 141.  Gen: NAD, conversant, well nourised, well groomed                     Cardiovascular: Regular rate rhythm, no peripheral edema, warm, nontender. Eyes: Conjunctivae clear without exudates or hemorrhage Neck: Supple, no carotid bruits.  NEUROLOGICAL EXAM:  MENTAL STATUS: Speech/cognition: Awake, alert oriented to history taking and casual conversation  CRANIAL NERVES: CN II: Visual fields are full to confrontation.  Pupils are round equal and briskly reactive to light. CN III, IV, VI: extraocular movement are normal. No ptosis. CN V: Facial sensation is intact to pinprick in all 3 divisions bilaterally.  CN VII: Face is symmetric with normal eye closure and smile. CN VIII: Hearing is normal to casual conversation CN IX, X: Palate elevates symmetrically. Phonation is normal. CN XI: Head turning and shoulder shrug are intact CN XII: Tongue is midline with normal movements and no atrophy.  MOTOR: Upper and lower extremity motor strength is normal  REFLEXES: Reflexes are 2+ and symmetric at the biceps, triceps, knees, and ankles. Plantar responses are flexor.  SENSORY: Mild length-dependent decreased light touch, pinprick, vibratory  sensation,  COORDINATION: Rapid alternating movements and fine finger movements are intact. There is no dysmetria on finger-to-nose and heel-knee-shin.    GAIT/STANCE: Push-up to get up from seated position, cautious, stiff   REVIEW OF SYSTEMS:  Full 14 system review of systems performed and notable only for as above All other review of systems were negative.   ALLERGIES: Allergies  Allergen Reactions   Other Nausea Only and Other (See Comments)    REACTION: swelling under skin after shots Treseba    Sulfa Antibiotics Nausea Only    REACTION: nausea   Exenatide     Other reaction(s): dizzy, upset stomach   Metformin Hcl     Other reaction(s): GI Upset (intolerance), stomach upset    HOME MEDICATIONS: Current Outpatient Medications  Medication Sig Dispense Refill   aspirin 81 MG chewable tablet Chew 81 mg by mouth daily.     buPROPion (WELLBUTRIN XL) 150 MG 24 hr tablet Take 150 mg by mouth every morning.     cetirizine (ZYRTEC) 10 MG tablet Take 10 mg by mouth daily.     Cholecalciferol (PA VITAMIN D-3 GUMMY PO) Chew one (1) gummy by mouth daily.     Dulaglutide (TRULICITY) 0.10 UV/2.5DG SOPN Inject 0.75 mg into the skin once a week.     empagliflozin (JARDIANCE) 25 MG TABS tablet Take 25 mg by mouth daily.     escitalopram (LEXAPRO) 20 MG tablet Take 1 tablet by mouth daily.     ethynodiol-ethinyl estradiol (ZOVIA 1/35, 28,) 1-35 MG-MCG tablet Take 1 tablet by mouth daily 84 tablet 4  glycopyrrolate (ROBINUL) 2 MG tablet Take 1 tablet (2 mg total) by mouth 2 (two) times daily. 180 tablet 3   hydrochlorothiazide (MICROZIDE) 12.5 MG capsule Take 1 capsule by mouth every morning 90 capsule 4   insulin lispro (HUMALOG) 100 UNIT/ML injection Inject 8 Units into the skin every evening.     lisinopril-hydrochlorothiazide (ZESTORETIC) 20-25 MG tablet Take 1 tablet by mouth daily.     loperamide (IMODIUM) 2 MG capsule Take 1 capsule (2 mg total) by mouth 4 (four) times daily as  needed for diarrhea or loose stools. 12 capsule 0   Multiple Vitamins-Minerals (ALIVE WOMENS GUMMY PO) Chew one (1) gummy by mouth daily.     nitrofurantoin, macrocrystal-monohydrate, (MACROBID) 100 MG capsule Take 100 mg by mouth 2 (two) times daily.     phenazopyridine (PYRIDIUM) 200 MG tablet Take 1 tablet by mouth 3 (three) times daily.     rosuvastatin (CRESTOR) 20 MG tablet Take 20 mg by mouth daily.     SEMGLEE, YFGN, 100 UNIT/ML Pen Inject into the skin.     Current Facility-Administered Medications  Medication Dose Route Frequency Provider Last Rate Last Admin   0.9 %  sodium chloride infusion  500 mL Intravenous Once Ladene Artist, MD        PAST MEDICAL HISTORY: Past Medical History:  Diagnosis Date   ADD (attention deficit disorder)    Allergy    Depression    Ejection fraction    Gestational diabetes 1999; 2002   History of chicken pox    Human papilloma virus    High Risk    Hyperlipidemia    Hypertension    Numbness and tingling    Osteopenia    Pneumonia 2012   Sleep apnea with use of continuous positive airway pressure (CPAP)    Type II diabetes mellitus ([place])    "borderline; I'm on Metformin"    PAST SURGICAL HISTORY: Past Surgical History:  Procedure Laterality Date   ANAL RECTAL MANOMETRY N/A 03/17/2015   Procedure: ANO RECTAL MANOMETRY;  Surgeon: Mauri Pole, MD;  Location: [facility];  Service: Endoscopy;  Laterality: N/A;   BREAST LUMPECTOMY WITH RADIOACTIVE SEED LOCALIZATION Right 06/05/2019   Procedure: [name];  Surgeon: Coralie Keens, MD;  Location: [place];  Service: General;  Laterality: Right;   BREAST RECONSTRUCTION WITH PLACEMENT OF TISSUE EXPANDER AND FLEX HD (ACELLULAR HYDRATED DERMIS) Bilateral 04/19/2020   Procedure: IMMEDIATE BILATERAL BREAST RECONSTRUCTION WITH PLACEMENT OF TISSUE EXPANDER AND FLEX HD (ACELLULAR HYDRATED DERMIS);  Surgeon: Wallace Going, DO;  Location: [facility];  Service: Plastics;  Laterality: Bilateral;   LEFT HEART CATHETERIZATION WITH CORONARY ANGIOGRAM N/A 12/22/2013   Procedure: LEFT HEART CATHETERIZATION WITH CORONARY ANGIOGRAM;  Surgeon: Jettie Booze, MD;  Location: [facility] CATH LAB;  Service: Cardiovascular;  Laterality: N/A;   NIPPLE SPARING MASTECTOMY Bilateral 04/19/2020   Procedure: BILATERAL NIPPLE SPARING MASTECTOMY;  Surgeon: Coralie Keens, MD;  Location: [place];  Service: General;  Laterality: Bilateral;   REMOVAL OF TISSUE EXPANDER AND PLACEMENT OF IMPLANT Bilateral 08/12/2020   Procedure: REMOVAL OF TISSUE EXPANDER AND PLACEMENT OF IMPLANT;  Surgeon: Wallace Going, DO;  Location: [place];  Service: Plastics;  Laterality: Bilateral;  90 min   SHOULDER ARTHROSCOPY W/ ROTATOR CUFF REPAIR Right 1990's   VAGINAL HYSTERECTOMY  2013   HPV/Cervical Dysplasia; partial    FAMILY HISTORY: Family History  Problem Relation  Age of Onset   Hyperlipidemia Mother    Hyperlipidemia Father    Hypertension Father    Diabetes Father    Heart disease Father    Stroke Father    Cancer Maternal Grandmother        Colon Cancer   Alzheimer's disease Maternal Grandmother    Colon cancer Maternal Grandmother    Heart disease Brother 28       Myocardial Infarction   Breast cancer Paternal Aunt    Esophageal cancer Neg Hx    Stomach cancer Neg Hx    Rectal cancer Neg Hx     SOCIAL HISTORY: Social History   Socioeconomic History   Marital status: Married    Spouse name: [name] (Separated)    Number of children: 2   Years of education: 14   Highest education level: Not on file  Occupational History   Occupation: Homemaker     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   Occupation: Full- Time Student    Education: [employer]    Tobacco Use: She has never smoked.     Alcohol Use:  Rarely   Drug Use:  None   Diet:  Regular   Exercise:  None   Hobbies: Shopping      Social Determinants of Health   Financial Resource Strain: Not on file  Food Insecurity: Not on file  Transportation Needs: Not on file  Physical Activity: Not on file  Stress: Not on file  Social Connections: Not on file  Intimate Partner Violence: Not on file      Marcial Pacas, M.D. Ph.D.  [facility] [address] [place], [place] [address] Ph: [phone] Fax: [phone]  CC:  [name], [place]. [employer] [address],  [place] [address]  [name], [place]

## 2022-02-17 ENCOUNTER — Ambulatory Visit
Admission: RE | Admit: 2022-02-17 | Discharge: 2022-02-17 | Disposition: A | Discharge status: Home / Self Care | Payer: Managed Care, Other (non HMO) | Source: Ambulatory Visit | Attending: Neurology | Admitting: Neurology

## 2022-02-17 DIAGNOSIS — M5412 Radiculopathy, cervical region: Secondary | ICD-10-CM

## 2022-02-17 DIAGNOSIS — R159 Full incontinence of feces: Secondary | ICD-10-CM

## 2022-02-17 DIAGNOSIS — R269 Unspecified abnormalities of gait and mobility: Secondary | ICD-10-CM

## 2022-06-12 ENCOUNTER — Encounter: Payer: Self-pay | Admitting: Neurology

## 2022-07-12 ENCOUNTER — Encounter: Payer: Self-pay | Admitting: Neurology

## 2022-07-12 ENCOUNTER — Ambulatory Visit: Payer: Managed Care, Other (non HMO) | Admitting: Neurology

## 2022-07-12 VITALS — BP 134/78 | HR 78 | Ht 61.0 in | Wt 142.8 lb

## 2022-07-12 DIAGNOSIS — R2681 Unsteadiness on feet: Secondary | ICD-10-CM

## 2022-07-12 DIAGNOSIS — R42 Dizziness and giddiness: Secondary | ICD-10-CM | POA: Diagnosis not present

## 2022-07-12 DIAGNOSIS — G629 Polyneuropathy, unspecified: Secondary | ICD-10-CM | POA: Diagnosis not present

## 2022-07-12 NOTE — Progress Notes (Signed)
Many falls falling backwards she has seen neuro, has had many scans here for second opt. She hurt her hand on fall in Jan. OBGYN sent a referral to hand specialty. 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

## 2022-07-12 NOTE — Progress Notes (Signed)
NEUROLOGY CONSULTATION NOTE  LOUNETTE RAIMONDO MRN: [id number] DOB: [birth year]  Referring provider: Milus Height, PA Primary care provider: Milus Height, PA  Reason for consult:  second opinion for dizziness  Thank you for your kind referral of Lindsay Rios for consultation of the above symptoms. Although her history is well known to you, please allow me to reiterate it for the purpose of our medical record. She is alone in the office today. Records and images were personally reviewed where available.   HISTORY OF PRESENT ILLNESS: This is a 57 year old left-handed woman with a history of hypertension, DM2, hyperlipidemia, breast cancer, OSA on CPAP, osteopenia, cervical spondylosis with myelopathy and radiculopathy, ADD, presenting for second opinion regarding dizziness. She was evaluated by neurologist Dr. Terrace Arabia in January 2024 for similar symptoms. She reports symptoms started a couple of years ago but have gotten worse. She describes difficulty with balance, she has to hold on to walls when getting up. When she falls, she would fall backwards and loses strength in both legs and arms. When supine, the dizziness may have a spinning component. If she turns or moves around a lot, there is also some spinning. She had seen ENT and was told it is not her inner ear. When she stood up, her gait is off. She describes a constant sensation of dizziness but worse when she stands up and feels she cannot walk. She states she falls constantly. She bent to pick up a dropped spoon last month and fell backwards, hitting her head on the cabinet. She cannot wear any type of heels due to balance issues. If she touches something with her foot, it may throw her off balance. She denies any presyncopal sensation, no loss of consciousness. No staring episodes or confusion. No headaches, diplopia, dysarthria/dysphagia (but has noticed she get choked more when drinking from a straw), hearing loss, or tinnitus. She  has numbness in her fingers and toes. HbA1c last month was 6.9. She has bowel and bladder issues and has taken some of her father's Myrbetriq which helped her some. She also takes Imodium every other day. No perineal numbness. She has neck and back pain. Neck injections did not help. She did PT twice a week a year ago with [facility] but they were charging $50 for each visit and it was cost prohibitive. She was started on Lexapro in January 2023, she has not noticed much change with mood.  Diagnostic Data available: Vestibular testing July 2023 at [facility] showed no evidence of peripheral vestibular deficit, no evidence of benign positional vertigo.  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   EMG/NCV at [facility] in 10/2021 which was mildly abnormal with evidence of chronic right C7-8 radiculopathy, no evidence of active process. There is also evidence of mild bilateral L4 radiculopathy, no evidence of large fiber peripheral neuropathy or focal neuropathy.   She was seen by neurosurgeon Dr. Conchita Paris in 09/2021 for C5-6 broad-based disc osteophyte complex with right greater than left neuroforaminal stenosis, mild spinal stenosis. Not a surgical candidate.  At Dr. Zannie Cove office, she was noted to have orthostatic tachycardia and hypotension, symptomatic after standing. Per notes, Sitting down 129/88, H 118; Standing up 106/69, HR 123; Standing up one minute 115/74, HR 141.  MRI thoracic and lumbar spine 01/2022: spinal cord appears normal. There were degenerative changes at several levels, mild to moderate left lateral recess stenosis  and borderline spinal stenosis at L4-5.   CT cervical spine 01/2022: Degenerative disc disease is greatest at C5-6 and C6-7.There is a mild bilateral foraminal stenosis at C5-6.    PAST MEDICAL HISTORY: Past Medical History:  Diagnosis  Date   ADD (attention deficit disorder)    Allergy    Depression    Ejection fraction    Gestational diabetes 1999; 2002   History of chicken pox    Human papilloma virus    High Risk    Hyperlipidemia    Hypertension    Numbness and tingling    Osteopenia    Pneumonia 2012   Sleep apnea with use of continuous positive airway pressure (CPAP)    Type II diabetes mellitus (HCC)    "borderline; I'm on Metformin"    PAST SURGICAL HISTORY: Past Surgical History:  Procedure Laterality Date   ANAL RECTAL MANOMETRY N/A 03/17/2015   Procedure: ANO RECTAL MANOMETRY;  Surgeon: Napoleon Form, MD;  Location: [facility];  Service: Endoscopy;  Laterality: N/A;   BREAST LUMPECTOMY WITH RADIOACTIVE SEED LOCALIZATION Right 06/05/2019   Procedure: [name];  Surgeon: Abigail Miyamoto, MD;  Location: [facility];  Service: General;  Laterality: Right;   BREAST RECONSTRUCTION WITH PLACEMENT OF TISSUE EXPANDER AND FLEX HD (ACELLULAR HYDRATED DERMIS) Bilateral 04/19/2020   Procedure: IMMEDIATE BILATERAL BREAST RECONSTRUCTION WITH PLACEMENT OF TISSUE EXPANDER AND FLEX HD (ACELLULAR HYDRATED DERMIS);  Surgeon: Peggye Form, DO;  Location: [facility];  Service: Plastics;  Laterality: Bilateral;   LEFT HEART CATHETERIZATION WITH CORONARY ANGIOGRAM N/A 12/22/2013   Procedure: LEFT HEART CATHETERIZATION WITH CORONARY ANGIOGRAM;  Surgeon: Corky Crafts, MD;  Location[facility];  Service: Cardiovascular;  Laterality: N/A;   NIPPLE SPARING MASTECTOMY Bilateral 04/19/2020   Procedure: BILATERAL NIPPLE SPARING MASTECTOMY;  Surgeon: Abigail Miyamoto, MD;  Location: [facility];  Service: General;  Laterality: Bilateral;   REMOVAL OF TISSUE EXPANDER AND PLACEMENT OF IMPLANT Bilateral 08/12/2020   Procedure: REMOVAL OF TISSUE EXPANDER AND PLACEMENT OF IMPLANT;  Surgeon: Peggye Form, DO;   Location: [facility];  Service: Plastics;  Laterality: Bilateral;  90 min   SHOULDER ARTHROSCOPY W/ ROTATOR CUFF REPAIR Right 1990's   VAGINAL HYSTERECTOMY  2013   HPV/Cervical Dysplasia; partial    MEDICATIONS: Current Outpatient Medications on File Prior to Visit  Medication Sig Dispense Refill   amphetamine-dextroamphetamine (ADDERALL XR) 20 MG 24 hr capsule Take 20 mg by mouth daily.     aspirin 81 MG chewable tablet Chew 81 mg by mouth daily.     cetirizine (ZYRTEC) 10 MG tablet Take 10 mg by mouth daily.     Cholecalciferol (PA VITAMIN D-3 GUMMY PO) Chew one (1) gummy by mouth daily.     empagliflozin (JARDIANCE) 25 MG TABS tablet Take 25 mg by mouth daily.     escitalopram (LEXAPRO) 20 MG tablet Take 1 tablet by mouth daily.     lisinopril-hydrochlorothiazide (ZESTORETIC) 20-25 MG tablet Take 1 tablet by mouth daily.     loperamide (IMODIUM) 2 MG capsule Take 1 capsule (2 mg total) by mouth 4 (four) times daily as needed for diarrhea or loose stools. 12 capsule 0   Multiple Vitamins-Minerals (ALIVE WOMENS GUMMY PO) Chew one (1) gummy by mouth daily.     OZEMPIC, 0.25 OR 0.5 MG/DOSE, 2 MG/3ML SOPN Inject 0.25 mg into the skin once a week.     rosuvastatin (  CRESTOR) 40 MG tablet Take 40 mg by mouth daily.     Current Facility-Administered Medications on File Prior to Visit  Medication Dose Route Frequency Provider Last Rate Last Admin   0.9 %  sodium chloride infusion  500 mL Intravenous Once Meryl Dare, MD        ALLERGIES: Allergies  Allergen Reactions   Other Nausea Only and Other (See Comments)    REACTION: swelling under skin after shots Treseba    Sulfa Antibiotics Nausea Only    REACTION: nausea   Exenatide     Other reaction(s): dizzy, upset stomach   Metformin Hcl     Other reaction(s): GI Upset (intolerance), stomach upset    FAMILY HISTORY: Family History  Problem Relation Age of Onset   Hyperlipidemia Mother    Hyperlipidemia Father     Hypertension Father    Diabetes Father    Heart disease Father    Stroke Father    Cancer Maternal Grandmother        Colon Cancer   Alzheimer's disease Maternal Grandmother    Colon cancer Maternal Grandmother    Heart disease Brother 46       Myocardial Infarction   Breast cancer Paternal Aunt    Esophageal cancer Neg Hx    Stomach cancer Neg Hx    Rectal cancer Neg Hx     SOCIAL HISTORY: Social History   Socioeconomic History   Marital status: Married    Spouse name: Arlys John (Separated)    Number of children: 2   Years of education: 14   Highest education level: Not on file  Occupational History   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   Occupation: Full- Time Student    Education: [employer] (C[employer] Program)    Tobacco Use: She has never smoked.     Alcohol Use:  Rarely   Drug Use:  None   Diet:  Regular   Exercise:  None   Hobbies: Shopping      Left handed    Social Determinants of Health   Financial Resource Strain: Not on file  Food Insecurity: Not on file  Transportation Needs: Not on file  Physical Activity: Not on file  Stress: Not on file  Social Connections: Not on file  Intimate Partner Violence: Not on file     PHYSICAL EXAM: Vitals:   07/12/22 1001  BP: 134/78  Pulse: 78  SpO2: 90%    General: No acute distress Head:  Normocephalic/atraumatic Skin/Extremities: No rash, no  edema Neurological Exam: Mental status: alert and awake, no dysarthria or aphasia, Fund of knowledge is appropriate.  Attention and concentration are normal.  Cranial nerves: CN I: not tested CN II: pupils equal, round, visual fields intact CN III, IV, VI:  full range of motion, no nystagmus,  no ptosis CN V: facial sensation intact CN VII: upper and lower face symmetric CN VIII: hearing intact to conversation Bulk & Tone: normal, no fasciculations, no cogwheeling. Motor: 5/5 throughout with no pronator drift. Sensation: intact to light touch, cold, pin on both UE and LE, decreased vibration sense to ankles bilaterally.  No extinction to double simultaneous stimulation.  Romberg test slight sway Deep Tendon Reflexes: brisk +2 throughout except for absent ankle jerks bilaterally, +hyperactive pectoralis reflex bilaterally Plantar responses: downgoing upgoing bilaterally Cerebellar: no incoordination on finger to nose testing Gait: slow and cautious, wide-based, no ataxia Tremor: none Good finger taps, rapid alternating movements. Negative pull test   IMPRESSION: This is a 57 year old left-handed woman with a history of hypertension, DM2, hyperlipidemia, breast cancer, OSA on CPAP, osteopenia, cervical spondylosis with myelopathy and radiculopathy, ADD, presenting for second opinion regarding dizziness. Neurological exam shows a length-dependent neuropathy as well as brisk reflexes. She has had an extensive evaluation with imaging of brain and spinal cord with no significant changes seen. EMG/NCV showed radiculopathy which would also not explain symptoms. We discussed doing a skin biopsy to assess for small fiber neuropathy. We discussed doing physical therapy for balance and gaze stabilization exercises. If no improvement in symptoms, we may consider starting medications used to Persistent Postural Perceptual Dizziness (PPPD). Follow-up in 3 months, call for any changes.    Thank you for  allowing me to participate in the care of this patient. Please do not hesitate to call for any questions or concerns.   Patrcia Dolly, M.D.  CC: Milus Height, [place]

## 2022-07-12 NOTE — Patient Instructions (Signed)
Good to meet you.  Schedule nerve biopsy  2. Referral will be sent for Balance therapy/gaze stabilization exercises at [facility]  3. We may consider starting a medication that potentially helps with Persistent Postural Perceptual Dizziness on your follow-up in 3 months if no improvement with physical therapy

## 2022-07-28 ENCOUNTER — Ambulatory Visit: Payer: Managed Care, Other (non HMO) | Attending: Neurology | Admitting: Physical Therapy

## 2022-07-28 ENCOUNTER — Encounter: Payer: Self-pay | Admitting: Physical Therapy

## 2022-07-28 VITALS — BP 90/72 | HR 83

## 2022-07-28 DIAGNOSIS — R2689 Other abnormalities of gait and mobility: Secondary | ICD-10-CM | POA: Diagnosis present

## 2022-07-28 DIAGNOSIS — R2681 Unsteadiness on feet: Secondary | ICD-10-CM | POA: Insufficient documentation

## 2022-07-28 DIAGNOSIS — R42 Dizziness and giddiness: Secondary | ICD-10-CM | POA: Insufficient documentation

## 2022-07-28 DIAGNOSIS — G629 Polyneuropathy, unspecified: Secondary | ICD-10-CM | POA: Insufficient documentation

## 2022-07-28 NOTE — Therapy (Signed)
OUTPATIENT PHYSICAL THERAPY VESTIBULAR EVALUATION     Patient Name: Lindsay Rios MRN: [id number] DOB:[birth year], 57 y.o., female Today's Date: 07/28/2022  END OF SESSION:  PT End of Session - 07/28/22 1244     Visit Number 1    Number of Visits 5    Date for PT Re-Evaluation 09/26/22    Authorization Type [employer] - $50 co pay    PT Start Time 1234    PT Stop Time 1315    PT Time Calculation (min) 41 min    Equipment Utilized During Treatment Gait belt    Activity Tolerance Patient tolerated treatment well    Behavior During Therapy WFL for tasks assessed/performed             Past Medical History:  Diagnosis Date   ADD (attention deficit disorder)    Allergy    Depression    Ejection fraction    Gestational diabetes 1999; 2002   History of chicken pox    Human papilloma virus    High Risk    Hyperlipidemia    Hypertension    Numbness and tingling    Osteopenia    Pneumonia 2012   Sleep apnea with use of continuous positive airway pressure (CPAP)    Type II diabetes mellitus (HCC)    "borderline; I'm on Metformin"   Past Surgical History:  Procedure Laterality Date   ANAL RECTAL MANOMETRY N/A 03/17/2015   Procedure: ANO RECTAL MANOMETRY;  Surgeon: Napoleon Form, MD;  Location: [facility];  Service: Endoscopy;  Laterality: N/A;   BREAST LUMPECTOMY WITH RADIOACTIVE SEED LOCALIZATION Right 06/05/2019   Procedure: [name];  Surgeon: Abigail Miyamoto, MD;  Location: [facility];  Service: General;  Laterality: Right;   BREAST RECONSTRUCTION WITH PLACEMENT OF TISSUE EXPANDER AND FLEX HD (ACELLULAR HYDRATED DERMIS) Bilateral 04/19/2020   Procedure: IMMEDIATE BILATERAL BREAST RECONSTRUCTION WITH PLACEMENT OF TISSUE EXPANDER AND FLEX HD (ACELLULAR HYDRATED DERMIS);  Surgeon: Peggye Form, DO;  Location: [facility];  Service: Plastics;  Laterality: Bilateral;   LEFT HEART  CATHETERIZATION WITH CORONARY ANGIOGRAM N/A 12/22/2013   Procedure: LEFT HEART CATHETERIZATION WITH CORONARY ANGIOGRAM;  Surgeon: Corky Crafts, MD;  Location: [facility];  Service: Cardiovascular;  Laterality: N/A;   NIPPLE SPARING MASTECTOMY Bilateral 04/19/2020   Procedure: BILATERAL NIPPLE SPARING MASTECTOMY;  Surgeon: Abigail Miyamoto, MD;  Location: [facility];  Service: General;  Laterality: Bilateral;   REMOVAL OF TISSUE EXPANDER AND PLACEMENT OF IMPLANT Bilateral 08/12/2020   Procedure: REMOVAL OF TISSUE EXPANDER AND PLACEMENT OF IMPLANT;  Surgeon: Peggye Form, DO;  Location:  SURGERY CENTER;  Service: Plastics;  Laterality: Bilateral;  90 min   SHOULDER ARTHROSCOPY W/ ROTATOR CUFF REPAIR Right 1990's   VAGINAL HYSTERECTOMY  2013   HPV/Cervical Dysplasia; partial   Patient Active Problem List   Diagnosis Date Noted   Dizziness 02/13/2022   Incontinence of feces 11/02/2021   Right cervical radiculopathy 09/12/2021   Gait abnormality 08/25/2021   History of mastectomy 06/14/2021   Benign paroxysmal positional vertigo of left ear 04/26/2021   Poorly controlled diabetes mellitus (HCC) 04/26/2021   Acquired absence of breast 04/27/2020   Breast cancer (HCC) 04/19/2020   Atypical ductal hyperplasia of right breast 07/03/2019   Osteopenia 03/20/2019   Irritable bowel syndrome 03/18/2019   Chest pain radiating to jaw 04/12/2016   OSA on CPAP 04/12/2016   Family history of early  CAD 04/12/2016   Vitamin D deficiency 06/07/2015   Ulnar neuropathy at elbow of left upper extremity 05/25/2015   Constipation    Spondylosis, cervical, with myelopathy 01/11/2015   HTN (hypertension) 09/09/2014   Plantar fasciitis, bilateral 09/09/2014   HLD (hyperlipidemia) 02/22/2014   DM type 2 (diabetes mellitus, type 2) (HCC) 02/09/2014   Depression 06/14/2013   Stress and adjustment reaction 06/14/2013   Attention or concentration deficit 06/02/2012    Allergic rhinitis 09/25/2006    PCP: Milus Height, PA  REFERRING PROVIDER: Van Clines, MD  REFERRING DIAG: R42 (ICD-10-CM) - Dizziness R26.81 (ICD-10-CM) - Gait instability G62.9 (ICD-10-CM) - Neuropathy  THERAPY DIAG:  Unsteadiness on feet  Other abnormalities of gait and mobility  Dizziness and giddiness  ONSET DATE: 07/12/2022  Rationale for Evaluation and Treatment: Rehabilitation  SUBJECTIVE:   SUBJECTIVE STATEMENT: Gets off balance really easily. Gets dizzy. When getting up in the morning has to sit at the edge of the bed before getting up. When walking, has to hold onto walls. Reports this has been going on for a while. 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 Mainly feels off balance, and sometimes has spinning. When laying on her back and closes her eyes, will feel drunk and spinning. Reports anything will make her feel off balance. Denies lightheadedness. Was found to have orthostatic hypotension at the neurologist. Going to follow-up with her neurologist or PCP. Has had a lot of falls, but hasn't gotten hurt. Always falls backwards. Lost a lot of strength. Closing her eyes is difficult for balance   Pt accompanied by: self  PERTINENT HISTORY: PMH: hypertension, DM2, hyperlipidemia, breast cancer, OSA on CPAP, cervical spondylosis with myelopathy and radiculopathy, ADD , osteopenia   Per Dr. Karel Jarvis: Vestibular testing July [facility]23 at [facility] showed no evidence of peripheral vestibular deficit, no evidence of benign positional vertigo,  Orthostatic blood pressure change, tachycardia: Most consistent with her deconditioning, lack of regular sleep schedule, worsening depression, with a background of diabetic small fiber neuropathy, Constant feeling of dizziness even when sitting or lying down, worse when she stands up   PAIN:  Are you having pain? No  Vitals:   07/28/22 1249 07/28/22 1253   BP: 100/80 90/72  Pulse: 83     Sitting, Standing Reporting no lightheadedness   PRECAUTIONS: Fall  WEIGHT BEARING RESTRICTIONS: No  FALLS: Has patient fallen in last 6 months? Yes. Number of falls 6  LIVING ENVIRONMENT: Lives with: lives with their spouse Lives in: House/apartment Stairs: Has stairs to go into basement, but does not go down there  Has following equipment at home: None  PLOF: Independent If feeling dizzy, won't go anywhere   PATIENT GOALS: wants to quit getting so dizzy, stop falling.   OBJECTIVE:   DIAGNOSTIC FINDINGS: Neurosurgery evaluation by Dr Conchita Paris October 12, 2021, C5-6, broad-based disc osteophyte complex with right greater than left neuroforaminal stenosis mild spinal stenosis, not a surgical candidate, epidural injection planned  EMG nerve conduction study in October 2023 showed no large fiber peripheral neuropathy  CT cervical spine Mar 05, 2022: Degenerative disc disease is greatest at C5-6 and C6-7. 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   Otherwise unremarkable non-contrast MRI appearance of the brain.   COGNITION: Overall cognitive status: Within  functional limits for tasks assessed   SENSATION: Impaired due to neuropathy    TRANSFERS: Assistive device utilized: None  Sit to stand: Modified independence Stand to sit: Modified independence   GAIT: Gait pattern: decreased stride length, decreased trunk rotation, and wide BOS Distance walked: Clinic distances  Assistive device utilized: None Level of assistance: SBA and CGA Comments: Has to hold onto walls and furniture when ambulating at home. Wide BOS to compensate   VESTIBULAR ASSESSMENT:  GENERAL OBSERVATION: ambulates in with no AD, unsteadiness    SYMPTOM BEHAVIOR:  Subjective  history: See above   Non-Vestibular symptoms:  N/A  Type of dizziness: Imbalance (Disequilibrium), Spinning/Vertigo, and Unsteady with head/body turns  Frequency: Happening daily   Duration: Kinda depends   Aggravating factors: Induced by position change: supine to sit and sit to stand, Induced by motion: turning body quickly and turning head quickly, and heat   Relieving factors: no known relieving factors  Progression of symptoms: worse  OCULOMOTOR EXAM:  Ocular Alignment: normal  Ocular ROM: No Limitations  Spontaneous Nystagmus: absent  Gaze-Induced Nystagmus: absent  Smooth Pursuits: intact  Saccades: intact  VESTIBULAR - OCULAR REFLEX:   Slow VOR: Normal  VOR Cancellation: Normal  Head-Impulse Test: HIT Right: negative HIT Left: positive Little dizziness afterwards   Dynamic Visual Acuity: Static: Line 10 Dynamic: Line 7  3 line difference, mild dizziness afterwards  MOTION SENSITIVITY:  Motion Sensitivity Quotient Intensity: 0 = none, 1 = Lightheaded, 2 = Mild, 3 = Moderate, 4 = Severe, 5 = Vomiting  Intensity  1. Sitting to supine   2. Supine to L side   3. Supine to R side   4. Supine to sitting   5. L Hallpike-Dix   6. Up from L    7. R Hallpike-Dix   8. Up from R    9. Sitting, head tipped to L knee 2  10. Head up from L knee 2  11. Sitting, head tipped to R knee 2  12. Head up from R knee 2  13. Sitting head turns x5 3  14.Sitting head nods x5 3  15. In stance, 180 turn to L  0, has to have a wider BOS  16. In stance, 180 turn to R 0, has to have a wider BOS      M-CTSIB  Condition 1: Firm Surface, EO 30 Sec, Normal Sway  Condition 2: Firm Surface, EC 30 Sec, Mod Sway  Condition 3: Foam Surface, EO 30 Sec, Mod Sway  Condition 4: Foam Surface, EC 8.4 Sec      VESTIBULAR TREATMENT:                                                                                                   N/A during eval.   PATIENT EDUCATION: Education details: Clinical  findings, POC, areas to address in therapy. Pt to also follow up with PCP regarding orthostatics  Person educated: Patient Education method: Explanation Education comprehension: verbalized understanding  HOME EXERCISE PROGRAM: Will provide at future session  GOALS: Goals reviewed with patient? Yes  SHORT TERM  GOALS: ALL STGS = LTGS  LONG TERM GOALS: Target date: 08/25/2022  Pt will be independent with final HEP for vestibular related deficits/balance in order to build upon functional gains made in therapy. Baseline:  Goal status: INITIAL  2.  Pt will be able to hold condition 4 of mCTSIB for at least 15 seconds in order to demo improved vestibular input for balance.  Baseline: 8.4 seconds  Goal status: INITIAL  3.  Pt will report items on MSQ as 1/5 or less in order to demo improved motion sensitivity/dizziness.  Baseline:  Goal status: INITIAL  4.  Pt will improve DVA to 2 line difference or less with no dizziness in order to demo improved gaze stabilization.  Baseline:  Goal status: INITIAL   ASSESSMENT:  CLINICAL IMPRESSION: Patient is a 57 year old female referred to Neuro OPPT for neuropathy/dizziness.   Pt's PMH is significant for: hypertension, DM2, hyperlipidemia, breast cancer, OSA on CPAP, cervical spondylosis with myelopathy and radiculopathy, ADD , osteopenia. The following deficits were present during the exam: impaired balance, dizziness, gait abnormality, pt with positive HIT to the L and 3 line difference on DVA indicating impaired VOR. Based on mCTSIB pt with significant decr vestibular input and pt primarily reliant on her vision for balance. Pt did not have orthostatics during today's session, but plans to follow up with her PCP/cardiologist regarding this. Pt would benefit from skilled PT to address these impairments and functional limitations to maximize functional mobility independence and decr dizziness/risk of future falls.    OBJECTIVE IMPAIRMENTS:  Abnormal gait, decreased activity tolerance, decreased balance, difficulty walking, dizziness, and impaired sensation.   ACTIVITY LIMITATIONS: bending, transfers, bed mobility, and locomotion level  PARTICIPATION LIMITATIONS: driving and community activity  PERSONAL FACTORS: Age, Behavior pattern, Past/current experiences, Time since onset of injury/illness/exacerbation, 3+ comorbidities: finances, and hypertension, DM2, hyperlipidemia, breast cancer, OSA on CPAP, cervical spondylosis with myelopathy and radiculopathy, ADD , osteopenia   are also affecting patient's functional outcome.   REHAB POTENTIAL: Good  CLINICAL DECISION MAKING: Evolving/moderate complexity  EVALUATION COMPLEXITY: Moderate   PLAN:  PT FREQUENCY: 1x/week - due to finances   PT DURATION: 8 weeks  PLANNED INTERVENTIONS: Therapeutic exercises, Therapeutic activity, Neuromuscular re-education, Balance training, Gait training, Patient/Family education, Self Care, Vestibular training, Canalith repositioning, DME instructions, and Re-evaluation  PLAN FOR NEXT SESSION: finish MSQ testing. Initiate HEP for balance - EC, unlevel surfaces, VOR exercises, possibly trial cane for stability?    Drake Leach, PT, DTaP  07/28/2022, 2:12 PM

## 2022-08-07 ENCOUNTER — Telehealth: Payer: Self-pay | Admitting: Neurology

## 2022-08-07 NOTE — Telephone Encounter (Signed)
Patient is calling for adivce on the nerve biopsy that is tomorrow. Patient is alittle concerned /KB

## 2022-08-08 ENCOUNTER — Ambulatory Visit (INDEPENDENT_AMBULATORY_CARE_PROVIDER_SITE_OTHER): Payer: Managed Care, Other (non HMO) | Admitting: Neurology

## 2022-08-08 ENCOUNTER — Encounter: Payer: Self-pay | Admitting: Neurology

## 2022-08-08 VITALS — BP 136/87 | HR 81 | Ht 61.0 in | Wt 142.2 lb

## 2022-08-08 DIAGNOSIS — R202 Paresthesia of skin: Secondary | ICD-10-CM | POA: Diagnosis not present

## 2022-08-08 DIAGNOSIS — G629 Polyneuropathy, unspecified: Secondary | ICD-10-CM

## 2022-08-08 NOTE — Progress Notes (Signed)
Punch Biopsy Procedure Note  Preprocedure Diagnosis: paresthesia of skin   Postprocedure Diagnosis: same  Locations: Site 1: left lateral distal leg;  Site 2: left lateral thigh  Indications: r/o small fiber neuropathy  Anesthesia: 5 mL Lidocaine 1% with epinephrine  Procedure Details Patient informed of the risks (including but not limited to bleeding, pain, infection, scar and infection) and benefits of the procedure.  Informed consent obtained.  The areas which were chosen for biopsy, as above, and surrounding areas were given a sterile prep using alcohol and iodine. The skin was then stretched perpendicular to the skin tension lines and sample removed using the 3 mm punch. Pressure applied, hemostasis achieved.   Dressing applied. The specimen(s) was sent for pathologic examination. The patient tolerated the procedure well.  Estimated Blood Loss: 3 ml  Condition: Stable  Complications: none.  Plan: 1. Instructed to keep the wound dry and covered for 24h and clean thereafter. 2. Warning signs of infection were reviewed.    Jacquelyne Balint, MD [facility]

## 2022-08-08 NOTE — Telephone Encounter (Signed)
Pt in office 08/08/22 for biopsy

## 2022-08-11 ENCOUNTER — Encounter: Payer: Self-pay | Admitting: Physical Therapy

## 2022-08-11 ENCOUNTER — Ambulatory Visit: Payer: Managed Care, Other (non HMO) | Attending: Neurology | Admitting: Physical Therapy

## 2022-08-11 VITALS — BP 123/91 | HR 75

## 2022-08-11 DIAGNOSIS — R42 Dizziness and giddiness: Secondary | ICD-10-CM | POA: Diagnosis present

## 2022-08-11 DIAGNOSIS — R2681 Unsteadiness on feet: Secondary | ICD-10-CM | POA: Insufficient documentation

## 2022-08-11 DIAGNOSIS — R2689 Other abnormalities of gait and mobility: Secondary | ICD-10-CM | POA: Diagnosis present

## 2022-08-11 NOTE — Therapy (Signed)
OUTPATIENT PHYSICAL THERAPY VESTIBULAR TREATMENT   Patient Name: Lindsay Rios MRN: [id number] DOB:September 24, 1965, 57 y.o., female Today's Date: 08/11/2022  END OF SESSION:  PT End of Session - 08/11/22 1326     Visit Number 2    Number of Visits 5    Date for PT Re-Evaluation 09/26/22    Authorization Type [employer] - $50 co pay    PT Start Time 1235    PT Stop Time 1333    PT Time Calculation (min) 58 min    Equipment Utilized During Treatment Gait belt    Activity Tolerance Patient tolerated treatment well    Behavior During Therapy WFL for tasks assessed/performed              Past Medical History:  Diagnosis Date   ADD (attention deficit disorder)    Allergy    Depression    Ejection fraction    Gestational diabetes 1999; 2002   History of chicken pox    Human papilloma virus    High Risk    Hyperlipidemia    Hypertension    Numbness and tingling    Osteopenia    Pneumonia 2012   Sleep apnea with use of continuous positive airway pressure (CPAP)    Type II diabetes mellitus (HCC)    "borderline; I'm on Metformin"   Past Surgical History:  Procedure Laterality Date   ANAL RECTAL MANOMETRY N/A 03/17/2015   Procedure: ANO RECTAL MANOMETRY;  Surgeon: Napoleon Form, MD;  Location: [facility];  Service: Endoscopy;  Laterality: N/A;   BREAST LUMPECTOMY WITH RADIOACTIVE SEED LOCALIZATION Right 06/05/2019   Procedure: [name];  Surgeon: Abigail Miyamoto, MD;  Location: [facility];  Service: General;  Laterality: Right;   BREAST RECONSTRUCTION WITH PLACEMENT OF TISSUE EXPANDER AND FLEX HD (ACELLULAR HYDRATED DERMIS) Bilateral 04/19/2020   Procedure: IMMEDIATE BILATERAL BREAST RECONSTRUCTION WITH PLACEMENT OF TISSUE EXPANDER AND FLEX HD (ACELLULAR HYDRATED DERMIS);  Surgeon: Peggye Form, DO;  Location: [facility];  Service: Plastics;  Laterality: Bilateral;   LEFT HEART  CATHETERIZATION WITH CORONARY ANGIOGRAM N/A 12/22/2013   Procedure: LEFT HEART CATHETERIZATION WITH CORONARY ANGIOGRAM;  Surgeon: Corky Crafts, MD;  Location: [facility];  Service: Cardiovascular;  Laterality: N/A;   NIPPLE SPARING MASTECTOMY Bilateral 04/19/2020   Procedure: BILATERAL NIPPLE SPARING MASTECTOMY;  Surgeon: Abigail Miyamoto, MD;  Location: [facility];  Service: General;  Laterality: Bilateral;   REMOVAL OF TISSUE EXPANDER AND PLACEMENT OF IMPLANT Bilateral 08/12/2020   Procedure: REMOVAL OF TISSUE EXPANDER AND PLACEMENT OF IMPLANT;  Surgeon: Peggye Form, DO;  Location:  SURGERY CENTER;  Service: Plastics;  Laterality: Bilateral;  90 min   SHOULDER ARTHROSCOPY W/ ROTATOR CUFF REPAIR Right 1990's   VAGINAL HYSTERECTOMY  2013   HPV/Cervical Dysplasia; partial   Patient Active Problem List   Diagnosis Date Noted   Dizziness 02/13/2022   Incontinence of feces 11/02/2021   Right cervical radiculopathy 09/12/2021   Gait abnormality 08/25/2021   History of mastectomy 06/14/2021   Benign paroxysmal positional vertigo of left ear 04/26/2021   Poorly controlled diabetes mellitus (HCC) 04/26/2021   Acquired absence of breast 04/27/2020   Breast cancer (HCC) 04/19/2020   Atypical ductal hyperplasia of right breast 07/03/2019   Osteopenia 03/20/2019   Irritable bowel syndrome 03/18/2019   Chest pain radiating to jaw 04/12/2016   OSA on CPAP 04/12/2016   Family history of early CAD  04/12/2016   Vitamin D deficiency 06/07/2015   Ulnar neuropathy at elbow of left upper extremity 05/25/2015   Constipation    Spondylosis, cervical, with myelopathy 01/11/2015   HTN (hypertension) 09/09/2014   Plantar fasciitis, bilateral 09/09/2014   HLD (hyperlipidemia) 02/22/2014   DM type 2 (diabetes mellitus, type 2) (HCC) 02/09/2014   Depression 06/14/2013   Stress and adjustment reaction 06/14/2013   Attention or concentration deficit 06/02/2012    Allergic rhinitis 09/25/2006    PCP: Milus Height, PA  REFERRING PROVIDER: Van Clines, MD  REFERRING DIAG: R42 (ICD-10-CM) - Dizziness R26.81 (ICD-10-CM) - Gait instability G62.9 (ICD-10-CM) - Neuropathy  THERAPY DIAG:  Unsteadiness on feet  Other abnormalities of gait and mobility  Dizziness and giddiness  ONSET DATE: 07/12/2022  Rationale for Evaluation and Treatment: Rehabilitation  SUBJECTIVE:   SUBJECTIVE STATEMENT: Patient goes by "Lindsay Rios."   Patient denies any falls but reports around 5-10 falls since last here. Patient reports that her dizziness at start of session 0/10 at rest and 8-9/10 when moving fast.  Pt accompanied by: self  PERTINENT HISTORY: PMH: hypertension, DM2, hyperlipidemia, breast cancer, OSA on CPAP, cervical spondylosis with myelopathy and radiculopathy, ADD , osteopenia   Per Dr. Karel Jarvis: Vestibular testing July 2023 at the [facility] showed no evidence of peripheral vestibular deficit, no evidence of benign positional vertigo,  Orthostatic blood pressure change, tachycardia: Most consistent with her deconditioning, lack of regular sleep schedule, worsening depression, with a background of diabetic small fiber neuropathy, Constant feeling of dizziness even when sitting or lying down, worse when she stands up   PAIN:  Are you having pain? No  Vitals:   08/11/22 1242  BP: (!) 123/91  Pulse: 75    Sitting, Standing Reporting no lightheadedness   PRECAUTIONS: Fall  WEIGHT BEARING RESTRICTIONS: No  FALLS: Has patient fallen in last 6 months? Yes. Number of falls 6  LIVING ENVIRONMENT: Lives with: lives with their spouse Lives in: House/apartment Stairs: Has stairs to go into basement, but does not go down there  Has following equipment at home: None  PLOF: Independent If feeling dizzy, won't go anywhere   PATIENT GOALS: wants to quit getting so dizzy, stop falling.   OBJECTIVE:   DIAGNOSTIC FINDINGS: Neurosurgery  evaluation by Dr Conchita Paris October 12, 2021, C5-6, broad-based disc osteophyte complex with right greater than left neuroforaminal stenosis mild spinal stenosis, not a surgical candidate, epidural injection planned  EMG nerve conduction study in October 2023 showed no large fiber peripheral neuropathy  CT cervical spine 01/2022: Degenerative disc disease is greatest at C5-6 and C6-7. 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   Otherwise unremarkable non-contrast MRI appearance of the brain.   COGNITION: Overall cognitive status: Within functional limits for tasks assessed   MOTION SENSITIVITY:  Motion Sensitivity Quotient Intensity: 0 = none, 1 = Lightheaded, 2 = Mild, 3 = Moderate, 4 = Severe, 5 = Vomiting  Intensity  1. Sitting to supine 0  2. Supine to L side 2  3. Supine to R side 0  4. Supine to sitting 1  5. L Hallpike-Dix - modified sidelying due to neck 2  6. Up from L - modified sidelying due to neck  3  7. R Hallpike-Dix - modified sidelying due to neck 0  8. Up from R  - modified sidelying due  to neck 3  9. Sitting, head tipped to L knee 2  10. Head up from L knee 2  11. Sitting, head tipped to R knee 2  12. Head up from R knee 2  13. Sitting head turns x5 3  14.Sitting head nods x5 3  15. In stance, 180 turn to L  0, has to have a wider BOS  16. In stance, 180 turn to R 0, has to have a wider BOS      VESTIBULAR TREATMENT:                                                                                                    TherAct: Finished MSQ assessment as noted above. Bolded tested in today's session. Briefly discussed trial of SPC and patient would prefer to avoid at this time.   VESTIBULAR TREATMENT:  Positional Testing: R Modified Posterior Canal Testing in Sidelying:  Negative, no nystagmus L Modified Posterior Canal Testing in Sidelying: Negative, no nystagmus L Roll Test: negative, reports mild dizziness with head turns, no nystagmus  R Roll Test: negative, no nystagmus   Gaze Adaptation: x1 Viewing Horizontal: Position: seated with plain background, Time: 30 seconds, Reps: 2, and Comment: reports mild dizziness/difficulty keeping x in focused, improved when slowed down and x1 Viewing Vertical:  Position: seated with plain background , Time: 30 seconds, Reps: 2, and Comment: less dizzy than side to side  Established Initial HEP: - Corner Balance Standing on Foam Pad  - 30 seconds hold - Corner Balance Feet Together: Eyes Closed With Head Turns  - 2 sets - 10 reps - Discussed safety measures with setup and playing around with feet width to progress safely; patient verbalized understanding   PATIENT EDUCATION: Education details: POC + examination findings  Person educated: Patient Education method: Explanation Education comprehension: verbalized understanding  HOME EXERCISE PROGRAM: Access Code: [email or web address] URL: [email or web address] Date: 08/11/2022 Prepared by: Maryruth Eve  Exercises - Seated Gaze Stabilization with Head Rotation  - 1 x daily - 7 x weekly - 6 sets - 30 seconds hold - Seated Gaze Stabilization with Head Nod  - 1 x daily - 7 x weekly - 6 sets - 30 seconds hold - Standing on Foam Pad  - 1 x daily - 5 x weekly - 3 sets - 30 seconds hold - Corner Balance Feet Together: Eyes Closed With Head Turns  - 1 x daily - 5 x weekly - 2 sets - 10 reps  GOALS: Goals reviewed with patient? Yes  SHORT TERM GOALS: ALL STGS = LTGS  LONG TERM GOALS: Target date: 08/25/2022  Pt will be independent with final HEP for vestibular related deficits/balance in order to build upon functional gains made in therapy. Baseline:  Goal status: INITIAL  2.  Pt will be able to hold condition 4 of mCTSIB for at least 15 seconds in order to demo  improved vestibular input for balance.  Baseline: 8.4 seconds  Goal status: INITIAL  3.  Pt will report items on MSQ as 1/5 or less in order to demo improved motion sensitivity/dizziness.  Baseline:  Goal status: INITIAL  4.  Pt will improve DVA to 2 line difference or less with no dizziness in order to demo improved gaze stabilization.  Baseline:  Goal status: INITIAL  ASSESSMENT:  CLINICAL IMPRESSION: Session emphasized further vestibular assessment not covered on eval in addition to creation of intial HEP. Patient presents with mild to moderate levels of motion sensitivity with MSQ. Patient is not wanting to try AD device at this time but open to keeping it in "back of mind." Pt would benefit from skilled PT to address ongoing balance and dizziness impairments as well as functional limitations to maximize functional mobility independence.   OBJECTIVE IMPAIRMENTS: Abnormal gait, decreased activity tolerance, decreased balance, difficulty walking, dizziness, and impaired sensation.   ACTIVITY LIMITATIONS: bending, transfers, bed mobility, and locomotion level  PARTICIPATION LIMITATIONS: driving and community activity  PERSONAL FACTORS: Age, Behavior pattern, Past/current experiences, Time since onset of injury/illness/exacerbation, 3+ comorbidities: finances, and hypertension, DM2, hyperlipidemia, breast cancer, OSA on CPAP, cervical spondylosis with myelopathy and radiculopathy, ADD , osteopenia   are also affecting patient's functional outcome.   REHAB POTENTIAL: Good  CLINICAL DECISION MAKING: Evolving/moderate complexity  EVALUATION COMPLEXITY: Moderate   PLAN:  PT FREQUENCY: 1x/week - due to finances   PT DURATION: 8 weeks  PLANNED INTERVENTIONS: Therapeutic exercises, Therapeutic activity, Neuromuscular re-education, Balance training, Gait training, Patient/Family education, Self Care, Vestibular training, Canalith repositioning, DME instructions, and  Re-evaluation  PLAN FOR NEXT SESSION: How is HEP for balance going - EC, unlevel surfaces, VOR exercise progression  Carmelia Bake, PT, DPT 08/11/2022, 3:18 PM

## 2022-08-18 ENCOUNTER — Telehealth: Payer: Self-pay | Admitting: Physical Therapy

## 2022-08-18 ENCOUNTER — Ambulatory Visit: Payer: Managed Care, Other (non HMO) | Admitting: Physical Therapy

## 2022-08-18 NOTE — Telephone Encounter (Signed)
Called patient. Patient reports that she tried to cancel appointment (may not have gone through as epic system down). Plans on being at appointment next week.   Maryruth Eve, PT, DPT

## 2022-08-22 ENCOUNTER — Telehealth: Payer: Self-pay | Admitting: Neurology

## 2022-08-22 NOTE — Telephone Encounter (Signed)
Patient is about skin biopsy/KB

## 2022-08-23 NOTE — Telephone Encounter (Signed)
Please let pt know skin biopsy results did show evidence of small fiber neuropathy.

## 2022-08-23 NOTE — Telephone Encounter (Signed)
Called patient and informed her of results. Patient stated she spoke to someone yesterday and they informed her of results and to drink plenty water and wear compression socks. I informed patient that she has a follow up with Dr. Karel Jarvis in September and Dr. Karel Jarvis can answer her questions. Patient is aware to call us if she needs [place] or if symptoms worsen. Patient had no further questions or concerns.

## 2022-08-25 ENCOUNTER — Encounter: Payer: Self-pay | Admitting: Physical Therapy

## 2022-08-25 ENCOUNTER — Ambulatory Visit: Payer: Managed Care, Other (non HMO) | Admitting: Physical Therapy

## 2022-08-25 DIAGNOSIS — R2681 Unsteadiness on feet: Secondary | ICD-10-CM

## 2022-08-25 DIAGNOSIS — R42 Dizziness and giddiness: Secondary | ICD-10-CM

## 2022-08-25 DIAGNOSIS — R2689 Other abnormalities of gait and mobility: Secondary | ICD-10-CM

## 2022-08-25 NOTE — Therapy (Signed)
OUTPATIENT PHYSICAL THERAPY VESTIBULAR TREATMENT   Patient Name: Lindsay Rios MRN: [id number] DOB:Mar 07, 1965, 57 y.o., female Today's Date: 08/25/2022  END OF SESSION:  PT End of Session - 08/25/22 1233     Visit Number 3    Number of Visits 5    Date for PT Re-Evaluation 09/26/22    Authorization Type [employer] - $50 co pay    PT Start Time 1231    PT Stop Time 1312    PT Time Calculation (min) 41 min    Equipment Utilized During Treatment Gait belt    Activity Tolerance Patient tolerated treatment well    Behavior During Therapy WFL for tasks assessed/performed              Past Medical History:  Diagnosis Date   ADD (attention deficit disorder)    Allergy    Depression    Ejection fraction    Gestational diabetes 1999; 2002   History of chicken pox    Human papilloma virus    High Risk    Hyperlipidemia    Hypertension    Numbness and tingling    Osteopenia    Pneumonia 2012   Sleep apnea with use of continuous positive airway pressure (CPAP)    Type II diabetes mellitus (HCC)    "borderline; I'm on Metformin"   Past Surgical History:  Procedure Laterality Date   ANAL RECTAL MANOMETRY N/A 03/17/2015   Procedure: ANO RECTAL MANOMETRY;  Surgeon: Lindsay Form, MD;  Location: [facility];  Service: Endoscopy;  Laterality: N/A;   BREAST LUMPECTOMY WITH RADIOACTIVE SEED LOCALIZATION Right 06/05/2019   Procedure: [name];  Surgeon: Lindsay Miyamoto, MD;  Location: [facility];  Service: General;  Laterality: Right;   BREAST RECONSTRUCTION WITH PLACEMENT OF TISSUE EXPANDER AND FLEX HD (ACELLULAR HYDRATED DERMIS) Bilateral 04/19/2020   Procedure: IMMEDIATE BILATERAL BREAST RECONSTRUCTION WITH PLACEMENT OF TISSUE EXPANDER AND FLEX HD (ACELLULAR HYDRATED DERMIS);  Surgeon: Lindsay Form, DO;  Location: [facility];  Service: Plastics;  Laterality: Bilateral;   LEFT HEART  CATHETERIZATION WITH CORONARY ANGIOGRAM N/A 12/22/2013   Procedure: LEFT HEART CATHETERIZATION WITH CORONARY ANGIOGRAM;  Surgeon: Lindsay Crafts, MD;  Location: [facility] CATH LAB;  Service: Cardiovascular;  Laterality: N/A;   NIPPLE SPARING MASTECTOMY Bilateral 04/19/2020   Procedure: BILATERAL NIPPLE SPARING MASTECTOMY;  Surgeon: Lindsay Miyamoto, MD;  Location: [facility];  Service: General;  Laterality: Bilateral;   REMOVAL OF TISSUE EXPANDER AND PLACEMENT OF IMPLANT Bilateral 08/12/2020   Procedure: REMOVAL OF TISSUE EXPANDER AND PLACEMENT OF IMPLANT;  Surgeon: Lindsay Form, DO;  Location: [facility];  Service: Plastics;  Laterality: Bilateral;  90 min   SHOULDER ARTHROSCOPY W/ ROTATOR CUFF REPAIR Right 1990's   VAGINAL HYSTERECTOMY  2013   HPV/Cervical Dysplasia; partial   Patient Active Problem List   Diagnosis Date Noted   Dizziness 02/13/2022   Incontinence of feces 11/02/2021   Right cervical radiculopathy 09/12/2021   Gait abnormality 08/25/2021   History of mastectomy 06/14/2021   Benign paroxysmal positional vertigo of left ear 04/26/2021   Poorly controlled diabetes mellitus (HCC) 04/26/2021   Acquired absence of breast 04/27/2020   Breast cancer (HCC) 04/19/2020   Atypical ductal hyperplasia of right breast 07/03/2019   Osteopenia 03/20/2019   Irritable bowel syndrome 03/18/2019   Chest pain radiating to jaw 04/12/2016   OSA on CPAP 04/12/2016   Family history of early CAD  04/12/2016   Vitamin D deficiency 06/07/2015   Ulnar neuropathy at elbow of left upper extremity 05/25/2015   Constipation    Spondylosis, cervical, with myelopathy 01/11/2015   HTN (hypertension) 09/09/2014   Plantar fasciitis, bilateral 09/09/2014   HLD (hyperlipidemia) 02/22/2014   DM type 2 (diabetes mellitus, type 2) (HCC) 02/09/2014   Depression 06/14/2013   Stress and adjustment reaction 06/14/2013   Attention or concentration deficit 06/02/2012    Allergic rhinitis 09/25/2006    PCP: Lindsay Height, PA  REFERRING PROVIDER: Van Clines, MD  REFERRING DIAG: R42 (ICD-10-CM) - Dizziness R26.81 (ICD-10-CM) - Gait instability G62.9 (ICD-10-CM) - Neuropathy  THERAPY DIAG:  Unsteadiness on feet  Other abnormalities of gait and mobility  Dizziness and giddiness  ONSET DATE: 07/12/2022  Rationale for Evaluation and Treatment: Rehabilitation  SUBJECTIVE:   SUBJECTIVE STATEMENT: Patient goes by "Lindsay Rios."   No falls. Needs to hold on when she is walking. Has to take her time when getting up. Got the results from Dr. Loleta Rios and was found to have small nerve fiber neuropathy.   Pt accompanied by: self  PERTINENT HISTORY: PMH: hypertension, DM2, hyperlipidemia, breast cancer, OSA on CPAP, cervical spondylosis with myelopathy and radiculopathy, ADD , osteopenia   Per Dr. Karel Rios: Vestibular testing July 2023 at the [facility] showed no evidence of peripheral vestibular deficit, no evidence of benign positional vertigo,  Orthostatic blood pressure change, tachycardia: Most consistent with her deconditioning, lack of regular sleep schedule, worsening depression, with a background of diabetic small fiber neuropathy, Constant feeling of dizziness even when sitting or lying down, worse when she stands up   PAIN:  Are you having pain? No  There were no vitals filed for this visit.   Sitting, Standing Reporting no lightheadedness   PRECAUTIONS: Fall  WEIGHT BEARING RESTRICTIONS: No  FALLS: Has patient fallen in last 6 months? Yes. Number of falls 6  LIVING ENVIRONMENT: Lives with: lives with their spouse Lives in: House/apartment Stairs: Has stairs to go into basement, but does not go down there  Has following equipment at home: None  PLOF: Independent If feeling dizzy, won't go anywhere   PATIENT GOALS: wants to quit getting so dizzy, stop falling.   OBJECTIVE:   DIAGNOSTIC FINDINGS: Neurosurgery evaluation by  Dr Lindsay Rios October 12, 2021, C5-6, broad-based disc osteophyte complex with right greater than left neuroforaminal stenosis mild spinal stenosis, not a surgical candidate, epidural injection planned  EMG nerve conduction study in October 2023 showed no large fiber peripheral neuropathy  CT cervical spine 01/2022: Degenerative disc disease is greatest at C5-6 and C6-7. 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   Otherwise unremarkable non-contrast MRI appearance of the brain.   COGNITION: Overall cognitive status: Within functional limits for tasks assessed   MOTION SENSITIVITY:  Motion Sensitivity Quotient Intensity: 0 = none, 1 = Lightheaded, 2 = Mild, 3 = Moderate, 4 = Severe, 5 = Vomiting  Intensity  1. Sitting to supine 0  2. Supine to L side 2  3. Supine to R side 0  4. Supine to sitting 1  5. L Hallpike-Dix - modified sidelying due to neck 2  6. Up from L - modified sidelying due to neck  3  7. R Hallpike-Dix - modified sidelying due to neck 0  8. Up from R  - modified sidelying due  to neck 3  9. Sitting, head tipped to L knee 2  10. Head up from L knee 2  11. Sitting, head tipped to R knee 2  12. Head up from R knee 2  13. Sitting head turns x5 3  14.Sitting head nods x5 3  15. In stance, 180 turn to L  0, has to have a wider BOS  16. In stance, 180 turn to R 0, has to have a wider BOS      VESTIBULAR TREATMENT:                                                                                                     Gaze Adaptation: x1 Viewing Horizontal: Position: Standing, Time: 30 seconds, Reps: 2, and Comment: Mild wooziness  x1 Viewing Vertical:  Position: Standing, Time: 30 seconds, Reps: 2, and Comment: no symptoms, pt reporting getting a little more thrown off when looking down    EC sit <> stands on level ground: 2 x 5 reps, cued for incr forward lean as pt with BLE bracing against mat at times for balance. Performs with wider BOS  On rockerboard in A/P direction: 15 reps weight shifting working on hip/ankle strategy EO 2 x 10 reps head turns, 2 x 10 reps head nods - intermittent taps for balance On level ground: tandem stance with EO 2 x 30 seconds bilat Gait x115' with head turns and head nods, unsteadiness noted, but pt able to keep balance In corner on air ex: Marching EO x10 reps, marching EO 2 x 10 reps head turns with incr unsteadiness and taps to wall for balance  EC wide BOS > feet hip width and slightly more narrow 4 x 30 seconds   PATIENT EDUCATION: Education details: Scheduling appt that pt missed, purpose of vestibular exercises add home, progressing VOR to standing, tandem additions to HEP  Person educated: Patient Education method: Explanation, Demonstration, Verbal cues, and Handouts Education comprehension: verbalized understanding and returned demonstration  HOME EXERCISE PROGRAM: Access Code: [email or web address] URL: [email or web address] Date: 08/11/2022 Prepared by: Maryruth Eve  Exercises -Standing VOR x1 for 30 seconds - Standing on Foam Pad  - 1 x daily - 5 x weekly - 3 sets - 30 seconds hold - Corner Balance Feet Together: Eyes Closed With Head Turns  - 1 x daily - 5 x weekly - 2 sets - 10 reps - Tandem Stance  - 2 x daily - 5 x weekly - 3 sets - 30 hold - Tandem Walking with Counter Support  - 2 x daily - 5 x weekly - 3 sets  GOALS: Goals reviewed with patient? Yes  SHORT TERM GOALS: ALL STGS = LTGS  LONG TERM GOALS: Target date: 08/25/2022  Pt will be independent with final HEP for vestibular related deficits/balance in order to build upon functional gains made in therapy. Baseline:  Goal status: INITIAL  2.  Pt will be able to hold condition 4 of mCTSIB for at least 15 seconds in order to demo improved vestibular input  for balance.  Baseline: 8.4 seconds  Goal status:  INITIAL  3.  Pt will report items on MSQ as 1/5 or less in order to demo improved motion sensitivity/dizziness.  Baseline:  Goal status: INITIAL  4.  Pt will improve DVA to 2 line difference or less with no dizziness in order to demo improved gaze stabilization.  Baseline:  Goal status: INITIAL  ASSESSMENT:  CLINICAL IMPRESSION: Today's skilled session focused on balance tasks for incr vestibular input and narrow BOS tasks. Pt reporting no dizziness during session, just feeling off balance. Pt able to progress VOR to standing for 30 seconds with slight wooziness. Added tandem and narrow BOS tasks to HEP. With EC tasks, pt initially more unsteady, but does demo improvement with incr reps. Will continue to progress towards LTGs.   OBJECTIVE IMPAIRMENTS: Abnormal gait, decreased activity tolerance, decreased balance, difficulty walking, dizziness, and impaired sensation.   ACTIVITY LIMITATIONS: bending, transfers, bed mobility, and locomotion level  PARTICIPATION LIMITATIONS: driving and community activity  PERSONAL FACTORS: Age, Behavior pattern, Past/current experiences, Time since onset of injury/illness/exacerbation, 3+ comorbidities: finances, and hypertension, DM2, hyperlipidemia, breast cancer, OSA on CPAP, cervical spondylosis with myelopathy and radiculopathy, ADD , osteopenia   are also affecting patient's functional outcome.   REHAB POTENTIAL: Good  CLINICAL DECISION MAKING: Evolving/moderate complexity  EVALUATION COMPLEXITY: Moderate   PLAN:  PT FREQUENCY: 1x/week - due to finances   PT DURATION: 8 weeks  PLANNED INTERVENTIONS: Therapeutic exercises, Therapeutic activity, Neuromuscular re-education, Balance training, Gait training, Patient/Family education, Self Care, Vestibular training, Canalith repositioning, DME instructions, and Re-evaluation  PLAN FOR NEXT SESSION: How is HEP for balance going - EC, unlevel  surfaces, VOR exercise progression. Need to check goal and update date   Drake Leach, PT, DPT 08/25/2022, 1:13 PM

## 2022-09-01 ENCOUNTER — Encounter: Payer: Self-pay | Admitting: Physical Therapy

## 2022-09-01 ENCOUNTER — Ambulatory Visit: Payer: Managed Care, Other (non HMO) | Attending: Neurology | Admitting: Physical Therapy

## 2022-09-01 DIAGNOSIS — R2689 Other abnormalities of gait and mobility: Secondary | ICD-10-CM | POA: Diagnosis present

## 2022-09-01 DIAGNOSIS — R42 Dizziness and giddiness: Secondary | ICD-10-CM | POA: Insufficient documentation

## 2022-09-01 DIAGNOSIS — R2681 Unsteadiness on feet: Secondary | ICD-10-CM | POA: Insufficient documentation

## 2022-09-01 NOTE — Therapy (Signed)
OUTPATIENT PHYSICAL THERAPY VESTIBULAR TREATMENT   Patient Name: Lindsay Rios MRN: [id number] DOB:1965/10/26, 57 y.o., female Today's Date: 09/01/2022  END OF SESSION:  PT End of Session - 09/01/22 1233     Visit Number 4    Number of Visits 5    Date for PT Re-Evaluation 09/26/22    Authorization Type [employer] - $50 co pay    PT Start Time 1231    PT Stop Time 1313    PT Time Calculation (min) 42 min    Equipment Utilized During Treatment Gait belt    Activity Tolerance Patient tolerated treatment well    Behavior During Therapy WFL for tasks assessed/performed               Past Medical History:  Diagnosis Date   ADD (attention deficit disorder)    Allergy    Depression    Ejection fraction    Gestational diabetes 1999; 2002   History of chicken pox    Human papilloma virus    High Risk    Hyperlipidemia    Hypertension    Numbness and tingling    Osteopenia    Pneumonia 2012   Sleep apnea with use of continuous positive airway pressure (CPAP)    Type II diabetes mellitus (HCC)    "borderline; I'm on Metformin"   Past Surgical History:  Procedure Laterality Date   ANAL RECTAL MANOMETRY N/A 03/17/2015   Procedure: ANO RECTAL MANOMETRY;  Surgeon: Napoleon Form, MD;  Location: [facility];  Service: Endoscopy;  Laterality: N/A;   BREAST LUMPECTOMY WITH RADIOACTIVE SEED LOCALIZATION Right 06/05/2019   Procedure: [name];  Surgeon: Abigail Miyamoto, MD;  Location: [facility];  Service: General;  Laterality: Right;   BREAST RECONSTRUCTION WITH PLACEMENT OF TISSUE EXPANDER AND FLEX HD (ACELLULAR HYDRATED DERMIS) Bilateral 04/19/2020   Procedure: IMMEDIATE BILATERAL BREAST RECONSTRUCTION WITH PLACEMENT OF TISSUE EXPANDER AND FLEX HD (ACELLULAR HYDRATED DERMIS);  Surgeon: Peggye Form, DO;  Location: [facility];  Service: Plastics;  Laterality: Bilateral;   LEFT HEART  CATHETERIZATION WITH CORONARY ANGIOGRAM N/A 12/22/2013   Procedure: LEFT HEART CATHETERIZATION WITH CORONARY ANGIOGRAM;  Surgeon: Corky Crafts, MD;  Location: [facility] CATH LAB;  Service: Cardiovascular;  Laterality: N/A;   NIPPLE SPARING MASTECTOMY Bilateral 04/19/2020   Procedure: BILATERAL NIPPLE SPARING MASTECTOMY;  Surgeon: Abigail Miyamoto, MD;  Location: [facility];  Service: General;  Laterality: Bilateral;   REMOVAL OF TISSUE EXPANDER AND PLACEMENT OF IMPLANT Bilateral 08/12/2020   Procedure: REMOVAL OF TISSUE EXPANDER AND PLACEMENT OF IMPLANT;  Surgeon: Peggye Form, DO;  Location: [facility];  Service: Plastics;  Laterality: Bilateral;  90 min   SHOULDER ARTHROSCOPY W/ ROTATOR CUFF REPAIR Right 1990's   VAGINAL HYSTERECTOMY  2013   HPV/Cervical Dysplasia; partial   Patient Active Problem List   Diagnosis Date Noted   Dizziness 02/13/2022   Incontinence of feces 11/02/2021   Right cervical radiculopathy 09/12/2021   Gait abnormality 08/25/2021   History of mastectomy 06/14/2021   Benign paroxysmal positional vertigo of left ear 04/26/2021   Poorly controlled diabetes mellitus (HCC) 04/26/2021   Acquired absence of breast 04/27/2020   Breast cancer (HCC) 04/19/2020   Atypical ductal hyperplasia of right breast 07/03/2019   Osteopenia 03/20/2019   Irritable bowel syndrome 03/18/2019   Chest pain radiating to jaw 04/12/2016   OSA on CPAP 04/12/2016   Family history of early  CAD 04/12/2016   Vitamin D deficiency 06/07/2015   Ulnar neuropathy at elbow of left upper extremity 05/25/2015   Constipation    Spondylosis, cervical, with myelopathy 01/11/2015   HTN (hypertension) 09/09/2014   Plantar fasciitis, bilateral 09/09/2014   HLD (hyperlipidemia) 02/22/2014   DM type 2 (diabetes mellitus, type 2) (HCC) 02/09/2014   Depression 06/14/2013   Stress and adjustment reaction 06/14/2013   Attention or concentration deficit 06/02/2012    Allergic rhinitis 09/25/2006    PCP: Milus Height, PA  REFERRING PROVIDER: Van Clines, MD  REFERRING DIAG: R42 (ICD-10-CM) - Dizziness R26.81 (ICD-10-CM) - Gait instability G62.9 (ICD-10-CM) - Neuropathy  THERAPY DIAG:  Unsteadiness on feet  Other abnormalities of gait and mobility  Dizziness and giddiness  ONSET DATE: 07/12/2022  Rationale for Evaluation and Treatment: Rehabilitation  SUBJECTIVE:   SUBJECTIVE STATEMENT: Patient goes by "Lindsay Rios."   Still having dizziness when she stands up and if she turns too fast. Has not yet noticed a change with her balance. Has some trouble with the heel toe exercises   Pt accompanied by: self  PERTINENT HISTORY: PMH: hypertension, DM2, hyperlipidemia, breast cancer, OSA on CPAP, cervical spondylosis with myelopathy and radiculopathy, ADD , osteopenia   Per Dr. Karel Jarvis: Vestibular testing July 2023 at the [facility] showed no evidence of peripheral vestibular deficit, no evidence of benign positional vertigo,  Orthostatic blood pressure change, tachycardia: Most consistent with her deconditioning, lack of regular sleep schedule, worsening depression, with a background of diabetic small fiber neuropathy, Constant feeling of dizziness even when sitting or lying down, worse when she stands up   PAIN:  Are you having pain? No  There were no vitals filed for this visit.   Sitting, Standing Reporting no lightheadedness   PRECAUTIONS: Fall  WEIGHT BEARING RESTRICTIONS: No  FALLS: Has patient fallen in last 6 months? Yes. Number of falls 6  LIVING ENVIRONMENT: Lives with: lives with their spouse Lives in: House/apartment Stairs: Has stairs to go into basement, but does not go down there  Has following equipment at home: None  PLOF: Independent If feeling dizzy, won't go anywhere   PATIENT GOALS: wants to quit getting so dizzy, stop falling.   OBJECTIVE:   DIAGNOSTIC FINDINGS: Neurosurgery evaluation by Dr  Conchita Paris October 12, 2021, C5-6, broad-based disc osteophyte complex with right greater than left neuroforaminal stenosis mild spinal stenosis, not a surgical candidate, epidural injection planned  EMG nerve conduction study in October 2023 showed no large fiber peripheral neuropathy  CT cervical spine 01/2022: Degenerative disc disease is greatest at C5-6 and C6-7. 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   Otherwise unremarkable non-contrast MRI appearance of the brain.   COGNITION: Overall cognitive status: Within functional limits for tasks assessed   MOTION SENSITIVITY:  Motion Sensitivity Quotient Intensity: 0 = none, 1 = Lightheaded, 2 = Mild, 3 = Moderate, 4 = Severe, 5 = Vomiting  Intensity  1. Sitting to supine 0  2. Supine to L side 2  3. Supine to R side 0  4. Supine to sitting 1  5. L Hallpike-Dix - modified sidelying due to neck 2  6. Up from L - modified sidelying due to neck  3  7. R Hallpike-Dix - modified sidelying due to neck 0  8. Up from R  - modified sidelying due to neck  3  9. Sitting, head tipped to L knee 2  10. Head up from L knee 2  11. Sitting, head tipped to R knee 2  12. Head up from R knee 2  13. Sitting head turns x5 3  14.Sitting head nods x5 3  15. In stance, 180 turn to L  0, has to have a wider BOS  16. In stance, 180 turn to R 0, has to have a wider BOS      VESTIBULAR TREATMENT:                                                                                                     NMR:   3/4 tandem stance on level ground, 2 x 10 reps bilat ball toss with PT, pt more challenged with LLE posteriorly, cues to look at ball with eyes when catching On single pillow: wide BOS > more narrow BOS, 2 x 10 reps head turns, 2 x 10 reps head nods, pt more challenged  with balance with head turns  Alternating forward stepping with head turns to R/L x10 reps, then head nods up and down x10 reps, intermittent unsteadiness  On air ex: Alternating SLS taps to 2 cones forwards x10 reps each side, then forward and cross body tap x10 reps each side with postural sway At bottom of stair case, alternating step up/up and down/down back to air ex x10 reps, then performed step up with contralateral march for dynamic SLS x10 reps each side  10 reps mini squats with cues for proper technique and sitting weight back into heels  Holding ball and making CW/CCW circles for gaze stabilization x10 reps each direction, unsteadiness for balance Forward gait 230' x 1 with head turns to R/L to name cards in multi-directions, intermittent min guard for balance    PATIENT EDUCATION: Education details: Gait with head motions and alternating stepping with head motions to HEP  Person educated: Patient Education method: Explanation, Demonstration, Verbal cues, and Handouts Education comprehension: verbalized understanding and returned demonstration  HOME EXERCISE PROGRAM: Access Code: [email or web address] URL: [email or web address] Date: 08/11/2022 Prepared by: Maryruth Eve  Exercises -Standing VOR x1 for 30 seconds - Standing on Foam Pad  - 1 x daily - 5 x weekly - 3 sets - 30 seconds hold - Corner Balance Feet Together: Eyes Closed With Head Turns  - 1 x daily - 5 x weekly - 2 sets - 10 reps - Tandem Stance  - 2 x daily - 5 x weekly - 3 sets - 30 hold - Tandem Walking with Counter Support  - 2 x daily - 5 x weekly - 3 sets - Walking with Head Rotation  - 1 x daily - 5 x weekly - 3 sets - Alternating Step Forward with Support  - 1 x daily - 5 x weekly - 1-2 sets - with head turns to R/L   GOALS: Goals reviewed with patient? Yes  SHORT TERM GOALS: ALL STGS = LTGS  LONG TERM GOALS: Target date: 08/25/2022 UPDATED GOAL DATE FOR POC: 09/15/22  Pt will be independent with  final HEP for vestibular related deficits/balance in order to build upon functional gains made in therapy. Baseline:  Goal status: INITIAL  2.  Pt will be able to hold condition 4 of mCTSIB for at least 15 seconds in order to demo improved vestibular input for balance.  Baseline: 8.4 seconds  Goal status: INITIAL  3.  Pt will report items on MSQ as 1/5 or less in order to demo improved motion sensitivity/dizziness.  Baseline:  Goal status: INITIAL  4.  Pt will improve DVA to 2 line difference or less with no dizziness in order to demo improved gaze stabilization.  Baseline:  Goal status: INITIAL  ASSESSMENT:  CLINICAL IMPRESSION: Today's skilled session focused on balance tasks for incr vestibular input, narrow BOS, head motions and on compliant surfaces. Pt reporting no dizziness during session, just feeling off balance, esp with head turns. Pt with incr postural sway with SLS tasks on compliant surfaces.  Will continue to progress towards LTGs.   OBJECTIVE IMPAIRMENTS: Abnormal gait, decreased activity tolerance, decreased balance, difficulty walking, dizziness, and impaired sensation.   ACTIVITY LIMITATIONS: bending, transfers, bed mobility, and locomotion level  PARTICIPATION LIMITATIONS: driving and community activity  PERSONAL FACTORS: Age, Behavior pattern, Past/current experiences, Time since onset of injury/illness/exacerbation, 3+ comorbidities: finances, and hypertension, DM2, hyperlipidemia, breast cancer, OSA on CPAP, cervical spondylosis with myelopathy and radiculopathy, ADD , osteopenia   are also affecting patient's functional outcome.   REHAB POTENTIAL: Good  CLINICAL DECISION MAKING: Evolving/moderate complexity  EVALUATION COMPLEXITY: Moderate   PLAN:  PT FREQUENCY: 1x/week - due to finances   PT DURATION: 8 weeks  PLANNED INTERVENTIONS: Therapeutic exercises, Therapeutic activity, Neuromuscular re-education, Balance training, Gait training,  Patient/Family education, Self Care, Vestibular training, Canalith repositioning, DME instructions, and Re-evaluation  PLAN FOR NEXT SESSION: check goals, re-cert or D/C?   How is HEP for balance going - EC, unlevel surfaces, VOR exercise progression.    Drake Leach, PT, DPT 09/01/2022, 1:14 PM

## 2022-09-15 ENCOUNTER — Ambulatory Visit: Payer: Managed Care, Other (non HMO) | Admitting: Physical Therapy

## 2022-09-21 ENCOUNTER — Ambulatory Visit: Payer: Managed Care, Other (non HMO) | Admitting: Physical Therapy

## 2022-09-26 ENCOUNTER — Ambulatory Visit: Payer: Managed Care, Other (non HMO) | Attending: Cardiology | Admitting: Cardiology

## 2022-09-26 ENCOUNTER — Encounter: Payer: Self-pay | Admitting: Cardiology

## 2022-09-26 VITALS — BP 114/76 | HR 93 | Ht 61.0 in | Wt 142.2 lb

## 2022-09-26 DIAGNOSIS — R6889 Other general symptoms and signs: Secondary | ICD-10-CM

## 2022-09-26 DIAGNOSIS — I951 Orthostatic hypotension: Secondary | ICD-10-CM

## 2022-09-26 DIAGNOSIS — E119 Type 2 diabetes mellitus without complications: Secondary | ICD-10-CM | POA: Diagnosis not present

## 2022-09-26 NOTE — Progress Notes (Signed)
Cardiology Office Note:    Date:  09/26/2022   ID:  Lindsay Rios, DOB April 22, 1965, MRN [id number]  PCP:  Milus Height, PA   [facility] Providers Cardiologist:  None     Referring MD: Milus Height, PA    History of Present Illness:    Lindsay Rios is a 57 y.o. female Discussed the use of AI scribe software for clinical note transcription with the patient, who gave verbal consent to proceed.  History of Present Illness   Lindsay Rios, a patient with a history of CAD, non obstructive heart disease, diabetes, and small fiber neuropathy, presents with ongoing dizziness and balance issues. She reports frequent falls, often falling backwards, resulting in various injuries including a recent fall where she hit a tree and scraped her back. The patient describes her dizziness as intermittent and often occurring when she gets up too quickly or turns too fast. She has been managing this by sitting on the edge of the bed before standing and holding onto walls when walking. Despite occupational therapy, her balance issues persist and have not improved.  The patient also reports a loss of muscle and strength, which she attributes to her recent weight loss of 30 pounds (Ozempic). She has been taking lisinopril hydrochlorothiazide for her blood pressure, which has been well controlled. However, she has noticed a considerable drop in her blood pressure when changing positions, which could be contributing to her dizziness.  The patient has been under significant stress, having lost both her parents last year and having to quit her job to take care of them. She has been seeing multiple specialists, including a neurologist, to investigate her symptoms. She has undergone extensive testing, including MRIs and a nerve biopsy, which confirmed small fiber neuropathy.        Past Medical History:  Diagnosis Date   ADD (attention deficit disorder)    Allergy    Depression    Ejection  fraction    Gestational diabetes 1999; 2002   History of chicken pox    Human papilloma virus    High Risk    Hyperlipidemia    Hypertension    Numbness and tingling    Osteopenia    Pneumonia 2012   Sleep apnea with use of continuous positive airway pressure (CPAP)    Type II diabetes mellitus (HCC)    "borderline; I'm on Metformin"    Past Surgical History:  Procedure Laterality Date   ANAL RECTAL MANOMETRY N/A 03/17/2015   Procedure: ANO RECTAL MANOMETRY;  Surgeon: Napoleon Form, MD;  Location: [facility];  Service: Endoscopy;  Laterality: N/A;   BREAST LUMPECTOMY WITH RADIOACTIVE SEED LOCALIZATION Right 06/05/2019   Procedure: [name];  Surgeon: Abigail Miyamoto, MD;  Location: [facility];  Service: General;  Laterality: Right;   BREAST RECONSTRUCTION WITH PLACEMENT OF TISSUE EXPANDER AND FLEX HD (ACELLULAR HYDRATED DERMIS) Bilateral 04/19/2020   Procedure: IMMEDIATE BILATERAL BREAST RECONSTRUCTION WITH PLACEMENT OF TISSUE EXPANDER AND FLEX HD (ACELLULAR HYDRATED DERMIS);  Surgeon: Peggye Form, DO;  Location: [facility];  Service: Plastics;  Laterality: Bilateral;   LEFT HEART CATHETERIZATION WITH CORONARY ANGIOGRAM N/A 12/22/2013   Procedure: LEFT HEART CATHETERIZATION WITH CORONARY ANGIOGRAM;  Surgeon: Corky Crafts, MD;  [facility];  Service: Cardiovascular;  Laterality: N/A;   NIPPLE SPARING MASTECTOMY Bilateral 04/19/2020   Procedure: BILATERAL NIPPLE SPARING MASTECTOMY;  Surgeon: Abigail Miyamoto, MD;  Location: [facility];  Service: General;  Laterality: Bilateral;   REMOVAL OF TISSUE EXPANDER AND PLACEMENT OF IMPLANT Bilateral 08/12/2020   Procedure: REMOVAL OF TISSUE EXPANDER AND PLACEMENT OF IMPLANT;  Surgeon: Peggye Form, DO;  Location: [facility];  Service: Plastics;  Laterality: Bilateral;  90 min   SHOULDER ARTHROSCOPY  W/ ROTATOR CUFF REPAIR Right 1990's   VAGINAL HYSTERECTOMY  2013   HPV/Cervical Dysplasia; partial    Current Medications: Current Meds  Medication Sig   amphetamine-dextroamphetamine (ADDERALL XR) 20 MG 24 hr capsule Take 20 mg by mouth daily.   aspirin 81 MG chewable tablet Chew 81 mg by mouth daily.   cetirizine (ZYRTEC) 10 MG tablet Take 10 mg by mouth daily.   Cholecalciferol (PA VITAMIN D-3 GUMMY PO) Chew one (1) gummy by mouth daily.   empagliflozin (JARDIANCE) 25 MG TABS tablet Take 25 mg by mouth daily.   escitalopram (LEXAPRO) 20 MG tablet Take 1 tablet by mouth daily.   lisinopril-hydrochlorothiazide (ZESTORETIC) 20-25 MG tablet Take 0.5 tablets by mouth daily. Take 1/2 tablet daily   loperamide (IMODIUM) 2 MG capsule Take 1 capsule (2 mg total) by mouth 4 (four) times daily as needed for diarrhea or loose stools.   Multiple Vitamins-Minerals (ALIVE WOMENS GUMMY PO) Chew one (1) gummy by mouth daily.   OZEMPIC, 0.25 OR 0.5 MG/DOSE, 2 MG/3ML SOPN Inject 0.25 mg into the skin once a week.   rosuvastatin (CRESTOR) 40 MG tablet Take 40 mg by mouth daily.   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   Occupation: Full- Time Student    Education: [employer] ([employer] Program)    Tobacco Use: She has never smoked.     Alcohol Use:  Rarely   Drug Use:  None   Diet:  Regular   Exercise:  None   Hobbies: Shopping      Left handed    Social Determinants of Health   Financial Resource Strain: Not on file  Food Insecurity: Not on file  Transportation Needs: Not on file  Physical Activity: Not on file  Stress: Not on file  Social Connections: Not on file     Family History: The patient's family history includes Alzheimer's disease in her maternal grandmother; Breast cancer in her paternal aunt; Cancer in her maternal grandmother;  Colon cancer in her maternal grandmother; Diabetes in her father; Heart disease in her father; Heart disease (age of onset: 66) in her brother; Hyperlipidemia in her father and mother; Hypertension in her father; Stroke in her father. There is no history of Esophageal cancer, Stomach cancer, or Rectal cancer.  ROS:   Please see the history of present illness.    No syncope, No recent CP, no SOB. No palps.  All other systems reviewed and are negative.  EKGs/Labs/Other Studies Reviewed:    The following studies were reviewed today: RADIOLOGY Coronary CT scan: Very mild nonobstructive coronary artery disease, Ca++ score 2, 20% calcified plaque, mid vessel bridging (04/12/2016)  PATHOLOGY Nerve biopsy: Small fiber neuropathy  EKG:  The ekg ordered today demonstrates EKG Interpretation Date/Time:  Tuesday September 26 2022 13:23:08 EDT Ventricular Rate:  84 PR Interval:  184 QRS Duration:  86 QT Interval:  396 QTC Calculation: 467 R Axis:   81  Text Interpretation: Normal sinus rhythm Low voltage QRS When compared with ECG of 31-Jan-2022 14:59,  Minimal criteria for Anterior infarct are no longer Present Confirmed by Donato Schultz ([id number]) on 09/26/2022 1:52:37 PM    Recent Labs: 11/02/2021: TSH 0.754 01/31/2022: BUN 20; Creatinine, Ser 0.62; Hemoglobin 16.8; Platelets 229; Potassium 4.0; Sodium 140  Recent Lipid Panel    Component Value Date/Time   CHOL 208 (H) 06/07/2015 1056   CHOL 132 03/26/2013 1138   TRIG 155.0 (H) 06/07/2015 1056   TRIG 135 03/26/2013 1138   HDL 48.70 06/07/2015 1056   HDL 47 03/26/2013 1138   CHOLHDL 4 06/07/2015 1056   VLDL 31.0 06/07/2015 1056   LDLCALC 128 (H) 06/07/2015 1056   LDLCALC 58 03/26/2013 1138   LDLDIRECT 162.1 08/30/2006 1208     Risk Assessment/Calculations:               Physical Exam:    VS:  BP 114/76   Pulse 93   Ht 5\' 1"  (1.549 m)   Wt 142 lb 3.2 oz (64.5 kg)   SpO2 94%   BMI 26.87 kg/m     Wt Readings from Last 3 Encounters:  09/26/22 142 lb 3.2 oz (64.5 kg)  08/08/22 142 lb 3.2 oz (64.5 kg)  07/12/22 142 lb 12.8 oz (64.8 kg)     GEN:  Well nourished, well developed in no acute distress HEENT: Normal NECK: No JVD; No carotid bruits LYMPHATICS: No lymphadenopathy CARDIAC: RRR, no murmurs, rubs, gallops RESPIRATORY:  Clear to auscultation without rales, wheezing or rhonchi  ABDOMEN: Soft, non-tender, non-distended MUSCULOSKELETAL:  No edema; No deformity  SKIN: Warm and dry NEUROLOGIC:  Alert and oriented x 3 PSYCHIATRIC:  Normal affect   ASSESSMENT:    1. Orthostatic hypotension   2. Hyperemia   3. Type 2 diabetes mellitus without complication, without long-term current use of insulin (HCC)    PLAN:    In order of problems listed above:  Assessment and Plan    Orthostatic Hypotension Reports of dizziness and balance issues, particularly upon standing. Blood pressure measurements indicate a significant drop from lying to standing. Currently on Lisinopril-HCTZ for hypertension. -Reduce Lisinopril-HCTZ to half the current dose to potentially  alleviate symptoms. -Encourage hydration and slow positional changes.  Small Fiber Neuropathy Diagnosed by neurologist, possibly contributing to balance issues. Patient has a history of diabetes which could be a contributing factor. -Continue current management under neurologist's care.  Frequent Falls Multiple instances of falling, often backwards, resulting in injuries. Likely related to balance issues  from orthostatic hypotension and small fiber neuropathy. -Continue with occupational therapy exercises to improve balance and strength. -Encourage safe practices at home to prevent falls.  Follow-up in 3 months with the physician assistant to assess response to medication adjustment and overall progress.                Medication Adjustments/Labs and Tests Ordered: Current medicines are reviewed at length with the patient today.  Concerns regarding medicines are outlined above.  Orders Placed This Encounter  Procedures   EKG 12-Lead   No orders of the defined types were placed in this encounter.   Patient Instructions  Medication Instructions:  Please decrease Lisinopril/hydrochlorothiazide to 1/2 tablet daily. Continue all other medications as listed.  *If you need a refill on your cardiac medications before your next appointment, please call your pharmacy*  Follow-Up: At [facility], you and your health needs are our priority.  As part of our continuing mission to provide you with exceptional heart care, we have created designated Provider Care Teams.  These Care Teams include your primary Cardiologist (physician) and Advanced Practice Providers (APPs -  Physician Assistants and Nurse Practitioners) who all work together to provide you with the care you need, when you need it.  We recommend signing up for the patient portal called "MyChart".  Sign up information is provided on this After Visit Summary.  MyChart is used to connect with patients for Virtual Visits  (Telemedicine).  Patients are able to view lab/test results, encounter notes, upcoming appointments, etc.  Non-urgent messages can be sent to your provider as well.   To learn more about what you can do with MyChart, go to [email or web address].    Your next appointment:   3 month(s)  Provider:   Jari Favre, PA-C            Signed, Donato Schultz, MD  09/26/2022 2:18 PM    [facility]

## 2022-09-26 NOTE — Progress Notes (Deleted)
EK 

## 2022-09-26 NOTE — Patient Instructions (Signed)
Medication Instructions:  Please decrease Lisinopril/hydrochlorothiazide to 1/2 tablet daily. Continue all other medications as listed.  *If you need a refill on your cardiac medications before your next appointment, please call your pharmacy*  Follow-Up: At [facility], you and your health needs are our priority.  As part of our continuing mission to provide you with exceptional heart care, we have created designated Provider Care Teams.  These Care Teams include your primary Cardiologist (physician) and Advanced Practice Providers (APPs -  Physician Assistants and Nurse Practitioners) who all work together to provide you with the care you need, when you need it.  We recommend signing up for the patient portal called "MyChart".  Sign up information is provided on this After Visit Summary.  MyChart is used to connect with patients for Virtual Visits (Telemedicine).  Patients are able to view lab/test results, encounter notes, upcoming appointments, etc.  Non-urgent messages can be sent to your provider as well.   To learn more about what you can do with MyChart, go to [email or web address].    Your next appointment:   3 month(s)  Provider:   Jari Favre, PA-C

## 2022-09-28 ENCOUNTER — Encounter: Payer: Self-pay | Admitting: Physical Therapy

## 2022-09-28 ENCOUNTER — Ambulatory Visit: Payer: Managed Care, Other (non HMO) | Admitting: Physical Therapy

## 2022-09-28 VITALS — BP 126/79 | HR 92

## 2022-09-28 DIAGNOSIS — R42 Dizziness and giddiness: Secondary | ICD-10-CM

## 2022-09-28 DIAGNOSIS — R2689 Other abnormalities of gait and mobility: Secondary | ICD-10-CM

## 2022-09-28 DIAGNOSIS — R2681 Unsteadiness on feet: Secondary | ICD-10-CM | POA: Diagnosis not present

## 2022-09-28 NOTE — Therapy (Signed)
OUTPATIENT PHYSICAL THERAPY VESTIBULAR TREATMENT / RE-CERT / DISCHARGE  PHYSICAL THERAPY DISCHARGE SUMMARY  Visits from Start of Care: 5  Current functional level related to goals / functional outcomes: See below   Remaining deficits: Ongoing falls risk, open to now using AD   Education / Equipment: Educated on appropriate AD at this time   Patient agrees to discharge. Patient goals were partially met. Patient is being discharged due to maximized rehab potential.    Patient Name: Lindsay Rios MRN: [id number] DOB:1965-11-14, 57 y.o., female Today's Date: 09/28/2022  END OF SESSION:  PT End of Session - 09/28/22 1507     Visit Number 5    Number of Visits 5    Date for PT Re-Evaluation 09/26/22    Authorization Type [employer] - $50 co pay    PT Start Time 1509    Equipment Utilized During Treatment Gait belt    Activity Tolerance Patient tolerated treatment well    Behavior During Therapy [facility] for tasks assessed/performed               Past Medical History:  Diagnosis Date   ADD (attention deficit disorder)    Allergy    Depression    Ejection fraction    Gestational diabetes 1999; 2002   History of chicken pox    Human papilloma virus    High Risk    Hyperlipidemia    Hypertension    Numbness and tingling    Osteopenia    Pneumonia 2012   Sleep apnea with use of continuous positive airway pressure (CPAP)    Type II diabetes mellitus (HCC)    "borderline; I'm on Metformin"   Past Surgical History:  Procedure Laterality Date   ANAL RECTAL MANOMETRY N/A 03/17/2015   Procedure: ANO RECTAL MANOMETRY;  Surgeon: Lindsay Form, MD;  Location: [facility];  Service: Endoscopy;  Laterality: N/A;   BREAST LUMPECTOMY WITH RADIOACTIVE SEED LOCALIZATION Right 06/05/2019   Procedure: [name];  Surgeon: Lindsay Miyamoto, MD;  Location: [facility];  Service: General;  Laterality: Right;    BREAST RECONSTRUCTION WITH PLACEMENT OF TISSUE EXPANDER AND FLEX HD (ACELLULAR HYDRATED DERMIS) Bilateral 04/19/2020   Procedure: IMMEDIATE BILATERAL BREAST RECONSTRUCTION WITH PLACEMENT OF TISSUE EXPANDER AND FLEX HD (ACELLULAR HYDRATED DERMIS);  Surgeon: Lindsay Form, DO;  Location: [facility];  Service: Plastics;  Laterality: Bilateral;   LEFT HEART CATHETERIZATION WITH CORONARY ANGIOGRAM N/A 12/22/2013   Procedure: LEFT HEART CATHETERIZATION WITH CORONARY ANGIOGRAM;  Surgeon: Lindsay Crafts, MD;  Location: [facility];  Service: Cardiovascular;  Laterality: N/A;   NIPPLE SPARING MASTECTOMY Bilateral 04/19/2020   Procedure: BILATERAL NIPPLE SPARING MASTECTOMY;  Surgeon: Lindsay Miyamoto, MD;  Location: [facility];  Service: General;  Laterality: Bilateral;   REMOVAL OF TISSUE EXPANDER AND PLACEMENT OF IMPLANT Bilateral 08/12/2020   Procedure: REMOVAL OF TISSUE EXPANDER AND PLACEMENT OF IMPLANT;  Surgeon: Lindsay Form, DO;  Location: [facility];  Service: Plastics;  Laterality: Bilateral;  90 min   SHOULDER ARTHROSCOPY W/ ROTATOR CUFF REPAIR Right 1990's   VAGINAL HYSTERECTOMY  2013   HPV/Cervical Dysplasia; partial   Patient Active Problem List   Diagnosis Date Noted   Dizziness 02/13/2022   Incontinence of feces 11/02/2021   Right cervical radiculopathy 09/12/2021   Gait abnormality 08/25/2021   History of mastectomy 06/14/2021   Benign paroxysmal positional vertigo of left ear 04/26/2021   Poorly  controlled diabetes mellitus (HCC) 04/26/2021   Acquired absence of breast 04/27/2020   Breast cancer (HCC) 04/19/2020   Atypical ductal hyperplasia of right breast 07/03/2019   Osteopenia 03/20/2019   Irritable bowel syndrome 03/18/2019   Chest pain radiating to jaw 04/12/2016   OSA on CPAP 04/12/2016   Family history of early CAD 04/12/2016   Vitamin D deficiency 06/07/2015   Ulnar neuropathy at elbow of left upper extremity  05/25/2015   Constipation    Spondylosis, cervical, with myelopathy 01/11/2015   HTN (hypertension) 09/09/2014   Plantar fasciitis, bilateral 09/09/2014   HLD (hyperlipidemia) 02/22/2014   DM type 2 (diabetes mellitus, type 2) (HCC) 02/09/2014   Depression 06/14/2013   Stress and adjustment reaction 06/14/2013   Attention or concentration deficit 06/02/2012   Allergic rhinitis 09/25/2006    PCP: Lindsay Height, PA  REFERRING PROVIDER: Van Clines, MD  REFERRING DIAG: R42 (ICD-10-CM) - Dizziness R26.81 (ICD-10-CM) - Gait instability G62.9 (ICD-10-CM) - Neuropathy  THERAPY DIAG:  Unsteadiness on feet  Other abnormalities of gait and mobility  Dizziness and giddiness  ONSET DATE: 07/12/2022  Rationale for Evaluation and Treatment: Rehabilitation  SUBJECTIVE:   SUBJECTIVE STATEMENT: Patient goes by Lindsay Rios."   Patient reports 2 falls since last here. Patient did report hitting her head with one fall but has followed up with doctor. Patient told to cut BP medication in half resulting in orthostatic hypotension. Patient ready for D/C today but would like to trial AD today.   Pt accompanied by: self  PERTINENT HISTORY: PMH: hypertension, DM2, hyperlipidemia, breast cancer, OSA on CPAP, cervical spondylosis with myelopathy and radiculopathy, ADD , osteopenia   Per Dr. Karel Rios: Vestibular testing July 2023 at the [facility] showed no evidence of peripheral vestibular deficit, no evidence of benign positional vertigo,  Orthostatic blood pressure change, tachycardia: Most consistent with her deconditioning, lack of regular sleep schedule, worsening depression, with a background of diabetic small fiber neuropathy, Constant feeling of dizziness even when sitting or lying down, worse when she stands up   PAIN:  Are you having pain? No  There were no vitals filed for this visit.  Sitting, Standing Reporting no lightheadedness   PRECAUTIONS: Fall  WEIGHT BEARING  RESTRICTIONS: No  FALLS: Has patient fallen in last 6 months? Yes. Number of falls 6  LIVING ENVIRONMENT: Lives with: lives with their spouse Lives in: House/apartment Stairs: Has stairs to go into basement, but does not go down there  Has following equipment at home: None  PLOF: Independent If feeling dizzy, won't go anywhere   PATIENT GOALS: wants to quit getting so dizzy, stop falling.   OBJECTIVE:   DIAGNOSTIC FINDINGS: Neurosurgery evaluation by Dr Conchita Paris October 12, 2021, C5-6, broad-based disc osteophyte complex with right greater than left neuroforaminal stenosis mild spinal stenosis, not a surgical candidate, epidural injection planned  EMG nerve conduction study in October 2023 showed no large fiber peripheral neuropathy  CT cervical spine 01/2022: Degenerative disc disease is greatest at C5-6 and C6-7. 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   Otherwise unremarkable non-contrast MRI appearance of the brain.   COGNITION: Overall cognitive status: Within functional limits for tasks assessed  VESTIBULAR TREATMENT:  THERACT (GOAL CHECK):  VESTIBULAR - OCULAR REFLEX:   Dynamic Visual Acuity: Static: 10 Dynamic: 9   Line Difference: 1 line - WNL   MOTION SENSITIVITY:    Motion Sensitivity Quotient Intensity: 0 = none, 1 = Lightheaded, 2 = Mild, 3 = Moderate, 4 = Severe, 5 = Vomiting  Intensity  1. Sitting to supine   2. Supine to L side   3. Supine to R side   4. Supine to sitting   5. L Hallpike-Dix   6. Up from L    7. R Hallpike-Dix   8. Up from R    9. Sitting, head  tipped to L knee 0  10. Head up from L  knee 2  11. Sitting, head  tipped to R knee 0  12. Head up  from R  knee 2  13. Sitting head turns x5 2  14.Sitting head nods x5 2  15. In stance, 180  turn to L  2  16. In stance, 180  turn to R 2     M-CTSIB  Condition 1: Firm Surface, EO NT  Condition 2: Firm Surface, EC NT  Condition 3: Foam Surface, EO NT  Condition 4: Foam Surface, EC 10 Sec, mpd sway      Gait: AD trial: Patient trialed SPC, SPC with rubber quad tip, SBQC, and trekking poles 1 x 115 feet with each. Patient was most appropriately safe with SPC with rubber quad tip and trekking poles; however patient preferred SPC with rubber quad tip. Patient politely refused to trial outside of clinic at this date.    PATIENT EDUCATION: Education details: When to return to PT, AD education, Progress towards goals Person educated: Patient Education method: Explanation, Demonstration, Verbal cues, and Handouts Education comprehension: verbalized understanding  HOME EXERCISE PROGRAM: Access Code: [email or web address] URL: [email or web address] Date: 08/11/2022 Prepared by: Maryruth Eve  Exercises -Standing VOR x1 for 30 seconds - Standing on Foam Pad  - 1 x daily - 5 x weekly - 3 sets - 30 seconds hold - Corner Balance Feet Together: Eyes Closed With Head Turns  - 1 x daily - 5 x weekly - 2 sets - 10 reps - Tandem Stance  - 2 x daily - 5 x weekly - 3 sets - 30 hold - Tandem Walking with Counter Support  - 2 x daily - 5 x weekly - 3 sets - Walking with Head Rotation  - 1 x daily - 5 x weekly - 3 sets - Alternating Step Forward with Support  - 1 x daily - 5 x weekly - 1-2 sets - with head turns to R/L   GOALS: Goals reviewed with patient? Yes  SHORT TERM GOALS: ALL STGS = LTGS  LONG TERM GOALS: Target date: 08/25/2022 UPDATED GOAL DATE FOR POC: 09/15/22  Pt will be independent with final HEP for vestibular related deficits/balance in order to build upon functional gains made in therapy. Baseline: Reports feeling confident with final HEP Goal status: MET  2.  Pt will  be able to hold condition 4 of mCTSIB for at least 15 seconds in order to demo improved vestibular input for balance.  Baseline: 8.4 seconds; 10 seconds  Goal status: NOT MET  3.  Pt will report items on MSQ as 1/5 or less in order to demo improved motion sensitivity/dizziness.  Baseline: no 3/5 today but a few 2/5 Goal status: NOT MET  4.  Pt will improve DVA to 2 line difference or less with no  dizziness in order to demo improved gaze stabilization.  Baseline: Line Difference: 1 line - WNL Goal status: MET  ASSESSMENT:  CLINICAL IMPRESSION: Patient is D/C from physical therapy due to believing she has achieved maximal rehab potential at this time. Patient is open to trying AD in today's session given recent falls and did enjoy use of SPC with rubber tip the best and was appropriately safe. Therapist would likely recommend trekking poles for max stability but patient did not prefer at this time. Patient achieved 2/4 LTGs, demonstrating improved gaze stability; however, EC on foam and MSQ continue to be challenging with small improvements from baseline. Patient demonstrates independence with final HEP.   OBJECTIVE IMPAIRMENTS: Abnormal gait, decreased activity tolerance, decreased balance, difficulty walking, dizziness, and impaired sensation.   ACTIVITY LIMITATIONS: bending, transfers, bed mobility, and locomotion level  PARTICIPATION LIMITATIONS: driving and community activity  PERSONAL FACTORS: Age, Behavior pattern, Past/current experiences, Time since onset of injury/illness/exacerbation, 3+ comorbidities: finances, and hypertension, DM2, hyperlipidemia, breast cancer, OSA on CPAP, cervical spondylosis with myelopathy and radiculopathy, ADD , osteopenia   are also affecting patient's functional outcome.   REHAB POTENTIAL: Good  CLINICAL DECISION MAKING: Evolving/moderate complexity  EVALUATION COMPLEXITY: Moderate   PLAN:  PT FREQUENCY: 1x/week - due to finances   PT  DURATION: 1 more visit recerting for D/C  PLANNED INTERVENTIONS: Therapeutic exercises, Therapeutic activity, Neuromuscular re-education, Balance training, Gait training, Patient/Family education, Self Care, Vestibular training, Canalith repositioning, DME instructions, and Re-evaluation  PLAN FOR NEXT SESSION: Patient D/C with updated HEP   Carmelia Bake, PT, DPT 09/28/2022, 3:14 PM

## 2022-10-25 ENCOUNTER — Encounter: Payer: Self-pay | Admitting: Neurology

## 2022-10-25 ENCOUNTER — Ambulatory Visit (INDEPENDENT_AMBULATORY_CARE_PROVIDER_SITE_OTHER): Payer: Managed Care, Other (non HMO) | Admitting: Neurology

## 2022-10-25 VITALS — BP 148/92 | HR 118 | Ht 62.0 in | Wt 144.6 lb

## 2022-10-25 DIAGNOSIS — G629 Polyneuropathy, unspecified: Secondary | ICD-10-CM

## 2022-10-25 DIAGNOSIS — R42 Dizziness and giddiness: Secondary | ICD-10-CM | POA: Diagnosis not present

## 2022-10-25 DIAGNOSIS — R296 Repeated falls: Secondary | ICD-10-CM

## 2022-10-25 MED ORDER — VENLAFAXINE HCL ER 37.5 MG PO CP24: ORAL_CAPSULE | ORAL | 6 refills | Status: DC

## 2022-10-25 NOTE — Patient Instructions (Addendum)
Good to see you.  Wean off the Lexapro (Escitalopram): take 1/2 tablet daily for 1 week, then stop Lexapro  2. After stopping Lexapro, start Venlafaxine XR 37.5mg  every night  3. Referral will be sent to [facility] Balance disorders clinic. If you don't hear from them, here is there number to follow-up on appointment scheduling: [phone]  4. Use cane at all times  5. Follow-up in 3 months, call for any changes

## 2022-10-25 NOTE — Progress Notes (Unsigned)
NEUROLOGY FOLLOW UP OFFICE NOTE  Lindsay Rios [id number] April 24, 1965  HISTORY OF PRESENT ILLNESS: I had the pleasure of seeing Lindsay Rios in follow-up in the neurology clinic on 10/25/2022.  The patient was last seen 3 months ago for dizziness. She is alone in the office today. Records and images were personally reviewed where available.  She has had an extensive evaluation with Dr. Terrace Arabia with unremarkable brain MRI, cervical CT, MRI thoracic/lumbar spine, and EMG/NCV showing mild radiculopathy. Exam had shown a length-dependent neuropathy, she had a skin biopsy last 07/2022 which confirmed small fiber neuropathy. Neuropathy bloodwork in 10/2021 was normal except for an HbA1c of 7.7. It was 6.8 in 05/2022.She has done physical therapy and was discharged last month with goals partially met, there was improved gaze stability however "EC on foam and MSQ continue to be challenging with small improvements from baseline." She was advised on using an assistive device and is still getting used to her cane. She continues to report frequent falls, she had 3 in the past 2 months, last fall was 2.5 weeks ago. One time she went backwards and hit a tree. If she turns her head quick, she gets off balance. She denies any headaches, vision changes, focal weakness.    History on Initial Assessment 07/12/2022: This is a 57 year old left-handed woman with a history of hypertension, DM2, hyperlipidemia, breast cancer, OSA on CPAP, osteopenia, cervical spondylosis with myelopathy and radiculopathy, ADD, presenting for second opinion regarding dizziness. She was evaluated by neurologist Dr. Terrace Arabia in January 2024 for similar symptoms. She reports symptoms started a couple of years ago but have gotten worse. She describes difficulty with balance, she has to hold on to walls when getting up. When she falls, she would fall backwards and loses strength in both legs and arms. When supine, the dizziness may have a spinning  component. If she turns or moves around a lot, there is also some spinning. She had seen ENT and was told it is not her inner ear. When she stood up, her gait is off. She describes a constant sensation of dizziness but worse when she stands up and feels she cannot walk. She states she falls constantly. She bent to pick up a dropped spoon last month and fell backwards, hitting her head on the cabinet. She cannot wear any type of heels due to balance issues. If she touches something with her foot, it may throw her off balance. She denies any presyncopal sensation, no loss of consciousness. No staring episodes or confusion. No headaches, diplopia, dysarthria/dysphagia (but has noticed she get choked more when drinking from a straw), hearing loss, or tinnitus. She has numbness in her fingers and toes. HbA1c last month was 6.9. She has bowel and bladder issues and has taken some of her father's Myrbetriq which helped her some. She also takes Imodium every other day. No perineal numbness. She has neck and back pain. Neck injections did not help. She did PT twice a week a year ago with [facility] but they were charging $50 for each visit and it was cost prohibitive. She was started on Lexapro in January 2023, she has not noticed much change with mood.  Diagnostic Data available: Vestibular testing July 2023 at the [facility] showed no evidence of peripheral vestibular deficit, no evidence of benign positional vertigo.  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   EMG/NCV at  [facility] in 10/2021 which was mildly abnormal with evidence of chronic right C7-8 radiculopathy, no evidence of active process. There is also evidence of mild bilateral L4 radiculopathy, no evidence of large fiber peripheral neuropathy or focal neuropathy.   She was seen by neurosurgeon Dr. Conchita Paris in 09/2021 for  C5-6 broad-based disc osteophyte complex with right greater than left neuroforaminal stenosis, mild spinal stenosis. Not a surgical candidate.  At Dr. Zannie Cove office, she was noted to have orthostatic tachycardia and hypotension, symptomatic after standing. Per notes, Sitting down 129/88, H 118; Standing up 106/69, HR 123; Standing up one minute 115/74, HR 141.  MRI thoracic and lumbar spine 01/2022: spinal cord appears normal. There were degenerative changes at several levels, mild to moderate left lateral recess stenosis and borderline spinal stenosis at L4-5.   CT cervical spine 01/2022: Degenerative disc disease is greatest at C5-6 and C6-7.There is a mild bilateral foraminal stenosis at C5-6.   PAST MEDICAL HISTORY: Past Medical History:  Diagnosis Date   ADD (attention deficit disorder)    Allergy    Depression    Ejection fraction    Gestational diabetes 1999; 2002   History of chicken pox    Human papilloma virus    High Risk    Hyperlipidemia    Hypertension    Numbness and tingling    Osteopenia    Pneumonia 2012   Sleep apnea with use of continuous positive airway pressure (CPAP)    Type II diabetes mellitus (HCC)    "borderline; I'm on Metformin"    MEDICATIONS: Current Outpatient Medications on File Prior to Visit  Medication Sig Dispense Refill   amphetamine-dextroamphetamine (ADDERALL XR) 20 MG 24 hr capsule Take 20 mg by mouth daily.     aspirin 81 MG chewable tablet Chew 81 mg by mouth daily.     cetirizine (ZYRTEC) 10 MG tablet Take 10 mg by mouth daily.     Cholecalciferol (PA VITAMIN D-3 GUMMY PO) Chew one (1) gummy by mouth daily.     empagliflozin (JARDIANCE) 25 MG TABS tablet Take 25 mg by mouth daily.     escitalopram (LEXAPRO) 20 MG tablet Take 1 tablet by mouth daily.     ezetimibe (ZETIA) 10 MG tablet Take 1 tablet by mouth daily.     lisinopril-hydrochlorothiazide (ZESTORETIC) 20-25 MG tablet Take 0.5 tablets by mouth daily. Take 1/2 tablet daily      loperamide (IMODIUM) 2 MG capsule Take 1 capsule (2 mg total) by mouth 4 (four) times daily as needed for diarrhea or loose stools. 12 capsule 0   Multiple Vitamins-Minerals (ALIVE WOMENS GUMMY PO) Chew one (1) gummy by mouth daily.     OZEMPIC, 0.25 OR 0.5 MG/DOSE, 2 MG/3ML SOPN Inject 0.25 mg into the skin once a week.     rosuvastatin (CRESTOR) 40 MG tablet Take 40 mg by mouth daily.     Current Facility-Administered Medications on File Prior to Visit  Medication Dose Route Frequency Provider Last Rate Last Admin   0.9 %  sodium chloride infusion  500 mL Intravenous Once Meryl Dare, MD        ALLERGIES: Allergies  Allergen Reactions   Other Nausea Only and Other (See Comments)    REACTION: swelling under skin after shots Treseba    Sulfa Antibiotics Nausea Only    REACTION: nausea   Exenatide     Other reaction(s): dizzy, upset stomach   Metformin Hcl     Other reaction(s): GI Upset (intolerance), stomach  upset    FAMILY HISTORY: Family History  Problem Relation Age of Onset   Hyperlipidemia Mother    Hyperlipidemia Father    Hypertension Father    Diabetes Father    Heart disease Father    Stroke Father    Cancer Maternal Grandmother        Colon Cancer   Alzheimer's disease Maternal Grandmother    Colon cancer Maternal Grandmother    Heart disease Brother 55       Myocardial Infarction   Breast cancer Paternal Aunt    Esophageal cancer Neg Hx    Stomach cancer Neg Hx    Rectal cancer Neg Hx     SOCIAL HISTORY: Social History   Socioeconomic History   Marital status: Married    Spouse name: Arlys John (Separated)    Number of children: 2   Years of education: 14   Highest education level: Not on file  Occupational History   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   Occupation: Full- Time Student    Education: [employer] (C[employer] Program)    Tobacco Use: She has never smoked.     Alcohol Use:  Rarely   Drug Use:  None   Diet:  Regular   Exercise:  None   Hobbies: Shopping      Left handed    Social Determinants of Health   Financial Resource Strain: Not on file  Food Insecurity: Not on file  Transportation Needs: Not on file  Physical Activity: Not on file  Stress: Not on file  Social Connections: Not on file  Intimate Partner Violence: Not on file     PHYSICAL EXAM: Vitals:   10/25/22 1344  BP: (!) 148/92  Pulse: (!) 118  SpO2: 93%   General: No acute distress Head:  Normocephalic/atraumatic Skin/Extremities: No rash, no edema Neurological Exam: alert and awake. No aphasia or dysarthria. Fund of knowledge is appropriate.  Attention and concentration are normal.   Cranial nerves: Pupils equal, round. Extraocular movements intact with no nystagmus. Visual fields full.  No facial asymmetry.  Motor: Bulk and tone normal, no cogwheeling, muscle strength 5/5 throughout with no pronator drift.   Finger to nose testing intact.  Gait slightly wide-based with cane, no ataxia. No tremors. Romberg: slight sway.   IMPRESSION: This is a 57 yo LH woman with a history of hypertension, DM2, hyperlipidemia, breast cancer, OSA on CPAP, osteopenia, cervical spondylosis and radiculopathy, ADD, with dizziness and frequent falls. Etiology unclear, she has had extensive evaluation with imaging of brain and spinal cord, no acute changes. EMG/NCV showed radiculopathy which would also not  explain symptoms. Skin biopsy did show small fiber neuropathy. We discussed how neuropathy can affect balance. She however also describes a constant sensation of dizziness, worse when standing. We discussed a trial  of Venlafaxine for Persistent Postural Perceptual Dizziness (PPPD). She will wean off the Lexapro then start low dose Venlafaxine XR 37.5mg  at bedtime. Side effects discussed. She will also be referred to [facility] Disorders clinic for second opinion. Continue home exercise program and using assistive device for balance. Follow-up in 3-4 months, call for any changes.    Thank you for allowing me to participate in her care.  Please do not hesitate to call for any questions or concerns.    Lindsay Rios, M.D.   CC: Milus Height, [place]

## 2022-11-14 ENCOUNTER — Encounter: Payer: Self-pay | Admitting: Neurology

## 2022-12-31 NOTE — Progress Notes (Unsigned)
  Cardiology Office Note:  .   Date:  12/31/2022  ID:  Lindsay Rios, DOB 12/12/1965, MRN [id number] PCP: Milus Height, PA  [facility] Providers Cardiologist:  None {  History of Present Illness: .   Lindsay Rios is a 57 y.o. female with a past medical history of CAD, nonobstructive heart disease, diabetes, and small fiber neuropathy who presents with ongoing dizziness and balance issues.  Now here for follow-up.  When she was last evaluated 8/24 by Dr. Anne Fu she reported frequent falls and often falling backwards resulting in various injuries including a recent fall where she hit a tree and scraped her back.  Patient described her dizziness is intermittent and often occurring when she got up too quickly or turned too fast.  She has been managing this by sitting on the edge of the bed before standing and holding onto walls when walking.  Despite Occupational Therapy, her balance issues persist and have not improved.  Patient also reports a loss of muscle strength which she attributes to recent weight loss of 30 pounds (Ozempic).  She had been taking lisinopril HCTZ for blood pressure which has been well-controlled.  However, she has noticed a considerable drop in blood pressure when changing positions which could be contributing to her dizziness.  The patient also has been under significant amount of stress, having lost both her parents last year and having to quit her job to take care of them.  She has been seeing multiple specialist including neurologist, to investigate her symptoms.  She has undergone extensive testing including MRIs and nerve biopsy which confirmed small fiber neuropathy.  Today, she***  ROS: ***  Studies Reviewed: .        *** Risk Assessment/Calculations:   {Does this patient have ATRIAL FIBRILLATION?[phone]} No BP recorded.  {Refresh Note OR Click here to enter BP  :1}***       Physical Exam:   VS:  There were no vitals taken for this  visit.   Wt Readings from Last 3 Encounters:  10/25/22 144 lb 9.6 oz (65.6 kg)  09/26/22 142 lb 3.2 oz (64.5 kg)  08/08/22 142 lb 3.2 oz (64.5 kg)    GEN: Well nourished, well developed in no acute distress NECK: No JVD; No carotid bruits CARDIAC: ***RRR, no murmurs, rubs, gallops RESPIRATORY:  Clear to auscultation without rales, wheezing or rhonchi  ABDOMEN: Soft, non-tender, non-distended EXTREMITIES:  No edema; No deformity   ASSESSMENT AND PLAN: .   Orthostatic hypotension Hyperemia Type 2 diabetes mellitus Frequent falls    {Are you ordering a CV Procedure (e.g. stress test, cath, DCCV, TEE, etc)?   Press F2        :[id number]}  Dispo: ***  Signed, Sharlene Dory, PA-C

## 2023-01-01 ENCOUNTER — Ambulatory Visit: Payer: Managed Care, Other (non HMO) | Admitting: Physician Assistant

## 2023-01-01 DIAGNOSIS — E119 Type 2 diabetes mellitus without complications: Secondary | ICD-10-CM

## 2023-01-01 DIAGNOSIS — R296 Repeated falls: Secondary | ICD-10-CM

## 2023-01-01 DIAGNOSIS — I951 Orthostatic hypotension: Secondary | ICD-10-CM

## 2023-01-01 DIAGNOSIS — R6889 Other general symptoms and signs: Secondary | ICD-10-CM

## 2023-01-29 ENCOUNTER — Encounter: Payer: Self-pay | Admitting: Neurology

## 2023-01-29 ENCOUNTER — Ambulatory Visit (INDEPENDENT_AMBULATORY_CARE_PROVIDER_SITE_OTHER): Payer: Managed Care, Other (non HMO) | Admitting: Neurology

## 2023-01-29 VITALS — BP 129/86 | HR 97 | Ht 62.0 in | Wt 137.0 lb

## 2023-01-29 DIAGNOSIS — R296 Repeated falls: Secondary | ICD-10-CM | POA: Diagnosis not present

## 2023-01-29 DIAGNOSIS — R42 Dizziness and giddiness: Secondary | ICD-10-CM | POA: Diagnosis not present

## 2023-01-29 DIAGNOSIS — G629 Polyneuropathy, unspecified: Secondary | ICD-10-CM | POA: Diagnosis not present

## 2023-01-29 MED ORDER — VENLAFAXINE HCL ER 75 MG PO CP24: ORAL_CAPSULE | ORAL | 3 refills | Status: DC

## 2023-01-29 NOTE — Patient Instructions (Signed)
Good to see you.  Increase Venlafaxine XR to 75mg  every night. With your current prescription for Venlafaxine 37.5mg : take 2 capsules every night. Once this bottle is finished, your new bottle will be Venlafaxine 75mg : take 1 capsule every night  2. Reduce Escitalopram 20mg : Take 1/2 tablet every night  3. Referral will be re-sent to the [facility] disorders clinic. If you don't hear from them in 2 weeks, please let us know  4. Continue home PT balance exercises  5. Follow-up in 4 months, call for any changes

## 2023-01-29 NOTE — Progress Notes (Signed)
NEUROLOGY FOLLOW UP OFFICE NOTE  Lindsay Rios [id number] Nov 07, 1965  HISTORY OF PRESENT ILLNESS: I had the pleasure of seeing Lindsay Rios in follow-up in the neurology clinic on 01/29/2023.  The patient was last seen 3 months ago for dizziness. She is alone in the office today. Records and images were personally reviewed where available.  She has had an extensive evaluation with Dr. Terrace Arabia with unremarkable brain MRI, cervical CT, MRI thoracic/lumbar spine, and EMG/NCV showing mild radiculopathy. Skin biopsy showed small fiber neuropathy. Neuropathy bloodwork in 10/2021 was normal except for an HbA1c of 7.7. She did physical therapy with goals partially met, advised to use an assistive device when walking. 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 She is on Venlafaxine XR 37.5mg  at bedtime without side effects. She has not noticed much change with the constant dizziness but then states it does not seem to be as bad. She continues to have frequent falls and get off balance easily, the other day she dropped clothes and as she bent to pick them up, she fell head first. She has neck and back pain, her hands and feet feel weaker, and there are occasional paresthesias in hands and feet. She denies any headaches. She has noticed changes in near vision and sees her eye doctor soon. Her last HbA1c was around 7, she sees Endo next month. She was told by her cardiologist to reduce BP medication to 1/2 tablet, however when she checked the bottle and monitored BP at home, she decided to continue to 1 tablet daily. She has not heard back from [facility]. She is also on Escitalopram 20mg  at bedtime and denies any issues taking it with Venlafaxine. She has had bouts of depression with her father's death anniversary.    History on Initial  Assessment 07/12/2022: This is a 57 year old left-handed woman with a history of hypertension, DM2, hyperlipidemia, breast cancer, OSA on CPAP, osteopenia, cervical spondylosis with myelopathy and radiculopathy, ADD, presenting for second opinion regarding dizziness. She was evaluated by neurologist Dr. Terrace Arabia in January 2024 for similar symptoms. She reports symptoms started a couple of years ago but have gotten worse. She describes difficulty with balance, she has to hold on to walls when getting up. When she falls, she would fall backwards and loses strength in both legs and arms. When supine, the dizziness may have a spinning component. If she turns or moves around a lot, there is also some spinning. She had seen ENT and was told it is not her inner ear. When she stood up, her gait is off. She describes a constant sensation of dizziness but worse when she stands up and feels she cannot walk. She states she falls constantly. She bent to pick up a dropped spoon last month and fell backwards, hitting her head on the cabinet. She cannot wear any type of heels due to balance issues. If she touches something with her foot, it may throw her off balance. She denies any presyncopal sensation, no loss of consciousness. No staring episodes or confusion. No headaches, diplopia, dysarthria/dysphagia (but has noticed she get choked more when drinking from a straw), hearing loss, or tinnitus. She has numbness in her fingers and toes. HbA1c last month was 6.9. She has bowel and bladder issues and has taken some of her father's Myrbetriq which helped her some. She also takes Imodium every  other day. No perineal numbness. She has neck and back pain. Neck injections did not help. She did PT twice a week a year ago with [facility] but they were charging $50 for each visit and it was cost prohibitive. She was started on Lexapro in January 2023, she has not noticed much change with mood.  Diagnostic Data available: Vestibular testing  July 2023 at the [facility] showed no evidence of peripheral vestibular deficit, no evidence of benign positional vertigo.  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   EMG/NCV at [facility] in 10/2021 which was mildly abnormal with evidence of chronic right C7-8 radiculopathy, no evidence of active process. There is also evidence of mild bilateral L4 radiculopathy, no evidence of large fiber peripheral neuropathy or focal neuropathy.   She was seen by neurosurgeon Dr. Conchita Paris in 09/2021 for C5-6 broad-based disc osteophyte complex with right greater than left neuroforaminal stenosis, mild spinal stenosis. Not a surgical candidate.  At Dr. Zannie Cove office, she was noted to have orthostatic tachycardia and hypotension, symptomatic after standing. Per notes, Sitting down 129/88, H 118; Standing up 106/69, HR 123; Standing up one minute 115/74, HR 141.  MRI thoracic and lumbar spine 01/2022: spinal cord appears normal. There were degenerative changes at several levels, mild to moderate left lateral recess stenosis and borderline spinal stenosis at L4-5.   CT cervical spine 01/2022: Degenerative disc disease is greatest at C5-6 and C6-7.There is a mild bilateral foraminal stenosis at C5-6.  Skin biopsy 07/2022: significantly reduced epidermal nerve fiber density on the left calf, consistent with small fiber neuropathy   PAST MEDICAL HISTORY: Past Medical History:  Diagnosis Date   ADD (attention deficit disorder)    Allergy    Depression    Ejection fraction    Gestational diabetes 1999; 2002   History of chicken pox    Human papilloma virus    High Risk    Hyperlipidemia    Hypertension    Numbness and tingling    Osteopenia    Pneumonia 2012   Sleep apnea with use of continuous positive airway pressure (CPAP)    Type II diabetes mellitus (HCC)    "borderline;  I'm on Metformin"    MEDICATIONS: Current Outpatient Medications on File Prior to Visit  Medication Sig Dispense Refill   amphetamine-dextroamphetamine (ADDERALL XR) 20 MG 24 hr capsule Take 20 mg by mouth daily.     aspirin 81 MG chewable tablet Chew 81 mg by mouth daily.     cetirizine (ZYRTEC) 10 MG tablet Take 10 mg by mouth daily.     Cholecalciferol (PA VITAMIN D-3 GUMMY PO) Chew one (1) gummy by mouth daily.     clonazePAM (KLONOPIN) 1 MG tablet Take 1 mg by mouth at bedtime.     empagliflozin (JARDIANCE) 25 MG TABS tablet Take 25 mg by mouth daily.     escitalopram (LEXAPRO) 20 MG tablet Take 20 mg by mouth daily.     ezetimibe (ZETIA) 10 MG tablet Take 1 tablet by mouth daily.     lisinopril-hydrochlorothiazide (ZESTORETIC) 20-25 MG tablet Take 0.5 tablets by mouth daily. Take 1/2 tablet daily     loperamide (IMODIUM) 2 MG capsule Take 1 capsule (2 mg total) by mouth 4 (four) times daily as needed for diarrhea or loose stools. 12 capsule 0   mirabegron ER (MYRBETRIQ) 50 MG TB24 tablet Take 50 mg by mouth daily.  Multiple Vitamins-Minerals (ALIVE WOMENS GUMMY PO) Chew one (1) gummy by mouth daily.     OZEMPIC, 0.25 OR 0.5 MG/DOSE, 2 MG/3ML SOPN Inject 0.25 mg into the skin once a week.     rosuvastatin (CRESTOR) 40 MG tablet Take 40 mg by mouth daily.     venlafaxine XR (EFFEXOR-XR) 37.5 MG 24 hr capsule Take 1 capsule every night 30 capsule 6   VIBERZI 75 MG TABS Take 1 tablet by mouth 2 (two) times daily.     Current Facility-Administered Medications on File Prior to Visit  Medication Dose Route Frequency Provider Last Rate Last Admin   0.9 %  sodium chloride infusion  500 mL Intravenous Once Meryl Dare, MD        ALLERGIES: Allergies  Allergen Reactions   Other Nausea Only and Other (See Comments)    REACTION: swelling under skin after shots Treseba    Sulfa Antibiotics Nausea Only    REACTION: nausea   Exenatide     Other reaction(s): dizzy, upset stomach    Metformin Hcl     Other reaction(s): GI Upset (intolerance), stomach upset    FAMILY HISTORY: Family History  Problem Relation Age of Onset   Hyperlipidemia Mother    Hyperlipidemia Father    Hypertension Father    Diabetes Father    Heart disease Father    Stroke Father    Cancer Maternal Grandmother        Colon Cancer   Alzheimer's disease Maternal Grandmother    Colon cancer Maternal Grandmother    Heart disease Brother 2       Myocardial Infarction   Breast cancer Paternal Aunt    Esophageal cancer Neg Hx    Stomach cancer Neg Hx    Rectal cancer Neg Hx     SOCIAL HISTORY: Social History   Socioeconomic History   Marital status: Married    Spouse name: Arlys John (Separated)    Number of children: 2   Years of education: 14   Highest education level: Not on file  Occupational History   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   Occupation: Full- Time Student    Education: [employer] (C[employer] Program)    Tobacco Use: She has never smoked.     Alcohol Use:  Rarely   Drug Use:  None   Diet:  Regular   Exercise:  None   Hobbies: Shopping      Left handed    Social Drivers of Corporate investment banker Strain: Not on file  Food Insecurity: Not on file  Transportation Needs: Not on file  Physical Activity: Not on file  Stress: Not on file  Social Connections: Not on file  Intimate Partner Violence: Not on  file     PHYSICAL EXAM: Vitals:  01/29/23 1327  BP: 129/86  Pulse: 97  SpO2: 96%   General: No acute distress Head:  Normocephalic/atraumatic Skin/Extremities: No rash, no edema Neurological Exam: alert and awake. No aphasia or dysarthria. Fund of knowledge is appropriate. Attention and concentration are normal.   Cranial nerves: Pupils equal, round. Extraocular movements intact with no nystagmus. Visual fields full.  No facial asymmetry.  Motor: Bulk and tone normal, muscle strength 5/5 throughout with no pronator drift. Reflexes +2 throughout.  Finger to nose testing intact.  Gait slow and cautious with cane, no ataxia.    IMPRESSION: This is a 57 yo LH woman with a history of hypertension, DM2, hyperlipidemia, breast cancer, OSA on CPAP, osteopenia, cervical spondylosis and radiculopathy, ADD, neuropathy, with dizziness and frequent falls. Etiology unclear, she has had extensive evaluation with imaging of brain and spinal cord, no acute changes. EMG/NCV showed radiculopathy which would also not explain symptoms. Skin biopsy did show small fiber neuropathy. She describes a constant sensation of dizziness, worse when standing. 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 She will reduce Lexapro to 10mg  at bedtime. Continue home PT exercises. Proceed with second opinion from [facility]. Follow-up in 4 months, call for any changes.   Thank you for allowing me to participate in her care.  Please do not hesitate to call for any questions or concerns.    Patrcia Dolly, M.D.   [id number] [place], [place] Fax [phone]

## 2023-02-15 ENCOUNTER — Encounter: Payer: Self-pay | Admitting: Gastroenterology

## 2023-02-15 ENCOUNTER — Ambulatory Visit: Payer: Managed Care, Other (non HMO) | Admitting: Gastroenterology

## 2023-02-15 ENCOUNTER — Telehealth: Payer: Self-pay

## 2023-02-15 ENCOUNTER — Other Ambulatory Visit (INDEPENDENT_AMBULATORY_CARE_PROVIDER_SITE_OTHER): Payer: Managed Care, Other (non HMO)

## 2023-02-15 VITALS — BP 116/76 | HR 91 | Ht 62.0 in | Wt 135.0 lb

## 2023-02-15 DIAGNOSIS — K580 Irritable bowel syndrome with diarrhea: Secondary | ICD-10-CM

## 2023-02-15 DIAGNOSIS — Z800 Family history of malignant neoplasm of digestive organs: Secondary | ICD-10-CM

## 2023-02-15 DIAGNOSIS — R634 Abnormal weight loss: Secondary | ICD-10-CM

## 2023-02-15 LAB — COMPREHENSIVE METABOLIC PANEL
ALT: 35 U/L (ref 0–35)
AST: 27 U/L (ref 0–37)
Albumin: 5 g/dL (ref 3.5–5.2)
Alkaline Phosphatase: 64 U/L (ref 39–117)
BUN: 15 mg/dL (ref 6–23)
CO2: 31 meq/L (ref 19–32)
Calcium: 10.2 mg/dL (ref 8.4–10.5)
Chloride: 100 meq/L (ref 96–112)
Creatinine, Ser: 0.64 mg/dL (ref 0.40–1.20)
GFR: 99.42 mL/min (ref >60.00)
Glucose, Bld: 138 mg/dL — ABNORMAL HIGH (ref 70–99)
Potassium: 3.8 meq/L (ref 3.5–5.1)
Sodium: 141 meq/L (ref 135–145)
Total Bilirubin: 0.9 mg/dL (ref 0.2–1.2)
Total Protein: 7.7 g/dL (ref 6.0–8.3)

## 2023-02-15 LAB — CBC WITH DIFFERENTIAL/PLATELET
Basophils Absolute: 0.1 10*3/uL (ref 0.0–0.1)
Basophils Relative: 0.9 % (ref 0.0–3.0)
Eosinophils Absolute: 0.1 10*3/uL (ref 0.0–0.7)
Eosinophils Relative: 2.3 % (ref 0.0–5.0)
HCT: 49.8 % — ABNORMAL HIGH (ref 36.0–46.0)
Hemoglobin: 16.9 g/dL — ABNORMAL HIGH (ref 12.0–15.0)
Lymphocytes Relative: 30.6 % (ref 12.0–46.0)
Lymphs Abs: 2 10*3/uL (ref 0.7–4.0)
MCHC: 34 g/dL (ref 30.0–36.0)
MCV: 83.8 fL (ref 78.0–100.0)
Monocytes Absolute: 0.4 10*3/uL (ref 0.1–1.0)
Monocytes Relative: 6.5 % (ref 3.0–12.0)
Neutro Abs: 3.9 10*3/uL (ref 1.4–7.7)
Neutrophils Relative %: 59.7 % (ref 43.0–77.0)
Platelets: 205 10*3/uL (ref 150.0–400.0)
RBC: 5.94 Mil/uL — ABNORMAL HIGH (ref 3.87–5.11)
RDW: 13.2 % (ref 11.5–15.5)
WBC: 6.6 10*3/uL (ref 4.0–10.5)

## 2023-02-15 LAB — TSH: TSH: 1.2 u[IU]/mL (ref 0.35–5.50)

## 2023-02-15 LAB — C-REACTIVE PROTEIN: CRP: <1 mg/dL (ref 0.5–20.0)

## 2023-02-15 LAB — SEDIMENTATION RATE: Sed Rate: 16 mm/h (ref 0–30)

## 2023-02-15 MED ORDER — SUFLAVE 178.7 G PO SOLR: 1.0000 | Freq: Once | ORAL | 0 refills | Status: AC

## 2023-02-15 NOTE — Progress Notes (Signed)
Chief Complaint: IBS  Referring Provider:  Milus Height, PA      ASSESSMENT AND PLAN;   #1. IBS-D. Doing better on Viberzi  #2. FH CRC (MGM)-patient quite concerned about colon cancer.  Plan: -Continue viberzi -Colon  -CBC, CMP, TSH, celiac serology, CRP, sed rate.  -Kegel's exercises.  If still with problems especially incontinence, pelvic PT   Discussed risks & benefits of colonoscopy. Risks including rare perforation req laparotomy, bleeding after bx/polypectomy req blood transfusion, rarely missing neoplasms, risks of anesthesia/sedation, rare risk of damage to internal organs. Benefits outweigh the risks. Patient agrees to proceed. All the questions were answered. Pt consents to proceed.  HPI:    Lindsay Rios is a 58 y.o. female  With multiple medical problems including DM (on ozempic), HLD, HTN, ADD, OSA, anxiety/depression, postural hypotension, peripheral neuropathy, gait abnormality (has appointment with neurology)  with functional diarrhea and pelvic floor dysfunction.    Very much concerned about colon cancer-due to family history.  Maternal grandmother with colon cancer at a earlier age.   Longstanding H/O postprandial diarrhea. 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 The patient has been on Viberzi, which has helped control the diarrhea, but it has also led to constipation. They have tried to manage this by reducing the dosage and frequency of the medication.  The patient has tried multiple medications for their diarrhea in the past, including Bentyl and Imodium, but Viberzi has been the most effective. However, they express concern about the constipation and fear of having accidents in public, which has limited their social activities.  The patient also reports urinary incontinence and balance issues, which have led to multiple falls. They are scheduled to see a  specialist for these issues. They express frustration with their health, stating that they feel these issues should not be happening at their age.  The patient has a family history of colon cancer and celiac disease. They have not noticed any blood in their stool, but they do report passing small, hard stools when constipated. They have lost weight recently, but they are also on Ozempic for their diabetes.  The patient's diet varies, but they have noticed that fried foods and carbonated drinks tend to exacerbate their diarrhea. They try to avoid these foods and mostly drink water. They have not noticed a significant difference in their symptoms when eating at home versus eating out.  The patient is also on medication for anxiety and depression, which they believe may be contributing to their balance issues. They are currently seeking disability due to their health issues.         Son with celiac  Wt Readings from Last 3 Encounters:  02/15/23 135 lb (61.2 kg)  01/29/23 137 lb (62.1 kg)  10/25/22 144 lb 9.6 oz (65.6 kg)   Past GI workup:  Colonoscopy 03/14/2017 ([facility]) - The entire examined colon is normal on direct and retroflexion views. Random biopsies obtained. - The examined portion of the ileum was normal. -Bx: neg for microscopic colitis -Repeat 10 years.  Earlier, if with any problems.  Anal manometry 03/2015 for fecal incontinence -Weak external sphincter -Dyssynergic defecation -Refer to pelvic PT  Colonoscopy 01/2012: neg. Rpt 5 yrs      Past Medical History:  Diagnosis Date   ADD (attention deficit disorder)    Allergy    Depression    Ejection fraction    Gestational diabetes  1999; 2002   History of chicken pox    Human papilloma virus    High Risk    Hyperlipidemia    Hypertension    Numbness and tingling    Osteopenia    Pneumonia 2012   Sleep apnea with use of continuous positive airway pressure (CPAP)    Type II diabetes mellitus (HCC)    "borderline; I'm  on Metformin"    Past Surgical History:  Procedure Laterality Date   ANAL RECTAL MANOMETRY N/A 03/17/2015   Procedure: ANO RECTAL MANOMETRY;  Surgeon: Napoleon Form, MD;  Location: [facility];  Service: Endoscopy;  Laterality: N/A;   BREAST LUMPECTOMY WITH RADIOACTIVE SEED LOCALIZATION Right 06/05/2019   Procedure: [name];  Surgeon: Abigail Miyamoto, MD;  Location: [facility];  Service: General;  Laterality: Right;   BREAST RECONSTRUCTION WITH PLACEMENT OF TISSUE EXPANDER AND FLEX HD (ACELLULAR HYDRATED DERMIS) Bilateral 04/19/2020   Procedure: IMMEDIATE BILATERAL BREAST RECONSTRUCTION WITH PLACEMENT OF TISSUE EXPANDER AND FLEX HD (ACELLULAR HYDRATED DERMIS);  Surgeon: Peggye Form, DO;  Location: [facility];  Service: Plastics;  Laterality: Bilateral;   LEFT HEART CATHETERIZATION WITH CORONARY ANGIOGRAM N/A 12/22/2013   Procedure: LEFT HEART CATHETERIZATION WITH CORONARY ANGIOGRAM;  Surgeon: Corky Crafts, MD;  Location: [facility];  Service: Cardiovascular;  Laterality: N/A;   NIPPLE SPARING MASTECTOMY Bilateral 04/19/2020   Procedure: BILATERAL NIPPLE SPARING MASTECTOMY;  Surgeon: Abigail Miyamoto, MD;  Location: [facility];  Service: General;  Laterality: Bilateral;   REMOVAL OF TISSUE EXPANDER AND PLACEMENT OF IMPLANT Bilateral 08/12/2020   Procedure: REMOVAL OF TISSUE EXPANDER AND PLACEMENT OF IMPLANT;  Surgeon: Peggye Form, DO;  Location:  SURGERY CENTER;  Service: Plastics;  Laterality: Bilateral;  90 min   SHOULDER ARTHROSCOPY W/ ROTATOR CUFF REPAIR Right 1990's   VAGINAL HYSTERECTOMY  2013   HPV/Cervical Dysplasia; partial    Family History  Problem Relation Age of Onset   Hyperlipidemia Mother    Hyperlipidemia Father    Hypertension Father    Diabetes Father    Heart disease Father    Stroke Father    Heart disease Brother 28        Myocardial Infarction   Cancer Maternal Grandmother        Colon Cancer   Alzheimer's disease Maternal Grandmother    Colon cancer Maternal Grandmother    Breast cancer Paternal Aunt    Esophageal cancer Neg Hx    Stomach cancer Neg Hx    Rectal cancer Neg Hx     Social History   Tobacco Use   Smoking status: Never   Smokeless tobacco: Never  Vaping Use   Vaping status: Never Used  Substance Use Topics   Alcohol use: No    Alcohol/week: 0.0 standard drinks of alcohol    Comment: rare   Drug use: No    Current Outpatient Medications  Medication Sig Dispense Refill   amphetamine-dextroamphetamine (ADDERALL XR) 20 MG 24 hr capsule Take 20 mg by mouth daily.     aspirin 81 MG chewable tablet Chew 81 mg by mouth daily.     cetirizine (ZYRTEC) 10 MG tablet Take 10 mg by mouth daily.     Cholecalciferol (PA VITAMIN D-3 GUMMY PO) Chew one (1) gummy by mouth daily.     clonazePAM (KLONOPIN) 1 MG tablet Take 1 mg by mouth at bedtime.     empagliflozin (JARDIANCE) 25 MG  TABS tablet Take 25 mg by mouth daily.     escitalopram (LEXAPRO) 20 MG tablet Take 20 mg by mouth daily. Take 1/2 tablet every night     ezetimibe (ZETIA) 10 MG tablet Take 1 tablet by mouth daily.     lisinopril-hydrochlorothiazide (ZESTORETIC) 20-25 MG tablet Take 0.5 tablets by mouth daily. Take 1/2 tablet daily     loperamide (IMODIUM) 2 MG capsule Take 1 capsule (2 mg total) by mouth 4 (four) times daily as needed for diarrhea or loose stools. 12 capsule 0   mirabegron ER (MYRBETRIQ) 50 MG TB24 tablet Take 50 mg by mouth daily.     Multiple Vitamins-Minerals (ALIVE WOMENS GUMMY PO) Chew one (1) gummy by mouth daily.     OZEMPIC, 0.25 OR 0.5 MG/DOSE, 2 MG/3ML SOPN Inject 0.25 mg into the skin once a week.     rosuvastatin (CRESTOR) 40 MG tablet Take 40 mg by mouth daily.     venlafaxine XR (EFFEXOR XR) 75 MG 24 hr capsule Take 1 capsule at night 90 capsule 3   VIBERZI 75 MG TABS Take 1 tablet by mouth 2 (two) times  daily.     Current Facility-Administered Medications  Medication Dose Route Frequency Provider Last Rate Last Admin   0.9 %  sodium chloride infusion  500 mL Intravenous Once Meryl Dare, MD        Allergies  Allergen Reactions   Other Nausea Only and Other (See Comments)    REACTION: swelling under skin after shots Treseba    Sulfa Antibiotics Nausea Only    REACTION: nausea   Exenatide     Other reaction(s): dizzy, upset stomach   Metformin Hcl     Other reaction(s): GI Upset (intolerance), stomach upset    Review of Systems:  Constitutional: Denies fever, chills, diaphoresis, appetite change and has fatigue.  HEENT: Has allergies Respiratory: Denies SOB, DOE, cough, chest tightness,  and wheezing.   Cardiovascular: Denies chest pain, palpitations and leg swelling.  Genitourinary: Denies dysuria, urgency, frequency, hematuria, flank pain and difficulty urinating.  Musculoskeletal: Denies myalgias, back pain, joint swelling, arthralgias and has gait problem.  Skin: No rash.  Neurological: Denies dizziness, seizures, syncope, weakness, light-headedness, numbness and headaches.  Hematological: Denies adenopathy. Easy bruising, personal or family bleeding history  Psychiatric/Behavioral: Has anxiety or depression     Physical Exam:    BP 116/76   Pulse 91   Ht 5\' 2"  (1.575 m)   Wt 135 lb (61.2 kg)   BMI 24.69 kg/m  Wt Readings from Last 3 Encounters:  02/15/23 135 lb (61.2 kg)  01/29/23 137 lb (62.1 kg)  10/25/22 144 lb 9.6 oz (65.6 kg)   Constitutional:  Well-developed, in no acute distress. Psychiatric: Normal mood and affect. Behavior is normal. HEENT: Pupils normal.  Conjunctivae are normal. No scleral icterus. Cardiovascular: Normal rate, regular rhythm. No edema Pulmonary/chest: Effort normal and breath sounds normal. No wheezing, rales or rhonchi. Abdominal: Soft, nondistended. Nontender. Bowel sounds active throughout. There are no masses palpable. No  hepatomegaly. Rectal: Deferred Neurological: Alert and oriented to person place and time. Skin: Skin is warm and dry. No rashes noted.  Data Reviewed: I have personally reviewed following labs and imaging studies  CBC:    Latest Ref Rng & Units 01/31/2022    3:05 PM 04/01/2021    7:50 AM 03/06/2021    9:26 AM  CBC  WBC 4.0 - 10.5 K/uL 8.9  8.2  6.2   Hemoglobin 12.0 -  15.0 g/dL 16.1  09.6  04.5   Hematocrit 36.0 - 46.0 % 48.4  43.0  44.0   Platelets 150 - 400 K/uL 229  171  210     CMP:    Latest Ref Rng & Units 01/31/2022    3:05 PM 04/01/2021    7:50 AM 03/06/2021    9:26 AM  CMP  Glucose 70 - 99 mg/dL 409  811  914   BUN 6 - 20 mg/dL 20  11  13    Creatinine 0.44 - 1.00 mg/dL 7.82  9.56  2.13   Sodium 135 - 145 mmol/L 140  140  141   Potassium 3.5 - 5.1 mmol/L 4.0  3.5  3.8   Chloride 98 - 111 mmol/L 101  106  107   CO2 22 - 32 mmol/L 27  23  26    Calcium 8.9 - 10.3 mg/dL 08.6  9.2  9.2   Total Protein 6.5 - 8.1 g/dL  7.0    Total Bilirubin 0.3 - 1.2 mg/dL  0.5    Alkaline Phos 38 - 126 U/L  62    AST 15 - 41 U/L  40    ALT 0 - 44 U/L  40          Edman Circle, MD 02/15/2023, 9:39 AM  Cc: Milus Height, PA

## 2023-02-15 NOTE — Telephone Encounter (Signed)
Patient understands not to take the suflave and to do the miralax prep for her procedure. I left a voicemail with [facility] to cancel the appointment and told the patient if [facility] calls her to tell them that she doesn't need the prep. She voiced understanding. Instructions mailed

## 2023-02-15 NOTE — Patient Instructions (Addendum)
_______________________________________________________  If your blood pressure at your visit was 140/90 or greater, please contact your primary care physician to follow up on this.  _______________________________________________________  If you are age 56 or older, your body mass index should be between 23-30. Your Body mass index is 24.69 kg/m. If this is out of the aforementioned range listed, please consider follow up with your Primary Care Provider.  If you are age 49 or younger, your body mass index should be between 19-25. Your Body mass index is 24.69 kg/m. If this is out of the aformentioned range listed, please consider follow up with your Primary Care Provider.   ________________________________________________________  The [facility] providers would like to encourage you to use [facility] to communicate with providers for non-urgent requests or questions.  Due to long hold times on the telephone, sending your provider a message by [facility] may be a faster and more efficient way to get a response.  Please allow 48 business hours for a response.  Please remember that this is for non-urgent requests.  _______________________________________________________  Your provider has requested that you go to the basement level for lab work before leaving today. Press "B" on the elevator. The lab is located at the first door on the left as you exit the elevator.  Continue Viberzi  You will receive your bowel preparation through [facility], which ensures the lowest copay and home delivery, with outreach via text or call from an [phone] number. Please respond promptly to avoid rescheduling. If you are interested in alternative options or have any questions please contact them at [phone]  Your Provider Has Sent Your Bowel Prep Regimen To [facility] What to expect. [facility] will contact you to verify your information and collect your copay, if applicable. Enjoy the comfort of your home while we  deliver your prescription to you, free of any shipping charges. Fast, FREE delivery or shipping. [facility] accepts all major insurance benefits and applies discounts & coupons  Have additional questions? [facility]'s patient care team is always here to help.  Chat: [email or web address] Call: [phone] Email: [email or web address] NCPDP: [id number] How will we contact you? Welcome Phone call  a Welcome text and a Checkout link in a text Texts you receive from [phone] Are Not Spam.   *To set up delivery, you must complete the checkout process via link or speak to one of our patient care representatives. If we are unable to reach you, your prescription may be delayed.   You have been scheduled for a colonoscopy. Please follow written instructions given to you at your visit today.   Please pick up your prep supplies at the pharmacy within the next 1-3 days.  If you use inhalers (even only as needed), please bring them with you on the day of your procedure.  DO NOT TAKE 7 DAYS PRIOR TO TEST- Trulicity (dulaglutide) Ozempic, Wegovy (semaglutide) Mounjaro (tirzepatide) Bydureon Bcise (exanatide extended release)  DO NOT TAKE 1 DAY PRIOR TO YOUR TEST Rybelsus (semaglutide) Adlyxin (lixisenatide) Victoza (liraglutide) Byetta (exanatide) ___________________________________________________________________________  Thank you,  Dr. Lynann Bologna

## 2023-02-16 ENCOUNTER — Other Ambulatory Visit: Payer: Self-pay

## 2023-02-16 DIAGNOSIS — D582 Other hemoglobinopathies: Secondary | ICD-10-CM

## 2023-02-16 NOTE — Progress Notes (Signed)
Orders placed for patient. Made aware to come to come next week for labs

## 2023-02-19 ENCOUNTER — Other Ambulatory Visit (INDEPENDENT_AMBULATORY_CARE_PROVIDER_SITE_OTHER): Payer: Managed Care, Other (non HMO)

## 2023-02-19 DIAGNOSIS — D582 Other hemoglobinopathies: Secondary | ICD-10-CM | POA: Diagnosis not present

## 2023-02-19 LAB — IBC + FERRITIN
Ferritin: 81.3 ng/mL (ref 10.0–291.0)
Iron: 106 ug/dL (ref 42–145)
Saturation Ratios: 28.3 % (ref 20.0–50.0)
TIBC: 375.2 ug/dL (ref 250.0–450.0)
Transferrin: 268 mg/dL (ref 212.0–360.0)

## 2023-02-21 LAB — CELIAC PANEL 10
Antigliadin Abs, IgA: 9 U (ref 0–19)
Gliadin IgG: 2 U (ref 0–19)
IgA/Immunoglobulin A, Serum: 308 mg/dL (ref 87–352)
Tissue Transglut Ab: 3 U/mL (ref 0–5)

## 2023-02-28 LAB — HEMOCHROMATOSIS DNA-PCR(C282Y,H63D)

## 2023-03-02 ENCOUNTER — Telehealth: Payer: Self-pay | Admitting: Gastroenterology

## 2023-03-02 NOTE — Telephone Encounter (Signed)
Spoke with patient. Refer back to lab result note 02/19/23.

## 2023-03-02 NOTE — Telephone Encounter (Signed)
Patient called and stated that she received a call from [place]. Patient is requesting for [name] to call her back. Please advise.

## 2023-03-22 ENCOUNTER — Telehealth: Payer: Self-pay | Admitting: Neurology

## 2023-03-22 NOTE — Telephone Encounter (Signed)
Pt. Called and would like to discuss Disability before her May appt, could not find sooner appt

## 2023-03-23 NOTE — Telephone Encounter (Signed)
Pt called informed that Dr Karel Jarvis will do fill out her paperwork for her to continue her physical therapy

## 2023-03-23 NOTE — Telephone Encounter (Signed)
I can fill out the paperwork with the information from physical therapy, she will need to continue physical therapy. Pls have her send paperwork again, thanks

## 2023-03-26 ENCOUNTER — Emergency Department (HOSPITAL_BASED_OUTPATIENT_CLINIC_OR_DEPARTMENT_OTHER): Payer: Managed Care, Other (non HMO) | Admitting: Radiology

## 2023-03-26 ENCOUNTER — Emergency Department (HOSPITAL_BASED_OUTPATIENT_CLINIC_OR_DEPARTMENT_OTHER)
Admission: EM | Admit: 2023-03-26 | Discharge: 2023-03-26 | Disposition: A | Discharge status: Home / Self Care | Payer: Managed Care, Other (non HMO) | Attending: Emergency Medicine | Admitting: Emergency Medicine

## 2023-03-26 ENCOUNTER — Other Ambulatory Visit: Payer: Self-pay

## 2023-03-26 ENCOUNTER — Encounter (HOSPITAL_BASED_OUTPATIENT_CLINIC_OR_DEPARTMENT_OTHER): Payer: Self-pay | Admitting: Emergency Medicine

## 2023-03-26 ENCOUNTER — Emergency Department (HOSPITAL_BASED_OUTPATIENT_CLINIC_OR_DEPARTMENT_OTHER): Payer: Managed Care, Other (non HMO)

## 2023-03-26 DIAGNOSIS — W19XXXA Unspecified fall, initial encounter: Secondary | ICD-10-CM | POA: Insufficient documentation

## 2023-03-26 DIAGNOSIS — Y92099 Unspecified place in other non-institutional residence as the place of occurrence of the external cause: Secondary | ICD-10-CM | POA: Diagnosis not present

## 2023-03-26 DIAGNOSIS — W228XXA Striking against or struck by other objects, initial encounter: Secondary | ICD-10-CM | POA: Diagnosis not present

## 2023-03-26 DIAGNOSIS — E876 Hypokalemia: Secondary | ICD-10-CM | POA: Diagnosis not present

## 2023-03-26 DIAGNOSIS — R519 Headache, unspecified: Secondary | ICD-10-CM | POA: Diagnosis present

## 2023-03-26 DIAGNOSIS — E119 Type 2 diabetes mellitus without complications: Secondary | ICD-10-CM | POA: Diagnosis not present

## 2023-03-26 DIAGNOSIS — R079 Chest pain, unspecified: Secondary | ICD-10-CM | POA: Diagnosis not present

## 2023-03-26 LAB — CBC WITH DIFFERENTIAL/PLATELET
Abs Immature Granulocytes: 0.01 10*3/uL (ref 0.00–0.07)
Basophils Absolute: 0.1 10*3/uL (ref 0.0–0.1)
Basophils Relative: 1 %
Eosinophils Absolute: 0.1 10*3/uL (ref 0.0–0.5)
Eosinophils Relative: 1 %
HCT: 50.6 % — ABNORMAL HIGH (ref 36.0–46.0)
Hemoglobin: 17.7 g/dL — ABNORMAL HIGH (ref 12.0–15.0)
Immature Granulocytes: 0 %
Lymphocytes Relative: 31 %
Lymphs Abs: 2.5 10*3/uL (ref 0.7–4.0)
MCH: 28.4 pg (ref 26.0–34.0)
MCHC: 35 g/dL (ref 30.0–36.0)
MCV: 81.2 fL (ref 80.0–100.0)
Monocytes Absolute: 0.7 10*3/uL (ref 0.1–1.0)
Monocytes Relative: 8 %
Neutro Abs: 4.9 10*3/uL (ref 1.7–7.7)
Neutrophils Relative %: 59 %
Platelets: 217 10*3/uL (ref 150–400)
RBC: 6.23 MIL/uL — ABNORMAL HIGH (ref 3.87–5.11)
RDW: 12.5 % (ref 11.5–15.5)
WBC: 8.2 10*3/uL (ref 4.0–10.5)
nRBC: 0 % (ref 0.0–0.2)

## 2023-03-26 LAB — COMPREHENSIVE METABOLIC PANEL
ALT: 39 U/L (ref 0–44)
AST: 28 U/L (ref 15–41)
Albumin: 5 g/dL (ref 3.5–5.0)
Alkaline Phosphatase: 70 U/L (ref 38–126)
Anion gap: 13 (ref 5–15)
BUN: 17 mg/dL (ref 6–20)
CO2: 29 mmol/L (ref 22–32)
Calcium: 10.2 mg/dL (ref 8.9–10.3)
Chloride: 96 mmol/L — ABNORMAL LOW (ref 98–111)
Creatinine, Ser: 0.78 mg/dL (ref 0.44–1.00)
GFR, Estimated: >60 mL/min (ref >60)
Glucose, Bld: 146 mg/dL — ABNORMAL HIGH (ref 70–99)
Potassium: 2.8 mmol/L — ABNORMAL LOW (ref 3.5–5.1)
Sodium: 138 mmol/L (ref 135–145)
Total Bilirubin: 0.8 mg/dL (ref 0.0–1.2)
Total Protein: 7.8 g/dL (ref 6.5–8.1)

## 2023-03-26 LAB — MAGNESIUM: Magnesium: 2.4 mg/dL (ref 1.7–2.4)

## 2023-03-26 LAB — CBG MONITORING, ED: Glucose-Capillary: 123 mg/dL — ABNORMAL HIGH (ref 70–99)

## 2023-03-26 LAB — TROPONIN I (HIGH SENSITIVITY): Troponin I (High Sensitivity): <2 ng/L (ref <18)

## 2023-03-26 MED ORDER — METOCLOPRAMIDE HCL 5 MG/ML IJ SOLN
10.0000 mg | Freq: Once | INTRAMUSCULAR | Status: AC
  Administered 2023-03-26: 10 mg via INTRAVENOUS
  Filled 2023-03-26: qty 2

## 2023-03-26 MED ORDER — POTASSIUM CHLORIDE CRYS ER 20 MEQ PO TBCR: 40.0000 meq | EXTENDED_RELEASE_TABLET | Freq: Two times a day (BID) | ORAL | 0 refills | Status: DC

## 2023-03-26 MED ORDER — POTASSIUM CHLORIDE 20 MEQ PO PACK
40.0000 meq | PACK | ORAL | Status: AC
  Administered 2023-03-26: 40 meq via ORAL
  Filled 2023-03-26: qty 2

## 2023-03-26 MED ORDER — SODIUM CHLORIDE 0.9 % IV BOLUS
1000.0000 mL | Freq: Once | INTRAVENOUS | Status: AC
  Administered 2023-03-26: 1000 mL via INTRAVENOUS

## 2023-03-26 MED ORDER — DIPHENHYDRAMINE HCL 50 MG/ML IJ SOLN
25.0000 mg | Freq: Once | INTRAMUSCULAR | Status: AC
  Administered 2023-03-26: 25 mg via INTRAVENOUS
  Filled 2023-03-26: qty 1

## 2023-03-26 NOTE — Discharge Instructions (Addendum)
 Your potassium was low today, I prescribed you some potassium pills, to take, to replenish it.  You need to get your labs retaken, in a week, to get them rechecked, to make sure you do not need to be on [place] potassium replacement.  The cause of your headache may be secondary to the falls, or your cervical spine injury that occurred about a year or 2 ago.  Your chest pain, workup was reassuring, you should follow-up with your PCP in regards to this.

## 2023-03-26 NOTE — ED Triage Notes (Signed)
 Frequent falls at home, reports pain in back of head  Seeing neuro for off-balance Reports chest pain today Nausea

## 2023-03-26 NOTE — ED Provider Notes (Addendum)
 [place] [facility] AT [facility] Provider Note   CSN: [id number] Arrival date & time: 03/26/23  1648     History  Chief Complaint  Patient presents with   Chest Pain   Fall    Lindsay Rios is a 58 y.o. female, history of type 2 diabetes, neuropathy, recurrent falls, who presents to the ED secondary to chest pain, this been going on since 10 AM this morning.  She states the pain is achy, intermittent, and localized to the left breast.  She has no radiation of the pain, she has no shortness of breath, nausea, or vomiting.  Chest pain is a 5 out of 10, not associated with Hu, shortness of breath, or movement.  She has had this happen before, and was unclear what happened with this.  She reports the last for an hour at a time, and then will go away.  Also reports that she has had recurrent falls, she is currently seeing a neurologist, for these recurrent falls, but they do not want to know what is causing them.  She states she fell about 2 days ago, and ever since then she has has had a severe headache, but no nausea, vomiting, or changes in vision.  Also reports some neck pain.    Home Medications Prior to Admission medications   Medication Sig Start Date End Date Taking? Authorizing Provider  potassium chloride SA (KLOR-CON M) 20 MEQ tablet Take 2 tablets (40 mEq total) by mouth 2 (two) times daily for 3 days. 03/26/23 03/29/23 Yes Imir Brumbach L, PA  amphetamine-dextroamphetamine (ADDERALL XR) 20 MG 24 hr capsule Take 20 mg by mouth daily. 06/27/22   [provider]  aspirin 81 MG chewable tablet Chew 81 mg by mouth daily.    [provider]  cetirizine (ZYRTEC) 10 MG tablet Take 10 mg by mouth daily.    [provider]  Cholecalciferol (PA VITAMIN D-3 GUMMY PO) Chew one (1) gummy by mouth daily.    [provider]  clonazePAM (KLONOPIN) 1 MG tablet Take 1 mg by mouth at bedtime.    [provider]  empagliflozin  (JARDIANCE) 25 MG TABS tablet Take 25 mg by mouth daily.    [provider]  escitalopram (LEXAPRO) 20 MG tablet Take 20 mg by mouth daily. Take 1/2 tablet every night    [provider]  ezetimibe (ZETIA) 10 MG tablet Take 1 tablet by mouth daily. 09/05/22   [provider]  lisinopril-hydrochlorothiazide (ZESTORETIC) 20-25 MG tablet Take 0.5 tablets by mouth daily. Take 1/2 tablet daily    [provider]  loperamide (IMODIUM) 2 MG capsule Take 1 capsule (2 mg total) by mouth 4 (four) times daily as needed for diarrhea or loose stools. 04/01/21   Cheryll Cockayne, MD  mirabegron ER (MYRBETRIQ) 50 MG TB24 tablet Take 50 mg by mouth daily. 01/01/23   [provider]  Multiple Vitamins-Minerals (ALIVE WOMENS GUMMY PO) Chew one (1) gummy by mouth daily.    [provider]  OZEMPIC, 0.25 OR 0.5 MG/DOSE, 2 MG/3ML SOPN Inject 0.25 mg into the skin once a week. 05/30/22   [provider]  rosuvastatin (CRESTOR) 40 MG tablet Take 40 mg by mouth daily.    [provider]  venlafaxine XR (EFFEXOR XR) 75 MG 24 hr capsule Take 1 capsule at night 01/29/23   Van Clines, MD  VIBERZI 75 MG TABS Take 1 tablet by mouth 2 (two) times daily. 01/02/23  [provider]      Allergies    Other, Sulfa antibiotics, Exenatide, and Metformin hcl    Review of Systems   Review of Systems  Respiratory:  Negative for shortness of breath.   Cardiovascular:  Positive for chest pain.    Physical Exam Updated Vital Signs BP 116/72   Pulse 87   Temp 98.7 F (37.1 C)   Resp 16   SpO2 98%  Physical Exam Vitals and nursing note reviewed.  Constitutional:      General: She is not in acute distress.    Appearance: She is well-developed.  HENT:     Head: Normocephalic and atraumatic.  Eyes:     Conjunctiva/sclera: Conjunctivae normal.  Neck:     Comments: Tenderness to palpation of cervical spine, no step-offs noted.  Range of motion intact  with neck, no ecchymosis noted. Cardiovascular:     Rate and Rhythm: Normal rate and regular rhythm.     Heart sounds: No murmur heard. Pulmonary:     Effort: Pulmonary effort is normal. No respiratory distress.     Breath sounds: Normal breath sounds.  Abdominal:     Palpations: Abdomen is soft.     Tenderness: There is no abdominal tenderness.  Musculoskeletal:        General: No swelling.     Cervical back: Neck supple.  Skin:    General: Skin is warm and dry.     Capillary Refill: Capillary refill takes less than 2 seconds.  Neurological:     Mental Status: She is alert.  Psychiatric:        Mood and Affect: Mood normal.     ED Results / Procedures / Treatments   Labs (all labs ordered are listed, but only abnormal results are displayed) Labs Reviewed  CBC WITH DIFFERENTIAL/PLATELET - Abnormal; Notable for the following components:      Result Value   RBC 6.23 (*)    Hemoglobin 17.7 (*)    HCT 50.6 (*)    All other components within normal limits  COMPREHENSIVE METABOLIC PANEL - Abnormal; Notable for the following components:   Potassium 2.8 (*)    Chloride 96 (*)    Glucose, Bld 146 (*)    All other components within normal limits  CBG MONITORING, ED - Abnormal; Notable for the following components:   Glucose-Capillary 123 (*)    All other components within normal limits  MAGNESIUM  TROPONIN I (HIGH SENSITIVITY)    EKG None  Radiology CT Cervical Spine Wo Contrast Result Date: 03/26/2023 CLINICAL DATA:  Frequent falls. EXAM: CT CERVICAL SPINE WITHOUT CONTRAST TECHNIQUE: Multidetector CT imaging of the cervical spine was performed without intravenous contrast. Multiplanar CT image reconstructions were also generated. RADIATION DOSE REDUCTION: This exam was performed according to the departmental dose-optimization program which includes automated exposure control, adjustment of the mA and/or kV according to patient size and/or use of iterative reconstruction  technique. COMPARISON:  January 31, 2022 FINDINGS: Alignment: There is straightening of the normal cervical spine lordosis. Skull base and vertebrae: No acute fracture. No primary bone lesion or focal pathologic process. Soft tissues and spinal canal: No prevertebral fluid or swelling. No visible canal hematoma. Disc levels: Moderate to marked severity endplate sclerosis, anterior osteophyte formation and posterior bony spurring are seen at the level of C5-C6 and C6-C7. Moderate severity intervertebral disc space narrowing is also seen at these levels. Bilateral moderate to marked severity multilevel facet joint hypertrophy is noted. Upper chest: Negative. Other:  None. IMPRESSION: 1. No acute fracture or subluxation in the cervical spine. 2. Moderate to marked severity degenerative changes at the levels of C5-C6 and C6-C7. Electronically Signed   By: Aram Candela M.D.   On: 03/26/2023 21:15   CT Head Wo Contrast Result Date: 03/26/2023 CLINICAL DATA:  Frequent falls. EXAM: CT HEAD WITHOUT CONTRAST TECHNIQUE: Contiguous axial images were obtained from the base of the skull through the vertex without intravenous contrast. RADIATION DOSE REDUCTION: This exam was performed according to the departmental dose-optimization program which includes automated exposure control, adjustment of the mA and/or kV according to patient size and/or use of iterative reconstruction technique. COMPARISON:  January 31, 2022 FINDINGS: Brain: There is mildly age advanced cerebral atrophy with widening of the extra-axial spaces and ventricular dilatation. There are areas of decreased attenuation within the white matter tracts of the supratentorial brain, consistent with microvascular disease changes. Vascular: No hyperdense vessel or unexpected calcification. Skull: Normal. Negative for fracture or focal lesion. Sinuses/Orbits: No acute finding. Other: A Kyree Adriano partially calcified soft tissue nodule is seen within the scalp near the  vertex on the left. IMPRESSION: 1. No acute intracranial abnormality. Electronically Signed   By: Aram Candela M.D.   On: 03/26/2023 21:13   DG Chest 2 View Result Date: 03/26/2023 CLINICAL DATA:  Chest pain EXAM: CHEST - 2 VIEW COMPARISON:  Chest x-ray 04/12/2016 FINDINGS: The heart size and mediastinal contours are within normal limits. Both lungs are clear. The visualized skeletal structures are unremarkable. IMPRESSION: No active cardiopulmonary disease. Electronically Signed   By: Darliss Cheney M.D.   On: 03/26/2023 20:54    Procedures Procedures    Medications Ordered in ED Medications  potassium chloride (KLOR-CON) packet 40 mEq (40 mEq Oral Given 03/26/23 1948)  metoCLOPramide (REGLAN) injection 10 mg (10 mg Intravenous Given 03/26/23 1950)  diphenhydrAMINE (BENADRYL) injection 25 mg (25 mg Intravenous Given 03/26/23 1952)  sodium chloride 0.9 % bolus 1,000 mL (1,000 mLs Intravenous New Bag/Given 03/26/23 1950)    ED Course/ Medical Decision Making/ A&P                                 Medical Decision Making Patient is a 58 year old female, here for frequent falls at home, and hitting the back of her head, she states she has had a headache since falling about 2 days ago, she has cervical spine tenderness, and headache, no use of blood thinners, however we will obtain a head CT, given multiple repeated falls.  As well as a neck CT given tenderness to palpation.  Will also treat with Reglan, Benadryl, to help with her symptoms.  Also here for chest pain, left-sided, intermittent nature, not associated with anything, we will obtain troponins, chest x-ray, is not short of breath which is overall reassuring.  No recent surgeries, or use of hormonal products.  Amount and/or Complexity of Data Reviewed Labs: ordered.    Details: Potassium of 2.8, magnesium within normal limits Radiology: ordered.    Details: CT cervical spine, shows chronic degenerative changes, as well as no acute  findings, on CT head, her neck ECG/medicine tests:  Decision-making details documented in ED Course.    Details: Normal sinus rhythm Discussion of management or test interpretation with external provider(s): Discussed with patient, blood work is overall reassuring, she is well-appearing, not currently having chest pain, troponins negative.  Her headache has resolved after the headache medication I am wondering  if her chronic C5/C6/C7 degenerative disc disease, is causing the headaches.  She should follow-up with her neuro doc, for further evaluation.  She was discharged home with strict return precautions, and potassium, for supplementation as she was hypokalemia at 2.8, her magnesium was within normal limits however.  She was instructed to follow-up in 1 week, for recheck of her potassium. HEART: 3  Risk Prescription drug management.    Final Clinical Impression(s) / ED Diagnoses Final diagnoses:  Hypokalemia  Acute nonintractable headache, unspecified headache type  Chest pain, unspecified type    Rx / DC Orders ED Discharge Orders          Ordered    potassium chloride SA (KLOR-CON M) 20 MEQ tablet  2 times daily        03/26/23 2136              Reyanne Hussar, Harley Alto, PA 03/26/23 2143    Kit Brubacher, Harley Alto, PA 03/26/23 2144    Rozelle Logan, DO 03/26/23 2334

## 2023-03-27 ENCOUNTER — Ambulatory Visit (INDEPENDENT_AMBULATORY_CARE_PROVIDER_SITE_OTHER): Payer: Managed Care, Other (non HMO) | Admitting: Podiatry

## 2023-03-27 DIAGNOSIS — Z91198 Patient's noncompliance with other medical treatment and regimen for other reason: Secondary | ICD-10-CM

## 2023-03-27 NOTE — Progress Notes (Signed)
 1. Failure to attend appointment with reason given    Patient had recent ED visit and canceled today's appointment.

## 2023-04-19 ENCOUNTER — Telehealth: Payer: Self-pay | Admitting: Neurology

## 2023-04-19 NOTE — Telephone Encounter (Signed)
 Patient called and got a sooner appt to see aquino. She has stopped PT in the office due to the $50 copay each time. She said that's 200 a month and they dont have that to pay. She isnt working, only her husband. She wants to know about the disability paperwork. She is doing the PT at home and will soon join [employer] to do the bikes. The PT lady printed off the exercises for her to do at home

## 2023-04-19 NOTE — Telephone Encounter (Signed)
 Pt called an informed that I have faxed her disability paperwork.

## 2023-04-20 ENCOUNTER — Encounter: Payer: Self-pay | Admitting: Neurology

## 2023-04-20 ENCOUNTER — Ambulatory Visit (INDEPENDENT_AMBULATORY_CARE_PROVIDER_SITE_OTHER): Admitting: Neurology

## 2023-04-20 VITALS — BP 135/79 | HR 109 | Ht 62.0 in | Wt 133.8 lb

## 2023-04-20 DIAGNOSIS — R269 Unspecified abnormalities of gait and mobility: Secondary | ICD-10-CM | POA: Diagnosis not present

## 2023-04-20 DIAGNOSIS — R296 Repeated falls: Secondary | ICD-10-CM | POA: Diagnosis not present

## 2023-04-20 DIAGNOSIS — R292 Abnormal reflex: Secondary | ICD-10-CM

## 2023-04-20 NOTE — Progress Notes (Signed)
 NEUROLOGY FOLLOW UP OFFICE NOTE  Lindsay Rios [id number] [birth year]  HISTORY OF PRESENT ILLNESS: I had the pleasure of seeing Lindsay Rios in follow-up in the neurology clinic on 04/20/2023.  The patient was last seen 3 months ago for dizziness. She is alone in the office today. Records and images were personally reviewed where available. Since her last visit, she went to PT at [facility] in January, on testing she was noted to be high risk for falls, FGA 11/30, she had impaired functional strength with 30 secs STS testing and SL calf raises, weakness in bilateral gluteal and trunk muscles, reduced strength in distal left>right limb, gait unsteady and not coordinated well with cane. She was in the ER on 03/26/23 for chest pain, found to have hypokalemia at 2.8. She also reported the frequent falls and headache. Head CT no acute changes, there was note of mildly advanced cerebral atrophy with widening of the extra-axial spaces and ventricular dilatation, microvascular changes. CT cervical spine showed moderate to marked severity endplate sclerosis, anterior osteophyte formation and posterior bony spurring at C5-6 and C6-7, with moderate severity intervertebral disc space narrowing, bilateral moderate to marked severity multilevel facet joint hypertrophy seen.  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 She was instructed to reduce Lexapro to 10mg  but reports she has continued 20mg  daily without side effects. She continues to have frequent falls, last fall was within the past month. She loses her balance a lot of times and would go down if her husband does not grab her. She has bladder and bowel incontinence, medications help but she still has accidents and wears pads. She has some bilateral hip pain from the falls, left  more than right. She denies frequent headaches. No difficulty swallowing, sometimes she gets choked on water. She denies any lightheaded or spinning sensations. When she gets hot, she gets clammy and sweaty, and would get more off balance. PT is cost-prohibitive, she does the home exercises and plans to join [employer] for leg strengthening.     History on Initial Assessment 07/12/2022: This is a 58 year old left-handed woman with a history of hypertension, DM2, hyperlipidemia, breast cancer, OSA on CPAP, osteopenia, cervical spondylosis with myelopathy and radiculopathy, ADD, presenting for second opinion regarding dizziness. She was evaluated by neurologist Dr. Terrace Arabia in January 2024 for similar symptoms. She reports symptoms started a couple of years ago but have gotten worse. She describes difficulty with balance, she has to hold on to walls when getting up. When she falls, she would fall backwards and loses strength in both legs and arms. When supine, the dizziness may have a spinning component. If she turns or moves around a lot, there is also some spinning. She had seen ENT and was told it is not her inner ear. When she stood up, her gait is off. She describes a constant sensation of dizziness but worse when she stands up and feels she cannot walk. She states she falls constantly. She bent to pick up a dropped spoon last month and fell backwards, hitting her head on the cabinet. She cannot wear any type of heels due to balance issues. If she touches something with her foot, it may throw her off balance. She denies any presyncopal sensation, no loss of consciousness. No staring episodes or confusion. No headaches, diplopia,  dysarthria/dysphagia (but has noticed she get choked more when drinking from a straw), hearing loss, or tinnitus. She has numbness in her fingers and toes. HbA1c last month was 6.9. She has bowel and bladder issues and has taken some of her father's Myrbetriq which helped her some. She  also takes Imodium every other day. No perineal numbness. She has neck and back pain. Neck injections did not help. She did PT twice a week a year ago with [facility] but they were charging $50 for each visit and it was cost prohibitive. She was started on Lexapro in January 2023, she has not noticed much change with mood.  Diagnostic Data available: Vestibular testing July 2023 at the [facility] showed no evidence of peripheral vestibular deficit, no evidence of benign positional vertigo.  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   [facility]NCV at [facility] in 10/2021 which was mildly abnormal with evidence of chronic right C7-8 radiculopathy, no evidence of active process. There is also evidence of mild bilateral L4 radiculopathy, no evidence of large fiber peripheral neuropathy or focal neuropathy.   She was seen by neurosurgeon Dr. Conchita Paris in 09/2021 for C5-6 broad-based disc osteophyte complex with right greater than left neuroforaminal stenosis, mild spinal stenosis. Not a surgical candidate.  At Dr. Zannie Cove office, she was noted to have orthostatic tachycardia and hypotension, symptomatic after standing. Per notes, Sitting down 129/88, H 118; Standing up 106/69, HR 123; Standing up one minute 115/74, HR 141.  MRI thoracic and lumbar spine 01/2022: spinal cord appears normal. There were degenerative changes at several levels, mild to moderate left lateral recess stenosis and borderline spinal stenosis at L4-5.   CT cervical spine 01/2022: Degenerative disc disease is greatest at C5-6 and C6-7.There is a mild bilateral foraminal stenosis at C5-6.  Skin biopsy 07/2022: significantly reduced epidermal nerve fiber density on the left calf, consistent with small fiber neuropathy  PAST MEDICAL HISTORY: Past Medical History:  Diagnosis Date   ADD (attention deficit disorder)     Allergy    Depression    Ejection fraction    Gestational diabetes 1999; 2002   History of chicken pox    Human papilloma virus    High Risk    Hyperlipidemia    Hypertension    Numbness and tingling    Osteopenia    Pneumonia 2012   Sleep apnea with use of continuous positive airway pressure (CPAP)    Type II diabetes mellitus (HCC)    "borderline; I'm on Metformin"    MEDICATIONS: Current Outpatient Medications on File Prior to Visit  Medication Sig Dispense Refill   amphetamine-dextroamphetamine (ADDERALL XR) 20 MG 24 hr capsule Take 20 mg by mouth daily.     aspirin 81 MG chewable tablet Chew 81 mg by mouth daily.     cetirizine (ZYRTEC) 10 MG tablet Take 10 mg by mouth daily.     Cholecalciferol (PA VITAMIN D-3 GUMMY PO) Chew one (1) gummy by mouth daily.     clonazePAM (KLONOPIN) 1 MG tablet Take 1 mg by mouth at bedtime.     empagliflozin (JARDIANCE) 25 MG TABS tablet Take 25 mg by mouth daily.     escitalopram (LEXAPRO) 20 MG tablet Take 20 mg by mouth daily. Take 1/2 tablet every night     ezetimibe (ZETIA) 10 MG tablet Take 1 tablet by mouth daily.     lisinopril-hydrochlorothiazide (ZESTORETIC) 20-25 MG tablet  Take 0.5 tablets by mouth daily. Take 1/2 tablet daily     mirabegron ER (MYRBETRIQ) 50 MG TB24 tablet Take 50 mg by mouth daily.     Multiple Vitamins-Minerals (ALIVE WOMENS GUMMY PO) Chew one (1) gummy by mouth daily.     OZEMPIC, 0.25 OR 0.5 MG/DOSE, 2 MG/3ML SOPN Inject 0.25 mg into the skin once a week.     rosuvastatin (CRESTOR) 40 MG tablet Take 40 mg by mouth daily.     venlafaxine XR (EFFEXOR XR) 75 MG 24 hr capsule Take 1 capsule at night 90 capsule 3   VIBERZI 75 MG TABS Take 1 tablet by mouth daily.     Current Facility-Administered Medications on File Prior to Visit  Medication Dose Route Frequency Provider Last Rate Last Admin   0.9 %  sodium chloride infusion  500 mL Intravenous Once Meryl Dare, MD        ALLERGIES: Allergies   Allergen Reactions   Other Nausea Only and Other (See Comments)    REACTION: swelling under skin after shots Treseba    Sulfa Antibiotics Nausea Only    REACTION: nausea   Exenatide     Other reaction(s): dizzy, upset stomach   Metformin Hcl     Other reaction(s): GI Upset (intolerance), stomach upset    FAMILY HISTORY: Family History  Problem Relation Age of Onset   Hyperlipidemia Mother    Hyperlipidemia Father    Hypertension Father    Diabetes Father    Heart disease Father    Stroke Father    Heart disease Brother 76       Myocardial Infarction   Cancer Maternal Grandmother        Colon Cancer   Alzheimer's disease Maternal Grandmother    Colon cancer Maternal Grandmother    Breast cancer Paternal Aunt    Esophageal cancer Neg Hx    Stomach cancer Neg Hx    Rectal cancer Neg Hx     SOCIAL HISTORY: Social History   Socioeconomic History   Marital status: Married    Spouse name: Arlys John (Separated)    Number of children: 2   Years of education: 14   Highest education level: Not on file  Occupational History   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   Occupation: Full- Time Student    Education: [employer] (CMA Program)    Tobacco Use: She has never smoked.     Alcohol Use:  Rarely   Drug Use:  None   Diet:  Regular   Exercise:  None   Hobbies: Shopping      Left handed    Social Drivers of  Corporate investment banker Strain: Not on file  Food Insecurity: Not on file  Transportation Needs: Not on file  Physical Activity: Not  on file  Stress: Not on file  Social Connections: Not on file  Intimate Partner Violence: Not on file     PHYSICAL EXAM: Vitals:   04/20/23 0820  BP: 135/79  Pulse: (!) 109  SpO2: 98%   General: No acute distress Head:  Normocephalic/atraumatic Skin/Extremities: No rash, no edema Neurological Exam: alert and awake. No aphasia or dysarthria. Fund of knowledge is appropriate.  Attention and concentration are normal. Cranial nerves: Pupils equal, round. Extraocular movements intact with no nystagmus. Visual fields full.  No facial asymmetry.  Tongue midline. Motor: Bulk and tone normal, muscle strength 5/5 throughout except for 4/5 bilateral toe extensors, no pronator drift. Sensation intact to all modalities on both UE, intact to cold, vibration sense on both LE, decreased pin over toes bilaterally. Reflexes brisk +2 throughout with hyperactive pectoralis reflex bilaterally, negative Hoffman sign, no clonus. Toes upgoing bilaterally.  Finger to nose testing intact.  Gait slow and cautious without cane, no ataxia. + sway on Romberg testing   IMPRESSION: This is a 58 yo LH woman with a history of hypertension, DM2, hyperlipidemia, breast cancer, OSA on CPAP, osteopenia, cervical spondylosis and radiculopathy, ADD, neuropathy, with dizziness and frequent falls. Etiology unclear, prior imaging was unrevealing, EMG/NCV in 2023 showed radiculopathy which would also not explain symptoms. Skin biopsy did show small fiber neuropathy. Recent cervical CT showed moderate to severe endplate sclerosis and joint hypertrophy, new from prior imaging. She continues to report frequent falls, bowel and bladder dysfunction. She is hyperreflexic today. MRI cervical spine with and without contrast will be ordered to assess for myelopathy. Continue home exercise program. She was  started on Venlafaxine for possible PPPD, it has not helped with "dizziness" but has helped with mood, continue all medications. Follow-up in 4 months, call for any changes.    Thank you for allowing me to participate in her care.  Please do not hesitate to call for any questions or concerns.    Patrcia Dolly, M.D.   CC: Milus Height, [place]

## 2023-04-20 NOTE — Patient Instructions (Signed)
 Good to see you.  Schedule MRI cervical spine with and without contrast  2. We may do another nerve test if cervical MRI is unrevealing  3. Continue with home exercises, agree with [employer]  4. Use cane at all times  5. Continue all your medications  6. Follow-up in 4 months, call for any changes

## 2023-04-30 ENCOUNTER — Ambulatory Visit: Payer: Managed Care, Other (non HMO) | Admitting: Gastroenterology

## 2023-04-30 ENCOUNTER — Encounter: Payer: Self-pay | Admitting: Gastroenterology

## 2023-04-30 VITALS — BP 98/62 | HR 77 | Temp 97.5°F | Resp 19 | Ht 62.0 in | Wt 135.0 lb

## 2023-04-30 DIAGNOSIS — D122 Benign neoplasm of ascending colon: Secondary | ICD-10-CM

## 2023-04-30 DIAGNOSIS — K640 First degree hemorrhoids: Secondary | ICD-10-CM

## 2023-04-30 DIAGNOSIS — R634 Abnormal weight loss: Secondary | ICD-10-CM

## 2023-04-30 DIAGNOSIS — Z800 Family history of malignant neoplasm of digestive organs: Secondary | ICD-10-CM | POA: Diagnosis not present

## 2023-04-30 DIAGNOSIS — K580 Irritable bowel syndrome with diarrhea: Secondary | ICD-10-CM

## 2023-04-30 MED ORDER — SODIUM CHLORIDE 0.9 % IV SOLN: 500.0000 mL | Freq: Once | INTRAVENOUS | Status: DC

## 2023-04-30 NOTE — Progress Notes (Signed)
 Pt's states no medical or surgical changes since previsit or office visit.

## 2023-04-30 NOTE — Progress Notes (Signed)
 Sedate, gd SR, tolerated procedure well, VSS, report to RN

## 2023-04-30 NOTE — Patient Instructions (Signed)
-

## 2023-04-30 NOTE — Progress Notes (Signed)
 Called to room to assist during endoscopic procedure.  Patient ID and intended procedure confirmed with present staff. Received instructions for my participation in the procedure from the performing physician.

## 2023-04-30 NOTE — Op Note (Signed)
 [facility] Patient Name: Lindsay Rios Procedure Date: 04/30/2023 8:23 AM MRN: [id number] Endoscopist: Lynann Bologna , MD, [id number] Age: 58 Referring MD:  Date of Birth: 1965-07-16 Gender: Female Account #: [id number] Procedure:                Colonoscopy Indications:              #1. IBS-D. Doing better on Viberzi                           #2. FH CRC (MGM) Medicines:                Monitored Anesthesia Care Procedure:                Pre-Anesthesia Assessment:                           - Prior to the procedure, a History and Physical                            was performed, and patient medications and                            allergies were reviewed. The patient's tolerance of                            previous anesthesia was also reviewed. The risks                            and benefits of the procedure and the sedation                            options and risks were discussed with the patient.                            All questions were answered, and informed consent                            was obtained. Prior Anticoagulants: The patient has                            taken no anticoagulant or antiplatelet agents. ASA                            Grade Assessment: II - A patient with mild systemic                            disease. After reviewing the risks and benefits,                            the patient was deemed in satisfactory condition to                            undergo the procedure.  After obtaining informed consent, the colonoscope                            was passed under direct vision. Throughout the                            procedure, the patient's blood pressure, pulse, and                            oxygen saturations were monitored continuously. The                            Olympus Scope [id number] was introduced through the                            anus and advanced to the 5 cm into the ileum. The                             colonoscopy was performed without difficulty. The                            patient tolerated the procedure well. The quality                            of the bowel preparation was adequate to identify                            polyps. Some retained solid vegetable material and                            stool. Nevertheless, aggressive suctioning                            aspiration was performed. Overall over 90 to 95% of                            the colonic mucosa visualized satisfactorily. Exam                            was adequate. The terminal ileum, ileocecal valve,                            appendiceal orifice, and rectum were photographed. Scope In: 8:27:46 AM Scope Out: 8:45:30 AM Scope Withdrawal Time: 0 hours 12 minutes 59 seconds  Total Procedure Duration: 0 hours 17 minutes 44 seconds  Findings:                 A 2 mm polyp was found in the proximal ascending                            colon. The polyp was sessile. The polyp was removed  with a cold biopsy forceps. Resection and retrieval                            were complete.                           The colon (entire examined portion) appeared                            normal. Biopsies for histology were taken with a                            cold forceps from the entire colon for evaluation                            of microscopic colitis.                           Non-bleeding internal hemorrhoids were found during                            retroflexion. The hemorrhoids were small and Grade                            I (internal hemorrhoids that do not prolapse).                           The terminal ileum appeared normal.                           The exam was otherwise without abnormality on                            direct and retroflexion views. Complications:            No immediate complications. Estimated Blood Loss:     Estimated blood loss:  none. Impression:               - One 2 mm polyp in the proximal ascending colon,                            removed with a cold biopsy forceps. Resected and                            retrieved.                           - The entire examined colon is normal. Biopsied.                           - Non-bleeding internal hemorrhoids.                           - The examined portion of the ileum was normal.                           -  The examination was otherwise normal on direct                            and retroflexion views. Recommendation:           - Patient has a contact number available for                            emergencies. The signs and symptoms of potential                            delayed complications were discussed with the                            patient. Return to normal activities tomorrow.                            Written discharge instructions were provided to the                            patient.                           - Resume previous diet.                           - Continue present medications.                           - Await pathology results.                           - Repeat colonoscopy for surveillance based on                            pathology results.                           - I have reviewed her weight. Her weight has been                            stable over the last 3 months. If there is any                            weight loss, further workup.                           - The findings and recommendations were discussed                            with the patient's family. Lynann Bologna, MD 04/30/2023 8:50:49 AM This report has been signed electronically.

## 2023-04-30 NOTE — Progress Notes (Signed)
 Chief Complaint: IBS  Referring Provider:  Milus Height, PA      ASSESSMENT AND PLAN;   #1. IBS-D. Doing better on Viberzi  #2. FH CRC (MGM)-patient quite concerned about colon cancer.  Plan: -Continue viberzi -Colon today   Discussed risks & benefits of colonoscopy. Risks including rare perforation req laparotomy, bleeding after bx/polypectomy req blood transfusion, rarely missing neoplasms, risks of anesthesia/sedation, rare risk of damage to internal organs. Benefits outweigh the risks. Patient agrees to proceed. All the questions were answered. Pt consents to proceed.  HPI:    Lindsay Rios is a 58 y.o. female  With multiple medical problems including DM (on ozempic), HLD, HTN, ADD, OSA, anxiety/depression, postural hypotension, peripheral neuropathy, gait abnormality (has appointment with neurology)  with functional diarrhea and pelvic floor dysfunction.    Very much concerned about colon cancer-due to family history.  Maternal grandmother with colon cancer at a earlier age.   Longstanding H/O postprandial diarrhea. 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 The patient has been on Viberzi, which has helped control the diarrhea, but it has also led to constipation. They have tried to manage this by reducing the dosage and frequency of the medication.  The patient has tried multiple medications for their diarrhea in the past, including Bentyl and Imodium, but Viberzi has been the most effective. However, they express concern about the constipation and fear of having accidents in public, which has limited their social activities.  The patient also reports urinary incontinence and balance issues, which have led to multiple falls. They are scheduled to see a specialist for these issues. They express frustration with their health, stating that they feel these issues should not be  happening at their age.  The patient has a family history of colon cancer and celiac disease. They have not noticed any blood in their stool, but they do report passing small, hard stools when constipated. They have lost weight recently, but they are also on Ozempic for their diabetes.  The patient's diet varies, but they have noticed that fried foods and carbonated drinks tend to exacerbate their diarrhea. They try to avoid these foods and mostly drink water. They have not noticed a significant difference in their symptoms when eating at home versus eating out.  The patient is also on medication for anxiety and depression, which they believe may be contributing to their balance issues. They are currently seeking disability due to their health issues.         Son with celiac  Wt Readings from Last 3 Encounters:  04/30/23 135 lb (61.2 kg)  04/20/23 133 lb 12.8 oz (60.7 kg)  02/15/23 135 lb (61.2 kg)   Past GI workup:  Colonoscopy 03/14/2017 ([facility]) - The entire examined colon is normal on direct and retroflexion views. Random biopsies obtained. - The examined portion of the ileum was normal. -Bx: neg for microscopic colitis -Repeat 10 years.  Earlier, if with any problems.  Anal manometry 03/2015 for fecal incontinence -Weak external sphincter -Dyssynergic defecation -Refer to pelvic PT  Colonoscopy 01/2012: neg. Rpt 5 yrs      Past Medical History:  Diagnosis Date   ADD (attention deficit disorder)    Allergy    Depression    Ejection fraction    Gestational diabetes 1999; 2002   History of chicken pox    Human papilloma virus    High Risk  Hyperlipidemia    Hypertension    Numbness and tingling    Osteopenia    Pneumonia 2012   Sleep apnea with use of continuous positive airway pressure (CPAP)    Type II diabetes mellitus (HCC)    "borderline; I'm on Metformin"    Past Surgical History:  Procedure Laterality Date   ANAL RECTAL MANOMETRY N/A 03/17/2015    Procedure: ANO RECTAL MANOMETRY;  Surgeon: Napoleon Form, MD;  Location: [facility];  Service: Endoscopy;  Laterality: N/A;   BREAST LUMPECTOMY WITH RADIOACTIVE SEED LOCALIZATION Right 06/05/2019   Procedure: [name];  Surgeon: Abigail Miyamoto, MD;  Location: [facility];  Service: General;  Laterality: Right;   BREAST RECONSTRUCTION WITH PLACEMENT OF TISSUE EXPANDER AND FLEX HD (ACELLULAR HYDRATED DERMIS) Bilateral 04/19/2020   Procedure: IMMEDIATE BILATERAL BREAST RECONSTRUCTION WITH PLACEMENT OF TISSUE EXPANDER AND FLEX HD (ACELLULAR HYDRATED DERMIS);  Surgeon: Peggye Form, DO;  Location: [facility];  Service: Plastics;  Laterality: Bilateral;   LEFT HEART CATHETERIZATION WITH CORONARY ANGIOGRAM N/A 12/22/2013   Procedure: LEFT HEART CATHETERIZATION WITH CORONARY ANGIOGRAM;  Surgeon: Corky Crafts, MD;  Location: [facility];  Service: Cardiovascular;  Laterality: N/A;   NIPPLE SPARING MASTECTOMY Bilateral 04/19/2020   Procedure: BILATERAL NIPPLE SPARING MASTECTOMY;  Surgeon: Abigail Miyamoto, MD;  Location:  SURGERY CENTER;  Service: General;  Laterality: Bilateral;   REMOVAL OF TISSUE EXPANDER AND PLACEMENT OF IMPLANT Bilateral 08/12/2020   Procedure: REMOVAL OF TISSUE EXPANDER AND PLACEMENT OF IMPLANT;  Surgeon: Peggye Form, DO;  Location:  SURGERY CENTER;  Service: Plastics;  Laterality: Bilateral;  90 min   SHOULDER ARTHROSCOPY W/ ROTATOR CUFF REPAIR Right 1990's   VAGINAL HYSTERECTOMY  2013   HPV/Cervical Dysplasia; partial    Family History  Problem Relation Age of Onset   Hyperlipidemia Mother    Hyperlipidemia Father    Hypertension Father    Diabetes Father    Heart disease Father    Stroke Father    Heart disease Brother 70       Myocardial Infarction   Cancer Maternal Grandmother        Colon Cancer   Alzheimer's disease Maternal Grandmother     Colon cancer Maternal Grandmother    Breast cancer Paternal Aunt    Esophageal cancer Neg Hx    Stomach cancer Neg Hx    Rectal cancer Neg Hx     Social History   Tobacco Use   Smoking status: Never   Smokeless tobacco: Never  Vaping Use   Vaping status: Never Used  Substance Use Topics   Alcohol use: No    Alcohol/week: 0.0 standard drinks of alcohol    Comment: rare   Drug use: No    Current Outpatient Medications  Medication Sig Dispense Refill   amphetamine-dextroamphetamine (ADDERALL XR) 20 MG 24 hr capsule Take 20 mg by mouth daily.     aspirin 81 MG chewable tablet Chew 81 mg by mouth daily.     cetirizine (ZYRTEC) 10 MG tablet Take 10 mg by mouth daily.     clonazePAM (KLONOPIN) 1 MG tablet Take 1 mg by mouth at bedtime.     empagliflozin (JARDIANCE) 25 MG TABS tablet Take 25 mg by mouth daily.     escitalopram (LEXAPRO) 20 MG tablet Take 20 mg by mouth daily. Take 1/2 tablet every night     ezetimibe (ZETIA) 10 MG tablet Take 1  tablet by mouth daily.     lisinopril-hydrochlorothiazide (ZESTORETIC) 20-25 MG tablet Take 0.5 tablets by mouth daily. Take 1/2 tablet daily     mirabegron ER (MYRBETRIQ) 50 MG TB24 tablet Take 50 mg by mouth daily.     rosuvastatin (CRESTOR) 40 MG tablet Take 40 mg by mouth daily.     venlafaxine XR (EFFEXOR XR) 75 MG 24 hr capsule Take 1 capsule at night 90 capsule 3   Cholecalciferol (PA VITAMIN D-3 GUMMY PO) Chew one (1) gummy by mouth daily.     Multiple Vitamins-Minerals (ALIVE WOMENS GUMMY PO) Chew one (1) gummy by mouth daily.     OZEMPIC, 0.25 OR 0.5 MG/DOSE, 2 MG/3ML SOPN Inject 0.25 mg into the skin once a week.     VIBERZI 75 MG TABS Take 1 tablet by mouth daily.     Current Facility-Administered Medications  Medication Dose Route Frequency Provider Last Rate Last Admin   0.9 %  sodium chloride infusion  500 mL Intravenous Once Meryl Dare, MD       0.9 %  sodium chloride infusion  500 mL Intravenous Once Lynann Bologna,  MD        Allergies  Allergen Reactions   Exenatide Other (See Comments)    Other reaction(s): dizzy, upset stomach   Metformin Hcl Other (See Comments)    Other reaction(s): GI Upset (intolerance), stomach upset   Other Nausea Only and Other (See Comments)    REACTION: swelling under skin after shots Treseba    Sulfa Antibiotics Nausea Only    REACTION: nausea    Review of Systems:  Constitutional: Denies fever, chills, diaphoresis, appetite change and has fatigue.  HEENT: Has allergies Respiratory: Denies SOB, DOE, cough, chest tightness,  and wheezing.   Cardiovascular: Denies chest pain, palpitations and leg swelling.  Genitourinary: Denies dysuria, urgency, frequency, hematuria, flank pain and difficulty urinating.  Musculoskeletal: Denies myalgias, back pain, joint swelling, arthralgias and has gait problem.  Skin: No rash.  Neurological: Denies dizziness, seizures, syncope, weakness, light-headedness, numbness and headaches.  Hematological: Denies adenopathy. Easy bruising, personal or family bleeding history  Psychiatric/Behavioral: Has anxiety or depression     Physical Exam:    BP 117/76   Pulse 80   Temp (!) 97.5 F (36.4 C) (Skin)   Ht 5\' 2"  (1.575 m)   Wt 135 lb (61.2 kg)   SpO2 97%   BMI 24.69 kg/m  Wt Readings from Last 3 Encounters:  04/30/23 135 lb (61.2 kg)  04/20/23 133 lb 12.8 oz (60.7 kg)  02/15/23 135 lb (61.2 kg)   Constitutional:  Well-developed, in no acute distress. Psychiatric: Normal mood and affect. Behavior is normal. HEENT: Pupils normal.  Conjunctivae are normal. No scleral icterus. Cardiovascular: Normal rate, regular rhythm. No edema Pulmonary/chest: Effort normal and breath sounds normal. No wheezing, rales or rhonchi. Abdominal: Soft, nondistended. Nontender. Bowel sounds active throughout. There are no masses palpable. No hepatomegaly. Rectal: Deferred Neurological: Alert and oriented to person place and time. Skin: Skin is  warm and dry. No rashes noted.  Data Reviewed: I have personally reviewed following labs and imaging studies  CBC:    Latest Ref Rng & Units 03/26/2023    6:51 PM 02/15/2023   10:19 AM 01/31/2022    3:05 PM  CBC  WBC 4.0 - 10.5 K/uL 8.2  6.6  8.9   Hemoglobin 12.0 - 15.0 g/dL 44.0  10.2  72.5   Hematocrit 36.0 - 46.0 % 50.6  49.8  48.4   Platelets 150 - 400 K/uL 217  205.0  229     CMP:    Latest Ref Rng & Units 03/26/2023    6:51 PM 02/15/2023   10:19 AM 01/31/2022    3:05 PM  CMP  Glucose 70 - 99 mg/dL 409  811  914   BUN 6 - 20 mg/dL 17  15  20    Creatinine 0.44 - 1.00 mg/dL 7.82  9.56  2.13   Sodium 135 - 145 mmol/L 138  141  140   Potassium 3.5 - 5.1 mmol/L 2.8  3.8  4.0   Chloride 98 - 111 mmol/L 96  100  101   CO2 22 - 32 mmol/L 29  31  27    Calcium 8.9 - 10.3 mg/dL 08.6  57.8  46.9   Total Protein 6.5 - 8.1 g/dL 7.8  7.7    Total Bilirubin 0.0 - 1.2 mg/dL 0.8  0.9    Alkaline Phos 38 - 126 U/L 70  64    AST 15 - 41 U/L 28  27    ALT 0 - 44 U/L 39  35          Edman Circle, MD 04/30/2023, 8:21 AM  Cc: Milus Height, PA

## 2023-05-01 ENCOUNTER — Telehealth: Payer: Self-pay | Admitting: *Deleted

## 2023-05-01 NOTE — Telephone Encounter (Signed)
 No answer on  follow up call. Left message.

## 2023-05-03 LAB — SURGICAL PATHOLOGY

## 2023-05-08 ENCOUNTER — Encounter: Payer: Self-pay | Admitting: Gastroenterology

## 2023-05-21 ENCOUNTER — Telehealth: Payer: Self-pay | Admitting: Neurology

## 2023-05-21 NOTE — Telephone Encounter (Signed)
 Balance issues, felt hot and now is feeling fine. Going to reach out to Pcp.

## 2023-05-21 NOTE — Telephone Encounter (Signed)
 Pt called in and left message with after hours service. Dr. Ty Gales has been trying to figure out why her balance is off. She was in [employer] and got very hot and not feeling well. She had to be held up because she was so off balance. Would like to speak with someone

## 2023-05-23 NOTE — Telephone Encounter (Signed)
 Pt called an informed The MRI is very important, so make sure patient knows not to cancel this. Dr. Ty Gales is out this week, so seeing her PCP could be a good idea if she needs evaluation sooner.

## 2023-05-23 NOTE — Telephone Encounter (Signed)
 Pt is going to [employer], she made an appointment with PCP but cancelled it, her mri is Sunday she is falling a lot, she is doing some of the vestibular therapy, when she gets hot it makes it worse, pt informed that Dr Ty Gales is out of the office

## 2023-05-23 NOTE — Telephone Encounter (Signed)
 Pt. Is doing MRI on Sunday would like to see Ty Gales ASAP about symptoms showing in prior telephone notes added to wait list

## 2023-05-24 ENCOUNTER — Encounter: Payer: Self-pay | Admitting: Neurology

## 2023-05-27 ENCOUNTER — Ambulatory Visit
Admission: RE | Admit: 2023-05-27 | Discharge: 2023-05-27 | Disposition: A | Discharge status: Home / Self Care | Source: Ambulatory Visit | Attending: Neurology

## 2023-05-27 DIAGNOSIS — R296 Repeated falls: Secondary | ICD-10-CM

## 2023-05-27 DIAGNOSIS — R269 Unspecified abnormalities of gait and mobility: Secondary | ICD-10-CM

## 2023-05-27 DIAGNOSIS — R292 Abnormal reflex: Secondary | ICD-10-CM

## 2023-05-27 MED ORDER — GADOPICLENOL 0.5 MMOL/ML IV SOLN
6.0000 mL | Freq: Once | INTRAVENOUS | Status: AC | PRN
  Administered 2023-05-27: 6 mL via INTRAVENOUS

## 2023-06-04 ENCOUNTER — Ambulatory Visit: Payer: Managed Care, Other (non HMO) | Admitting: Neurology

## 2023-06-15 ENCOUNTER — Telehealth: Payer: Self-pay | Admitting: Neurology

## 2023-06-15 NOTE — Telephone Encounter (Signed)
 Pt. Dropped off Disability paper work inbox

## 2023-06-18 ENCOUNTER — Other Ambulatory Visit

## 2023-06-18 ENCOUNTER — Ambulatory Visit: Admitting: Neurology

## 2023-06-18 ENCOUNTER — Encounter: Payer: Self-pay | Admitting: Neurology

## 2023-06-18 VITALS — BP 142/92 | HR 87 | Ht 62.0 in | Wt 137.0 lb

## 2023-06-18 DIAGNOSIS — R296 Repeated falls: Secondary | ICD-10-CM

## 2023-06-18 DIAGNOSIS — G629 Polyneuropathy, unspecified: Secondary | ICD-10-CM

## 2023-06-18 DIAGNOSIS — R2681 Unsteadiness on feet: Secondary | ICD-10-CM

## 2023-06-18 NOTE — Patient Instructions (Addendum)
 Good to see you.  Let's do bloodwork for B12, magnesium, copper , vitamin E, vitamin B1  2. Referral will be sent to [facility] for second opinion  3. Please consider using a walker due to frequent falls, continue balance exercises at home  4. Continue all your medications  5. Follow-up in 3-4 months, call for any changes

## 2023-06-18 NOTE — Progress Notes (Unsigned)
 NEUROLOGY FOLLOW UP OFFICE NOTE  Lindsay Rios [id number] 1965-09-12  HISTORY OF PRESENT ILLNESS: I had the pleasure of seeing Lindsay Rios in follow-up in the neurology clinic on 06/18/2023.  The patient was last seen 2 months ago for frequent falls and dizziness. She is alone in the office today. Records and images were personally reviewed where available.  Since her last visit, she had a cervical spine MRI with and without contrast in 05/2023 with no acute changes, there were no cord changes seen. There was mild to moderate spinal stenosis at C5-6 with severe right and mild to moderate left neural foraminal stenosis (unchanged from 2023) and mild spinal stenosis at C4-5 and C6-7 with mild to moderate left neural foraminal stenosis, progressed from 2023.   She reports that she has been falling more in the past 2 months. Around 3-4 weeks ago, she fell in the backyard and injured her left hip, unable to get up, she had to pull self up holding on the grass. Another time, she was at [employer] when she felt hot and started losing her balance, walking like she was drunk. She leaned back against the gift card rack and cards were falling off. Her husband had to hold her up. She fell in the flower beds last Tuesday, hurting her left leg. She was getting down on a cushion then fell forward while standing. She fell and bruised her left shoulder and hit the left side of her face on a brick. She falls 2-3 times a week. She has to hold on when standing, she states she is not dizzy, her balance gets off. No tremors. She denies any focal numbness/tingling/weakness except for some numbness on the toes of her left foot. She notices she is holding her head down more, if she looks up, it throws her balance off. She has neck and back pain. 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 She has tried Physical therapy with no improvement and reports it is cost-prohibitive.    History on Initial Assessment 07/12/2022: This is a 58 year old left-handed woman with a history of hypertension, DM2, hyperlipidemia, breast cancer, OSA on CPAP, osteopenia, cervical spondylosis with myelopathy and radiculopathy, ADD, presenting for second opinion regarding dizziness. She was evaluated by neurologist Dr. Gracie Lav in January 2024 for similar symptoms. She reports symptoms started a couple of years ago but have gotten worse. She describes difficulty with balance, she has to hold on to walls when getting up. When she falls, she would fall backwards and loses strength in both legs and arms. When supine, the dizziness may have a spinning component. If she turns or moves around a lot, there is also some spinning. She had seen ENT and was told it is not her inner ear. When she stood up, her gait is off. She describes a constant sensation of dizziness but worse when she stands up and feels she cannot walk. She states she falls constantly. She bent to pick up a dropped spoon last month and fell backwards, hitting her head on the cabinet. She cannot wear any type of heels due to balance issues. If she touches something with her foot, it may throw her off balance. She denies any presyncopal sensation, no loss of consciousness. No staring episodes or confusion. No headaches, diplopia, dysarthria/dysphagia (but has noticed she get choked more when drinking from a straw),  hearing loss, or tinnitus. She has numbness in her fingers and toes. HbA1c last month was 6.9. She has bowel and bladder issues and has taken some of her father's Myrbetriq which helped her some. She also takes Imodium  every other day. No perineal numbness. She has neck and back pain. Neck injections did not help. She did PT twice a week a year ago with [facility] but they were charging $50 for each visit and it was cost prohibitive. She was  started on Lexapro in January 2023, she has not noticed much change with mood.  Diagnostic Data available: Vestibular testing July 2023 at [facility] showed no evidence of peripheral vestibular deficit, no evidence of benign positional vertigo.  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   EMG/NCV at [facility] in 10/2021 which was mildly abnormal with evidence of chronic right C7-8 radiculopathy, no evidence of active process. There is also evidence of mild bilateral L4 radiculopathy, no evidence of large fiber peripheral neuropathy or focal neuropathy.   She was seen by neurosurgeon Dr. Nat Badger in 09/2021 for C5-6 broad-based disc osteophyte complex with right greater than left neuroforaminal stenosis, mild spinal stenosis. Not a surgical candidate.  At Dr. Torrance Freestone office, she was noted to have orthostatic tachycardia and hypotension, symptomatic after standing. Per notes, Sitting down 129/88, H 118; Standing up 106/69, HR 123; Standing up one minute 115/74, HR 141.  MRI thoracic and lumbar spine 01/2022: spinal cord appears normal. There were degenerative changes at several levels, mild to moderate left lateral recess stenosis and borderline spinal stenosis at L4-5.   CT cervical spine 01/2022: Degenerative disc disease is greatest at C5-6 and C6-7.There is a mild bilateral foraminal stenosis at C5-6.  Cervical spine MRI with and without contrast in 05/2023 with no acute changes, there were no cord changes seen. There was mild to moderate spinal stenosis at C5-6 with severe right and mild to moderate left neural foraminal stenosis (unchanged from 2023) and mild spinal stenosis at C4-5 and C6-7 with mild to moderate left neural foraminal stenosis, progressed from 2023.   Skin biopsy 07/2022: significantly reduced epidermal nerve fiber density on the left calf,  consistent with small fiber neuropathy  PAST MEDICAL HISTORY: Past Medical History:  Diagnosis Date   ADD (attention deficit disorder)    Allergy    Depression    Ejection fraction    Gestational diabetes 1999; 2002   History of chicken pox    Human papilloma virus    High Risk    Hyperlipidemia    Hypertension    Numbness and tingling    Osteopenia    Pneumonia 2012   Sleep apnea with use of continuous positive airway pressure (CPAP)    Type II diabetes mellitus (HCC)    "borderline; I'm on Metformin "    MEDICATIONS: Current Outpatient Medications on File Prior to Visit  Medication Sig Dispense Refill   amphetamine-dextroamphetamine (ADDERALL XR) 20 MG 24 hr capsule Take 20 mg by mouth daily.     aspirin  81 MG chewable tablet Chew 81 mg by mouth daily.     cetirizine (ZYRTEC) 10 MG tablet Take 10 mg by mouth daily.     Cholecalciferol (PA VITAMIN D -3 GUMMY PO) Chew one (1) gummy by mouth daily.     clonazePAM (KLONOPIN) 1 MG tablet Take 1 mg by mouth at bedtime.     empagliflozin  (JARDIANCE ) 25 MG TABS tablet  Take 25 mg by mouth daily.     escitalopram (LEXAPRO) 20 MG tablet Take 20 mg by mouth daily. Take 1/2 tablet every night     ezetimibe  (ZETIA ) 10 MG tablet Take 1 tablet by mouth daily.     lisinopril -hydrochlorothiazide  (ZESTORETIC) 20-25 MG tablet Take 1 tablet by mouth daily.     Magnesium Citrate (MAGNESIUM GUMMIES PO) Take by mouth.     mirabegron ER (MYRBETRIQ) 50 MG TB24 tablet Take 50 mg by mouth daily.     Multiple Vitamins-Minerals (ALIVE WOMENS GUMMY PO) Chew one (1) gummy by mouth daily.     OZEMPIC, 0.25 OR 0.5 MG/DOSE, 2 MG/3ML SOPN Inject 0.25 mg into the skin once a week.     rosuvastatin  (CRESTOR ) 40 MG tablet Take 40 mg by mouth daily.     venlafaxine  XR (EFFEXOR  XR) 75 MG 24 hr capsule Take 1 capsule at night 90 capsule 3   VIBERZI 75 MG TABS Take 1 tablet by mouth daily.     Current Facility-Administered Medications on File Prior to Visit   Medication Dose Route Frequency Provider Last Rate Last Admin   0.9 %  sodium chloride  infusion  500 mL Intravenous Once Asencion Blacksmith, MD        ALLERGIES: Allergies  Allergen Reactions   Exenatide Other (See Comments)    Other reaction(s): dizzy, upset stomach   Metformin  Hcl Other (See Comments)    Other reaction(s): GI Upset (intolerance), stomach upset   Other Nausea Only and Other (See Comments)    REACTION: swelling under skin after shots Treseba    Sulfa Antibiotics Nausea Only    REACTION: nausea    FAMILY HISTORY: Family History  Problem Relation Age of Onset   Hyperlipidemia Mother    Hyperlipidemia Father    Hypertension Father    Diabetes Father    Heart disease Father    Stroke Father    Heart disease Brother 35       Myocardial Infarction   Cancer Maternal Grandmother        Colon Cancer   Alzheimer's disease Maternal Grandmother    Colon cancer Maternal Grandmother    Breast cancer Paternal Aunt    Esophageal cancer Neg Hx    Stomach cancer Neg Hx    Rectal cancer Neg Hx     SOCIAL HISTORY: Social History   Socioeconomic History   Marital status: Married    Spouse name: Polly Brink (Separated)    Number of children: 2   Years of education: 14   Highest education level: Not on file  Occupational History   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   Occupation: Full- Time Student     Education: [employer] (CMA Program)    Tobacco Use: She has never smoked.  Alcohol Use:  Rarely   Drug Use:  None   Diet:  Regular   Exercise:  None   Hobbies: Shopping      Left handed    Social Drivers of Corporate investment banker Strain: Not on file  Food Insecurity: Not on file  Transportation Needs: Not on file  Physical Activity: Not on file  Stress: Not on file  Social Connections: Not on file  Intimate Partner Violence: Not on file     PHYSICAL EXAM: Vitals:   06/18/23 0938  BP: (!) 142/92  Pulse: 87  SpO2: 96%   General: No acute distress Head:  Normocephalic/atraumatic Skin/Extremities: No rash, no edema Neurological Exam: alert and awake. No aphasia or dysarthria. Fund of knowledge is appropriate.  Attention and concentration are normal.   Cranial nerves: Pupils equal, round. Extraocular movements intact with no nystagmus. Visual fields full.  No facial asymmetry.  Motor: Bulk and tone normal, no cogwheeling, muscle strength 5/5 throughout with no pronator drift. Reflexes +2 throughout except for absent ankle jerks. Finger to nose testing intact.  Gait slow and cautious, wide-based, no ataxia. No postural instability. Romberg +. No tremors, good finger taps and RAMs.    IMPRESSION: This is a 58 yo LH woman with a history of hypertension, DM2, hyperlipidemia, breast cancer, OSA on CPAP, osteopenia, cervical stenosis and radiculopathy, ADD, neuropathy, with dizziness and frequent falls. Etiology unclear, she has had an extensive evaluation of MRI brain and spine imaging with no clear abnormalities to cause frequent falls. She does have small fiber neuropathy on skin biopsy, however the degree of falls is atypical. No parkinsonian signs. At this point, I recommend a second opinion from a tertiary academic center. We will do bloodwork for B12, magnesium, copper , vitamin E, vitamin B1 to complete workup. She was advised to start using a walker and continue home balance  exercises. She is on Venlafaxine  as a therapeutic trial for PPPD but has not noticed any improvement in symptom but feels it helps with mood. Continue all medications. Follow-up in 3-4 months, call for any changes.     Thank you for allowing me to participate in her care.  Please do not hesitate to call for any questions or concerns.    Rayfield Cairo, M.D.   CC: Diamond Form[place], [place]

## 2023-06-21 LAB — MAGNESIUM: Magnesium: 2.3 mg/dL (ref 1.5–2.5)

## 2023-06-21 LAB — VITAMIN E
Gamma-Tocopherol (Vit E): 1.8 mg/L (ref <4.4)
Vitamin E (Alpha Tocopherol): 12.8 mg/L (ref 5.7–19.9)

## 2023-06-21 LAB — COPPER, SERUM: Copper: 109 ug/dL (ref 70–175)

## 2023-06-21 LAB — VITAMIN B12: Vitamin B-12: 510 pg/mL (ref 200–1100)

## 2023-06-21 LAB — VITAMIN B1: Vitamin B1 (Thiamine): 10 nmol/L (ref 8–30)

## 2023-07-02 ENCOUNTER — Other Ambulatory Visit: Payer: Self-pay

## 2023-07-02 DIAGNOSIS — R2681 Unsteadiness on feet: Secondary | ICD-10-CM

## 2023-07-02 DIAGNOSIS — R269 Unspecified abnormalities of gait and mobility: Secondary | ICD-10-CM

## 2023-07-02 DIAGNOSIS — R296 Repeated falls: Secondary | ICD-10-CM

## 2023-07-03 DIAGNOSIS — Z0279 Encounter for issue of other medical certificate: Secondary | ICD-10-CM

## 2023-07-05 ENCOUNTER — Ambulatory Visit: Payer: Self-pay | Admitting: Neurology

## 2023-07-11 NOTE — Telephone Encounter (Signed)
 Pt called no answer left a voice mail per DPR that bloodwork is normal

## 2023-07-11 NOTE — Telephone Encounter (Signed)
-----   Message from Lindsay Rios sent at 07/05/2023 11:07 PM EDT ----- Pls let her know bloodwork is normal, thanks

## 2023-07-23 ENCOUNTER — Telehealth: Payer: Self-pay | Admitting: Neurology

## 2023-07-23 NOTE — Telephone Encounter (Signed)
 Patient states that we need to resend the referral to [facility] as they are saying they have not received anything from us 

## 2023-07-23 NOTE — Telephone Encounter (Signed)
 Referral refaxed to [facility]

## 2023-07-30 ENCOUNTER — Telehealth: Payer: Self-pay | Admitting: Neurology

## 2023-07-30 NOTE — Telephone Encounter (Signed)
 Pt called in stating she was referred to [facility]. She has called them and they do not have a referral. She would like it to be sent again.

## 2023-07-30 NOTE — Telephone Encounter (Signed)
 [facility] called to follow up on referral will re fax

## 2023-08-20 ENCOUNTER — Ambulatory Visit: Admitting: Neurology

## 2023-08-21 NOTE — Telephone Encounter (Signed)
 Pt called back in stating she has not gotten scheduled with [facility]. [facility] says they have the referral and they don't know wether to put her with neuromuscular or regular neurology. She wants us  to reach out to them and try and help them out.

## 2023-08-22 NOTE — Telephone Encounter (Signed)
 Pls call [facility] scheduling (can ask patient for number she called) and let them know General Neurology. Thanks

## 2023-08-23 NOTE — Telephone Encounter (Signed)
 Need to call [facility] [phone]

## 2023-08-23 NOTE — Telephone Encounter (Signed)
 Spoke to [name]--stated will get in touch w/ the patient to schedule appointment w/ General Neurology. Pt been notified.

## 2023-10-16 HISTORY — PX: ANKLE FRACTURE SURGERY: SHX122

## 2023-10-17 ENCOUNTER — Telehealth: Payer: Self-pay | Admitting: Neurology

## 2023-10-17 NOTE — Telephone Encounter (Signed)
 Pt stated loss balance and unable to put weight, just had the surgery yesterday. Pt questioning if need to reschedule appt to [facility] on 10/30/23. Please advise

## 2023-10-17 NOTE — Telephone Encounter (Signed)
 Pt called today and stated she broke her ankle and she got 7 screws in her ankle yesterday. Pt stated that she has an appt.to go to [facility] on 10-30-23 to see a Neurology. Pt wants to know do she need to keep that appt with  [facility].  Please call  her at [phone].  Thanks

## 2023-10-18 NOTE — Telephone Encounter (Signed)
 Notified pt below message and voiced understanding.

## 2023-10-18 NOTE — Telephone Encounter (Signed)
 I agree, it would be hard for them to assess her gait that way. Unfortunately she will need to reschedule, I hope the next appointment will not be too far off. Thanks

## 2023-10-19 ENCOUNTER — Ambulatory Visit: Admitting: Neurology

## 2023-12-31 ENCOUNTER — Encounter: Payer: Self-pay | Admitting: Neurology

## 2023-12-31 ENCOUNTER — Ambulatory Visit: Admitting: Neurology

## 2023-12-31 VITALS — BP 124/81 | HR 87 | Ht 62.0 in | Wt 139.2 lb

## 2023-12-31 DIAGNOSIS — R296 Repeated falls: Secondary | ICD-10-CM

## 2023-12-31 DIAGNOSIS — R2681 Unsteadiness on feet: Secondary | ICD-10-CM | POA: Diagnosis not present

## 2023-12-31 NOTE — Progress Notes (Signed)
 NEUROLOGY FOLLOW UP OFFICE NOTE  Lindsay Rios [id number] 12-23-1965  Discussed the use of AI scribe software for clinical note transcription with the patient, who gave verbal consent to proceed.  History of Present Illness I had the pleasure of seeing Lindsay Rios in follow-up in the neurology clinic on 12/31/2023.  The patient was last seen 7 months ago for frequent falls and dizziness.  She is alone in the office today. Since her last visit, she has continued to have frequent falls and balance issues. She has had extensive testing as noted below with MRI brain and spine, EMG/NCV, and skin biopsy. No spinal stenosis noted. Skin biopsy showed small fiber neuropathy. She was referred to [facility] for a second opinion but had to reschedule September appointment after a fall where she fractured her ankle, requiring surgical intervention with a plate and seven screws. She wears a boot for support when going out but can walk at home without it. She is starting physical therapy soon. She has an appointment with [facility] on 01/04/24.  She describes her dizziness as a balance issue rather than lightheadedness or spinning. She is concerned that her medications might be contributing to her balance issues. She was started on Venlafaxine  ER 75mg  at bedtime for empiric treatment of PPPD but has not noticed change in symptoms except that it did help with mood. She is also on Lexapro 10mg  at bedtime. She notes slight numbness in her left foot from the surgery, denies any tingling or significant numbness elsewhere. No issues with double vision, swallowing, or bowel and bladder function. No lightheadedness or spinning. She reports regular neck and back pain.    History on Initial Assessment 07/12/2022: This is a 58 year old left-handed woman with a history of hypertension, DM2, hyperlipidemia, breast cancer, OSA on CPAP, osteopenia, cervical spondylosis with myelopathy and radiculopathy, ADD, presenting for  second opinion regarding dizziness. She was evaluated by neurologist Dr. Onita in January 2024 for similar symptoms. She reports symptoms started a couple of years ago but have gotten worse. She describes difficulty with balance, she has to hold on to walls when getting up. When she falls, she would fall backwards and loses strength in both legs and arms. When supine, the dizziness may have a spinning component. If she turns or moves around a lot, there is also some spinning. She had seen ENT and was told it is not her inner ear. When she stood up, her gait is off. She describes a constant sensation of dizziness but worse when she stands up and feels she cannot walk. She states she falls constantly. She bent to pick up a dropped spoon last month and fell backwards, hitting her head on the cabinet. She cannot wear any type of heels due to balance issues. If she touches something with her foot, it may throw her off balance. She denies any presyncopal sensation, no loss of consciousness. No staring episodes or confusion. No headaches, diplopia, dysarthria/dysphagia (but has noticed she get choked more when drinking from a straw), hearing loss, or tinnitus. She has numbness in her fingers and toes. HbA1c last month was 6.9. She has bowel and bladder issues and has taken some of her father's Myrbetriq which helped her some. She also takes Imodium  every other day. No perineal numbness. She has neck and back pain. Neck injections did not help. She did PT twice a week a year ago with [facility] but they were charging $50 for each visit and it was cost prohibitive.  She was started on Lexapro in January 2023, she has not noticed much change with mood.  Diagnostic Data available: Vestibular testing July 2023 at the [facility] showed no evidence of peripheral vestibular deficit, no evidence of benign positional vertigo.  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   EM[facility]NCV at [facility] in 10/2021 which was mildly abnormal with evidence of chronic right C7-8 radiculopathy, no evidence of active process. There is also evidence of mild bilateral L4 radiculopathy, no evidence of large fiber peripheral neuropathy or focal neuropathy.   She was seen by neurosurgeon Dr. Lanis in 09/2021 for C5-6 broad-based disc osteophyte complex with right greater than left neuroforaminal stenosis, mild spinal stenosis. Not a surgical candidate.  At Dr. Georgianne office, she was noted to have orthostatic tachycardia and hypotension, symptomatic after standing. Per notes, Sitting down 129/88, H 118; Standing up 106/69, HR 123; Standing up one minute 115/74, HR 141.  MRI thoracic and lumbar spine 01/2022: spinal cord appears normal. There were degenerative changes at several levels, mild to moderate left lateral recess stenosis and borderline spinal stenosis at L4-5.   CT cervical spine 01/2022: Degenerative disc disease is greatest at C5-6 and C6-7.There is a mild bilateral foraminal stenosis at C5-6.  Cervical spine MRI with and without contrast in 05/2023 with no acute changes, there were no cord changes seen. There was mild to moderate spinal stenosis at C5-6 with severe right and mild to moderate left neural foraminal stenosis (unchanged from 2023) and mild spinal stenosis at C4-5 and C6-7 with mild to moderate left neural foraminal stenosis, progressed from 2023.   Skin biopsy 07/2022: significantly reduced epidermal nerve fiber density on the left calf, consistent with small fiber neuropathy  Bloodwork 05/2023 normal vitamin B1, E, copper , magnesium, B12  PAST MEDICAL HISTORY: Past Medical History:  Diagnosis Date   ADD (attention deficit disorder)    Allergy    Depression    Ejection fraction    Gestational diabetes 1999; 2002   History of chicken pox    Human papilloma virus    High Risk     Hyperlipidemia    Hypertension    Numbness and tingling    Osteopenia    Pneumonia 2012   Sleep apnea with use of continuous positive airway pressure (CPAP)    Type II diabetes mellitus (HCC)    borderline; I'm on Metformin     MEDICATIONS: Current Outpatient Medications on File Prior to Visit  Medication Sig Dispense Refill   amphetamine-dextroamphetamine (ADDERALL XR) 20 MG 24 hr capsule Take 20 mg by mouth daily.     aspirin  81 MG chewable tablet Chew 81 mg by mouth daily.     cetirizine (ZYRTEC) 10 MG tablet Take 10 mg by mouth daily.     Cholecalciferol (PA VITAMIN D -3 GUMMY PO) Chew one (1) gummy by mouth daily.     clonazePAM (KLONOPIN) 1 MG tablet Take 1 mg by mouth at bedtime.     empagliflozin  (JARDIANCE ) 25 MG TABS tablet Take 25 mg by mouth daily.     escitalopram (LEXAPRO) 20 MG tablet Take 20 mg by mouth daily. Take 1/2 tablet every night     ezetimibe  (ZETIA ) 10 MG tablet Take 1 tablet by mouth daily.     lisinopril -hydrochlorothiazide  (ZESTORETIC) 20-25 MG tablet Take 1 tablet by mouth daily.     Magnesium Citrate (MAGNESIUM GUMMIES PO) Take by mouth.  mirabegron ER (MYRBETRIQ) 50 MG TB24 tablet Take 50 mg by mouth daily.     Multiple Vitamins-Minerals (ALIVE WOMENS GUMMY PO) Chew one (1) gummy by mouth daily.     OZEMPIC, 0.25 OR 0.5 MG/DOSE, 2 MG/3ML SOPN Inject 0.25 mg into the skin once a week.     rosuvastatin  (CRESTOR ) 40 MG tablet Take 40 mg by mouth daily.     venlafaxine  XR (EFFEXOR  XR) 75 MG 24 hr capsule Take 1 capsule at night 90 capsule 3   VIBERZI 75 MG TABS Take 1 tablet by mouth daily.     Current Facility-Administered Medications on File Prior to Visit  Medication Dose Route Frequency Provider Last Rate Last Admin   0.9 %  sodium chloride  infusion  500 mL Intravenous Once Aneita Gwendlyn DASEN, MD        ALLERGIES: Allergies  Allergen Reactions   Exenatide Other (See Comments)    Other reaction(s): dizzy, upset stomach   Metformin  Hcl Other (See  Comments)    Other reaction(s): GI Upset (intolerance), stomach upset   Other Nausea Only and Other (See Comments)    REACTION: swelling under skin after shots Treseba    Sulfa Antibiotics Nausea Only    REACTION: nausea    FAMILY HISTORY: Family History  Problem Relation Age of Onset   Hyperlipidemia Mother    Hyperlipidemia Father    Hypertension Father    Diabetes Father    Heart disease Father    Stroke Father    Heart disease Brother 55       Myocardial Infarction   Cancer Maternal Grandmother        Colon Cancer   Alzheimer's disease Maternal Grandmother    Colon cancer Maternal Grandmother    Breast cancer Paternal Aunt    Esophageal cancer Neg Hx    Stomach cancer Neg Hx    Rectal cancer Neg Hx     SOCIAL HISTORY: Social History   Socioeconomic History   Marital status: Married    Spouse name: Redell (Separated)    Number of children: 2   Years of education: 14   Highest education level: Not on file  Occupational History   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   Occupation: Full- Time Student    Education: [employer] (CMA Program)    Tobacco Use: She has never smoked.     Alcohol Use:  Rarely   Drug Use:  None   Diet:  Regular   Exercise:  None   Hobbies: Shopping      Left handed    Social Drivers of Corporate Investment Banker Strain: Not on file  Food Insecurity: Not on file   Transportation Needs: Not on file  Physical Activity: Not on file  Stress: Not on file  Social Connections:  Not on file  Intimate Partner Violence: Not on file     PHYSICAL EXAM: Vitals:   12/31/23 0909  BP: 124/81  Pulse: 87  SpO2: 97%   General: No acute distress Head:  Normocephalic/atraumatic Skin/Extremities: No rash, no edema Neurological Exam: alert and awake. No aphasia or dysarthria. Fund of knowledge is appropriate.   Attention and concentration are normal.   Cranial nerves: Pupils equal, round. Extraocular movements intact with no nystagmus. Visual fields full.  No facial asymmetry.  Motor: Bulk and tone normal, muscle strength 5/5 throughout with no pronator drift. Reflexes brisk +2 throughout, ?upgoing toe on left.  Finger to nose testing intact.  Gait slow and cautious, favoring left foot post surgery. Romberg: slight sway  Assessment & Plan This is a 57 yo LH woman with a history of hypertension, DM2, hyperlipidemia, breast cancer, OSA on CPAP, osteopenia, cervical stenosis and radiculopathy, ADD, neuropathy, with dizziness and frequent falls. Etiology unclear, she has had an extensive evaluation of MRI brain and spine imaging with no clear abnormalities to cause frequent falls. She does have small fiber neuropathy on skin biopsy, however the degree of falls is atypical. No parkinsonian signs, no spinal stenosis on imaging.   She is on Venlafaxine  for empiric treatment of PPPD but has not noticed improvement in dizziness (balance is the main issue), but it has helped with mood. Advised to stop the low dose Lexapro to streamline medications, and consider increasing Venlafaxine  if mood worsens. At this point, I would recommend proceeding with second opinion at [facility] scheduled on 01/04/24. Follow-up as needed, call for any changes.     Thank you for allowing me to participate in her care.  Please do not hesitate to call for any questions or concerns.    Lindsay Rios,  M.D.   CC: Sydelle Close, [place]

## 2023-12-31 NOTE — Patient Instructions (Signed)
 Wishing you all the best. Proceed with [facility] evaluation.  Stop the low dose Escitalopram and monitor mood. If depression worsens, consider increasing Venlafaxine   2. Follow-up as needed, call for any changes

## 2024-01-28 ENCOUNTER — Telehealth: Payer: Self-pay | Admitting: Cardiology

## 2024-01-28 DIAGNOSIS — R42 Dizziness and giddiness: Secondary | ICD-10-CM

## 2024-01-28 NOTE — Telephone Encounter (Signed)
 STAT if patient feels like he/she is going to faint   1. Are you feeling dizzy, lightheaded, or faint right now? No    2. Have you passed out?  No (If yes move to .SYNCOPECHMG)   3. Do you have any other symptoms? No   4. Have you checked your HR and BP (record if available)? No

## 2024-01-28 NOTE — Telephone Encounter (Signed)
 Spoke with pt who has been having episodes of feeling off balance.  She reports having had several falls including one when she broke her ankle in September.  She denies any dizziness or syncope - just feels off balance.  Reports is some times occurs with position changes but is sporadic.  She has seen MD in [place] who r/o inner ear and has seen 2 neurologist who have not been able to determine the cause.  Neuro at [facility] per her reports advised she f/u for further evaluation with Dr Jeffrie.  She suggested that she wear a heart monitor.  Pt does not have any symptoms of palpitation or tachycardia that she feels.  Advised to of course use caution with position changes and to stay well hydrated.  Advised I will forward this information to Dr Jeffrie for his review and any new orders.  She is scheduled in January to be seen but would like to be seen sooner if possible.

## 2024-01-29 ENCOUNTER — Ambulatory Visit: Attending: Cardiology

## 2024-01-29 DIAGNOSIS — R42 Dizziness and giddiness: Secondary | ICD-10-CM

## 2024-01-29 NOTE — Progress Notes (Unsigned)
 Enrolled patient for a 14 day Zio XT  monitor to be mailed to patients home

## 2024-01-29 NOTE — Telephone Encounter (Signed)
 Please order a ZIO monitor. Thanks.   Oneil Parchment, MD  Zio ordered.  Left message on pt's VM (OK per DPR) zio has been ordered and will be mailed to her home address with instructions of it's use.  Requested she call back if any questions or concerns.

## 2024-02-12 NOTE — Telephone Encounter (Signed)
 Pt is scheduled for f/u with Dr Jeffrie 1/21.

## 2024-02-20 ENCOUNTER — Ambulatory Visit: Admitting: Cardiology

## 2024-02-22 ENCOUNTER — Other Ambulatory Visit: Payer: Self-pay

## 2024-02-22 MED ORDER — VENLAFAXINE HCL ER 75 MG PO CP24: ORAL_CAPSULE | ORAL | 1 refills | Status: AC

## 2024-03-25 ENCOUNTER — Ambulatory Visit (HOSPITAL_BASED_OUTPATIENT_CLINIC_OR_DEPARTMENT_OTHER): Admitting: Cardiology
